# Patient Record
Sex: Female | Born: 1955 | Race: White | Hispanic: No | Marital: Married | Smoking: Never smoker
Health system: Southern US, Community
[De-identification: ages and names within clinical notes are randomized; demographics above are authoritative.]

## PROBLEM LIST (undated history)

## (undated) DIAGNOSIS — K5090 Crohn's disease, unspecified, without complications: Secondary | ICD-10-CM

## (undated) DIAGNOSIS — K7682 Hepatic encephalopathy: Secondary | ICD-10-CM

## (undated) DIAGNOSIS — I10 Essential (primary) hypertension: Secondary | ICD-10-CM

## (undated) DIAGNOSIS — E119 Type 2 diabetes mellitus without complications: Secondary | ICD-10-CM

## (undated) DIAGNOSIS — K259 Gastric ulcer, unspecified as acute or chronic, without hemorrhage or perforation: Secondary | ICD-10-CM

## (undated) DIAGNOSIS — K7290 Hepatic failure, unspecified without coma: Secondary | ICD-10-CM

## (undated) DIAGNOSIS — Z9289 Personal history of other medical treatment: Secondary | ICD-10-CM

## (undated) DIAGNOSIS — D509 Iron deficiency anemia, unspecified: Secondary | ICD-10-CM

## (undated) DIAGNOSIS — K7460 Unspecified cirrhosis of liver: Secondary | ICD-10-CM

## (undated) DIAGNOSIS — F329 Major depressive disorder, single episode, unspecified: Secondary | ICD-10-CM

## (undated) DIAGNOSIS — J90 Pleural effusion, not elsewhere classified: Secondary | ICD-10-CM

## (undated) DIAGNOSIS — F419 Anxiety disorder, unspecified: Secondary | ICD-10-CM

## (undated) DIAGNOSIS — M1990 Unspecified osteoarthritis, unspecified site: Secondary | ICD-10-CM

## (undated) DIAGNOSIS — H3530 Unspecified macular degeneration: Secondary | ICD-10-CM

## (undated) DIAGNOSIS — N3010 Interstitial cystitis (chronic) without hematuria: Secondary | ICD-10-CM

## (undated) DIAGNOSIS — F32A Depression, unspecified: Secondary | ICD-10-CM

## (undated) DIAGNOSIS — F319 Bipolar disorder, unspecified: Secondary | ICD-10-CM

## (undated) DIAGNOSIS — E785 Hyperlipidemia, unspecified: Secondary | ICD-10-CM

## (undated) DIAGNOSIS — G459 Transient cerebral ischemic attack, unspecified: Secondary | ICD-10-CM

## (undated) DIAGNOSIS — R011 Cardiac murmur, unspecified: Secondary | ICD-10-CM

## (undated) DIAGNOSIS — K769 Liver disease, unspecified: Secondary | ICD-10-CM

## (undated) DIAGNOSIS — IMO0002 Reserved for concepts with insufficient information to code with codable children: Secondary | ICD-10-CM

## (undated) DIAGNOSIS — J40 Bronchitis, not specified as acute or chronic: Secondary | ICD-10-CM

## (undated) DIAGNOSIS — I639 Cerebral infarction, unspecified: Secondary | ICD-10-CM

## (undated) DIAGNOSIS — K529 Noninfective gastroenteritis and colitis, unspecified: Secondary | ICD-10-CM

## (undated) DIAGNOSIS — K900 Celiac disease: Secondary | ICD-10-CM

## (undated) HISTORY — DX: Hepatic failure, unspecified without coma: K72.90

## (undated) HISTORY — DX: Unspecified cirrhosis of liver: K74.60

## (undated) HISTORY — PX: APPENDECTOMY: SHX54

## (undated) HISTORY — DX: Hyperlipidemia, unspecified: E78.5

## (undated) HISTORY — DX: Unspecified macular degeneration: H35.30

## (undated) HISTORY — DX: Cerebral infarction, unspecified: I63.9

## (undated) HISTORY — DX: Essential (primary) hypertension: I10

## (undated) HISTORY — DX: Reserved for concepts with insufficient information to code with codable children: IMO0002

## (undated) HISTORY — DX: Bipolar disorder, unspecified: F31.9

## (undated) HISTORY — DX: Crohn's disease, unspecified, without complications: K50.90

## (undated) HISTORY — DX: Liver disease, unspecified: K76.9

## (undated) HISTORY — PX: LUMBAR LAMINECTOMY: SHX95

## (undated) HISTORY — DX: Hepatic encephalopathy: K76.82

## (undated) HISTORY — PX: OTHER SURGICAL HISTORY: SHX169

## (undated) HISTORY — DX: Unspecified osteoarthritis, unspecified site: M19.90

## (undated) HISTORY — PX: BACK SURGERY: SHX140

## (undated) HISTORY — DX: Transient cerebral ischemic attack, unspecified: G45.9

## (undated) HISTORY — DX: Gastric ulcer, unspecified as acute or chronic, without hemorrhage or perforation: K25.9

## (undated) HISTORY — DX: Noninfective gastroenteritis and colitis, unspecified: K52.9

## (undated) HISTORY — DX: Celiac disease: K90.0

## (undated) HISTORY — PX: TONSILLECTOMY AND ADENOIDECTOMY: SUR1326

## (undated) HISTORY — PX: CARPAL TUNNEL RELEASE: SHX101

---

## 1981-06-30 HISTORY — PX: TUBAL LIGATION: SHX77

## 1997-10-16 ENCOUNTER — Encounter: Admission: RE | Admit: 1997-10-16 | Discharge: 1998-01-14 | Payer: Self-pay | Admitting: Family Medicine

## 1997-11-14 ENCOUNTER — Inpatient Hospital Stay (HOSPITAL_COMMUNITY): Admission: AD | Admit: 1997-11-14 | Discharge: 1997-11-16 | Payer: Self-pay | Admitting: Psychiatry

## 1997-11-17 ENCOUNTER — Encounter (HOSPITAL_COMMUNITY): Admission: RE | Admit: 1997-11-17 | Discharge: 1997-11-20 | Payer: Self-pay | Admitting: Psychiatry

## 1998-11-05 ENCOUNTER — Ambulatory Visit (HOSPITAL_COMMUNITY): Admission: RE | Admit: 1998-11-05 | Discharge: 1998-11-05 | Payer: Self-pay | Admitting: *Deleted

## 1999-04-03 ENCOUNTER — Encounter: Payer: Self-pay | Admitting: Emergency Medicine

## 1999-04-03 ENCOUNTER — Emergency Department (HOSPITAL_COMMUNITY): Admission: EM | Admit: 1999-04-03 | Discharge: 1999-04-03 | Payer: Self-pay | Admitting: Emergency Medicine

## 2000-02-11 ENCOUNTER — Other Ambulatory Visit: Admission: RE | Admit: 2000-02-11 | Discharge: 2000-02-11 | Payer: Self-pay | Admitting: Emergency Medicine

## 2001-02-17 ENCOUNTER — Encounter: Admission: RE | Admit: 2001-02-17 | Discharge: 2001-05-18 | Payer: Self-pay | Admitting: Family Medicine

## 2001-03-16 ENCOUNTER — Encounter: Payer: Self-pay | Admitting: Family Medicine

## 2001-03-16 ENCOUNTER — Ambulatory Visit (HOSPITAL_COMMUNITY): Admission: RE | Admit: 2001-03-16 | Discharge: 2001-03-16 | Payer: Self-pay | Admitting: Family Medicine

## 2001-06-09 ENCOUNTER — Ambulatory Visit (HOSPITAL_BASED_OUTPATIENT_CLINIC_OR_DEPARTMENT_OTHER): Admission: RE | Admit: 2001-06-09 | Discharge: 2001-06-09 | Payer: Self-pay | Admitting: Urology

## 2001-07-30 ENCOUNTER — Encounter: Admission: RE | Admit: 2001-07-30 | Discharge: 2001-07-30 | Payer: Self-pay | Admitting: *Deleted

## 2001-07-30 ENCOUNTER — Encounter: Payer: Self-pay | Admitting: *Deleted

## 2001-08-02 ENCOUNTER — Encounter: Payer: Self-pay | Admitting: Internal Medicine

## 2001-08-02 ENCOUNTER — Ambulatory Visit (HOSPITAL_COMMUNITY): Admission: RE | Admit: 2001-08-02 | Discharge: 2001-08-02 | Payer: Self-pay | Admitting: *Deleted

## 2002-02-10 ENCOUNTER — Emergency Department (HOSPITAL_COMMUNITY): Admission: EM | Admit: 2002-02-10 | Discharge: 2002-02-10 | Payer: Self-pay | Admitting: Emergency Medicine

## 2002-10-05 ENCOUNTER — Ambulatory Visit (HOSPITAL_COMMUNITY): Admission: RE | Admit: 2002-10-05 | Discharge: 2002-10-05 | Payer: Self-pay | Admitting: *Deleted

## 2002-10-05 ENCOUNTER — Encounter: Payer: Self-pay | Admitting: Internal Medicine

## 2002-10-05 ENCOUNTER — Encounter (INDEPENDENT_AMBULATORY_CARE_PROVIDER_SITE_OTHER): Payer: Self-pay | Admitting: *Deleted

## 2002-10-05 ENCOUNTER — Encounter (INDEPENDENT_AMBULATORY_CARE_PROVIDER_SITE_OTHER): Payer: Self-pay

## 2002-11-07 ENCOUNTER — Encounter: Admission: RE | Admit: 2002-11-07 | Discharge: 2003-02-05 | Payer: Self-pay | Admitting: *Deleted

## 2003-08-30 HISTORY — PX: OTHER SURGICAL HISTORY: SHX169

## 2003-10-13 ENCOUNTER — Encounter: Admission: RE | Admit: 2003-10-13 | Discharge: 2003-10-13 | Payer: Self-pay | Admitting: *Deleted

## 2003-11-07 ENCOUNTER — Other Ambulatory Visit: Admission: RE | Admit: 2003-11-07 | Discharge: 2003-11-07 | Payer: Self-pay | Admitting: Family Medicine

## 2004-04-19 ENCOUNTER — Encounter: Admission: RE | Admit: 2004-04-19 | Discharge: 2004-04-19 | Payer: Self-pay | Admitting: Family Medicine

## 2004-07-30 ENCOUNTER — Ambulatory Visit (HOSPITAL_COMMUNITY): Admission: RE | Admit: 2004-07-30 | Discharge: 2004-07-30 | Payer: Self-pay | Admitting: Orthopedic Surgery

## 2004-07-30 ENCOUNTER — Ambulatory Visit (HOSPITAL_BASED_OUTPATIENT_CLINIC_OR_DEPARTMENT_OTHER): Admission: RE | Admit: 2004-07-30 | Discharge: 2004-07-30 | Payer: Self-pay | Admitting: Orthopedic Surgery

## 2004-08-01 ENCOUNTER — Encounter (HOSPITAL_COMMUNITY): Admission: RE | Admit: 2004-08-01 | Discharge: 2004-10-30 | Payer: Self-pay | Admitting: *Deleted

## 2004-08-26 ENCOUNTER — Encounter (HOSPITAL_COMMUNITY): Admission: RE | Admit: 2004-08-26 | Discharge: 2004-08-30 | Payer: Self-pay | Admitting: *Deleted

## 2004-08-29 ENCOUNTER — Ambulatory Visit (HOSPITAL_BASED_OUTPATIENT_CLINIC_OR_DEPARTMENT_OTHER): Admission: RE | Admit: 2004-08-29 | Discharge: 2004-08-29 | Payer: Self-pay | Admitting: Orthopedic Surgery

## 2004-08-29 ENCOUNTER — Ambulatory Visit (HOSPITAL_COMMUNITY): Admission: RE | Admit: 2004-08-29 | Discharge: 2004-08-29 | Payer: Self-pay | Admitting: Orthopedic Surgery

## 2004-11-26 ENCOUNTER — Ambulatory Visit: Payer: Self-pay | Admitting: Oncology

## 2004-12-13 ENCOUNTER — Encounter: Admission: RE | Admit: 2004-12-13 | Discharge: 2004-12-13 | Payer: Self-pay | Admitting: *Deleted

## 2004-12-13 ENCOUNTER — Encounter (INDEPENDENT_AMBULATORY_CARE_PROVIDER_SITE_OTHER): Payer: Self-pay | Admitting: *Deleted

## 2005-01-29 ENCOUNTER — Ambulatory Visit: Payer: Self-pay | Admitting: Internal Medicine

## 2005-02-03 ENCOUNTER — Encounter (INDEPENDENT_AMBULATORY_CARE_PROVIDER_SITE_OTHER): Payer: Self-pay | Admitting: *Deleted

## 2005-02-03 ENCOUNTER — Ambulatory Visit (HOSPITAL_COMMUNITY): Admission: RE | Admit: 2005-02-03 | Discharge: 2005-02-03 | Payer: Self-pay | Admitting: Internal Medicine

## 2005-02-12 ENCOUNTER — Ambulatory Visit: Payer: Self-pay | Admitting: Internal Medicine

## 2005-02-18 ENCOUNTER — Ambulatory Visit: Payer: Self-pay | Admitting: Internal Medicine

## 2005-02-18 ENCOUNTER — Ambulatory Visit (HOSPITAL_COMMUNITY): Admission: RE | Admit: 2005-02-18 | Discharge: 2005-02-18 | Payer: Self-pay | Admitting: Internal Medicine

## 2005-02-27 ENCOUNTER — Ambulatory Visit: Payer: Self-pay | Admitting: Oncology

## 2005-04-25 ENCOUNTER — Ambulatory Visit: Payer: Self-pay | Admitting: Oncology

## 2006-04-22 ENCOUNTER — Encounter: Admission: RE | Admit: 2006-04-22 | Discharge: 2006-04-22 | Payer: Self-pay | Admitting: Internal Medicine

## 2007-03-07 ENCOUNTER — Emergency Department (HOSPITAL_COMMUNITY): Admission: EM | Admit: 2007-03-07 | Discharge: 2007-03-07 | Payer: Self-pay | Admitting: Physician Assistant

## 2007-04-14 ENCOUNTER — Ambulatory Visit: Payer: Self-pay | Admitting: Internal Medicine

## 2007-04-17 ENCOUNTER — Ambulatory Visit (HOSPITAL_COMMUNITY): Admission: RE | Admit: 2007-04-17 | Discharge: 2007-04-17 | Payer: Self-pay | Admitting: Internal Medicine

## 2007-04-21 ENCOUNTER — Encounter: Payer: Self-pay | Admitting: Internal Medicine

## 2007-04-21 ENCOUNTER — Encounter (INDEPENDENT_AMBULATORY_CARE_PROVIDER_SITE_OTHER): Payer: Self-pay | Admitting: *Deleted

## 2007-04-21 ENCOUNTER — Ambulatory Visit: Payer: Self-pay | Admitting: Internal Medicine

## 2007-04-30 ENCOUNTER — Ambulatory Visit: Payer: Self-pay | Admitting: Oncology

## 2007-04-30 LAB — CBC WITH DIFFERENTIAL/PLATELET
BASO%: 0.8 % (ref 0.0–2.0)
EOS%: 3.6 % (ref 0.0–7.0)
HCT: 29.8 % — ABNORMAL LOW (ref 34.8–46.6)
MCH: 25.8 pg — ABNORMAL LOW (ref 26.0–34.0)
MCHC: 33.6 g/dL (ref 32.0–36.0)
NEUT%: 42.2 % (ref 39.6–76.8)
RBC: 3.88 10*6/uL (ref 3.70–5.32)
lymph#: 1.1 10*3/uL (ref 0.9–3.3)

## 2007-04-30 LAB — PROTHROMBIN TIME: INR: 1.1 (ref 0.0–1.5)

## 2007-05-05 LAB — COMPREHENSIVE METABOLIC PANEL
CO2: 27 mEq/L (ref 19–32)
Creatinine, Ser: 0.65 mg/dL (ref 0.40–1.20)
Glucose, Bld: 119 mg/dL — ABNORMAL HIGH (ref 70–99)
Total Bilirubin: 0.8 mg/dL (ref 0.3–1.2)

## 2007-05-05 LAB — VON WILLEBRAND FACTOR MULTIMER
Factor-VIII Activity: 117 % (ref 75–150)
Ristocetin-Cofactor: 146 % (ref 50–150)
Von Willebrand Multimers: NORMAL

## 2007-05-05 LAB — TRANSFERRIN RECEPTOR, SOLUABLE: Transferrin Receptor, Soluble: 46.6 nmol/L

## 2007-05-05 LAB — LACTATE DEHYDROGENASE: LDH: 114 U/L (ref 94–250)

## 2007-05-17 ENCOUNTER — Ambulatory Visit: Payer: Self-pay | Admitting: Internal Medicine

## 2007-05-18 ENCOUNTER — Encounter: Admission: RE | Admit: 2007-05-18 | Discharge: 2007-05-18 | Payer: Self-pay | Admitting: Internal Medicine

## 2007-06-16 ENCOUNTER — Ambulatory Visit: Payer: Self-pay | Admitting: Oncology

## 2007-06-18 LAB — COMPREHENSIVE METABOLIC PANEL
ALT: 25 U/L (ref 0–35)
Albumin: 4.1 g/dL (ref 3.5–5.2)
BUN: 13 mg/dL (ref 6–23)
CO2: 26 mEq/L (ref 19–32)
Calcium: 9.2 mg/dL (ref 8.4–10.5)
Chloride: 106 mEq/L (ref 96–112)
Creatinine, Ser: 0.62 mg/dL (ref 0.40–1.20)

## 2007-06-18 LAB — CBC WITH DIFFERENTIAL/PLATELET
BASO%: 0.8 % (ref 0.0–2.0)
Basophils Absolute: 0 10*3/uL (ref 0.0–0.1)
HCT: 37.2 % (ref 34.8–46.6)
HGB: 12.5 g/dL (ref 11.6–15.9)
MONO#: 0.4 10*3/uL (ref 0.1–0.9)
NEUT%: 55 % (ref 39.6–76.8)
WBC: 3.7 10*3/uL — ABNORMAL LOW (ref 3.9–10.0)
lymph#: 1.1 10*3/uL (ref 0.9–3.3)

## 2007-06-18 LAB — FERRITIN: Ferritin: 299 ng/mL — ABNORMAL HIGH (ref 10–291)

## 2007-06-18 LAB — IRON AND TIBC
%SAT: 20 % (ref 20–55)
Iron: 64 ug/dL (ref 42–145)
TIBC: 321 ug/dL (ref 250–470)
UIBC: 257 ug/dL

## 2007-06-18 LAB — LACTATE DEHYDROGENASE: LDH: 122 U/L (ref 94–250)

## 2007-08-03 ENCOUNTER — Encounter: Payer: Self-pay | Admitting: Internal Medicine

## 2007-08-04 DIAGNOSIS — M129 Arthropathy, unspecified: Secondary | ICD-10-CM | POA: Insufficient documentation

## 2007-08-04 DIAGNOSIS — IMO0002 Reserved for concepts with insufficient information to code with codable children: Secondary | ICD-10-CM

## 2007-08-04 DIAGNOSIS — E785 Hyperlipidemia, unspecified: Secondary | ICD-10-CM

## 2007-08-04 DIAGNOSIS — K900 Celiac disease: Secondary | ICD-10-CM

## 2007-08-04 DIAGNOSIS — F341 Dysthymic disorder: Secondary | ICD-10-CM

## 2007-08-04 DIAGNOSIS — I10 Essential (primary) hypertension: Secondary | ICD-10-CM

## 2007-08-04 DIAGNOSIS — E119 Type 2 diabetes mellitus without complications: Secondary | ICD-10-CM

## 2007-08-04 DIAGNOSIS — J309 Allergic rhinitis, unspecified: Secondary | ICD-10-CM | POA: Insufficient documentation

## 2007-08-04 DIAGNOSIS — R197 Diarrhea, unspecified: Secondary | ICD-10-CM | POA: Insufficient documentation

## 2007-08-04 DIAGNOSIS — K5289 Other specified noninfective gastroenteritis and colitis: Secondary | ICD-10-CM | POA: Insufficient documentation

## 2007-10-14 ENCOUNTER — Other Ambulatory Visit: Admission: RE | Admit: 2007-10-14 | Discharge: 2007-10-14 | Payer: Self-pay | Admitting: Obstetrics and Gynecology

## 2007-10-25 ENCOUNTER — Ambulatory Visit: Payer: Self-pay | Admitting: Internal Medicine

## 2008-01-25 ENCOUNTER — Ambulatory Visit (HOSPITAL_BASED_OUTPATIENT_CLINIC_OR_DEPARTMENT_OTHER): Admission: RE | Admit: 2008-01-25 | Discharge: 2008-01-26 | Payer: Self-pay | Admitting: Orthopedic Surgery

## 2008-01-25 HISTORY — PX: OTHER SURGICAL HISTORY: SHX169

## 2008-07-03 ENCOUNTER — Ambulatory Visit (HOSPITAL_BASED_OUTPATIENT_CLINIC_OR_DEPARTMENT_OTHER): Admission: RE | Admit: 2008-07-03 | Discharge: 2008-07-03 | Payer: Self-pay | Admitting: Urology

## 2009-01-10 ENCOUNTER — Ambulatory Visit: Payer: Self-pay | Admitting: Internal Medicine

## 2009-01-10 DIAGNOSIS — K5010 Crohn's disease of large intestine without complications: Secondary | ICD-10-CM | POA: Insufficient documentation

## 2009-02-02 ENCOUNTER — Encounter: Payer: Self-pay | Admitting: Internal Medicine

## 2010-02-15 ENCOUNTER — Telehealth: Payer: Self-pay | Admitting: Internal Medicine

## 2010-03-21 ENCOUNTER — Ambulatory Visit: Payer: Self-pay | Admitting: Internal Medicine

## 2010-03-22 LAB — CONVERTED CEMR LAB
ALT: 57 units/L — ABNORMAL HIGH (ref 0–35)
Basophils Absolute: 0 10*3/uL (ref 0.0–0.1)
Basophils Relative: 0.3 % (ref 0.0–3.0)
Calcium: 9.2 mg/dL (ref 8.4–10.5)
Chloride: 103 meq/L (ref 96–112)
GFR calc non Af Amer: 116.73 mL/min (ref >60)
Glucose, Bld: 146 mg/dL — ABNORMAL HIGH (ref 70–99)
Hemoglobin: 13.3 g/dL (ref 12.0–15.0)
Lymphs Abs: 1.3 10*3/uL (ref 0.7–4.0)
MCHC: 34.5 g/dL (ref 30.0–36.0)
Monocytes Relative: 7.5 % (ref 3.0–12.0)
Neutro Abs: 1.5 10*3/uL (ref 1.4–7.7)
Platelets: 128 10*3/uL — ABNORMAL LOW (ref 150.0–400.0)
Potassium: 3.5 meq/L (ref 3.5–5.1)
RDW: 14 % (ref 11.5–14.6)
TSH: 3.5 microintl units/mL (ref 0.35–5.50)
Total Bilirubin: 1.1 mg/dL (ref 0.3–1.2)

## 2010-07-09 ENCOUNTER — Ambulatory Visit
Admission: RE | Admit: 2010-07-09 | Discharge: 2010-07-09 | Payer: Self-pay | Source: Home / Self Care | Attending: Family Medicine | Admitting: Family Medicine

## 2010-07-09 DIAGNOSIS — N3010 Interstitial cystitis (chronic) without hematuria: Secondary | ICD-10-CM | POA: Insufficient documentation

## 2010-07-09 DIAGNOSIS — H3530 Unspecified macular degeneration: Secondary | ICD-10-CM | POA: Insufficient documentation

## 2010-07-09 DIAGNOSIS — F319 Bipolar disorder, unspecified: Secondary | ICD-10-CM | POA: Insufficient documentation

## 2010-08-01 NOTE — Progress Notes (Signed)
Summary: Medication refill   Phone Note Call from Patient Call back at Home Phone [phone]   Caller: Patient Call For: Dr. Marina Goodell Reason for Call: Refill Medication Summary of Call: Need refill on her Lialda....mail order  appt. sch'd for 03-21-10 Initial call taken by: Karna Christmas,  February 15, 2010 10:01 AM  Follow-up for Phone Call        Rx. printed and faxed to mail order.   Follow-up by: Milford Cage NCMA,  February 15, 2010 10:16 AM    Prescriptions: LIALDA 1.2 GM TBEC (MESALAMINE) two times a day  #180 x 4   Entered by:   Milford Cage NCMA   Authorized by:   Hilarie Fredrickson MD   Signed by:   Milford Cage NCMA on 02/15/2010   Method used:   Printed then faxed to ...       [facility] (mail-order)             , [place]         Ph:        Fax: [phone]   RxID:   [id number]

## 2010-08-01 NOTE — Assessment & Plan Note (Signed)
Summary: new to estab///sph   Vital Signs:  Patient profile:   55 year old female Height:      64 inches Weight:      206 pounds BMI:     35.49 Pulse rate:   80 / minute BP sitting:   130 / 76  (left arm)  Vitals Entered By: Doristine Devoid CMA (July 09, 2010 10:21 AM) CC: NEW EST- problems w/ sinus congestion    History of Present Illness: 55 yo woman here today to establish care.  Previous MD- Dr Adonis Housekeeper.  GI- Marina Goodell.  overdue on mammogram, UTD on pap.  Crohn's Dz- follows w/ Dr Marina Goodell.  on lialda.  Celiac Dz- admits to poor dietary compliance.  pt feels the Lialda also helps this.  following w/ Dr Marina Goodell.  Hyperlipidemia- not currently on meds.  has never been on meds.  labs last checked 'a couple of years' ago.  HTN- on HCTZ.  no CP, SOB, HAs, visual changes, edema.  Macular Degeneration- following w/ Dr Mitzi Davenport.  Bipolar Dz- Dr Ihor Austin manages.  on abilify, prozac, lamictal, ambien, lorazepam, trazodone.  Interstitial cystitis- following w/ Dr Isabel Caprice, on Elmiron.  Allergies- on Allegra and Flonase.  has been off allegra for awhile and has been trying other things w/out success.  will be going back to allegra.    Preventive Screening-Counseling & Management  Alcohol-Tobacco     Alcohol drinks/day: 0     Smoking Status: never  Caffeine-Diet-Exercise     Does Patient Exercise: no      Sexual History:  currently monogamous.        Drug Use:  never.    Current Medications (verified): 1)  Lialda 1.2 Gm Tbec (Mesalamine) .... Two Times A Day 2)  Poly-Iron 150 150 Mg Caps (Polysaccharide Iron Complex) .... Two Times A Day 3)  Lamictal 200 Mg Tabs (Lamotrigine) .... One Tablet By Mouth At Bedtime 4)  Ambien 10 Mg Tabs (Zolpidem Tartrate) .... One Tablet By Mouth At Bedtime 5)  Trazodone Hcl 50 Mg Tabs (Trazodone Hcl) .... 1/2 Tablet By Mouth At Bedtime 6)  Lorazepam 0.5 Mg Tabs (Lorazepam) .... One Tablet By Mouth Two Times A Day 7)  Prozac 40 Mg Caps (Fluoxetine  Hcl) .... One Capsule By Mouth Once Daily 8)  Flonase 50 Mcg/act Susp (Fluticasone Propionate) .... As Directed Once Daily 9)  Fexofenadine Hcl 180 Mg Tabs (Fexofenadine Hcl) .... One Tablet By Mouth Once Daily 10)  Hydrochlorothiazide 25 Mg Tabs (Hydrochlorothiazide) .... One Tablet By Mouth Once Daily 11)  Womens Multivitamin Plus  Tabs (Multiple Vitamins-Minerals) .... Take One Tablet Daily 12)  Osteo Bi-Flex Adv Double St  Tabs (Misc Natural Products) .... Take One Tablet Daily 13)  Abilify 10 Mg Tabs (Aripiprazole) .... Take One Tablet Daily 14)  Elmiron 100 Mg Caps (Pentosan Polysulfate Sodium) .... Take One Tablet Two Times A Day  Allergies (verified): 1)  ! Sulfa  Past History:  Past medical, surgical, family and social histories (including risk factors) reviewed, and no changes noted (except as noted below).  Past Medical History: Diarrhea (ICD-787.91) Arthritis (ICD-716.90) Allergic Rhinitis (ICD-477.9) Colitis (ICD-558.9) Degenerative Disc Disease (ICD-722.6) Hyperlipidemia (ICD-272.4) Hypertension (ICD-401.9) Celiac Disease (ICD-579.0) Bipolar Disease Macular degeneration  Past Surgical History: Tubal ligation Appendectomy Lumbar Laminectomy Carpal Tunnel release bilateral Rotator cuff tear repair bilateral Caesarean section Tonsillectomy  Family History: Reviewed history from 01/10/2009 and no changes required. No FH of Colon Cancer: Family History of Diabetes: Mother and brother  Family History Hypertension  no family hx of breast cancer  Social History: Reviewed history from 01/10/2009 and no changes required. 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  Alcohol Use - no Illicit Drug Use - no Daily Caffeine Use: 5 cans of coke daily  Patient does not get regular exercise.  Drug Use:  never Sexual History:  currently monogamous  Review of Systems      See  HPI  Physical Exam  General:  Well developed, well nourished, no acute distress. Head:  Normocephalic and atraumatic. Neck:  Supple; no masses or thyromegaly. Lungs:  Normal respiratory effort, chest expands symmetrically. Lungs are clear to auscultation, no crackles or wheezes. Heart:  reg S1/S2, I/VI SEM Abdomen:  soft, NT/ND, +BS Pulses:  +2 carotid, radial, DP Extremities:  no C/C/E Neurologic:  alert & oriented X3, cranial nerves II-XII intact, gait normal, and DTRs symmetrical and normal.   Skin:  turgor normal and color normal.   Cervical Nodes:  No lymphadenopathy noted Psych:  Oriented X3, memory intact for recent and remote, normally interactive, good eye contact, not anxious appearing, and not depressed appearing.     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  follows w/ Dr Marina Goodell.  Problem # 3:  HYPERLIPIDEMIA (ICD-272.4) last had labs checked 'years ago'.  not on meds.  not fasting today.  will have pt return for fasting labs.  Problem # 4:  HYPERTENSION (ICD-401.9) adequate control today.  asymptomatic.  no changes at this time. Her updated medication list for this problem includes:    Hydrochlorothiazide 25 Mg Tabs (Hydrochlorothiazide) ..... One tablet by mouth once daily  Problem # 5:  MACULAR DEGENERATION (ICD-362.50) Assessment: New following w/ Dr Mitzi Davenport  Problem # 6:  BIPOLAR AFFECTIVE DISORDER (ICD-296.80) Assessment: New follows w/ Dr Ihor Austin.  sxs well controlled.  Problem # 7:  INTERSTITIAL CYSTITIS (ICD-595.1) Assessment: New follows w/ Dr Isabel Caprice.  on Elmiron.  Problem # 8:  ALLERGIC RHINITIS (ICD-477.9) Assessment: Comment Only refill provided on Flonase.  pt will return to Allegra as other OTC meds have not provided same degree of sxs relief. Her updated medication list for this problem  includes:    Flonase 50 Mcg/act Susp (Fluticasone propionate) .Marland Kitchen... As directed once daily    Fexofenadine Hcl 180 Mg Tabs (Fexofenadine hcl) ..... One tablet by mouth once daily  Complete Medication List: 1)  Lialda 1.2 Gm Tbec (Mesalamine) .... Two times a day 2)  Poly-iron 150 150 Mg Caps (Polysaccharide iron complex) .... Two times a day 3)  Lamictal 200 Mg Tabs (Lamotrigine) .... One tablet by mouth at bedtime 4)  Ambien 10 Mg Tabs (Zolpidem tartrate) .... One tablet by mouth at bedtime 5)  Trazodone Hcl 50 Mg Tabs (Trazodone hcl) .... 1/2 tablet by mouth at bedtime 6)  Lorazepam 0.5 Mg Tabs (Lorazepam) .... One tablet by mouth two times a day 7)  Prozac 40 Mg Caps (Fluoxetine hcl) .... One capsule by mouth once daily 8)  Flonase 50 Mcg/act Susp (Fluticasone propionate) .... As directed once daily 9)  Fexofenadine Hcl 180 Mg Tabs (Fexofenadine hcl) .... One tablet by mouth once daily 10)  Hydrochlorothiazide 25 Mg Tabs (Hydrochlorothiazide) .... One tablet by mouth once daily 11)  Womens Multivitamin Plus Tabs (Multiple vitamins-minerals) .... Take one tablet daily 12)  Osteo Bi-flex  Adv Double St Tabs (Misc natural products) .... Take one tablet daily 13)  Abilify 10 Mg Tabs (Aripiprazole) .... Take one tablet daily 14)  Elmiron 100 Mg Caps (Pentosan polysulfate sodium) .... Take one tablet two times a day  Patient Instructions: 1)  Please schedule your complete physical at your convenience- do not eat before this appt 2)  Continue your current meds 3)  Please call with any questions or concerns 4)  Welcome!  We're glad to have you!!! Prescriptions: HYDROCHLOROTHIAZIDE 25 MG TABS (HYDROCHLOROTHIAZIDE) one tablet by mouth once daily  #90 x 3   Entered and Authorized by:   Neena Rhymes MD   Signed by:   Neena Rhymes MD on 07/09/2010   Method used:   Electronically to        [facility] (retail)       [address]       [place], [place]  [address]       Ph: [phone]       Fax: [phone]   RxID:   [id number] FLONASE 50 MCG/ACT SUSP (FLUTICASONE PROPIONATE) as directed once daily  #3 months x 3   Entered and Authorized by:   Neena Rhymes MD   Signed by:   Neena Rhymes MD on 07/09/2010   Method used:   Electronically to        [facility] (retail)       [address]       [place], [place]  [address]       Ph: [phone]       Fax: [phone]   RxID:   [id number] POLY-IRON 150 150 MG CAPS (POLYSACCHARIDE IRON COMPLEX) two times a day  #180 x 3   Entered and Authorized by:   Neena Rhymes MD   Signed by:   Neena Rhymes MD on 07/09/2010   Method used:   Electronically to        [facility] (retail)       [address]       [place], [place]  [address]       Ph: [phone]       Fax: [phone]   RxID:   [id number] POLY-IRON 150 150 MG CAPS (POLYSACCHARIDE IRON COMPLEX) two times a day  #60 x 6   Entered and Authorized by:   Neena Rhymes MD   Signed by:   Neena Rhymes MD on 07/09/2010   Method used:   Electronically to        [facility] (retail)       [address]       [place], [place]  [address]       Ph: [phone]       Fax: [phone]   RxID:   [id number] FLONASE 50 MCG/ACT SUSP (FLUTICASONE PROPIONATE) as directed once daily  #1 x 3   Entered and Authorized by:   Neena Rhymes MD   Signed by:   Neena Rhymes MD on 07/09/2010   Method used:   Electronically to        [facility] (retail)       [address]       [place], [place]  [address]  Ph: [phone]       Fax: [phone]   RxID:   [id number] HYDROCHLOROTHIAZIDE 25 MG TABS (HYDROCHLOROTHIAZIDE) one tablet by mouth once daily  #30 x 6   Entered and Authorized by:   Neena Rhymes MD   Signed by:   Neena Rhymes MD on 07/09/2010   Method used:   Electronically to        [facility] (retail)       [address]       [place], [place]  [address]       Ph: [phone]       Fax: [phone]   RxID:   [id number]    Orders Added: 1)  New Patient Level III [35573]

## 2010-08-01 NOTE — Assessment & Plan Note (Signed)
Summary: Annual followup (celiac disease, Crohn's colitis)    History of Present Illness Visit Type: Follow-up Visit Primary GI MD: [name] MD Primary Provider: Doreatha Lew, MD Requesting Provider: n/a Chief Complaint: Yearly check-up, no problems History of Present Illness:   55 year old female for history of mild Crohn's colitis, hyperlipidemia, diabetes mellitus, anxiety/depression, hypertension, and celiac sprue. She presents today for routine followup. She was last evaluated July 2010. For her colitis she is maintained on Lialda 1.2 g b.i.d. She states that she has been compliant with her gluten-free diet about 75% of the time. She denies any GI symptoms. She has not had routine blood work in over one year. Review of outside blood work from August 2010 reveals normal liver function tests. Normal hemoglobin of 14.1. Abnormal Shaunette Gassner blood cell count and platelets of 2.8 and 112 respectively. She has gained significant weight since her last visit. Over 30 pounds.   GI Review of Systems      Denies abdominal pain, acid reflux, belching, bloating, chest pain, dysphagia with liquids, dysphagia with solids, heartburn, loss of appetite, nausea, vomiting, vomiting blood, weight loss, and  weight gain.        Denies anal fissure, black tarry stools, change in bowel habit, constipation, diarrhea, diverticulosis, fecal incontinence, heme positive stool, hemorrhoids, irritable bowel syndrome, jaundice, light color stool, liver problems, rectal bleeding, and  rectal pain.    Current Medications (verified): 1)  Lialda 1.2 Gm Tbec (Mesalamine) .... Two Times A Day 2)  Poly-Iron 150 150 Mg Caps (Polysaccharide Iron Complex) .... Two Times A Day 3)  Lamictal 200 Mg Tabs (Lamotrigine) .... One Tablet By Mouth At Bedtime 4)  Ambien 10 Mg Tabs (Zolpidem Tartrate) .... One Tablet By Mouth At Bedtime 5)  Trazodone Hcl 50 Mg Tabs (Trazodone Hcl) .... 1/2 Tablet By Mouth At Bedtime 6)  Lorazepam 0.5  Mg Tabs (Lorazepam) .... One Tablet By Mouth Two Times A Day 7)  Prozac 40 Mg Caps (Fluoxetine Hcl) .... One Capsule By Mouth Once Daily 8)  Flonase 50 Mcg/act Susp (Fluticasone Propionate) .... As Directed Once Daily 9)  Fexofenadine Hcl 180 Mg Tabs (Fexofenadine Hcl) .... One Tablet By Mouth Once Daily 10)  Hydrochlorothiazide 25 Mg Tabs (Hydrochlorothiazide) .... One Tablet By Mouth Once Daily  Allergies (verified): 1)  ! Sulfa  Past History:  Past Medical History: Reviewed history from 08/04/2007 and no changes required. Current Problems:  DIARRHEA (ICD-787.91) ARTHRITIS (ICD-716.90) ALLERGIC RHINITIS (ICD-477.9) COLITIS (ICD-558.9) DEGENERATIVE DISC DISEASE (ICD-722.6) ANXIETY DEPRESSION (ICD-300.4) HYPERLIPIDEMIA (ICD-272.4) DIABETES MELLITUS (ICD-250.00) HYPERTENSION (ICD-401.9) CELIAC DISEASE (ICD-579.0)  Past Surgical History: Reviewed history from 08/04/2007 and no changes required. Tubal ligation Appendectomy  Family History: Reviewed history from 01/10/2009 and no changes required. No FH of Colon Cancer: Family History of Diabetes: Mother and brother   Social History: Reviewed history from 01/10/2009 and no changes required. Occupation: [employer]  Patient has never smoked.  Alcohol Use - no Illicit Drug Use - no Daily Caffeine Use: 5 cans of coke daily  Patient does not get regular exercise.   Review of Systems  The patient denies allergy/sinus, anemia, anxiety-new, arthritis/joint pain, back pain, blood in urine, breast changes/lumps, change in vision, confusion, cough, coughing up blood, depression-new, fainting, fatigue, fever, headaches-new, hearing problems, heart murmur, heart rhythm changes, itching, menstrual pain, muscle pains/cramps, night sweats, nosebleeds, pregnancy symptoms, shortness of breath, skin rash, sleeping problems, sore throat, swelling of feet/legs, swollen lymph glands, thirst - excessive , urination - excessive ,  urination  changes/pain, urine leakage, vision changes, and voice change.    Vital Signs:  Patient profile:   55 year old female Height:      64 inches Weight:      203 pounds BMI:     34.97 Pulse rate:   64 / minute Pulse rhythm:   regular BP sitting:   106 / 70  (left arm) Cuff size:   regular  Vitals Entered By: June McMurray CMA Duncan Dull) (March 21, 2010 9:29 AM)  Physical Exam  General:  Well developed, well nourished, no acute distress. Head:  Normocephalic and atraumatic. Eyes:  PERRLA, no icterus. Mouth:  No deformity or lesions, dentition normal. Neck:  Supple; no masses or thyromegaly. Lungs:  Clear throughout to auscultation. Heart:  Regular rate and rhythm; no murmurs, rubs,  or bruits. Abdomen:  Soft,obese, nontender and nondistended. No masses, hepatosplenomegaly or hernias noted. Normal bowel sounds. Msk:  Symmetrical with no gross deformities. Normal posture. Pulses:  Normal pulses noted. Extremities:  No clubbing, cyanosis, edema or deformities noted. Neurologic:  Alert and  oriented x4;  grossly normal neurologically. Skin:  Intact without significant lesions or rashes. Psych:  Alert and cooperative. Normal mood and affect.   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 Currently asymptomatic on mesalamine.  Plan: #1. Continue mesalamine #2. Routine followup one year #3. CBC and comprehensive metabolic panel  Problem # 2:  CELIAC DISEASE (ICD-579.0) celiac disease. Currently asymptomatic. Incomplete gluten avoidance.  Plan: #1. Tissue transglutaminase antibody  #2. Routine followup one year.  Other Orders: T-Tissue Transglutamase Ab IgA [phone]) TLB-CBC Platelet - w/Differential (85025-CBCD) TLB-CMP (Comprehensive Metabolic Pnl) (80053-COMP) TLB-TSH (Thyroid Stimulating Hormone) (30865-HQI)  Patient Instructions: 1)  Please schedule a follow-up appointment in 1 year. 2)   Please go to the basement floor of our office before leaving today to have your tissue transglutamase, CBC, CMET and TSH drawn. 3)  Copy sent to : Dr Doreatha Lew 4)  The medication list was reviewed and reconciled.  All changed / newly prescribed medications were explained.  A complete medication list was provided to the patient / caregiver.

## 2010-10-14 LAB — POCT I-STAT 4, (NA,K, GLUC, HGB,HCT)
HCT: 46 % (ref 36.0–46.0)
Potassium: 3.8 mEq/L (ref 3.5–5.1)
Sodium: 141 mEq/L (ref 135–145)

## 2010-11-05 ENCOUNTER — Other Ambulatory Visit: Payer: Self-pay | Admitting: Family Medicine

## 2010-11-05 ENCOUNTER — Encounter: Payer: Self-pay | Admitting: Family Medicine

## 2010-11-12 NOTE — Op Note (Signed)
NAME:  Sabrina [name], Sabrina [name]            ACCOUNT NO.:  [id number]   MEDICAL RECORD NO.:  [id number]          PATIENT TYPE:  AMB   LOCATION:  [facility]                          FACILITY:  [facility]   PHYSICIAN:  Katy Fitch. Sypher, M.D. DATE OF BIRTH:  May 03, 1956   DATE OF PROCEDURE:  01/25/2008  DATE OF DISCHARGE:  01/26/2008                               OPERATIVE REPORT   PREOPERATIVE DIAGNOSIS:  Chronic pain left shoulder with MRI evidence of  a full-thickness rotator cuff tear involving the anterior supraspinatus  with cystic degenerative bone loss at greater tuberosity deep to rotator  cuff pathology and mildly unfavorable acromioclavicular and acromial  anatomy.   POSTOPERATIVE DIAGNOSIS:  Significant adhesive capsulitis left shoulder  with considerable intraarticular granulation tissue and capsular  adhesions, limiting range of motion, as well as full-thickness rotator  cuff tear and moderate hypertrophy of acromioclavicular joint capsule.   OPERATION:  1. Examination of left shoulder under anesthesia followed by      arthroscopic capsulectomy with debridement of granulation tissue      and adhesions of anterior glenohumeral ligaments with restoration      of rotator interval.  2. Arthroscopic subacromial decompression with resection of inferior      acromioclavicular capsule and limited debridement of acromion and      inferior clavicle.  3. Arthroscopic repair of supraspinatus rotator cuff tear utilizing a      speed bridge technique.   OPERATING SURGEON:  Katy Fitch. Sypher, MD.   ASSISTANT:  Dasnoit Marveen Reeks Dasnoit, PA.   ANESTHESIA:  General by endotracheal technique supplemented by a left  interscalene block.   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  She was  referred through the courtesy of Dr. Doreatha Lew for evaluation  and  management of a painful left shoulder.  Clinical examination  revealed signs of impingement, probable rotator cuff tear, and a degree  of adhesive capsulitis.   An MRI was obtained by Dr. Adonis Housekeeper prior to referral, which  demonstrated mild AC degenerative change, some signs of capsulitis, and  a probable full-thickness rotator cuff tear involving the supraspinatus  peripherally.  Ms. Melick was noted to have a significant degenerative  cyst at the greater tuberosity.   Detailed examination in the office confirmed a degree of adhesive  capsulitis as well as signs of a probable rotator cuff tear.  Arrangements were made for examination of her shoulder at this time  under anesthesia anticipating possible manipulation and capsulectomy  followed by subacromial decompression, possible distal clavicle  resection, and either open or arthroscopic repair of the rotator cuff.   Preoperatively, Ms. Bergevin was interviewed by Dr. Sampson Goon and had  an interscalene block placed in holding area.  She was subsequently  transferred to room 6 of the [facility], placed in supine  position on the operating table, and under Dr. Jarrett Ables direct  supervision, general endotracheal anesthesia induced.   She was carefully positioned in the beach chair position with the aid of  a  torso and head holder designed for arthroscopy.  The entire left  shoulder and forequarter prepped with DuraPrep and draped with  impervious arthroscopy drapes.   The procedure commenced with examination of the left shoulder under  anesthesia.  Her combined elevation was 140 degrees, external rotation  50 degrees at 90 degrees abduction, and internal rotation 20 degrees.  With gentle manipulation, capsular adhesions were released and her  flexion contracture was completely resolved with combined elevation to  175 degrees, external rotation at 90 degrees, abduction at 90 degrees,  and internal rotation at 90  degrees, adduction to 70 degrees.   The scope was introduced through a standard posterior portal followed by  debridement of adhesive capsulitis granulations and an anterior  capsulectomy with removal of adhesions from the anterior glenohumeral  ligaments as well as restoration of the rotator interval.  The long head  of the biceps had stable origin at the superior labrum.  There were  minor degenerative labral changes that were debrided with a suction  shaver brought in through an anterior portal.  The deep surface of the  rotator cuff was inspected.  There was a full-thickness tear of the  supraspinatus with extensive hemorrhagic tendinopathy.  The  infraspinatus and teres minor had normal-appearing tendon.  The hyaline  articular cartilage surfaces of the glenoid and humeral head were  intact.   A lateral portal was created and the tear was debrided with a suction  shaver followed by decortication of the greater tuberosity through the  lateral portal.   With the scope in both the subacromial space and within the joint, two  swivel lock anchors were placed with fiber tape at the anterior and  posterior limits of the tear at the articular margin.  With aid of a  scorpion suture passer, a speed bridge suture technique was used to  inset the supraspinatus into an anatomic footprint.  Swivel locks were  used laterally to inset the sutures.  A very satisfactory repair was  achieved.  A dog ear was attempted to be repaired posteriorly with a #2  FiberWire, however, due to the presence of some calcific tendinitis and  the challenges of getting precise suture placement, I ultimately elected  to abort a mattress suture at this area.   The arthroscopic equipment was removed from the glenohumeral joint and  the subacromial space was inspected for bleeding points, which were  cauterized with bipolar current followed by further debridement of the  [facility] joint capsule, partial release of  coracoacromial ligament and limited  debridement of the deep surface of the distal clavicle.  After  hemostasis was achieved, the arthroscopic equipment was removed and the  portal was repaired with intradermal 3-0 Prolene.   Dressings were applied with sterile gauze over the portal sites followed  by paper tape.  Ms. Domingos will be admitted to the recovery care  center for observation of vital signs as well as continuation of her  routine medications.  We anticipate use of p.o. and IV Dilaudid as well  as possible use of a PCA morphine protocol.   We anticipate discharge in 24 hours.     Katy Fitch Sypher, M.D.  Electronically Signed    RVS/MEDQ  D:  01/25/2008  T:  01/26/2008  Job:  [id number]   cc:   Duwayne Heck L. Mahaffey, M.D.

## 2010-11-12 NOTE — Assessment & Plan Note (Signed)
 HEALTHCARE                         GASTROENTEROLOGY OFFICE NOTE   NAME:[name], [name]                   MRN:          [id number]  DATE:05/17/2007                            DOB:          10/10/1955    HISTORY:  This is a 55 year old female with a history of celiac disease,  hypertension, diabetes, hyperlipidemia, and anxiety with depression, and  degenerative spinal disease who presents today for followup.  She is  evaluated in the office April 14, 2007 for lower abdominal and low  back pain for 2 weeks.  See that dictation.  Recent CT scan suggested  thickening of the left colon.  Her pain was actually felt to be related  to her degenerative spine disease and an MRI was obtained, prior post-op  changes in her lumbar spine, as well as degeneration and disk protrusion  noted.  The patient was given a copy of her MRI results and told to  follow up with her surgeon if she had further problems. Regarding the  colon, she underwent colonoscopy April 21, 2007.  She was found to  have a diffuse left-sided segmental colitis endoscopically consistent  with Crohn's disease.  Biopsies were obtained and revealed active  colitis with focal erosion and mild chronic changes, most consistent  with inflammatory bowel disease.  Similar endoscopic findings were noted  some years ago by another gastroenterologist, though the biopsies at  that time were reported as negative.  She presents now for a routine  followup as requested.  Currently, she is without complaints and feeling  well.  No change in bowel habits, bleeding, mucus, or abdominal pain.   CURRENT MEDICATIONS:  Hydrochlorothiazide.  Fexofenadine.  Flonase.  Prozac.  Lorazepam.  Trazodone.  Ambien.  Imuran.  Glucosamine.  Calcium.  Folic acid.  Poly-Iron.  Vitamin B12.  Lamictal.   ALLERGIES:  SULFA.   PHYSICAL EXAM:  Well-appearing female in no acute distress.  Blood pressure 98/58, heart rate 72,  weight 155.6 pounds.  HEENT:  Sclerae anicteric.  ABDOMEN:  Soft without tenderness, mass, or hernia.  Good bowel sounds  heard.   IMPRESSION:  1. Celiac disease. Symptoms controlled on gluten-free diet.  2. Mild left-sided colitis, most likely mild Crohn's disease.   RECOMMENDATIONS:  1. Continue gluten-free diet.  2. Initiate Lialda (mesalamine) 2.4 g daily for colitis.  3. Routine gastrointestinal followup in 3 months .  4. Ongoing general medical care with Dr. Clelia Croft.     Wilhemina Bonito. Marina Goodell, MD  Electronically Signed    JNP/MedQ  DD: 05/17/2007  DT: 05/17/2007  Job #: [id number]   cc:   Kari Baars, M.D.  Daniel L. Eda Paschal, M.D.

## 2010-11-12 NOTE — Op Note (Signed)
NAME:  Sabrina [name], Sabrina [name]            ACCOUNT NO.:  [id number]   MEDICAL RECORD NO.:  [id number]          PATIENT TYPE:  AMB   LOCATION:  [facility]                         FACILITY:  [facility]   PHYSICIAN:  Valetta Fuller, M.D.  DATE OF BIRTH:  Nov 27, 1955   DATE OF PROCEDURE:  07/03/2008  DATE OF DISCHARGE:                               OPERATIVE REPORT   PREOPERATIVE DIAGNOSIS:  Interstitial cystitis.   POSTOPERATIVE DIAGNOSIS:  Interstitial cystitis.   PROCEDURE PERFORMED:  Cystoscopy with hydraulic overdistention of  bladder and instillation of Clorpactin and Marcaine.   SURGEON:  Valetta Fuller, M.D.   ANESTHESIA:  General.   INDICATIONS:  [name] is a 55 year old female.  She has had  longstanding history of painful bladder syndrome.  Six years ago she  underwent hydraulic overdistention of the bladder with good relief of  her symptoms.  She has been managed on continuous Elmiron but has had a  flare-up of her interstitial cystitis.  She requested repeat hydraulic  overdistention of her bladder.   OPERATIVE TECHNIQUE AND FINDINGS:  The patient was brought to the  operating room where she had successful induction of endotracheal  anesthesia.  She was placed in lithotomy position and prepped and draped  in the usual manner.  Initial cystoscopy revealed unremarkable urethra  and bladder.  The patient underwent hydraulic overdistention of the  bladder to 100 cm water pressure for 5 minutes.  Capacity was 1500 mL  but she did have 3-4+ diffuse glomerular hemorrhaging with bloody  effluent from her bladder at the completion of the hydraulic  overdistention.  She then underwent instillation of Clorpactin at  standard dosing, a few hundred mL at a time under low pressure gravity  drainage.  The bladder was then drained and Marcaine was instilled.  She  appeared to tolerate the procedure well.  There were no obvious  complications or difficulties and was brought to recovery room in  stable  condition.      Valetta Fuller, M.D.  Electronically Signed     DSG/MEDQ  D:  07/04/2008  T:  07/04/2008  Job:  [id number]

## 2010-11-12 NOTE — Assessment & Plan Note (Signed)
[facility]                         GASTROENTEROLOGY OFFICE NOTE   NAME:Sabrina [name], KEVEN SOUCY                   MRN:          [id number]  DATE:04/14/2007                            DOB:          1955-10-21    REFERRING PHYSICIAN:  Dr. Clelia Croft   ADD-ON VISIT NOTE:   PRIMARY CARE PHYSICIAN:  Tera Mater. Evlyn Kanner, M.D.   PROBLEM:  Severe lower abdominal and lower back pain x2 weeks.   HISTORY:  Sabrina [name] is a pleasant 55 year old white female known to Dr.  [name] who has not been seen in our office since August 2006.  She  has a history of celiac disease which had been previously biopsy-proven  and has been maintained on a gluten-free diet.  She also has a history  of anxiety, depression, status post appendectomy, and bilateral tubal  ligation.  She says that she has been doing very well over the past  couple of years and really has not had any GI problems.  Her current  symptoms started about 2.5 weeks ago when she developed pain in her very  low back on the left which radiated around into her left lower quadrant.  Since onset, the pain has been constant and is aggravated by any type of  movement.  Described as a pressure or aching type of pain.  She says the  pain at this point is also radiating across to the right lower quadrant.  She says she is not really comfortable in any position but feels best  when she is lying flat on her back with her legs bent at the knee.  She  has not had any associated fever or chills, no change in her bowel  habits, no melena or hematochezia.  She has not had any injury that she  has been aware of but did have a remote laminectomy approximately 25  years ago.  She has no dysuria, or, frequency, etcetera.  She does  describe some slight nausea without vomiting.  Interestingly, over the  past 3 months she had lost about 20 pounds which was unintentional but  had been undergoing a lot of oral surgery, etc., and says that she  had  been on liquids for a prolonged period of time.   The patient was evaluated by Dr. Clelia Croft and underwent CT scan of the  abdomen and pelvis, which was done at [facility], with oral but no IV  contrast on April 06, 2007.  This reads circumferential wall-thickening  of the distal left colon, consider inflammatory process, infectious  colitis, neoplasia/lymphoma, right adnexal cystic mass, followup  ultrasound, and cholelithiasis.   CURRENT MEDICATIONS:  1. HCTZ 25 daily.  2. Fexofenadine HCL 180 mg daily.  3. Flonase 0.05% daily.  4. Prozac 40 daily.  5. Lorazepam 0.5 q.i.d.  6. Lamictal 200 q.h.s.  7. Trazodone 25 q.h.s.  8. Ambien 10 q.h.s.  9. Elmiron 100 b.i.d.  10.Glucosamine 1,000 b.i.d.  11.Calcium plus D b.i.d.  12.Folic acid b.i.d.  13.Poly-Iron daily.  14.B12 supplement daily.   ALLERGIES:  SULFA which causes throat swelling and respiratory distress.   FAMILY HISTORY:  Negative  for GI disease.  Positive for diabetes in  multiple family members.   PAST HISTORY:  As outlined above.   SOCIAL HISTORY:  The patient is married, lives with her husband.  She is  employed in medical transcription.  Nonsmoker, nondrinker.   REVIEW OF SYSTEMS:  Positive for chronic allergy sinus symptoms,  arthritic pain, occasional night sweats, dysmenorrhea, and severe  fatigue in addition to GI as outlined above.   PHYSICAL EXAMINATION:  GENERAL:  A well developed white female in no  acute distress, somewhat pale.  VITAL SIGNS:  Height is 5 feet 4 inches, weight is 156.2, blood pressure  112/60, pulse is 62.  HEENT:  Nontraumatic, normocephalic.  EOMI.  PERRLA.  Sclerae are  anicteric.  CARDIOVASCULAR:  Regular rate and rhythm with S1 and S2.  No murmur,  rub, or gallop.  PULMONARY:  Clear to A&P.  ABDOMEN:  Soft.  She is tender in the left lower quadrant, mildly in the  right lower quadrant.  There is no guarding or rebound.  No palpable  mass.  Stool is hemoccult negative.   BACK:  She is tender in the lumbar paraspinous muscles.   IMPRESSION:  A 55 year old white female with known celiac disease on a  gluten-free diet, now with 2.5-week history of low back pain radiating  into the left lower quadrant and across the lower abdomen and abnormal  CT scan suggesting distal colonic wall thickening of unclear etiology.  Suspect that at least a component of her pain is more musculoskeletal  perhaps with a radiculopathy.  However, could not rule out a colonic  lesion or left colon inflammatory process given the CT findings.   PLAN:  1. Schedule colonoscopy with Dr. Marina Goodell.  2. Schedule MRI of the lumbosacral spine.  3. Continue hydrocodone which she had been given per Dr. Clelia Croft, q.4-6      h. p.r.n. for pain.      Mike Gip, PA-C  Electronically Signed      Wilhemina Bonito. Marina Goodell, MD  Electronically Signed   AE/MedQ  DD: 04/14/2007  DT: 04/15/2007  Job #: [id number]

## 2010-11-12 NOTE — Assessment & Plan Note (Signed)
[facility]                         GASTROENTEROLOGY OFFICE NOTE   NAME:Wilson, GINNIE MARICH                   MRN:          [id number]  DATE:10/25/2007                            DOB:          [birth year]    REASON FOR EVALUATION:  Ongoing management of Crohn colitis and celiac  disease.   HISTORY:  This is a 55 year old female with hypertension, diabetes,  hyperlipidemia, anxiety/depression, degenerative spinal disease, celiac  disease, and mild left-sided colitis, most likely Crohn disease.  She  was last evaluated in the office May 17, 2007.  See that dictation  for details.  For her celiac disease she was continued on a gluten-free  diet.  With regard to her left-sided colitis, she was placed on Lialda  2.4 g daily.  She was asked to have routine follow-up in 3 months.  She  follows up at this time.  The patient reports to me that her abdomen  feels the best that it has felt in years.  No abdominal pain, loose  stools, mucus or bleeding.  Her appetite and weight are good.  She does  admit that she is cheating a bit on her gluten-free diet.  Strict  compliance reinforced.  She is tolerating Lialda without apparent side  effects or issues.   ALLERGIES:  SULFA.   CURRENT MEDICATIONS:  1. Hydrochlorothiazide 25 mg daily.  2. Fexofenadine 180 mg daily.  3. Flonase.  4. Prozac 40 mg daily.  5. Lorazepam 0.5 mg q.i.d.  6. Trazodone 25 mg at night.  7. Ambien 10 mg at night.  8. Elmiron 100 mg b.i.d.  9. Lamictal 200 mg at night.  10.Iron supplement.  11.Lialda 1.2 g b.i.d.  12.Fem hormone replacement therapy 0.5 mg daily.   PHYSICAL EXAM:  A well-appearing female in no acute distress.  Blood pressure is 102/70, heart rate is 80, respirations 16, weight is  154 pounds.  She is 5 feet 4 inches in height.  HEENT:  Sclerae are anicteric.  Conjunctivae are pink.  Oral mucosa is  intact.  LUNGS:  Clear.  HEART:  Regular.  ABDOMEN:  Soft  without tenderness, mass or hernia.  Good bowel sounds  heard.  EXTREMITIES:  Without edema.   IMPRESSION:  1. Celiac disease.  Currently asymptomatic.  For the most part      compliant with gluten-free diet.  2. Mild left-sided colitis endoscopically and histologically,      consistent with Crohn's.  Asymptomatic on Lialda.  3. Multiple general medical problems.   RECOMMENDATIONS:  1. Strict adherence to gluten-free diet.  2. Continue Lialda 2.4 g daily.  A prescription with multiple refills      has been provided.  3. Ongoing general medical care with Dr. Clelia Croft.  4. GI follow-up in one year.  Sooner if clinically indicated.     Wilhemina Bonito. Marina Goodell, MD  Electronically Signed    JNP/MedQ  DD: 10/25/2007  DT: 10/25/2007  Job #: [id number]   cc:   Kari Baars, M.D.  Daniel L. Eda Paschal, M.D.

## 2010-11-13 ENCOUNTER — Encounter: Payer: Self-pay | Admitting: Family Medicine

## 2010-11-13 ENCOUNTER — Ambulatory Visit (INDEPENDENT_AMBULATORY_CARE_PROVIDER_SITE_OTHER): Payer: BC Managed Care – PPO | Admitting: Family Medicine

## 2010-11-13 ENCOUNTER — Other Ambulatory Visit (HOSPITAL_COMMUNITY)
Admission: RE | Admit: 2010-11-13 | Discharge: 2010-11-13 | Disposition: A | Discharge status: Home / Self Care | Payer: BC Managed Care – PPO | Source: Ambulatory Visit | Attending: Family Medicine | Admitting: Family Medicine

## 2010-11-13 DIAGNOSIS — E785 Hyperlipidemia, unspecified: Secondary | ICD-10-CM

## 2010-11-13 DIAGNOSIS — Z1239 Encounter for other screening for malignant neoplasm of breast: Secondary | ICD-10-CM | POA: Insufficient documentation

## 2010-11-13 DIAGNOSIS — M129 Arthropathy, unspecified: Secondary | ICD-10-CM

## 2010-11-13 DIAGNOSIS — E119 Type 2 diabetes mellitus without complications: Secondary | ICD-10-CM

## 2010-11-13 DIAGNOSIS — Z124 Encounter for screening for malignant neoplasm of cervix: Secondary | ICD-10-CM

## 2010-11-13 DIAGNOSIS — I10 Essential (primary) hypertension: Secondary | ICD-10-CM

## 2010-11-13 DIAGNOSIS — Z01419 Encounter for gynecological examination (general) (routine) without abnormal findings: Secondary | ICD-10-CM | POA: Insufficient documentation

## 2010-11-13 NOTE — Progress Notes (Signed)
  Subjective:    Patient ID: Sabrina [name], female    DOB: 07-28-1955, 55 y.o.   MRN: [id number]  HPI CPE- overdue on mammo (typically goes to [facility]).  UTD on colonoscopy.  Joint pains- bilateral knees and hips.  Pain has been present for a while but recently worsening.  Lives on 2nd floor- having difficulty w/ stairs.  Taking Osteobiflex.     Review of Systems Patient reports no vision/ hearing changes, adenopathy,fever, weight change,  persistant/recurrent hoarseness , swallowing issues, chest pain, palpitations, edema, persistant/recurrent cough, hemoptysis, dyspnea (rest/exertional/paroxysmal nocturnal), gastrointestinal bleeding (melena, rectal bleeding), abdominal pain, significant heartburn, bowel changes, GU symptoms (dysuria, hematuria, incontinence), Gyn symptoms (abnormal  bleeding, pain),  syncope, focal weakness, memory loss, numbness & tingling, skin/hair/nail changes, abnormal bruising or bleeding, anxiety, or depression.     Objective:   Physical Exam  General Appearance:    Alert, cooperative, no distress, appears stated age  Head:    Normocephalic, without obvious abnormality, atraumatic  Eyes:    PERRL, conjunctiva/corneas clear, EOM's intact, fundi    benign, both eyes  Ears:    Normal TM's and external ear canals, both ears  Nose:   Nares normal, septum midline, mucosa normal, no drainage    or sinus tenderness  Throat:   Lips, mucosa, and tongue normal; teeth and gums normal  Neck:   Supple, symmetrical, trachea midline, no adenopathy;    Thyroid: no enlargement/tenderness/nodules  Back:     Symmetric, no curvature, ROM normal, no CVA tenderness  Lungs:     Clear to auscultation bilaterally, respirations unlabored  Chest Wall:    No tenderness or deformity   Heart:    Regular rate and rhythm, S1 and S2 normal, no murmur, rub   or gallop  Breast Exam:    No tenderness, masses, or nipple abnormality  Abdomen:     Soft, non-tender, bowel sounds active all  four quadrants,    no masses, no organomegaly  Genitalia:    External genitalia normal, cervix normal in appearance, no CMT, uterus in normal size and position, adnexa w/out mass or tenderness, mucosa pink and moist, no lesions or discharge present  Rectal:    Normal external appearance  Extremities:   Extremities normal, atraumatic, no cyanosis or edema.  + crepitus of L knee  Pulses:   2+ and symmetric all extremities  Skin:   Skin color, texture, turgor normal, no rashes or lesions  Lymph nodes:   Cervical, supraclavicular, and axillary nodes normal  Neurologic:   CNII-XII intact, normal strength, sensation and reflexes    throughout          Assessment & Plan:

## 2010-11-13 NOTE — Patient Instructions (Signed)
Follow up in 4 months to recheck diabetes We'll notify you of your lab results Try and remain active and make healthy food choices We'll call you with your physical therapy referral and mammogram Call with any questions or concerns Happy [employer] Day!

## 2010-11-15 NOTE — Op Note (Signed)
NAME:  GARRIE, ELENES            ACCOUNT NO.:  [id number]   MEDICAL RECORD NO.:  [id number]          PATIENT TYPE:  AMB   LOCATION:  [facility]                          FACILITY:  [facility]   PHYSICIAN:  Katy Fitch. Sypher Montez Hageman., M.D.DATE OF BIRTH:  [birth year]   DATE OF PROCEDURE:  08/29/2004  DATE OF DISCHARGE:                                 OPERATIVE REPORT   PREOPERATIVE DIAGNOSIS:  Chronic sub-AC joint impingement right shoulder  with subacromial bursitis, evidence of adhesive capsulitis and MRI evidence  of a significant deep surface degenerative rotator cuff tear.   POSTOPERATIVE DIAGNOSIS:  Severe adhesive capsulitis with degeneration of  supraspinatus tendon on deep surface with approximately 80-90% near full-  thickness tear of the anterior leading edge of the supraspinatus and chronic  sub-AC joint impingement.   OPERATION:  1.  Examination of right shoulder under anesthesia with manipulation      releasing adhesive capsulitis granulation tissue.  2.  Open resection of distal clavicle, i.e., Mumford procedure.  3.  Arthroscopic debridement of adhesive capsulitis, followed by the      debridement of deep surface supraspinatus rotator cuff tear and      extensive photographic documentation of articular pathology.  4.  Subacromial decompression with bursectomy, coracoacromial ligament      release and acromioplasty.   OPERATION SURGEON:  Katy Fitch. Sypher, M.D.   ASSISTANT:  Marveen Reeks. Dasnoit, P.A.C.   ANESTHESIA:  General by endotracheal technique supplemented by interscalene  block   SUPERVISING ANESTHESIOLOGIST:  Dr. Diamantina Monks.   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   She was referred by Dr. Barnett Abu, neurosurgeon, for evaluation and  management of a painful left shoulder. She was an established patient with  our practice who had undergone prior carpal tunnel release of the  1990s.   Dr. Danielle Dess had evaluated Ms.  Ordaz's shoulder after providing care for  her cervical spine and obtained an MRI the right shoulder in October 2005,  that was interpreted by Dr. Purcell Mouton to reveal signs of supraspinatus  tendinosis with a partial articular surface distally and chronic subdeltoid,  subcoracoid bursitis. Dr. Purcell Mouton did not note evidence of full-thickness  rotator cuff tear.   Mr. Mccready was seen in consultation in November 2006,  noted have signs of  significant sub AC joint impingement, chronic bursitis and on review the MRI  in my judgment, I noted least in 80% deep surface tear of the supraspinatus.   We recommended that she schedule arthroscopic debridement and appropriate  management of her adhesive capsulitis and/or rotator cuff pathology.   She is initially scheduled for July 30, 2004,  however, her preop lab  revealed that she had a profound iron deficiency anemia.   She does have a background history of coeliac sprue with chronic  malabsorption.   We referred her to see her attending gastroenterologist, Dr. Luther Parody, who  subsequently placed her on iron supplements and did extensive lab work to  determine the exact source of her anemia.   Chantavia has subsequently undergone nutritional management, further  management of her coeliac sprue and transfusion of 4 units of packed red  cells over the past two weeks in an effort to raise her hemoglobin above  12.5 g percent.   On the morning of surgery, she is noted to have a hemoglobin in excess of 13  g and after clearance by Dr. Luther Parody and after anesthesia consultation by  Dr. Katrinka Blazing, she is brought to the operating room at this time for right  shoulder intervention.   Preoperatively questions were invited and answered.   She was informed of the potential risks, benefits of surgery and  specifically advised to be immediately mobile due to the risk of pulmonary  embolus, deep vein thrombosis  and pulmonary complications of inactivity.   After informed consent, she is brought to the operating room at this time.   PROCEDURE:  Malaak Stach is brought to operating room and placed in  supine position on the operating table.   Following interview by Dr. Katrinka Blazing, an interscalene block was placed holding  area the complication.   She was then transported to room #8, placed in supine position on the  operating table and general orotracheal anesthesia induced. She was  carefully positioned in the beach-chair position with aid of a torso and  head holder designed for shoulder arthroscopy.   The entire extremity and forequarter were prepped with DuraPrep and draped  with impervious arthroscopy drapes.   The shoulder was examined under anesthesia. Combined elevation was noted to  be limited at 110 degrees of elevation an external rotation limited at 60  degrees due to adhesive capsulitis.   With gentle manipulation, elevation combined was increased to 180 degrees,  external rotation at 90 degrees,  abduction increased to 95 degrees and  internal rotation to 85 degrees.   The landmarks were then noted with a skin marking pencil followed by  proceeding with resection of the distal 15 mm clavicle through a 2 cm  incision centered over the [facility] joint.   Subcutaneous tissues were carefully divided, taking care to identify the  capsule of AC joint. The distal 15 mm clavicle was exposed subperiosteally  with an osteotome used as a periosteal elevator.   Baby Bennett retractors were placed followed by resection of the distal 15  mm clavicle.   The meniscus of the [facility] joint was partly degenerative and there was  chondromalacia at the distal clavicle. The prominent inferior distal  clavicle was resected.   The dead space created by resection distal clavicle was then closed by repair of the anterior deltoid to the trapezius muscle with mattress suture  of a #2 FiberWire. The wound was  repaired with subcutaneous suture of 3-0  Vicryl and intradermal 3-0 Prolene.   The shoulder joint was then distended with 20 mL of sterile saline placed  with a spinal needle anteriorly.   The shoulder was entered with a blunt trocar through a standard posterior  portal to facilitate glenohumeral arthroscopy.   Extensive adhesive capsulitis granulations were noted surrounding the biceps  origin, the superior recess and inferior recess. Photographic documentation  of the adhesive capsulitis predicament was completed. The anterior capsule  ligaments were obscured partially by the granulation tissues.   An anterior portal was created under direct vision followed by use of a 4.5-  mm suction shaver to debride the granulations and a radiofrequency probe to  obtain hemostasis.   The deep surface of the rotator interval anterior the biceps was quite  degenerative and the anterior  2 cm of the supraspinatus tendon was extremely  ragged with fibrillated degenerative tendon.   The suction shaver was used to debride all the fibrillated tendon down to a  smooth margin.   Just posterior to the biceps tendon there was a 90% plus near full-thickness  tear of the rotator cuff covered only by bursa. The remainder of the  supraspinatus posteriorly, infraspinatus and teres minor were intact.   Given this predicament and the predicament of adhesive capsulitis, in my  judgment, repair is probably warranted with proper decompression.   After completion of the intra-articular debridement and hemostasis, the  scope was removed and placed in subacromial space. A rather prominent  bursitis was noted.   The anatomy the coracoacromial arch was studied. Hemostasis was achieved and  granulations removed with a radiofrequency probe followed by release of  coracoacromial ligament and hemostasis of the branch of the thyroacromial  arch.   The acromion was leveled to a type I morphology. The bursa was cleaned  off  of the rotator cuff and photographic documentation of the cuff revealed  intense inflammation of the bursal surface of the cuff but no sign of a full-  thickness tear.   My final diagnosis after thorough evaluation of Ms. Crombie's shoulder is  that she has had chronic adhesive capsulitis, chronic sub AC joint and  subacromial impingement. She has a small degenerative full-thickness rotator  cuff tear that should improve or heal after proper decompression.   This has been aggressively debrided, documented with digital photography and  our plan will be to begin immediate range of motion exercises.   It is possible and a later date, she got require former rotator cuff repair  if her degenerative tendinopathy progresses, however, at the present time,  all extrinsic sources of tendon trauma have been remedied.     RVS/MEDQ  D:  08/29/2004  T:  08/29/2004  Job:  [id number]   cc:   Stefani Dama, M.D.  [address]  [place]  [place] [address]  Fax: [phone]

## 2010-11-15 NOTE — Op Note (Signed)
[place]  Patient:    Sabrina [name], Sabrina [name] Visit Number: [id number] MRN: [id number]          Service Type: NES Location: [facility] Attending Physician:  Thermon Leyland Dictated by:   Barron Alvine, M.D. Proc. Date: 06/09/01 Admit Date:  06/09/2001 Discharge Date: 06/09/2001                             Operative Report  PREOPERATIVE DIAGNOSIS:  1. Pelvic pain. 2. Frequency and urgency.  3. Possible interstitial cystitis.  POSTOPERATIVE DIAGNOSIS:  1. Pelvic pain. 2. Frequency and urgency.  3. Possible interstitial cystitis.  OPERATION/PROCEDURE:  Urethral dilatation, cystoscopy, hydraulic overdistention of the bladder with Clorpactin installation.  SURGEON:  Barron Alvine, M.D.  ANESTHESIA:  General.  INDICATIONS:  This patient has had some long-standing complaints of pelvic pain along with frequency and urgency.  Other abnormalities have been eliminated.  We have felt that the potentially she has a component of irritable bladder syndrome or possible interstitial cystitis.  We talked at length with her about the possible diagnostic as well as therapeutic advantage of doing a hydraulic overdistention of the bladder and she presents now for that procedure.  TECHNIQUE AND FINDINGS:  The patient was brought to the operating room where she had successful induction of general anesthesia.  She was placed in the moderate lithotomy position and prepped and draped in the usual manner.  Her urethra was gently dilated to 32 [place].  Cystoscopy was then performed and her bladder endoscopically was unremarkable.  She then underwent hydraulic overdistention to 87 ______ of water pressure for 5 minutes.  Capacity was quite normal at 1200 cc but she did have some 2-3+ diffuse glomerulations consistent with at least mild interstitial cystitis.  A repeat hydraulic overdistention was performed.  Her bladder was then drained and a red Robinson catheter was  inserted.  Clorpactin at a standard concentration was instilled by a gravity installation.  This was then drained and some Artane and Pyridium were placed in her bladder.  The patient appeared to tolerate the procedure well.  There was no obvious complications. Dictated by:   Barron Alvine, M.D. Attending Physician:  Thermon Leyland DD:  07/05/01 TD:  07/05/01 Job: [id number]

## 2010-11-18 ENCOUNTER — Telehealth: Payer: Self-pay | Admitting: *Deleted

## 2010-11-18 ENCOUNTER — Encounter: Payer: Self-pay | Admitting: *Deleted

## 2010-11-18 DIAGNOSIS — M129 Arthropathy, unspecified: Secondary | ICD-10-CM

## 2010-11-18 NOTE — Telephone Encounter (Signed)
Pt states that she was referred for physical therapy at [facility]. Pt notes that she has to meet her deductible before she can be seen there so she would like to be referred to [name] instead. Pt indicate that she can make her own appt.  Please advise ok to refer

## 2010-11-19 ENCOUNTER — Ambulatory Visit: Payer: BC Managed Care – PPO | Admitting: Physical Therapy

## 2010-11-19 NOTE — Assessment & Plan Note (Signed)
Well controlled. Continue current meds

## 2010-11-19 NOTE — Assessment & Plan Note (Signed)
Refer for mammo.

## 2010-11-19 NOTE — Telephone Encounter (Signed)
Switching to [facility] would be fine.

## 2010-11-19 NOTE — Assessment & Plan Note (Signed)
Pt's PE WNL.  Check labs.  EKG as baseline.  Refer for mammo.  Anticipatory guidance provided.

## 2010-11-19 NOTE — Assessment & Plan Note (Signed)
Check labs.  Start meds prn. 

## 2010-11-19 NOTE — Assessment & Plan Note (Signed)
Previously well controlled w/out meds.  Check labs and determine if meds needed.

## 2010-11-19 NOTE — Assessment & Plan Note (Signed)
Refer to PT for home exercise program.  Reviewed supportive care and red flags that should prompt return.  Pt expressed understanding and is in agreement w/ plan.

## 2010-11-19 NOTE — Assessment & Plan Note (Signed)
Pap collected. 

## 2010-11-20 NOTE — Telephone Encounter (Signed)
PATIENT'S REFERRAL SENT TO [facility] [place] [facility], PER HER REQUEST.  DANA OF [facility] WILL CALL PT TO SCH'D THE INITIAL APPT/RH 11-20-10

## 2010-11-21 NOTE — Telephone Encounter (Signed)
Left Pt detail message referral has been changed, awaiting appt info.

## 2010-11-27 ENCOUNTER — Telehealth: Payer: Self-pay | Admitting: *Deleted

## 2010-11-27 MED ORDER — ERGOCALCIFEROL 1.25 MG (50000 UT) PO CAPS: 50000.0000 [IU] | ORAL_CAPSULE | ORAL | Status: DC

## 2010-11-27 NOTE — Telephone Encounter (Signed)
Per MD pt was notified that pap is normal. As for labs, LFTs are increased, hold tylenol and alcohol and re-check in 4-6 weeks. Also, Vitamin D is low, start 50,000 units weekly for 12 weeks and remainder of labs look good.  **Pt notes that she uses [facility] on [address] and her lft's have been elevated for 10 years.

## 2010-11-28 ENCOUNTER — Encounter: Payer: Self-pay | Admitting: Family Medicine

## 2010-12-23 ENCOUNTER — Other Ambulatory Visit: Payer: Self-pay

## 2010-12-23 MED ORDER — HYDROCHLOROTHIAZIDE 25 MG PO TABS: 25.0000 mg | ORAL_TABLET | Freq: Every day | ORAL | Status: DC

## 2010-12-26 ENCOUNTER — Ambulatory Visit (INDEPENDENT_AMBULATORY_CARE_PROVIDER_SITE_OTHER): Payer: BC Managed Care – PPO | Admitting: Family Medicine

## 2010-12-26 VITALS — BP 120/80 | Temp 98.3°F | Wt 208.4 lb

## 2010-12-26 DIAGNOSIS — M79609 Pain in unspecified limb: Secondary | ICD-10-CM

## 2010-12-26 DIAGNOSIS — M79673 Pain in unspecified foot: Secondary | ICD-10-CM | POA: Insufficient documentation

## 2010-12-26 MED ORDER — NAPROXEN 500 MG PO TABS: 500.0000 mg | ORAL_TABLET | Freq: Two times a day (BID) | ORAL | Status: DC

## 2010-12-26 NOTE — Assessment & Plan Note (Signed)
Pt's pain seems multifactorial- plantar fasciitis coupled w/ possible ganglion or other painful soft tissue mass.  Start scheduled NSAIDs, ice, refer to sports med for evaluation (possible [place]) and tx.  Pt expressed understanding and is in agreement w/ plan.

## 2010-12-26 NOTE — Patient Instructions (Signed)
Start the Naprosyn as directed- take w/ food to avoid upset stomach.  Take regularly for the next 7-10 days Ice your foot 2-3x/day over a water bottle Continue to wear cushioned, supportive shoes We'll call you with your sports medicine appt Hang in there!

## 2010-12-26 NOTE — Progress Notes (Signed)
  Subjective:    Patient ID: Sabrina [name], female    DOB: 20-Dec-1955, 55 y.o.   MRN: [id number]  HPI Foot pain- L foot.  Reports there was a 'lump the size of a popcorn kernel'.  It's now growing and painful.  No change in pain throughout the day.  Nothing improves pain- tylenol, ibuprofen, etc.  Went to PT for hips and was recommended to get new shoes.  Had similar sxs 3 months ago that resolved spontaneously.  sxs returned 1 week ago.   Review of Systems For ROS see HPI     Objective:   Physical Exam  Constitutional: She appears well-developed and well-nourished. No distress.  Musculoskeletal: Normal range of motion. She exhibits tenderness (over insertion of L plantar fascia and 2cm firm but mobile mass along center of fascia). She exhibits no edema.          Assessment & Plan:

## 2010-12-31 ENCOUNTER — Ambulatory Visit (INDEPENDENT_AMBULATORY_CARE_PROVIDER_SITE_OTHER): Payer: BC Managed Care – PPO | Admitting: Family Medicine

## 2010-12-31 ENCOUNTER — Encounter: Payer: Self-pay | Admitting: Family Medicine

## 2010-12-31 VITALS — BP 120/77 | HR 66 | Temp 97.6°F | Ht 64.0 in | Wt 210.6 lb

## 2010-12-31 DIAGNOSIS — M79609 Pain in unspecified limb: Secondary | ICD-10-CM

## 2010-12-31 DIAGNOSIS — M79673 Pain in unspecified foot: Secondary | ICD-10-CM

## 2010-12-31 NOTE — Patient Instructions (Signed)
You have plantar fasciitis with a plantar fibroma. Take tylenol or aleve as needed for pain  Plantar fascia stretch for 20-30 seconds (do 3 of these) in morning Lowering/raise on a step exercises 3 x 15 once or twice a day - this is very important for long term recovery. Can add heel walks, toe walks forward and backward as well Ice bucket 10-15 minutes at end of day Avoid flat shoes/barefoot walking as much as possible. Steroid injection is a consideration if you are struggling over the next 4-6 weeks. Physical therapy is also an option.

## 2010-12-31 NOTE — Progress Notes (Signed)
Subjective:    Patient ID: Sabrina [name], female    DOB: 1956/06/20, 55 y.o.   MRN: [id number]  PCP: Dr. Beverely Low  HPI 55 yo F here with plantar L foot pain.  Patient reports several month history of intermittent plantar left foot pain and a 'kernel' she noticed on bottom of foot in middle of arch. States past 10 days, however, the knot has grown in size and is more painful consistently. Limping and unable to get comfortable. Worse to press on area. Has tried naproxen and icing. Some pain throughout arch but centered on the knot. Has not tried any stretches or exercises for this. No PT.  Past Medical History  Diagnosis Date  . Arthritis   . Allergic rhinitis   . Colitis   . Degenerative disc disease   . Hyperlipidemia   . Hypertension   . Celiac disease   . Bipolar disorder   . Macular degeneration     Current Outpatient Prescriptions on File Prior to Visit  Medication Sig Dispense Refill  . ARIPiprazole (ABILIFY) 10 MG tablet Take 10 mg by mouth daily.        . ergocalciferol (VITAMIN D2) 50000 UNITS capsule Take 1 capsule (50,000 Units total) by mouth once a week.  4 capsule  3  . fexofenadine (ALLEGRA) 180 MG tablet Take 180 mg by mouth daily.        Marland Kitchen FLUoxetine (PROZAC) 40 MG capsule Take 40 mg by mouth daily.        . fluticasone (FLONASE) 50 MCG/ACT nasal spray USE AS DIRECTED  16 g  3  . hydrochlorothiazide 25 MG tablet Take 1 tablet (25 mg total) by mouth daily.  30 tablet  5  . iron polysaccharides (POLY-IRON 150) 150 MG capsule Take 150 mg by mouth 2 (two) times daily.        Marland Kitchen lamoTRIgine (LAMICTAL) 200 MG tablet Take 200 mg by mouth at bedtime.        Marland Kitchen LORazepam (ATIVAN) 0.5 MG tablet Take 0.5 mg by mouth 2 (two) times daily.        . mesalamine (LIALDA) 1.2 G EC tablet Take 1,200 mg by mouth 2 (two) times daily.        . Misc Natural Products (OSTEO BI-FLEX ADV DOUBLE ST PO) Take by mouth daily.        . Multiple Vitamin (MULTIVITAMIN) tablet Take 1  tablet by mouth daily.        . naproxen (NAPROSYN) 500 MG tablet Take 1 tablet (500 mg total) by mouth 2 (two) times daily with a meal.  60 tablet  2  . pentosan polysulfate (ELMIRON) 100 MG capsule Take 100 mg by mouth 2 (two) times daily.        . traZODone (DESYREL) 50 MG tablet Take 25 mg by mouth at bedtime.        Marland Kitchen zolpidem (AMBIEN) 10 MG tablet Take 10 mg by mouth at bedtime as needed.          Past Surgical History  Procedure Date  . Tubal ligation   . Appendectomy   . Lumbar laminectomy   . Carpal tunnel release     bilateral  . Rotator cuff repair     bilateral  . Cesarean section   . Tonsillectomy     Allergies  Allergen Reactions  . Sulfonamide Derivatives     History   Social History  . Marital Status: Married    Spouse Name: N/A  Number of Children: N/A  . Years of Education: N/A   Occupational History  . Not on file.   Social History Main Topics  . Smoking status: Never Smoker   . Smokeless tobacco: Not on file  . Alcohol Use: No  . Drug Use: No  . Sexually Active: Not on file   Other Topics Concern  . Not on file   Social History Narrative   Daughter lives in [place], [place]    Family History  Problem Relation Age of Onset  . Diabetes Mother   . Hypertension Mother   . Diabetes Brother   . Hypertension Brother   . Hyperlipidemia Brother   . Hypertension      BP 120/77  Pulse 66  Temp(Src) 97.6 F (36.4 C) (Oral)  Ht 5\' 4"  (1.626 m)  Wt 210 lb 9.6 oz (95.528 kg)  BMI 36.15 kg/m2  Review of Systems See HPI above.    Objective:   Physical Exam Gen: NAD L foot: Approximately 1cm circumferential mass plantar midfoot palpated within plantar fascia. Mild pain on plantar fascia stretch. Severe TTP at mass, mild TTP throughout rest of plantar fascia and anterior plantar calcaneus. FROM ankle with 5/5 strength all directions. Negative ant drawer and talar tilt. Negative thompsons. NVI distally.     MSK u/s L foot:  1.5cm  lengthx 0.6cm deep fibrous mass within plantar fascia - not cystic, consistent with plantar fibroma.  Assessment & Plan:  1. L plantar fasciitis with plantar fibroma - Recommended we start with home exercises and stretches for plantar fasciitis (step exercises, morning stretch - handout provided) as well as comforthotics with cutout for her fibroma - fitted this to her feet today and felt sore but appropriately dropped into hold cut out for fibroma.  If not improving with this modification and exercises over 4-6 weeks, recommended she consider cortisone injection with tenotomy to fibroma.  Will f/u with me in 4-6 weeks for recheck.  If doing well, can make additional sports insoles for her and/or show her how to order these from [employer].

## 2010-12-31 NOTE — Assessment & Plan Note (Signed)
L plantar fasciitis with plantar fibroma - Recommended we start with home exercises and stretches for plantar fasciitis (step exercises, morning stretch - handout provided) as well as comforthotics with cutout for her fibroma - fitted this to her feet today and felt sore but appropriately dropped into hold cut out for fibroma.  If not improving with this modification and exercises over 4-6 weeks, recommended she consider cortisone injection with tenotomy to fibroma.  Will f/u with me in 4-6 weeks for recheck.  If doing well, can make additional sports insoles for her and/or show her how to order these from [employer].

## 2011-01-02 ENCOUNTER — Ambulatory Visit: Payer: BC Managed Care – PPO | Admitting: Family Medicine

## 2011-02-03 ENCOUNTER — Other Ambulatory Visit: Payer: Self-pay | Admitting: Family Medicine

## 2011-02-03 ENCOUNTER — Encounter: Payer: Self-pay | Admitting: *Deleted

## 2011-02-06 ENCOUNTER — Ambulatory Visit (INDEPENDENT_AMBULATORY_CARE_PROVIDER_SITE_OTHER): Payer: BC Managed Care – PPO | Admitting: Family Medicine

## 2011-02-06 ENCOUNTER — Encounter: Payer: Self-pay | Admitting: Family Medicine

## 2011-02-06 VITALS — BP 106/74 | HR 71 | Temp 97.7°F | Ht 64.0 in | Wt 200.0 lb

## 2011-02-06 DIAGNOSIS — M79673 Pain in unspecified foot: Secondary | ICD-10-CM

## 2011-02-06 DIAGNOSIS — M79609 Pain in unspecified limb: Secondary | ICD-10-CM

## 2011-02-07 ENCOUNTER — Encounter: Payer: Self-pay | Admitting: Family Medicine

## 2011-02-07 NOTE — Assessment & Plan Note (Signed)
L plantar fasciitis with plantar fibroma - significant improvement with stretches/exercises and comforthotics with cutout.  Made an additional pair of comforthotics for her today and showed her how to order these from [employer] and cut out the hole for fibroma to drop through.  F/u prn.

## 2011-02-07 NOTE — Progress Notes (Signed)
Subjective:    Patient ID: Sabrina [name], female    DOB: 07/29/1955, 55 y.o.   MRN: [id number]  PCP: Dr. Beverely Low  HPI  55 yo F here for 6 week f/u L plantar fasciitis and fibroma  7/3: Patient reports several month history of intermittent plantar left foot pain and a 'kernel' she noticed on bottom of foot in middle of arch. States past 10 days, however, the knot has grown in size and is more painful consistently. Limping and unable to get comfortable. Worse to press on area. Has tried naproxen and icing. Some pain throughout arch but centered on the knot. Has not tried any stretches or exercises for this. No PT.  8/9: Patient reports she is significantly better with exercises and comforthotics with cutout for fibroma. Pain currently 0/10 and she can hardly feel the know. Doing the stretches and exercises every day. No other complaints. Not taking any pain medicines.  Past Medical History  Diagnosis Date  . Arthritis   . Allergic rhinitis   . Colitis   . Degenerative disc disease   . Hyperlipidemia   . Hypertension   . Celiac disease   . Bipolar disorder   . Macular degeneration     Current Outpatient Prescriptions on File Prior to Visit  Medication Sig Dispense Refill  . ARIPiprazole (ABILIFY) 10 MG tablet Take 10 mg by mouth daily.        . ergocalciferol (VITAMIN D2) 50000 UNITS capsule Take 1 capsule (50,000 Units total) by mouth once a week.  4 capsule  3  . fexofenadine (ALLEGRA) 180 MG tablet Take 180 mg by mouth daily.        Marland Kitchen FLUoxetine (PROZAC) 40 MG capsule Take 40 mg by mouth daily.        . fluticasone (FLONASE) 50 MCG/ACT nasal spray USE AS DIRECTED  16 g  3  . hydrochlorothiazide 25 MG tablet Take 1 tablet (25 mg total) by mouth daily.  30 tablet  5  . lamoTRIgine (LAMICTAL) 200 MG tablet Take 200 mg by mouth at bedtime.        Marland Kitchen LORazepam (ATIVAN) 0.5 MG tablet Take 0.5 mg by mouth 2 (two) times daily.        . mesalamine (LIALDA) 1.2 G EC tablet  Take 1,200 mg by mouth 2 (two) times daily.        . Misc Natural Products (OSTEO BI-FLEX ADV DOUBLE ST PO) Take by mouth daily.        . Multiple Vitamin (MULTIVITAMIN) tablet Take 1 tablet by mouth daily.        . naproxen (NAPROSYN) 500 MG tablet Take 1 tablet (500 mg total) by mouth 2 (two) times daily with a meal.  60 tablet  2  . pentosan polysulfate (ELMIRON) 100 MG capsule Take 100 mg by mouth 2 (two) times daily.        Marland Kitchen POLY-IRON 150 150 MG capsule TAKE ONE CAPSULE BY MOUTH TWICE DAILY  60 each  1  . traZODone (DESYREL) 50 MG tablet Take 25 mg by mouth at bedtime.        Marland Kitchen zolpidem (AMBIEN) 10 MG tablet Take 10 mg by mouth at bedtime as needed.          Past Surgical History  Procedure Date  . Tubal ligation   . Appendectomy   . Lumbar laminectomy   . Carpal tunnel release     bilateral  . Rotator cuff repair  bilateral  . Cesarean section   . Tonsillectomy     Allergies  Allergen Reactions  . Sulfonamide Derivatives     History   Social History  . Marital Status: Married    Spouse Name: N/A    Number of Children: N/A  . Years of Education: N/A   Occupational History  . Not on file.   Social History Main Topics  . Smoking status: Never Smoker   . Smokeless tobacco: Not on file  . Alcohol Use: No  . Drug Use: No  . Sexually Active: Not on file   Other Topics Concern  . Not on file   Social History Narrative   Daughter lives in [place], [place]    Family History  Problem Relation Age of Onset  . Diabetes Mother   . Hypertension Mother   . Diabetes Brother   . Hypertension Brother   . Hyperlipidemia Brother   . Hypertension      BP 106/74  Pulse 71  Temp(Src) 97.7 F (36.5 C) (Oral)  Ht 5\' 4"  (1.626 m)  Wt 200 lb (90.719 kg)  BMI 34.33 kg/m2  Review of Systems  See HPI above.    Objective:   Physical Exam  Gen: NAD L foot: Approximately 1cm circumferential mass plantar midfoot palpated within plantar fascia. No pain on plantar  fascia stretch. No TTP at mass, no TTP throughout rest of plantar fascia and anterior plantar calcaneus - significantly improved. FROM ankle with 5/5 strength all directions. Negative ant drawer and talar tilt. Negative thompsons. NVI distally.     Assessment & Plan:  1. L plantar fasciitis with plantar fibroma - significant improvement with stretches/exercises and comforthotics with cutout.  Made an additional pair of comforthotics for her today and showed her how to order these from [employer] and cut out the hole for fibroma to drop through.  F/u prn.

## 2011-02-18 ENCOUNTER — Encounter: Payer: Self-pay | Admitting: Internal Medicine

## 2011-02-25 ENCOUNTER — Ambulatory Visit: Payer: BC Managed Care – PPO | Admitting: Family Medicine

## 2011-02-28 ENCOUNTER — Other Ambulatory Visit: Payer: Self-pay | Admitting: Family Medicine

## 2011-03-06 ENCOUNTER — Telehealth: Payer: Self-pay | Admitting: Family Medicine

## 2011-03-06 NOTE — Telephone Encounter (Signed)
A1C- dx DM BMP- dx DM LFTs- dx abnormal LFTs Vit D- dx Vit D deficiency

## 2011-03-06 NOTE — Telephone Encounter (Signed)
Patient has a follow up appt [id number] - she wants to have lab prior to appt at a lab at work she will pick up  lab order  Friday [phone]

## 2011-03-06 NOTE — Telephone Encounter (Signed)
Order ready for pick up

## 2011-03-08 ENCOUNTER — Other Ambulatory Visit: Payer: Self-pay | Admitting: Family Medicine

## 2011-03-10 ENCOUNTER — Ambulatory Visit: Payer: BC Managed Care – PPO | Admitting: Family Medicine

## 2011-03-20 ENCOUNTER — Encounter: Payer: Self-pay | Admitting: Family Medicine

## 2011-03-20 ENCOUNTER — Ambulatory Visit (INDEPENDENT_AMBULATORY_CARE_PROVIDER_SITE_OTHER): Payer: BC Managed Care – PPO | Admitting: Family Medicine

## 2011-03-20 DIAGNOSIS — E119 Type 2 diabetes mellitus without complications: Secondary | ICD-10-CM

## 2011-03-20 DIAGNOSIS — R011 Cardiac murmur, unspecified: Secondary | ICD-10-CM

## 2011-03-20 DIAGNOSIS — I350 Nonrheumatic aortic (valve) stenosis: Secondary | ICD-10-CM | POA: Insufficient documentation

## 2011-03-20 DIAGNOSIS — Z23 Encounter for immunization: Secondary | ICD-10-CM

## 2011-03-20 DIAGNOSIS — I10 Essential (primary) hypertension: Secondary | ICD-10-CM

## 2011-03-20 NOTE — Patient Instructions (Signed)
Schedule a follow up appt in January You look great!  Keep up the good work! We'll call you with your ECHO appt Call with any questions or concerns Happy Fall!!!

## 2011-03-20 NOTE — Assessment & Plan Note (Signed)
Now heard on 2 separate occasions.  Will get ECHO to assess.  Pt in agreement.

## 2011-03-20 NOTE — Progress Notes (Signed)
  Subjective:    Patient ID: Sabrina [name], female    DOB: 04/19/1956, 55 y.o.   MRN: [id number]  HPI HTN- chronic problem, excellent control.  On HCTZ.  Working on diet and exercise.  No CP, SOB, HAs, visual changes, edema.  DM- chronic problem, not checking sugars, not on meds.  Latest labs show excellent control w/ A1C of 6.2%.  UTD on eye exam.  Asymptomatic.  SEM- has never had ECHO, heard at last visit in May.  Reports no one prior to me had every mentioned this.   Review of Systems For ROS see HPI     Objective:   Physical Exam  Vitals reviewed. Constitutional: She is oriented to person, place, and time. She appears well-developed and well-nourished. No distress.  HENT:  Head: Normocephalic and atraumatic.  Eyes: Conjunctivae and EOM are normal. Pupils are equal, round, and reactive to light.  Neck: Normal range of motion. Neck supple. No thyromegaly present.  Cardiovascular: Normal rate, regular rhythm and intact distal pulses.   Murmur (II/VI SEM) heard. Pulmonary/Chest: Effort normal and breath sounds normal. No respiratory distress.  Abdominal: Soft. She exhibits no distension. There is no tenderness.  Musculoskeletal: She exhibits no edema.  Lymphadenopathy:    She has no cervical adenopathy.  Neurological: She is alert and oriented to person, place, and time.  Skin: Skin is warm and dry.  Psychiatric: She has a normal mood and affect. Her behavior is normal.          Assessment & Plan:

## 2011-03-20 NOTE — Assessment & Plan Note (Signed)
Pt's recent labs show excellent control.  No meds needed.  Continue to focus on healthy diet and exercise.  UTD on eye exam.

## 2011-03-20 NOTE — Assessment & Plan Note (Signed)
Excellent control.  Asymptomatic.  No changes. 

## 2011-03-21 ENCOUNTER — Ambulatory Visit (INDEPENDENT_AMBULATORY_CARE_PROVIDER_SITE_OTHER): Payer: BC Managed Care – PPO | Admitting: Internal Medicine

## 2011-03-21 ENCOUNTER — Encounter: Payer: Self-pay | Admitting: Internal Medicine

## 2011-03-21 VITALS — BP 108/70 | HR 68 | Ht 64.0 in | Wt 203.6 lb

## 2011-03-21 DIAGNOSIS — K900 Celiac disease: Secondary | ICD-10-CM

## 2011-03-21 DIAGNOSIS — K5010 Crohn's disease of large intestine without complications: Secondary | ICD-10-CM

## 2011-03-21 NOTE — Progress Notes (Signed)
HISTORY OF PRESENT ILLNESS:  Sabrina [name] is a 55 y.o. female with mild Crohn's colitis, hyperlipidemia, diabetes mellitus, anxiety/depression, hypertension, and celiac sprue. She presents today for routine followup. She was last seen September 2011. At that time she was asymptomatic on mesalamine. She reported incomplete gluten avoidance. Tissue transglutaminase antibody, IgA was elevated. She reports that she has been feeling well. She continues on Lialda. She reports complete noncompliance with a gluten-free diet. She is vitamin D deficient. She tells me that other blood work, obtained yesterday, including CBC and comprehensive metabolic panel were unremarkable. Her last colonoscopy was performed in October 2008.  REVIEW OF SYSTEMS:  All non-GI ROS negative.  Past Medical History  Diagnosis Date  . Arthritis   . Allergic rhinitis   . Colitis   . Degenerative disc disease   . Hyperlipidemia   . Hypertension   . Celiac disease   . Bipolar disorder   . Macular degeneration     Past Surgical History  Procedure Date  . Tubal ligation   . Appendectomy   . Lumbar laminectomy   . Carpal tunnel release     bilateral  . Rotator cuff repair     bilateral  . Cesarean section   . Tonsillectomy     Social History Sabrina [name]  reports that she has never smoked. She does not have any smokeless tobacco history on file. She reports that she does not drink alcohol or use illicit drugs.  family history includes Diabetes in her brother and mother; Hyperlipidemia in her brother; and Hypertension in her brother, mother, and unspecified family member.  There is no history of Colon cancer.  Allergies  Allergen Reactions  . Sulfonamide Derivatives        PHYSICAL EXAMINATION:  Vital signs: BP 108/70  Pulse 68  Ht 5\' 4"  (1.626 m)  Wt 203 lb 9.6 oz (92.352 kg)  BMI 34.95 kg/m2 General: Well-developed, well-nourished, no acute distress HEENT: Sclerae are anicteric,  conjunctiva pink. Oral mucosa intact Lungs: Clear Heart: Regular Abdomen: soft, nontender, nondistended, no obvious ascites, no peritoneal signs, normal bowel sounds. No organomegaly. Extremities: No edema Psychiatric: alert and oriented x3. Cooperative     ASSESSMENT:  #1. Mild Crohn's colitis. Asymptomatic on mesalamine #2. Celiac sprue. Asymptomatic despite dietary noncompliance #3. Last colonoscopy 2008   PLAN:  #1. Importance of gluten avoidance rest. Possible problems with ongoing gluten exposure include vitamin and mineral deficiency is due to malabsorption, and deficiency anemia, GI symptoms, and rarely intestinal lymphoma. She understands but does not appear willing to proceed with a gluten-free life style #2. Continue mesalamine #3. GI follow up in one year. Sooner if needed.

## 2011-03-21 NOTE — Patient Instructions (Signed)
Follow up  In 1 year

## 2011-03-26 ENCOUNTER — Encounter: Payer: Self-pay | Admitting: Family Medicine

## 2011-03-28 ENCOUNTER — Ambulatory Visit (HOSPITAL_COMMUNITY): Payer: BC Managed Care – PPO | Attending: Cardiology | Admitting: Radiology

## 2011-03-28 DIAGNOSIS — I379 Nonrheumatic pulmonary valve disorder, unspecified: Secondary | ICD-10-CM | POA: Insufficient documentation

## 2011-03-28 DIAGNOSIS — I080 Rheumatic disorders of both mitral and aortic valves: Secondary | ICD-10-CM | POA: Insufficient documentation

## 2011-03-28 DIAGNOSIS — I079 Rheumatic tricuspid valve disease, unspecified: Secondary | ICD-10-CM | POA: Insufficient documentation

## 2011-03-28 DIAGNOSIS — R011 Cardiac murmur, unspecified: Secondary | ICD-10-CM | POA: Insufficient documentation

## 2011-03-28 DIAGNOSIS — I10 Essential (primary) hypertension: Secondary | ICD-10-CM | POA: Insufficient documentation

## 2011-03-28 DIAGNOSIS — E119 Type 2 diabetes mellitus without complications: Secondary | ICD-10-CM | POA: Insufficient documentation

## 2011-03-28 DIAGNOSIS — E785 Hyperlipidemia, unspecified: Secondary | ICD-10-CM | POA: Insufficient documentation

## 2011-03-28 DIAGNOSIS — E669 Obesity, unspecified: Secondary | ICD-10-CM | POA: Insufficient documentation

## 2011-03-28 HISTORY — PX: TRANSTHORACIC ECHOCARDIOGRAM: SHX275

## 2011-03-28 LAB — BASIC METABOLIC PANEL
CO2: 25
Chloride: 105
Creatinine, Ser: 0.76
GFR calc Af Amer: >60

## 2011-03-31 ENCOUNTER — Other Ambulatory Visit: Payer: Self-pay | Admitting: Family Medicine

## 2011-04-01 ENCOUNTER — Telehealth: Payer: Self-pay

## 2011-04-01 NOTE — Telephone Encounter (Signed)
Pt aware.

## 2011-04-01 NOTE — Telephone Encounter (Signed)
Message copied by Beverely Low on Tue Apr 01, 2011 12:00 PM ------      Message from: Sheliah Hatch      Created: Fri Mar 28, 2011  2:56 PM       Murmur is innocent- small amount of backflow throw aortic valve.  This is nothing to worry about.

## 2011-04-05 ENCOUNTER — Other Ambulatory Visit: Payer: Self-pay | Admitting: Family Medicine

## 2011-04-06 ENCOUNTER — Other Ambulatory Visit: Payer: Self-pay | Admitting: Family Medicine

## 2011-04-07 ENCOUNTER — Other Ambulatory Visit: Payer: Self-pay | Admitting: Family Medicine

## 2011-04-07 NOTE — Telephone Encounter (Signed)
Rx done on 04/05/11

## 2011-04-24 ENCOUNTER — Other Ambulatory Visit: Payer: Self-pay | Admitting: Family Medicine

## 2011-04-30 ENCOUNTER — Other Ambulatory Visit: Payer: Self-pay | Admitting: Family Medicine

## 2011-04-30 NOTE — Telephone Encounter (Signed)
rx sent to pharmacy by e-script for Vit D

## 2011-05-24 ENCOUNTER — Other Ambulatory Visit: Payer: Self-pay | Admitting: Family Medicine

## 2011-05-27 NOTE — Telephone Encounter (Signed)
rx sent to pharmacy by e-script  

## 2011-06-17 ENCOUNTER — Other Ambulatory Visit: Payer: Self-pay | Admitting: Family Medicine

## 2011-06-17 NOTE — Telephone Encounter (Signed)
rx sent to pharmacy by e-script  

## 2011-06-20 ENCOUNTER — Other Ambulatory Visit: Payer: Self-pay | Admitting: Family Medicine

## 2011-06-20 ENCOUNTER — Other Ambulatory Visit: Payer: Self-pay | Admitting: *Deleted

## 2011-06-20 MED ORDER — NAPROXEN 500 MG PO TABS: 500.0000 mg | ORAL_TABLET | Freq: Two times a day (BID) | ORAL | Status: DC

## 2011-06-20 MED ORDER — NAPROXEN 500 MG PO TABS: 500.0000 mg | ORAL_TABLET | Freq: Every day | ORAL | Status: DC

## 2011-06-20 NOTE — Telephone Encounter (Signed)
Noted rx sent for naproxan should be for 2 times daily however sent for one time daily, corrected to pharmacist and noted in chart as 2 times daily

## 2011-06-20 NOTE — Telephone Encounter (Signed)
rx sent to pharmacy by e-script  

## 2011-06-22 ENCOUNTER — Other Ambulatory Visit: Payer: Self-pay | Admitting: Family Medicine

## 2011-07-14 ENCOUNTER — Encounter: Payer: Self-pay | Admitting: Family Medicine

## 2011-07-14 ENCOUNTER — Telehealth: Payer: Self-pay | Admitting: *Deleted

## 2011-07-14 ENCOUNTER — Ambulatory Visit (INDEPENDENT_AMBULATORY_CARE_PROVIDER_SITE_OTHER): Payer: BC Managed Care – PPO | Admitting: Family Medicine

## 2011-07-14 DIAGNOSIS — H6980 Other specified disorders of Eustachian tube, unspecified ear: Secondary | ICD-10-CM | POA: Insufficient documentation

## 2011-07-14 DIAGNOSIS — H6990 Unspecified Eustachian tube disorder, unspecified ear: Secondary | ICD-10-CM | POA: Insufficient documentation

## 2011-07-14 NOTE — Progress Notes (Signed)
  Subjective:    Patient ID: Sabrina [name], female    DOB: 1956/03/24, 56 y.o.   MRN: [id number]  HPI Ear pain- sxs started this weekend.  Initially on R but now bilateral.  No ear drainage, no fever.  + nasal congestion but no more than usual.  + sick contacts.  No recent air travel.   Review of Systems For ROS see HPI     Objective:   Physical Exam  Vitals reviewed. Constitutional: She appears well-developed and well-nourished. No distress.  HENT:  Head: Normocephalic and atraumatic.  Nose: Nose normal.  Mouth/Throat: Oropharynx is clear and moist. No oropharyngeal exudate.       TMs retracted bilaterally, R>L  Neck: Normal range of motion. Neck supple.  Cardiovascular: Normal rate and regular rhythm.   Pulmonary/Chest: Effort normal and breath sounds normal. No respiratory distress. She has no wheezes. She has no rales.  Lymphadenopathy:    She has no cervical adenopathy.          Assessment & Plan:

## 2011-07-14 NOTE — Patient Instructions (Signed)
This is called eustachian tube dysfunction Typically caused by nasal congestion Continue the nasal spray and claritin Add Sudafed for ~3 days- if you find this makes your heart race or you have other problems, stop and call me Drink plenty of fluids REST Hang in there!!!

## 2011-07-14 NOTE — Assessment & Plan Note (Signed)
No evidence of infxn causing pt's ear pain.  Reviewed dx and supportive care.  Pt expressed understanding and is in agreement w/ plan.

## 2011-07-14 NOTE — Telephone Encounter (Signed)
Called pt to advise that she has not had a routine OV since may to check her cholesterol, sugar and etc, set pt up for an appt on 08-18-11 at 10am, pt advised that she has her work do the blood work instead of here in our office and wanted to request that we send an order to her work [employer] to know what they need to test, pt did not have fax number for work and stated we had done this for her before and contact information should be in chart. Pt accepted appt

## 2011-07-21 ENCOUNTER — Other Ambulatory Visit: Payer: Self-pay | Admitting: Family Medicine

## 2011-07-21 MED ORDER — NAPROXEN 500 MG PO TABS: 500.0000 mg | ORAL_TABLET | Freq: Two times a day (BID) | ORAL | Status: DC

## 2011-07-21 NOTE — Telephone Encounter (Signed)
rx sent to pharmacy by e-script  

## 2011-08-06 ENCOUNTER — Other Ambulatory Visit: Payer: Self-pay | Admitting: Family Medicine

## 2011-08-06 MED ORDER — FLUTICASONE PROPIONATE 50 MCG/ACT NA SUSP: 2.0000 | Freq: Every day | NASAL | Status: DC

## 2011-08-06 NOTE — Telephone Encounter (Signed)
rx sent to pharmacy by e-script  

## 2011-08-11 ENCOUNTER — Telehealth: Payer: Self-pay | Admitting: *Deleted

## 2011-08-11 NOTE — Telephone Encounter (Signed)
For a physical she would need CBC, BMP, LFTs, TSH, fasting lipid panel, Vit D.

## 2011-08-11 NOTE — Telephone Encounter (Signed)
Pt called wanting to know if we can still send her lab orders to be drawn at her work and pt now has her work fax number to have the labs orders sent to as [phone], pt has upcoming apt on 08-18-11, please advise what labs to have drawn for pt

## 2011-08-11 NOTE — Telephone Encounter (Signed)
Faxed form stating the following tests to be drawn by pt employer:  CBC, BMP, LFTs, TSH, fasting lipid panel, Vit D. Per pt has CPE scheduled for 08-18-11 at 10am, pt aware the orders have been faxed

## 2011-08-18 ENCOUNTER — Encounter: Payer: Self-pay | Admitting: Family Medicine

## 2011-08-18 ENCOUNTER — Ambulatory Visit (INDEPENDENT_AMBULATORY_CARE_PROVIDER_SITE_OTHER): Payer: BC Managed Care – PPO | Admitting: Family Medicine

## 2011-08-18 DIAGNOSIS — Z0000 Encounter for general adult medical examination without abnormal findings: Secondary | ICD-10-CM

## 2011-08-18 MED ORDER — NAPROXEN 500 MG PO TABS: 500.0000 mg | ORAL_TABLET | Freq: Two times a day (BID) | ORAL | Status: DC

## 2011-08-18 NOTE — Patient Instructions (Signed)
Follow up in 6 months to recheck cholesterol, anemia You look great!  Keep up the good work! Please get your A1C and fax me the results Call with any questions or concerns [place]!!

## 2011-08-18 NOTE — Assessment & Plan Note (Signed)
Pt's PE WNL.  UTD on GYN, colonoscopy.  Reviewed pt's recent labs w/ her.  No changes at this time.  Anticipatory guidance provided.

## 2011-08-18 NOTE — Progress Notes (Signed)
  Subjective:    Patient ID: Sabrina [name], female    DOB: 1956/01/06, 56 y.o.   MRN: [id number]  HPI CPE- UTD on GYN w/ Dr Carrolyn Leigh.  UTD on colonoscopy w/ Dr Marina Goodell.  No concerns today.  Exercising regularly.  Watching diet closely.   Review of Systems Patient reports no vision/ hearing changes, adenopathy,fever, weight change,  persistant/recurrent hoarseness , swallowing issues, chest pain, palpitations, edema, persistant/recurrent cough, hemoptysis, dyspnea (rest/exertional/paroxysmal nocturnal), gastrointestinal bleeding (melena, rectal bleeding), abdominal pain, significant heartburn, bowel changes, GU symptoms (dysuria, hematuria, incontinence), Gyn symptoms (abnormal  bleeding, pain),  syncope, focal weakness, memory loss, numbness & tingling, skin/hair/nail changes, abnormal bruising or bleeding, anxiety, or depression.     Objective:   Physical Exam General Appearance:    Alert, cooperative, no distress, appears stated age  Head:    Normocephalic, without obvious abnormality, atraumatic  Eyes:    PERRL, conjunctiva/corneas clear, EOM's intact, fundi    benign, both eyes  Ears:    Normal TM's and external ear canals, both ears  Nose:   Nares normal, septum midline, mucosa normal, no drainage    or sinus tenderness  Throat:   Lips, mucosa, and tongue normal; teeth and gums normal  Neck:   Supple, symmetrical, trachea midline, no adenopathy;    Thyroid: no enlargement/tenderness/nodules  Back:     Symmetric, no curvature, ROM normal, no CVA tenderness  Lungs:     Clear to auscultation bilaterally, respirations unlabored  Chest Wall:    No tenderness or deformity   Heart:    Regular rate and rhythm, S1 and S2 normal, I-II/VI SEM  Breast Exam:    Deferred to GYN  Abdomen:     Soft, non-tender, bowel sounds active all four quadrants,    no masses, no organomegaly  Genitalia:    Deferred to GYN  Rectal:    Extremities:   Extremities normal, atraumatic, no cyanosis or edema    Pulses:   2+ and symmetric all extremities  Skin:   Skin color, texture, turgor normal, no rashes or lesions  Lymph nodes:   Cervical, supraclavicular, and axillary nodes normal  Neurologic:   CNII-XII intact, normal strength, sensation and reflexes    throughout          Assessment & Plan:

## 2011-08-19 MED ORDER — NAPROXEN 500 MG PO TABS: 500.0000 mg | ORAL_TABLET | Freq: Two times a day (BID) | ORAL | Status: DC

## 2011-08-19 NOTE — Progress Notes (Signed)
Addended by: Candie Echevaria L on: 08/19/2011 08:27 AM   Modules accepted: Orders

## 2011-09-17 ENCOUNTER — Ambulatory Visit (INDEPENDENT_AMBULATORY_CARE_PROVIDER_SITE_OTHER): Payer: BC Managed Care – PPO | Admitting: Internal Medicine

## 2011-09-17 ENCOUNTER — Encounter: Payer: Self-pay | Admitting: Internal Medicine

## 2011-09-17 VITALS — BP 114/76 | HR 91 | Temp 98.0°F | Wt 186.2 lb

## 2011-09-17 DIAGNOSIS — N390 Urinary tract infection, site not specified: Secondary | ICD-10-CM

## 2011-09-17 LAB — POCT URINALYSIS DIPSTICK
Glucose, UA: NEGATIVE
pH, UA: 6

## 2011-09-17 MED ORDER — NITROFURANTOIN MONOHYD MACRO 100 MG PO CAPS: 100.0000 mg | ORAL_CAPSULE | Freq: Two times a day (BID) | ORAL | Status: AC

## 2011-09-17 NOTE — Patient Instructions (Signed)
Drink as much nondairy fluids as possible. Avoid spicy foods or alcohol as  these may aggravate the prostatsymptoms. Do not take decongestants. Avoid narcotics if possible.

## 2011-09-17 NOTE — Progress Notes (Signed)
  Subjective:    Patient ID: Sabrina [name], female    DOB: April 03, 1956, 56 y.o.   MRN: [id number]  HPI Symptoms began 09/14/11 as dysuria with termination of urination. She denies hematuria or pyuria. She's also had no fever, chills, sweats, or flank pain.  She has not had frequent urinary tract infections but is followed by a urologist for interstitial cystitis.  She has had cystoscopy on 2 occasions and bladder biopsy.  She has a history of classic angioedema with sulfa    Review of Systems  Her fasting blood sugars have been averaging approximately 99     Objective:   Physical Exam   She appears healthy and well-nourished and in no distress  Sclera are  clear without conjunctivitis or icterus  She has no lymphadenopathy about the neck or axilla.  Lung bases are clear with no increased work of breathing  There is a grade 1 systolic murmur; rhythm and rate are normal  Abdomen reveals no organomegaly or masses; slight suprapubic tenderness  There is no pain to percussion of the flanks  Straight leg raising is negative        Assessment & Plan:  #1 dysuria, probable urinary tract infection  Plan: As per standard of care she'll be placed on nitrofurantoin pending results of the culture and sensitivity.

## 2011-09-20 LAB — URINE CULTURE: Colony Count: >=100000

## 2011-09-25 ENCOUNTER — Encounter: Payer: Self-pay | Admitting: Family Medicine

## 2011-09-25 ENCOUNTER — Ambulatory Visit (INDEPENDENT_AMBULATORY_CARE_PROVIDER_SITE_OTHER): Payer: BC Managed Care – PPO | Admitting: Family Medicine

## 2011-09-25 VITALS — BP 122/79 | HR 99 | Temp 98.0°F | Ht 64.75 in | Wt 185.4 lb

## 2011-09-25 DIAGNOSIS — N3941 Urge incontinence: Secondary | ICD-10-CM

## 2011-09-25 DIAGNOSIS — N390 Urinary tract infection, site not specified: Secondary | ICD-10-CM | POA: Insufficient documentation

## 2011-09-25 LAB — POCT URINALYSIS DIPSTICK
Nitrite, UA: NEGATIVE
Urobilinogen, UA: 0.2
pH, UA: 6.5

## 2011-09-25 MED ORDER — CIPROFLOXACIN HCL 500 MG PO TABS: 500.0000 mg | ORAL_TABLET | Freq: Two times a day (BID) | ORAL | Status: AC

## 2011-09-25 NOTE — Progress Notes (Signed)
  Subjective:    Patient ID: Sabrina [name], female    DOB: 01/02/1956, 55 y.o.   MRN: [id number]  HPI ? UTI- recently treated for UTI w/ macrobid.  Took last pill this AM but sxs are not better, they are worsening.  Having dysuria, frequency, feeling of incomplete emptying.  Has hx of IC but feels this is not similar to previous flares.  No fevers.  No back pain or CVA tenderness but + suprapubic pain.   Review of Systems For ROS see HPI     Objective:   Physical Exam  Vitals reviewed. Constitutional: She appears well-developed and well-nourished. No distress.  Abdominal: Soft. She exhibits no distension. There is tenderness (+ suprapubic tenderness, no CVA tenderness).          Assessment & Plan:

## 2011-09-25 NOTE — Patient Instructions (Signed)
This appears to be another infection Start the cipro twice daily Drink plenty of fluids We'll call [facility] to try and locate your lab Happy [place]!!!

## 2011-09-28 NOTE — Assessment & Plan Note (Signed)
Pt's sxs and PE consistent w/ infxn.  Start abx.  If cx results negative must consider IC flare and pt will need uro f/u.  Reviewed supportive care and red flags that should prompt return.  Pt expressed understanding and is in agreement w/ plan.

## 2011-10-24 ENCOUNTER — Other Ambulatory Visit: Payer: Self-pay | Admitting: Family Medicine

## 2011-10-24 MED ORDER — HYDROCHLOROTHIAZIDE 25 MG PO TABS: 25.0000 mg | ORAL_TABLET | Freq: Every day | ORAL | Status: DC

## 2011-10-24 NOTE — Telephone Encounter (Signed)
[facility] recv[phone] for Refill for HCTZ 25  Based on chart looks like Hydrochlorothiazide (Tab) HYDRODIURIL 25 MG  TAKE ONE TABLET BY MOUTH ONE TIME DAILY Last written 12.23.12, qty 30 Last OV 3.28.2013

## 2011-11-03 ENCOUNTER — Other Ambulatory Visit: Payer: Self-pay | Admitting: Family Medicine

## 2011-11-03 MED ORDER — NAPROXEN 500 MG PO TABS: 500.0000 mg | ORAL_TABLET | Freq: Two times a day (BID) | ORAL | Status: DC

## 2011-11-03 MED ORDER — ONDANSETRON HCL 4 MG PO TABS: 4.0000 mg | ORAL_TABLET | Freq: Three times a day (TID) | ORAL | Status: AC | PRN

## 2011-11-03 NOTE — Telephone Encounter (Signed)
refill naproxen 500mg  tablet Qty 60 Take one tablet by mouth twice a day with a meal  Last filled 3.25.13 Last ov 3.28.13

## 2011-11-03 NOTE — Telephone Encounter (Signed)
Ok for Zofran 4mg  Q8 PRN, #20, no refills

## 2011-11-03 NOTE — Telephone Encounter (Signed)
Pt left vm stating that she will be traveling tomorrow and wants a RX for Zofran per noted that Phenergan does help her, please advise

## 2011-11-03 NOTE — Telephone Encounter (Signed)
rx sent to pharmacy by e-script Pt aware 

## 2011-11-14 ENCOUNTER — Other Ambulatory Visit: Payer: Self-pay | Admitting: Urology

## 2011-11-18 ENCOUNTER — Encounter (HOSPITAL_BASED_OUTPATIENT_CLINIC_OR_DEPARTMENT_OTHER): Payer: Self-pay | Admitting: *Deleted

## 2011-11-19 ENCOUNTER — Encounter (HOSPITAL_BASED_OUTPATIENT_CLINIC_OR_DEPARTMENT_OTHER): Payer: Self-pay | Admitting: *Deleted

## 2011-11-19 NOTE — Progress Notes (Signed)
To [facility] at 1045-Istat on arrival,Ekg with chart-dated, but check with anesthesia if repeat needed.Npo after mn-may  take ativan,lialda with sip water that am.

## 2011-11-21 NOTE — H&P (Signed)
Sabrina [name] returns for a follow-up. Some of her history is summarized below. She tells me she was documented to have a urinary tract infection about 2 months ago. Since that time she has had her urine reassessed and it has been clear including today's specimen. She has, however, had lingering symptoms of increased frequency, pressure, some discomfort after voiding and a sensation that she is not emptying completely. She is interested in consideration for a repeat hydraulic overdistention of the bladder which she has had on two occasions which have helped quite a bit. The last was done a little over 3 years ago.    History of Present Illness     Sabrina [name] returns for routine follow-up.  She is felt to have interstitial cystitis as well as recurrent bacterial cystitis.  She underwent a hydraulic overdistention of her bladder approximately 8-10 years ago, and then this was repeated back in January of 2010.  Excellent bladder capacity at 1500 cc, but diffuse glomerular hemorrhaging consistent with interstitial cystitis.  She has had excellent improvement in her symptoms with that procedure.  She feels like urination is back to baseline and she is pleased with the outcome of that procedure.  She remains on Elmiron 1 tablet twice a day.  No new complaints or problems today.  Doing well.     Sabrina [name] returns for routine follow-up.  She is felt to have interstitial cystitis as well as recurrent bacterial cystitis.  She underwent a hydraulic overdistention of her bladder approximately 8-10 years ago, and then this was repeated back in January of 2010.  Excellent bladder capacity at 1500 cc, but diffuse glomerular hemorrhaging consistent with interstitial cystitis.  She has had excellent improvement in her symptoms with that procedure.  She feels like urination is back to baseline and she is pleased with the outcome of that procedure.  She remains on Elmiron 1 tablet twice a day.  No new complaints or problems today.   Doing well.   Past Medical History Problems  1. History of  Anxiety (Symptom) 300.00 2. History of  Arthritis V13.4 3. History of  Bladder Irrigation 4. History of  Depression 311 5. History of  Diabetes Mellitus 250.00 6. History of  Hypertension 401.9 7. History of  Murmurs 785.2 8. 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 History of  Adenoidectomy 2. History of  Appendectomy 3. History of  Back Surgery 4. History of  Cesarean Section 5. History of  Cystoscopy With Dilation Of Bladder 6. History of  Hand Surgery Bilateral 7. History of  Shoulder Surgery Right 8. History of  Tonsillectomy 9. History of  Tubal Ligation V25.2  Current Meds 1. Abilify 10 MG Oral Tablet; Therapy: (Recorded:14Nov2008) to 2. Allegra TABS; Therapy: (Recorded:14Nov2008) to 3. Calcium + D TABS; Therapy: (Recorded:14Nov2008) to 4. Flonase SUSP; Therapy: (Recorded:14Nov2008) to 5. FLUoxetine HCl 40 MG Oral Capsule; Therapy: 03May2012 to 6. Folic Acid CAPS; Therapy: (Recorded:14Nov2008) to 7. Glucosamine CAPS; Therapy: (Recorded:14Nov2008) to 8. Hydrochlorothiazide TABS; 25mg  QD; Therapy: (Recorded:14Nov2008) to 9. LaMICtal 200 MG Oral Tablet; Therapy: (Recorded:14Nov2008) to 10. Lialda 1.2 GM Oral Tablet Delayed Release; Therapy: 19Aug2011 to 11. LORazepam TABS; 0.5mg ; Therapy: (Recorded:14Nov2008) to 12. Naproxen 500 MG Oral Tablet; Therapy: 28Jun2012 to 13. Poly-Iron 150 150 MG Oral Capsule; Therapy: (Recorded:14Nov2008) to 14. TraZODone HCl TABS; 25mg ; Therapy: (Recorded:14Nov2008) to 15. Vitamin B12 TABS; Therapy: (Recorded:14Nov2008) to 16. Vitamin D (Ergocalciferol) 50000 UNIT Oral Capsule; Therapy: 30May2012 to  Allergies Medication  1. Sulfa Drugs  Family History Problems  1. Maternal history of  Blood Disorders V18.3 2. Family history of  Family Health Status Number Of Children 1 daughter 3. Maternal history of  Renal Failure  Social  History Problems  1. Alcohol Use 2. Caffeine Use 5 QD 3. Death In The Family Father 87yrs, pneumonia 4. Death In The Family Mother 37yrs, renal faliure 5. Never A Smoker 6. Occupation: medical transcription  Review of Systems Genitourinary, constitutional, skin, eye, otolaryngeal, hematologic/lymphatic, cardiovascular, pulmonary, endocrine, musculoskeletal, gastrointestinal, neurological and psychiatric system(s) were reviewed and pertinent findings if present are noted.  Genitourinary: urinary frequency, incomplete emptying of bladder and suprapubic pain, but no urinary urgency, no dysuria and no pelvic pain.  Gastrointestinal: no melena.  Cardiovascular: no chest pain.  Respiratory: no shortness of breath and no cough.    Vitals Vital Signs [Data Includes: Last 1 Day]  16May2013 04:06PM  Blood Pressure: 104 / 70 Temperature: 98 F  Results/Data Urine [Data Includes: Last 1 Day]   16May2013  COLOR YELLOW   APPEARANCE CLEAR   SPECIFIC GRAVITY 1.010   pH 5.5   GLUCOSE NEG mg/dL  BILIRUBIN NEG   KETONE NEG mg/dL  BLOOD NEG   PROTEIN NEG mg/dL  UROBILINOGEN 0.2 mg/dL  NITRITE NEG   LEUKOCYTE ESTERASE NEG    Assessment Assessed  1. Chronic Interstitial Cystitis 595.1 2. Chronic Cystitis 595.2  Plan Chronic Interstitial Cystitis (595.1)  1. Follow-up Schedule Surgery Office  Follow-up  Done: 16May2013 Health Maintenance (V70.0)  2. UA With REFLEX  Done: 16May2013 03:37PM  Discussion/Summary Sabrina [name] has had a recent flare of her interstitial cystitis now ongoing for 6-8 weeks.  She is interested in a repeat hydraulic overdistension of the bladder, which I think is reasonable.  We will try to get this on schedule for her in the next several weeks if possible.  She is interested in having something done later this month.   Additional examination: Gen. Well-developed well-nourished female in no acute distress Pulmonary normal respiratory effort Cardiac: Rate and  rhythm nl Abdomen: Soft nontender no palpable masses. Extremities: Nontender no edema.

## 2011-11-26 ENCOUNTER — Encounter (HOSPITAL_BASED_OUTPATIENT_CLINIC_OR_DEPARTMENT_OTHER)
Admission: RE | Disposition: A | Discharge status: Home / Self Care | Payer: Self-pay | Source: Ambulatory Visit | Attending: Urology

## 2011-11-26 ENCOUNTER — Encounter (HOSPITAL_BASED_OUTPATIENT_CLINIC_OR_DEPARTMENT_OTHER): Payer: Self-pay | Admitting: Certified Registered"

## 2011-11-26 ENCOUNTER — Encounter (HOSPITAL_BASED_OUTPATIENT_CLINIC_OR_DEPARTMENT_OTHER): Payer: Self-pay | Admitting: *Deleted

## 2011-11-26 ENCOUNTER — Ambulatory Visit (HOSPITAL_BASED_OUTPATIENT_CLINIC_OR_DEPARTMENT_OTHER)
Admission: RE | Admit: 2011-11-26 | Discharge: 2011-11-26 | Disposition: A | Discharge status: Home / Self Care | Payer: BC Managed Care – PPO | Source: Ambulatory Visit | Attending: Urology | Admitting: Urology

## 2011-11-26 ENCOUNTER — Ambulatory Visit (HOSPITAL_BASED_OUTPATIENT_CLINIC_OR_DEPARTMENT_OTHER): Payer: BC Managed Care – PPO | Admitting: Certified Registered"

## 2011-11-26 DIAGNOSIS — N3010 Interstitial cystitis (chronic) without hematuria: Secondary | ICD-10-CM | POA: Insufficient documentation

## 2011-11-26 DIAGNOSIS — I10 Essential (primary) hypertension: Secondary | ICD-10-CM | POA: Insufficient documentation

## 2011-11-26 DIAGNOSIS — Z79899 Other long term (current) drug therapy: Secondary | ICD-10-CM | POA: Insufficient documentation

## 2011-11-26 DIAGNOSIS — E119 Type 2 diabetes mellitus without complications: Secondary | ICD-10-CM | POA: Insufficient documentation

## 2011-11-26 HISTORY — PX: CYSTOSCOPY: SHX5120

## 2011-11-26 HISTORY — DX: Interstitial cystitis (chronic) without hematuria: N30.10

## 2011-11-26 LAB — POCT I-STAT 4, (NA,K, GLUC, HGB,HCT)
Glucose, Bld: 127 mg/dL — ABNORMAL HIGH (ref 70–99)
HCT: 36 % (ref 36.0–46.0)
Hemoglobin: 12.2 g/dL (ref 12.0–15.0)
Sodium: 141 mEq/L (ref 135–145)

## 2011-11-26 SURGERY — CYSTOSCOPY
Anesthesia: General | Site: Bladder | Wound class: Clean Contaminated

## 2011-11-26 MED ORDER — BUPIVACAINE HCL (PF) 0.25 % IJ SOLN
INTRAMUSCULAR | Status: DC | PRN
  Administered 2011-11-26: 30 mL

## 2011-11-26 MED ORDER — MIDAZOLAM HCL 5 MG/5ML IJ SOLN
INTRAMUSCULAR | Status: DC | PRN
  Administered 2011-11-26: 2 mg via INTRAVENOUS

## 2011-11-26 MED ORDER — HYDROCODONE-ACETAMINOPHEN 5-325 MG PO TABS: 1.0000 | ORAL_TABLET | Freq: Four times a day (QID) | ORAL | Status: AC | PRN

## 2011-11-26 MED ORDER — ONDANSETRON HCL 4 MG/2ML IJ SOLN
INTRAMUSCULAR | Status: DC | PRN
  Administered 2011-11-26: 4 mg via INTRAVENOUS

## 2011-11-26 MED ORDER — PROPOFOL 10 MG/ML IV EMUL
INTRAVENOUS | Status: DC | PRN
  Administered 2011-11-26: 130 mg via INTRAVENOUS

## 2011-11-26 MED ORDER — STERILE WATER FOR IRRIGATION IR SOLN
Status: DC | PRN
  Administered 2011-11-26: 3000 mL

## 2011-11-26 MED ORDER — LACTATED RINGERS IV SOLN
INTRAVENOUS | Status: DC
  Administered 2011-11-26: 11:00:00 via INTRAVENOUS

## 2011-11-26 MED ORDER — BELLADONNA ALKALOIDS-OPIUM 16.2-60 MG RE SUPP
RECTAL | Status: DC | PRN
  Administered 2011-11-26: 1 via RECTAL

## 2011-11-26 MED ORDER — PROMETHAZINE HCL 25 MG/ML IJ SOLN: 6.2500 mg | INTRAMUSCULAR | Status: DC | PRN

## 2011-11-26 MED ORDER — LIDOCAINE HCL 2 % EX GEL
CUTANEOUS | Status: DC | PRN
  Administered 2011-11-26: 1 via URETHRAL

## 2011-11-26 MED ORDER — FENTANYL CITRATE 0.05 MG/ML IJ SOLN
INTRAMUSCULAR | Status: DC | PRN
  Administered 2011-11-26 (×2): 50 ug via INTRAVENOUS

## 2011-11-26 MED ORDER — CIPROFLOXACIN IN D5W 400 MG/200ML IV SOLN
400.0000 mg | INTRAVENOUS | Status: AC
  Administered 2011-11-26: 400 mg via INTRAVENOUS

## 2011-11-26 MED ORDER — LACTATED RINGERS IV SOLN: INTRAVENOUS | Status: DC

## 2011-11-26 MED ORDER — LIDOCAINE HCL (CARDIAC) 20 MG/ML IV SOLN
INTRAVENOUS | Status: DC | PRN
  Administered 2011-11-26: 50 mg via INTRAVENOUS

## 2011-11-26 MED ORDER — OXYCHLOROSENE SODIUM POWD
Status: DC | PRN
  Administered 2011-11-26: 1

## 2011-11-26 MED ORDER — FENTANYL CITRATE 0.05 MG/ML IJ SOLN: 25.0000 ug | INTRAMUSCULAR | Status: DC | PRN

## 2011-11-26 MED ORDER — DEXAMETHASONE SODIUM PHOSPHATE 4 MG/ML IJ SOLN
INTRAMUSCULAR | Status: DC | PRN
  Administered 2011-11-26: 4 mg via INTRAVENOUS

## 2011-11-26 MED ORDER — URIBEL 118 MG PO CAPS: 1.0000 | ORAL_CAPSULE | Freq: Three times a day (TID) | ORAL | Status: DC | PRN

## 2011-11-26 MED ORDER — HYDROCODONE-ACETAMINOPHEN 5-325 MG PO TABS
1.0000 | ORAL_TABLET | Freq: Four times a day (QID) | ORAL | Status: AC | PRN
  Administered 2011-11-26: 1 via ORAL

## 2011-11-26 SURGICAL SUPPLY — 20 items
BAG DRAIN URO-CYSTO SKYTR STRL (DRAIN) ×2 IMPLANT
BAG DRN UROCATH (DRAIN) ×1
CANISTER SUCT LVC 12 LTR MEDI- (MISCELLANEOUS) ×2 IMPLANT
CATH ROBINSON RED A/P 14FR (CATHETERS) ×2 IMPLANT
CATH ROBINSON RED A/P 16FR (CATHETERS) IMPLANT
CLOTH BEACON ORANGE TIMEOUT ST (SAFETY) ×2 IMPLANT
DRAPE CAMERA CLOSED 9X96 (DRAPES) ×2 IMPLANT
ELECT REM PT RETURN 9FT ADLT (ELECTROSURGICAL) ×2
ELECTRODE REM PT RTRN 9FT ADLT (ELECTROSURGICAL) ×1 IMPLANT
GLOVE BIO SURGEON STRL SZ7.5 (GLOVE) ×2 IMPLANT
GLOVE ECLIPSE 6.0 STRL STRAW (GLOVE) ×2 IMPLANT
GOWN STRL REIN XL XLG (GOWN DISPOSABLE) ×2 IMPLANT
NDL SAFETY ECLIPSE 18X1.5 (NEEDLE) IMPLANT
NEEDLE HYPO 18GX1.5 SHARP (NEEDLE)
NEEDLE HYPO 22GX1.5 SAFETY (NEEDLE) IMPLANT
NS IRRIG 500ML POUR BTL (IV SOLUTION) ×2 IMPLANT
PACK CYSTOSCOPY (CUSTOM PROCEDURE TRAY) ×2 IMPLANT
SYR 20CC LL (SYRINGE) ×4 IMPLANT
SYR BULB IRRIGATION 50ML (SYRINGE) ×1 IMPLANT
WATER STERILE IRR 3000ML UROMA (IV SOLUTION) ×2 IMPLANT

## 2011-11-26 NOTE — Interval H&P Note (Signed)
History and Physical Interval Note:  11/26/2011 12:05 PM  Sabrina [name]  has presented today for surgery, with the diagnosis of INTERSTITIAL CYSTITIS  The various methods of treatment have been discussed with the patient and family. After consideration of risks, benefits and other options for treatment, the patient has consented to  Procedure(s) (LRB): CYSTOSCOPY (N/A) as a surgical intervention .  The patients' history has been reviewed, patient examined, no change in status, stable for surgery.  I have reviewed the patients' chart and labs.  Questions were answered to the patient's satisfaction.     Juwann Sherk S

## 2011-11-26 NOTE — Discharge Instructions (Signed)

## 2011-11-26 NOTE — Transfer of Care (Signed)
Immediate Anesthesia Transfer of Care Note  [name]  Procedure(s) Performed: Procedure(s) (LRB): CYSTOSCOPY (N/A)  Patient Location: PACU  Anesthesia Type: General  Level of Consciousness: awake, oriented, sedated and patient cooperative  Airway & Oxygen Therapy: Patient Spontanous Breathing and Patient connected to face mask oxygen  Post-op Assessment: Report given to PACU RN and Post -op Vital signs reviewed and stable  Post vital signs: Reviewed and stable  Complications: No apparent anesthesia complications

## 2011-11-26 NOTE — Op Note (Signed)
Preoperative diagnosis:  Interstitial Cystitis Postoperative diagnosis: Same Procedure:  Cystoscopy and hydraulic overdistention of the bladder with instillation of Clorpactin and Marcaine Surgeon Valetta Fuller, MD Anesthesia: General  Indication: Sabrina [name] has a long-standing history of interstitial cystitis.She  has had a recent flare and requested repeat hydraulic overdistention of the bladder.  Technique and findings:   The patient was brought to the operating room where [facility] successful induction of general anesthesia.The patient was placed in lithotomy position and prepped and draped in usual manner. Appropriate surgical timeout was performed.The patient received perioperative antibiotics. Initial cystoscopy revealed no abnormalities of the bladder. The patient underwent hydraulic overdistention of the bladder to 100 cm of water pressure for 5 minutes. Capacity was felt to be 1100 cc. The patient had diffuse 4+ glomerular bleeding noted consistent with the diagnosis of interstitial cystitis. The patient had instillation of Clorpactin in a standard manner with gravity administration. Marcaine was then instilled in the bladder.The patient was brought to recovery room in stable condition having no obvious complications or problems.   Valetta Fuller, MD 11/26/2011, 12:43 PM

## 2011-11-26 NOTE — Anesthesia Preprocedure Evaluation (Signed)
Anesthesia Evaluation  Patient identified by MRN, date of birth, ID band Patient awake    Reviewed: Allergy & Precautions, H&P , NPO status , Patient's Chart, lab work & pertinent test results  History of Anesthesia Complications Negative for: history of anesthetic complications  Airway Mallampati: II TM Distance: >3 FB Neck ROM: Full    Dental  (+) Teeth Intact and Dental Advisory Given   Pulmonary neg pulmonary ROS,    Pulmonary exam normal       Cardiovascular hypertension, Pt. on medications + Cardiac Defibrillator + Valvular Problems/Murmurs AI Rhythm:Regular Rate:Normal     Neuro/Psych PSYCHIATRIC DISORDERS Bipolar Disorder negative neurological ROS     GI/Hepatic negative GI ROS, Neg liver ROS, Celiac disease   Endo/Other  Diabetes mellitus- (States DM has resolved with weight loss)  Renal/GU negative Renal ROS  negative genitourinary   Musculoskeletal negative musculoskeletal ROS (+)   Abdominal   Peds  Hematology negative hematology ROS (+)   Anesthesia Other Findings   Reproductive/Obstetrics negative OB ROS                           Anesthesia Physical Anesthesia Plan  ASA: II  Anesthesia Plan: General   Post-op Pain Management:    Induction: Intravenous  Airway Management Planned: LMA  Additional Equipment:   Intra-op Plan:   Post-operative Plan: Extubation in OR  Informed Consent: I have reviewed the patients History and Physical, chart, labs and discussed the procedure including the risks, benefits and alternatives for the proposed anesthesia with the patient or authorized representative who has indicated his/her understanding and acceptance.   Dental advisory given  Plan Discussed with: CRNA  Anesthesia Plan Comments:         Anesthesia Quick Evaluation

## 2011-11-27 ENCOUNTER — Encounter (HOSPITAL_BASED_OUTPATIENT_CLINIC_OR_DEPARTMENT_OTHER): Payer: Self-pay | Admitting: Urology

## 2011-11-27 NOTE — Anesthesia Postprocedure Evaluation (Signed)
Anesthesia Post Note  [name]  Procedure(s) Performed: Procedure(s) (LRB): CYSTOSCOPY (N/A)  Anesthesia type: General  Patient location: PACU  Post pain: Pain level controlled  Post assessment: Post-op Vital signs reviewed  Last Vitals:  Filed Vitals:   11/26/11 1450  BP: 104/65  Pulse: 70  Temp: 36.3 C  Resp: 20    Post vital signs: Reviewed  Level of consciousness: sedated  Complications: No apparent anesthesia complications

## 2011-12-09 ENCOUNTER — Other Ambulatory Visit: Payer: Self-pay | Admitting: Family Medicine

## 2011-12-09 MED ORDER — VITAMIN D (ERGOCALCIFEROL) 1.25 MG (50000 UNIT) PO CAPS: 50000.0000 [IU] | ORAL_CAPSULE | ORAL | Status: DC

## 2011-12-09 NOTE — Telephone Encounter (Signed)
refill vit d2 1.25mg  (50,000unit) Qty 4 Take one capsule by mouth weekly  Last fill 5.5.13 Last ov 3.28.13

## 2011-12-09 NOTE — Telephone Encounter (Signed)
rx sent to pharmacy by e-script  

## 2011-12-22 ENCOUNTER — Encounter: Payer: Self-pay | Admitting: Family Medicine

## 2011-12-22 ENCOUNTER — Telehealth: Payer: Self-pay | Admitting: Family Medicine

## 2011-12-22 ENCOUNTER — Ambulatory Visit (INDEPENDENT_AMBULATORY_CARE_PROVIDER_SITE_OTHER): Payer: BC Managed Care – PPO | Admitting: Family Medicine

## 2011-12-22 VITALS — BP 115/78 | HR 78 | Temp 98.2°F | Ht 64.25 in | Wt 168.2 lb

## 2011-12-22 DIAGNOSIS — K120 Recurrent oral aphthae: Secondary | ICD-10-CM | POA: Insufficient documentation

## 2011-12-22 MED ORDER — POLYSACCHARIDE IRON COMPLEX 150 MG PO CAPS: 150.0000 mg | ORAL_CAPSULE | Freq: Two times a day (BID) | ORAL | Status: DC

## 2011-12-22 MED ORDER — TRIAMCINOLONE ACETONIDE 0.1 % MT PSTE: PASTE | OROMUCOSAL | Status: DC

## 2011-12-22 NOTE — Telephone Encounter (Signed)
Called pt to clarify the correct pharmacy to send the [facility], pt stated she still wants [facility], pt understood and medication sent via escribe

## 2011-12-22 NOTE — Patient Instructions (Addendum)
This is recurrent aphthous ulcer Use the Triamcinolone as directed- it will form grey layer over mouth sores Call with any questions or concerns Hang in there!!!

## 2011-12-22 NOTE — Progress Notes (Signed)
  Subjective:    Patient ID: Sabrina [name], female    DOB: 07/09/1955, 56 y.o.   MRN: [id number]  HPI Mouth sores- sxs started Saturday.  Hx of similar but 'never this bad'.  + burning, tingling.  Pain is constant.  'hard to see unless you know what you're looking at'.  Lesions are on the tongue and inner cheeks bilaterally.  + recent abx use (Cipro for UTI- has taken previously w/out sore)  Pt describes areas as 'blisters'.  No similar areas on hands or feet.  Otherwise feeling well.   Review of Systems For ROS see HPI     Objective:   Physical Exam  Vitals reviewed. Constitutional: She appears well-developed and well-nourished. No distress.  HENT:       Multiple aphthous ulcers scattered on cheeks, tongue, floor of mouth          Assessment & Plan:

## 2011-12-22 NOTE — Telephone Encounter (Signed)
Refill: Poly-Iron 150mg . Take one capsule by mouth twice daily. Qty 60. Last fill 11-03-11

## 2011-12-23 ENCOUNTER — Encounter: Payer: Self-pay | Admitting: Family Medicine

## 2011-12-23 NOTE — Assessment & Plan Note (Signed)
New.  Pt's PE consistent w/ ulcers.  After reviewing UpToDate, there is an association between Celiac disease and recurrent ulcers.  Start Triamcinolone in [place].  Reviewed supportive care and red flags that should prompt return.  Pt expressed understanding and is in agreement w/ plan.

## 2011-12-29 ENCOUNTER — Telehealth: Payer: Self-pay | Admitting: Family Medicine

## 2011-12-29 MED ORDER — HYDROCHLOROTHIAZIDE 25 MG PO TABS: 25.0000 mg | ORAL_TABLET | Freq: Every day | ORAL | Status: DC

## 2011-12-29 NOTE — Telephone Encounter (Signed)
Refill: Hydrochlorothiazide 25mg  tab. Take 1 tablet by mouth daily. Qty 30. Last fill 11-30-11

## 2011-12-29 NOTE — Telephone Encounter (Signed)
rx sent to pharmacy by e-script  

## 2012-01-19 ENCOUNTER — Other Ambulatory Visit: Payer: Self-pay | Admitting: Family Medicine

## 2012-01-19 NOTE — Telephone Encounter (Signed)
Refill Naproxen (Tab) NAPROSYN 500 MG Take 1 tablet (500 mg total) by mouth 2 (two) times daily with a meal. #60, last fill 6.17.13 Last ov 6.24.13 (acute)

## 2012-01-20 NOTE — Telephone Encounter (Signed)
Last OV 12-22-11, last refill 11-23-11 #60 with 1 refill, noted given during CPE noted on 08-18-11 unable to determine reason for medication

## 2012-01-21 MED ORDER — NAPROXEN 500 MG PO TABS: 500.0000 mg | ORAL_TABLET | Freq: Two times a day (BID) | ORAL | Status: DC

## 2012-01-21 NOTE — Telephone Encounter (Signed)
Ok for #60, 1 refill 

## 2012-01-21 NOTE — Telephone Encounter (Signed)
rx sent to pharmacy by e-script  

## 2012-02-13 ENCOUNTER — Other Ambulatory Visit: Payer: Self-pay | Admitting: Internal Medicine

## 2012-02-16 MED ORDER — MESALAMINE 1.2 G PO TBEC: 1200.0000 mg | DELAYED_RELEASE_TABLET | Freq: Two times a day (BID) | ORAL | Status: DC

## 2012-02-16 NOTE — Telephone Encounter (Signed)
Refilled Lialda and reminded patient she is due for an office visit

## 2012-02-25 ENCOUNTER — Other Ambulatory Visit: Payer: Self-pay | Admitting: Family Medicine

## 2012-02-25 NOTE — Telephone Encounter (Signed)
rx sent to pharmacy by e-script  

## 2012-02-27 ENCOUNTER — Other Ambulatory Visit: Payer: Self-pay | Admitting: Family Medicine

## 2012-03-02 NOTE — Telephone Encounter (Signed)
rx sent to pharmacy by e-script  

## 2012-03-26 ENCOUNTER — Telehealth: Payer: Self-pay | Admitting: Family Medicine

## 2012-03-26 MED ORDER — NAPROXEN 500 MG PO TABS: 500.0000 mg | ORAL_TABLET | Freq: Two times a day (BID) | ORAL | Status: DC

## 2012-03-26 NOTE — Telephone Encounter (Signed)
Last OV 12-22-11 and last refill 01-21-12 #60 with 1 refill

## 2012-03-26 NOTE — Telephone Encounter (Signed)
rx sent to pharmacy by e-script  

## 2012-03-26 NOTE — Telephone Encounter (Signed)
Ok for #60, 1 refill 

## 2012-03-26 NOTE — Telephone Encounter (Signed)
Refill: Naproxen 500mg  tablet. Take 1 tablet by mouth 2 times daily with a meal. Qty 60. Last fill 02-22-12

## 2012-04-09 ENCOUNTER — Other Ambulatory Visit: Payer: Self-pay | Admitting: Family Medicine

## 2012-04-09 ENCOUNTER — Encounter: Payer: Self-pay | Admitting: Internal Medicine

## 2012-04-09 MED ORDER — POLYSACCHARIDE IRON COMPLEX 150 MG PO CAPS: 150.0000 mg | ORAL_CAPSULE | Freq: Two times a day (BID) | ORAL | Status: DC

## 2012-04-09 NOTE — Telephone Encounter (Signed)
rx sent to pharmacy by e-script  

## 2012-04-09 NOTE — Telephone Encounter (Signed)
refill Poly Iron (Cap) 150 MG Take 1 capsule (150 mg total) by mouth 2 (two) times daily. #60 last fill 9.1413, last ov 6.24.13 acute

## 2012-05-24 ENCOUNTER — Other Ambulatory Visit: Payer: Self-pay | Admitting: Family Medicine

## 2012-05-24 NOTE — Telephone Encounter (Signed)
refill VIT D2 1.25 MG (50,000 UNIT) #40 wt/5-refills Take one capsule by mouth every 7-days--last fill 10.31.13 last ov 6.24.13

## 2012-05-24 NOTE — Telephone Encounter (Signed)
LMOVM on pt cell to verify pharmacy showing last time we filled Rx [facility] [address] and showing [facility] for recent meds filled. Awaiting pt call.  MW

## 2012-05-25 NOTE — Telephone Encounter (Signed)
Pt returned your call. Call back at [phone] ext [phone]

## 2012-05-25 NOTE — Telephone Encounter (Signed)
Pt returned your call. She would like this rx called into [facility]

## 2012-05-25 NOTE — Telephone Encounter (Signed)
Last OV 12-22-11, last filled 12-09-11 #4 5

## 2012-05-25 NOTE — Telephone Encounter (Signed)
Left message to call office

## 2012-05-25 NOTE — Telephone Encounter (Signed)
No refill on this med- this is not a maintenance med.  This is used to boost Vit D levels and we will need to recheck at upcoming CPE

## 2012-05-26 NOTE — Telephone Encounter (Signed)
Pt scheduled 08/18/2012

## 2012-05-26 NOTE — Telephone Encounter (Signed)
Pt has scheduled cpe 08/18/2012

## 2012-05-26 NOTE — Telephone Encounter (Signed)
Spoke to pt to advise per [place] denied vit d med ( SEE PREVIOUS) Pt stated understanding. Refer pt to [facility] to schedule CPE and Vit d check.    MW

## 2012-05-31 ENCOUNTER — Encounter: Payer: Self-pay | Admitting: Internal Medicine

## 2012-06-24 ENCOUNTER — Telehealth: Payer: Self-pay | Admitting: Family Medicine

## 2012-06-24 MED ORDER — HYDROCHLOROTHIAZIDE 25 MG PO TABS: 25.0000 mg | ORAL_TABLET | Freq: Every day | ORAL | Status: DC

## 2012-06-24 NOTE — Telephone Encounter (Signed)
Refill: Hydrochlorothiazide 25 mg tab. Take 1 tablet by mouth daily. Qty 30. Last fill 05-28-12

## 2012-06-24 NOTE — Telephone Encounter (Signed)
Rx sent to the pharmacy by e-script.  Pt is due for an OV.//AB/CMA

## 2012-07-07 ENCOUNTER — Ambulatory Visit (AMBULATORY_SURGERY_CENTER): Payer: Self-pay

## 2012-07-07 VITALS — Ht 64.0 in | Wt 161.6 lb

## 2012-07-07 DIAGNOSIS — Z1211 Encounter for screening for malignant neoplasm of colon: Secondary | ICD-10-CM

## 2012-07-07 MED ORDER — MOVIPREP 100 G PO SOLR: 1.0000 | Freq: Once | ORAL | Status: DC

## 2012-07-20 ENCOUNTER — Telehealth: Payer: Self-pay | Admitting: Family Medicine

## 2012-07-20 NOTE — Telephone Encounter (Signed)
refill Naproxen (Tab) 500 MG Take 1 tablet (500 mg total) by mouth 2 (two) times daily with a meal. #60 wt/1-refill last fill 11.23.13

## 2012-07-20 NOTE — Telephone Encounter (Signed)
Last seen 12/22/11 and filled 03/26/12 #60 with 1 refill.                 KP

## 2012-07-20 NOTE — Telephone Encounter (Signed)
Ok for #60, 1 refill 

## 2012-07-21 ENCOUNTER — Encounter: Payer: Self-pay | Admitting: Internal Medicine

## 2012-07-21 ENCOUNTER — Other Ambulatory Visit: Payer: Self-pay | Admitting: *Deleted

## 2012-07-21 ENCOUNTER — Ambulatory Visit (AMBULATORY_SURGERY_CENTER): Payer: 59 | Admitting: Internal Medicine

## 2012-07-21 VITALS — BP 115/72 | HR 67 | Temp 97.5°F | Resp 21 | Ht 64.0 in | Wt 161.0 lb

## 2012-07-21 DIAGNOSIS — Z1211 Encounter for screening for malignant neoplasm of colon: Secondary | ICD-10-CM

## 2012-07-21 DIAGNOSIS — R52 Pain, unspecified: Secondary | ICD-10-CM

## 2012-07-21 DIAGNOSIS — K5010 Crohn's disease of large intestine without complications: Secondary | ICD-10-CM

## 2012-07-21 MED ORDER — SODIUM CHLORIDE 0.9 % IV SOLN: 500.0000 mL | INTRAVENOUS | Status: DC

## 2012-07-21 MED ORDER — NAPROXEN 500 MG PO TABS: 500.0000 mg | ORAL_TABLET | Freq: Two times a day (BID) | ORAL | Status: DC

## 2012-07-21 NOTE — Telephone Encounter (Signed)
Refill for [facility]yn [facility]

## 2012-07-21 NOTE — Op Note (Signed)
[facility] [address] [place], [address]   COLONOSCOPY PROCEDURE REPORT  PATIENT: Sabrina, [name]  MR#: [id number] BIRTHDATE: March 14, 1956 , 56  yrs. old GENDER: Female ENDOSCOPIST: Roxy Cedar, MD REFERRED XB:[id number] Program Recall PROCEDURE DATE:  07/21/2012 PROCEDURE:   Colonoscopy, surveillance ASA CLASS:   Class II INDICATIONS:High risk patient with previously diagnosed Crohn's disease: large intestine. Mild left sided 03-2007 MEDICATIONS: MAC sedation, administered by CRNA and propofol (Diprivan) 400mg  IV  DESCRIPTION OF PROCEDURE:   After the risks benefits and alternatives of the procedure were thoroughly explained, informed consent was obtained.  A digital rectal exam revealed no abnormalities of the rectum.   The LB CF-[id number]  endoscope was introduced through the anus and advanced to the cecum, which was identified by both the appendix and ileocecal valve. No adverse events experienced.   The quality of the prep was adequate, using MoviPrep  The instrument was then slowly withdrawn as the colon was fully examined.      COLON FINDINGS: The mucosa appeared normal in the terminal ileum. A normal appearing cecum, ileocecal valve, and appendiceal orifice were identified.  The ascending, hepatic flexure, transverse, splenic flexure, descending, sigmoid colon and rectum appeared unremarkable.  No polyps or cancers were seen.  Retroflexed views revealed no abnormalities. The time to cecum=4 minutes 52 seconds. Withdrawal time=10 minutes 37 seconds.  The scope was withdrawn and the procedure completed. COMPLICATIONS: There were no complications.  ENDOSCOPIC IMPRESSION: 1.   Normal mucosa in the terminal ileum 2.   Normal colonic mucosa. No active Crohn's  RECOMMENDATIONS: 1.  Follow up colonoscopy in 5 years   eSigned:  Roxy Cedar, MD 07/21/2012 1:06 PM   cc: Sheliah Hatch, MD and The Patient

## 2012-07-21 NOTE — Progress Notes (Signed)
Patient did not experience any of the following events: a burn prior to discharge; a fall within the facility; wrong site/side/patient/procedure/implant event; or a hospital transfer or hospital admission upon discharge from the facility. (G8907) Patient did not have preoperative order for IV antibiotic SSI prophylaxis. (G8918)  

## 2012-07-21 NOTE — Patient Instructions (Addendum)

## 2012-07-21 NOTE — Progress Notes (Signed)
VSS, A&O x3, Pleased with MAC, Report to April RN. DRM

## 2012-07-22 ENCOUNTER — Telehealth: Payer: Self-pay | Admitting: *Deleted

## 2012-07-22 NOTE — Telephone Encounter (Signed)
  Follow up Call-  Call back number 07/21/2012  Post procedure Call Back phone  # [phone] ext [phone]  Permission to leave phone message Yes     Patient questions:  Receptionist stated that there was no ext [phone], and that Sabrina [name] did not work there.

## 2012-08-04 ENCOUNTER — Telehealth: Payer: Self-pay | Admitting: *Deleted

## 2012-08-04 NOTE — Telephone Encounter (Signed)
Ok for CBC, BMP, LFTs, TSH, lipids, Vit D, A1C- enter as future orders and pt can schedule a lab visit

## 2012-08-04 NOTE — Telephone Encounter (Signed)
Pt requested lab order to be faxed to her at her job.Faxed lab orders to pt.//AB/CMA

## 2012-08-04 NOTE — Telephone Encounter (Signed)
Pt called requesting order for bloodwork for CPE (which is on 08-18-12),pt stated she has her blood drawn at her job.  She would also like her HgbA1C checked b/c she's borderline diabetic.  She would like the order faxed to her job(fax#[phone]).//AB/CMA

## 2012-08-08 ENCOUNTER — Other Ambulatory Visit: Payer: Self-pay | Admitting: Family Medicine

## 2012-08-09 ENCOUNTER — Telehealth: Payer: Self-pay | Admitting: *Deleted

## 2012-08-09 NOTE — Telephone Encounter (Signed)
Patient called office reqeusting lab orders be faxed to her work again. Re-faxed orders. SGJ, RN

## 2012-08-09 NOTE — Telephone Encounter (Signed)
Rx sent to the pharmacy by e-script.//AB/CMA 

## 2012-08-17 ENCOUNTER — Encounter: Payer: Self-pay | Admitting: Lab

## 2012-08-17 ENCOUNTER — Telehealth: Payer: Self-pay | Admitting: Internal Medicine

## 2012-08-18 ENCOUNTER — Encounter: Payer: Self-pay | Admitting: Family Medicine

## 2012-08-18 ENCOUNTER — Ambulatory Visit (INDEPENDENT_AMBULATORY_CARE_PROVIDER_SITE_OTHER): Payer: 59 | Admitting: Family Medicine

## 2012-08-18 VITALS — BP 100/70 | HR 80 | Temp 97.8°F | Ht 63.75 in | Wt 158.0 lb

## 2012-08-18 DIAGNOSIS — N390 Urinary tract infection, site not specified: Secondary | ICD-10-CM

## 2012-08-18 DIAGNOSIS — Z0000 Encounter for general adult medical examination without abnormal findings: Secondary | ICD-10-CM

## 2012-08-18 DIAGNOSIS — E559 Vitamin D deficiency, unspecified: Secondary | ICD-10-CM | POA: Insufficient documentation

## 2012-08-18 LAB — POCT URINALYSIS DIPSTICK
Glucose, UA: NEGATIVE
Spec Grav, UA: 1.01
Urobilinogen, UA: 0.2

## 2012-08-18 MED ORDER — MESALAMINE 1.2 G PO TBEC: 1200.0000 mg | DELAYED_RELEASE_TABLET | Freq: Two times a day (BID) | ORAL | Status: DC

## 2012-08-18 NOTE — Telephone Encounter (Signed)
Refilled Lialda 

## 2012-08-18 NOTE — Patient Instructions (Addendum)
Follow up in 4 months to recheck diabetes Start the Metformin twice daily- take w/ food Start the Vit D 50,000 units weekly x12 weeks Continue to make healthy food choices and get regular exercise Start the Keflex twice daily x7 days Drink plenty of fluids Call with any questions or concerns Happy New Year!!!

## 2012-08-18 NOTE — Progress Notes (Signed)
  Subjective:    Patient ID: Sabrina [name], female    DOB: Nov 26, 1955, 57 y.o.   MRN: [id number]  HPI CPE- UTD on colonoscopy, overdue on mammo.  Ordered last year but not done, last done 8 yrs ago.  UTI- pt reports foul smelling urine, cloudy appearance, increased urgency and incomplete bladder emptying.  sxs started 1 month ago.  Went to [employer]) and urine was fine.  DM- UTD on eye exam (Dec 2013)  A1C has increased according to recent labs.  Has not been on meds previously.  Denies symptomatic lows  Vit D deficiency- labs show pt's Vit D is low, needs to start replacement tx.   Review of Systems Patient reports no vision/ hearing changes, adenopathy,fever, weight change,  persistant/recurrent hoarseness , swallowing issues, chest pain, palpitations, edema, persistant/recurrent cough, hemoptysis, dyspnea (rest/exertional/paroxysmal nocturnal), gastrointestinal bleeding (melena, rectal bleeding), abdominal pain, significant heartburn, bowel changes, GU symptoms (dysuria, hematuria, incontinence), Gyn symptoms (abnormal  bleeding, pain),  syncope, focal weakness, memory loss, numbness & tingling, skin/hair/nail changes, abnormal bruising or bleeding, anxiety, or depression.     Objective:   Physical Exam General Appearance:    Alert, cooperative, no distress, appears stated age  Head:    Normocephalic, without obvious abnormality, atraumatic  Eyes:    PERRL, conjunctiva/corneas clear, EOM's intact, fundi    benign, both eyes  Ears:    Normal TM's and external ear canals, both ears  Nose:   Nares normal, septum midline, mucosa normal, no drainage    or sinus tenderness  Throat:   Lips, mucosa, and tongue normal; teeth and gums normal  Neck:   Supple, symmetrical, trachea midline, no adenopathy;    Thyroid: no enlargement/tenderness/nodules  Back:     Symmetric, no curvature, ROM normal, no CVA tenderness  Lungs:     Clear to auscultation bilaterally, respirations unlabored   Chest Wall:    No tenderness or deformity   Heart:    Regular rate and rhythm, S1 and S2 normal, no murmur, rub   or gallop  Breast Exam:    Deferred to mammo  Abdomen:     Soft, non-tender, bowel sounds active all four quadrants,    no masses, no organomegaly  Genitalia:    Deferred  Rectal:    Extremities:   Extremities normal, atraumatic, no cyanosis or edema  Pulses:   2+ and symmetric all extremities  Skin:   Skin color, texture, turgor normal, no rashes or lesions  Lymph nodes:   Cervical, supraclavicular, and axillary nodes normal  Neurologic:   CNII-XII intact, normal strength, sensation and reflexes    throughout          Assessment & Plan:

## 2012-08-19 MED ORDER — CEPHALEXIN 500 MG PO CAPS: 500.0000 mg | ORAL_CAPSULE | Freq: Two times a day (BID) | ORAL | Status: AC

## 2012-08-19 MED ORDER — VITAMIN D (ERGOCALCIFEROL) 1.25 MG (50000 UNIT) PO CAPS: 50000.0000 [IU] | ORAL_CAPSULE | ORAL | Status: DC

## 2012-08-19 MED ORDER — METFORMIN HCL 500 MG PO TABS: 500.0000 mg | ORAL_TABLET | Freq: Two times a day (BID) | ORAL | Status: DC

## 2012-08-22 LAB — URINE CULTURE: Colony Count: >=100000

## 2012-08-28 ENCOUNTER — Other Ambulatory Visit: Payer: Self-pay | Admitting: Family Medicine

## 2012-08-29 NOTE — Assessment & Plan Note (Signed)
Pt's PE WNL.  Pt UTD on colonoscopy, overdue on mammo.  Referral made.  Reviewed recent labs.  Anticipatory guidance provided.

## 2012-08-29 NOTE — Assessment & Plan Note (Signed)
Chronic problem, has not required meds but A1C has increased to >7.  Start Metformin.  Reviewed importance of low carb diet and regular exercise.  Pt expressed understanding and is in agreement w/ plan.

## 2012-08-29 NOTE — Assessment & Plan Note (Signed)
New.  Pt's sxs and UA consistent w/ infxn.  Start abx.

## 2012-09-07 ENCOUNTER — Ambulatory Visit (INDEPENDENT_AMBULATORY_CARE_PROVIDER_SITE_OTHER): Payer: 59 | Admitting: Family Medicine

## 2012-09-07 ENCOUNTER — Encounter: Payer: Self-pay | Admitting: Family Medicine

## 2012-09-07 VITALS — BP 106/70 | HR 86 | Temp 98.1°F | Ht 63.75 in | Wt 162.2 lb

## 2012-09-07 DIAGNOSIS — N390 Urinary tract infection, site not specified: Secondary | ICD-10-CM

## 2012-09-07 LAB — POCT URINALYSIS DIPSTICK
Glucose, UA: NEGATIVE
Nitrite, UA: NEGATIVE
Spec Grav, UA: 1.02
Urobilinogen, UA: 0.2

## 2012-09-07 MED ORDER — AMOXICILLIN 500 MG PO CAPS: 500.0000 mg | ORAL_CAPSULE | Freq: Two times a day (BID) | ORAL | Status: DC

## 2012-09-07 NOTE — Progress Notes (Signed)
  Subjective:    Patient ID: Sabrina [name], female    DOB: 06-09-1956, 57 y.o.   MRN: [id number]  HPI UTI- pt had strep bovis UTI in Feb.  sxs improved on Keflex but have returned.  Friday developed frequency, urgency, burning w/ urination.  No fevers.  No CVA tenderness.   Review of Systems For ROS see HPI     Objective:   Physical Exam  Vitals reviewed. Constitutional: She appears well-developed and well-nourished. No distress.  Abdominal: Soft. She exhibits no distension. There is tenderness (+ suprapubic but no CVA tenderness ).          Assessment & Plan:

## 2012-09-07 NOTE — Assessment & Plan Note (Signed)
Pt again w/ UTI sxs and abnormal UA.  Start Amox based on recent strep bovis UTI.  Await urine cx and sensitivities.  Will follow closely.

## 2012-09-07 NOTE — Patient Instructions (Addendum)
Start the Amoxicillin twice daily We'll notify you of your culture results Drink plenty of fluids Call with any questions or concerns Hang in there!!!

## 2012-09-09 ENCOUNTER — Encounter: Payer: Self-pay | Admitting: Family Medicine

## 2012-09-09 LAB — URINE CULTURE: Colony Count: >=100000

## 2012-09-20 ENCOUNTER — Emergency Department (HOSPITAL_COMMUNITY): Payer: 59

## 2012-09-20 ENCOUNTER — Emergency Department (HOSPITAL_COMMUNITY)
Admission: EM | Admit: 2012-09-20 | Discharge: 2012-09-20 | Disposition: A | Discharge status: Home / Self Care | Payer: 59 | Attending: Emergency Medicine | Admitting: Emergency Medicine

## 2012-09-20 ENCOUNTER — Encounter (HOSPITAL_COMMUNITY): Payer: Self-pay

## 2012-09-20 ENCOUNTER — Telehealth: Payer: Self-pay | Admitting: Family Medicine

## 2012-09-20 DIAGNOSIS — Z79899 Other long term (current) drug therapy: Secondary | ICD-10-CM | POA: Insufficient documentation

## 2012-09-20 DIAGNOSIS — Z8739 Personal history of other diseases of the musculoskeletal system and connective tissue: Secondary | ICD-10-CM | POA: Insufficient documentation

## 2012-09-20 DIAGNOSIS — R079 Chest pain, unspecified: Secondary | ICD-10-CM

## 2012-09-20 DIAGNOSIS — IMO0002 Reserved for concepts with insufficient information to code with codable children: Secondary | ICD-10-CM | POA: Insufficient documentation

## 2012-09-20 DIAGNOSIS — R9389 Abnormal findings on diagnostic imaging of other specified body structures: Secondary | ICD-10-CM | POA: Insufficient documentation

## 2012-09-20 DIAGNOSIS — R0789 Other chest pain: Secondary | ICD-10-CM | POA: Insufficient documentation

## 2012-09-20 DIAGNOSIS — F319 Bipolar disorder, unspecified: Secondary | ICD-10-CM | POA: Insufficient documentation

## 2012-09-20 DIAGNOSIS — E785 Hyperlipidemia, unspecified: Secondary | ICD-10-CM | POA: Insufficient documentation

## 2012-09-20 DIAGNOSIS — E119 Type 2 diabetes mellitus without complications: Secondary | ICD-10-CM | POA: Insufficient documentation

## 2012-09-20 DIAGNOSIS — Z8719 Personal history of other diseases of the digestive system: Secondary | ICD-10-CM | POA: Insufficient documentation

## 2012-09-20 DIAGNOSIS — Z8709 Personal history of other diseases of the respiratory system: Secondary | ICD-10-CM | POA: Insufficient documentation

## 2012-09-20 DIAGNOSIS — I10 Essential (primary) hypertension: Secondary | ICD-10-CM | POA: Insufficient documentation

## 2012-09-20 DIAGNOSIS — Z8742 Personal history of other diseases of the female genital tract: Secondary | ICD-10-CM | POA: Insufficient documentation

## 2012-09-20 LAB — CBC WITH DIFFERENTIAL/PLATELET
Basophils Relative: 1 % (ref 0–1)
Eosinophils Relative: 5 % (ref 0–5)
HCT: 26 % — ABNORMAL LOW (ref 36.0–46.0)
Hemoglobin: 8 g/dL — ABNORMAL LOW (ref 12.0–15.0)
Lymphocytes Relative: 35 % (ref 12–46)
Lymphs Abs: 0.8 10*3/uL (ref 0.7–4.0)
MCV: 74.1 fL — ABNORMAL LOW (ref 78.0–100.0)
Monocytes Relative: 5 % (ref 3–12)
Neutro Abs: 1.3 10*3/uL — ABNORMAL LOW (ref 1.7–7.7)
RBC: 3.51 MIL/uL — ABNORMAL LOW (ref 3.87–5.11)
WBC: 2.3 10*3/uL — ABNORMAL LOW (ref 4.0–10.5)

## 2012-09-20 LAB — D-DIMER, QUANTITATIVE: D-Dimer, Quant: 0.99 ug/mL-FEU — ABNORMAL HIGH (ref 0.00–0.48)

## 2012-09-20 LAB — COMPREHENSIVE METABOLIC PANEL
ALT: 30 U/L (ref 0–35)
AST: 52 U/L — ABNORMAL HIGH (ref 0–37)
Albumin: 2.8 g/dL — ABNORMAL LOW (ref 3.5–5.2)
CO2: 20 mEq/L (ref 19–32)
Calcium: 9 mg/dL (ref 8.4–10.5)
GFR calc non Af Amer: >90 mL/min (ref >90)
Sodium: 133 mEq/L — ABNORMAL LOW (ref 135–145)

## 2012-09-20 LAB — POCT I-STAT TROPONIN I

## 2012-09-20 MED ORDER — METHOCARBAMOL 500 MG PO TABS: 500.0000 mg | ORAL_TABLET | Freq: Two times a day (BID) | ORAL | Status: DC

## 2012-09-20 MED ORDER — POTASSIUM CHLORIDE CRYS ER 20 MEQ PO TBCR
40.0000 meq | EXTENDED_RELEASE_TABLET | Freq: Once | ORAL | Status: AC
  Administered 2012-09-20: 40 meq via ORAL
  Filled 2012-09-20: qty 2

## 2012-09-20 MED ORDER — OXYCODONE-ACETAMINOPHEN 5-325 MG PO TABS: 2.0000 | ORAL_TABLET | ORAL | Status: DC | PRN

## 2012-09-20 MED ORDER — SODIUM CHLORIDE 0.9 % IV SOLN
INTRAVENOUS | Status: DC
  Administered 2012-09-20: 20 mL/h via INTRAVENOUS

## 2012-09-20 MED ORDER — IOHEXOL 350 MG/ML SOLN
100.0000 mL | Freq: Once | INTRAVENOUS | Status: AC | PRN
  Administered 2012-09-20: 71 mL via INTRAVENOUS

## 2012-09-20 NOTE — Telephone Encounter (Signed)
Patient Information:  Caller Name: Danika  Phone: [phone]  [name], Sabrina [name]  Gender: Female  DOB: 1956/04/20  Age: 57 Years  PCP: Sheliah Hatch.  Office Follow Up:  Does the office need to follow up with this patient?: No  Instructions For The Office: N/A  RN Note:  Pain is worse when taking a deep breath. Pain is stabbing. Caller states she does not think it is her heart. Denies numbness. Caller will hang up and call 911.  Symptoms  Reason For Call & Symptoms: diarrhea x1 week, pain on right side of chest and radiates to upper abdomen. Pain is constant and then sharp and takes breath away. Onset 1030 AM  Reviewed Health History In EMR: Yes  Reviewed Medications In EMR: Yes  Reviewed Allergies In EMR: N/A  Reviewed Surgeries / Procedures: N/A  Date of Onset of Symptoms: 09/20/2012  Guideline(s) Used:  Chest Pain  Disposition Per Guideline:   Call EMS 911 Now  Reason For Disposition Reached:   Chest pain lasting longer than 5 minutes and ANY of the following:  Over 45 years old Over 28 years old and at least one cardiac risk factor (i.e., high blood pressure, diabetes, high cholesterol, obesity, smoker or strong family history of heart disease) Pain is crushing, pressure-like, or heavy  Took nitroglycerin and chest pain was not relieved History of heart disease (i.e., angina, heart attack, bypass surgery, angioplasty, CHF)  Advice Given:  Call Back If:  You become worse.  Patient Will Follow Care Advice:  YES

## 2012-09-20 NOTE — ED Notes (Signed)
Patient reports that she began having right chest pain at 1100 today. Patient denies having diaphoresis, SOB, or radiating of pain. Patient describes the pain as stabbing and constant.

## 2012-09-20 NOTE — Telephone Encounter (Signed)
Pain is more consistent w/ gallbladder pain- agree w/ ER evaluation due to severe pain but doesn't need to call 911 (if she hasn't already done so)

## 2012-09-20 NOTE — Telephone Encounter (Signed)
Patient called office and advised that her husband would not allow her to go to the ER, stating they could not afford it. Patient states pain is constant and worse with deep breaths. I asked if they could afford UC eval..she stated they could do that. Patient stated that she would go to the UC for attention.

## 2012-09-20 NOTE — ED Provider Notes (Addendum)
History     CSN: [id number]  Arrival date & time 09/20/12  1544   First MD Initiated Contact with Patient 09/20/12 1619      Chief Complaint  Patient presents with  . Chest Pain    (Consider location/radiation/quality/duration/timing/severity/associated sxs/prior treatment) Patient is a 57 y.o. female presenting with chest pain. The history is provided by the patient.  Chest Pain  patient here complaining of right upper quadrant sharp pain that radiates to her chest since 11:00 today. Denies any associated diaphoresis or dyspnea. No recent cough or congestion. No association with food. She's concerned this might be her gallbladder. Denies any black or bloody stools. She's had nausea no vomiting. No prior history of same. Denies any anginal type chest pain. No treatment used prior to arrival  Past Medical History  Diagnosis Date  . Arthritis   . Allergic rhinitis   . Colitis   . Degenerative disc disease   . Hyperlipidemia   . Hypertension   . Celiac disease   . Bipolar disorder   . Macular degeneration   . Interstitial cystitis   . Diabetes mellitus without complication     Past Surgical History  Procedure Laterality Date  . Tubal ligation    . Appendectomy    . Lumbar laminectomy    . Carpal tunnel release      bilateral  . Cesarean section    . Cysto/ hod/ instillatio clorpactin  07-03-2008  &  06-09-2001    INTERSTITIAL CYSTITIS  . Shoulder arthroscopy w/ rotator cuff repair  01-25-2008    LEFT SHOULDER /   INCLUDING CAPSULECTOMY WITH DEBRIDEMENT/  SAD WITH RESECTION OF AC  . Right shoulder arthroscopy/ open resection distal clavicle/ debridement adhesive capsulitis/ rotator cuff repair  08-30-2003  . Tonsillectomy and adenoidectomy    . Transthoracic echocardiogram  03-28-2011    NORMAL LVSF/ EF 60-65%/ VERY MILD AORTIC STENOSIS, TRIVIAL REGURG./ MILDLY DILATED LEFT ATRIUM  . Cystoscopy  11/26/2011    Procedure: CYSTOSCOPY;  Surgeon: Valetta Fuller, MD;   Location: [facility];  Service: Urology;  Laterality: N/A;  clorpactin  in bladder    Family History  Problem Relation Age of Onset  . Diabetes Mother   . Hypertension Mother   . Diabetes Brother   . Hypertension Brother   . Hyperlipidemia Brother   . Hypertension    . Colon cancer Neg Hx     History  Substance Use Topics  . Smoking status: Never Smoker   . Smokeless tobacco: Never Used  . Alcohol Use: No    OB History   Grav Para Term Preterm Abortions TAB SAB Ect Mult Living                  Review of Systems  Cardiovascular: Positive for chest pain.  All other systems reviewed and are negative.    Allergies  Sulfonamide derivatives and Wheat bran  Home Medications   Current Outpatient Rx  Name  Route  Sig  Dispense  Refill  . ARIPiprazole (ABILIFY) 10 MG tablet   Oral   Take 10 mg by mouth daily.          . fexofenadine (ALLEGRA) 180 MG tablet   Oral   Take 180 mg by mouth daily.           Marland Kitchen FLUoxetine (PROZAC) 40 MG capsule   Oral   Take 40 mg by mouth daily.           Marland Kitchen  fluticasone (FLONASE) 50 MCG/ACT nasal spray   Nasal   Place 2 sprays into the nose daily.   16 g   2   . hydrochlorothiazide (HYDRODIURIL) 25 MG tablet   Oral   Take 25 mg by mouth daily.         Marland Kitchen lamoTRIgine (LAMICTAL) 200 MG tablet   Oral   Take 200 mg by mouth at bedtime.           Marland Kitchen LORazepam (ATIVAN) 0.5 MG tablet   Oral   Take 0.5 mg by mouth 2 (two) times daily.          . mesalamine (LIALDA) 1.2 G EC tablet   Oral   Take 1 tablet (1.2 g total) by mouth 2 (two) times daily.   180 tablet   1   . metFORMIN (GLUCOPHAGE) 500 MG tablet   Oral   Take 1 tablet (500 mg total) by mouth 2 (two) times daily with a meal.   180 tablet   3   . Meth-Hyo-M Bl-Na Phos-Ph Sal (URIBEL) 118 MG CAPS   Oral   Take 1 capsule (118 mg total) by mouth 3 (three) times daily as needed.   15 capsule   1   . Misc Natural Products (OSTEO BI-FLEX ADV  DOUBLE ST PO)   Oral   Take by mouth daily.           . Multiple Vitamin (MULTIVITAMIN) tablet   Oral   Take 1 tablet by mouth daily.           . naproxen (NAPROSYN) 500 MG tablet   Oral   Take 1 tablet (500 mg total) by mouth 2 (two) times daily with a meal.   180 tablet   1   . pentosan polysulfate (ELMIRON) 100 MG capsule   Oral   Take 100 mg by mouth 2 (two) times daily. Take 2 tablets in the morning and 2 tablets in the evening.         . traZODone (DESYREL) 50 MG tablet   Oral   Take 25 mg by mouth at bedtime.           . triamcinolone (KENALOG) 0.1 % paste      Apply to mouths 3x/day after meals and prior to bed   5 g   6   . Vitamin D, Ergocalciferol, (DRISDOL) 50000 UNITS CAPS   Oral   Take 1 capsule (50,000 Units total) by mouth every 7 (seven) days.   12 capsule   0     BP 126/62  Pulse 77  Temp(Src) 97.8 F (36.6 C) (Oral)  Resp 16  SpO2 100%  Physical Exam  Nursing note and vitals reviewed. Constitutional: She is oriented to person, place, and time. She appears well-developed and well-nourished.  Non-toxic appearance. No distress.  HENT:  Head: Normocephalic and atraumatic.  Eyes: Conjunctivae, EOM and lids are normal. Pupils are equal, round, and reactive to light.  Neck: Normal range of motion. Neck supple. No tracheal deviation present. No mass present.  Cardiovascular: Normal rate, regular rhythm and normal heart sounds.  Exam reveals no gallop.   No murmur heard. Pulmonary/Chest: Effort normal and breath sounds normal. No stridor. No respiratory distress. She has no decreased breath sounds. She has no wheezes. She has no rhonchi. She has no rales.  Abdominal: Soft. Normal appearance and bowel sounds are normal. She exhibits no distension. There is tenderness in the right upper quadrant. There is no rigidity,  no rebound, no guarding and no CVA tenderness.  Musculoskeletal: Normal range of motion. She exhibits no edema and no tenderness.   Neurological: She is alert and oriented to person, place, and time. She has normal strength. No cranial nerve deficit or sensory deficit. GCS eye subscore is 4. GCS verbal subscore is 5. GCS motor subscore is 6.  Skin: Skin is warm and dry. No abrasion and no rash noted.  Psychiatric: She has a normal mood and affect. Her speech is normal and behavior is normal.    ED Course  Procedures (including critical care time)  Labs Reviewed  CBC WITH DIFFERENTIAL  COMPREHENSIVE METABOLIC PANEL  LIPASE, BLOOD  D-DIMER, QUANTITATIVE   No results found.   No diagnosis found.    MDM   Date: 09/20/2012  Rate: 76  Rhythm: normal sinus rhythm  QRS Axis: normal  Intervals: normal  ST/T Wave abnormalities: nonspecific T wave changes  Conduction Disutrbances:none  Narrative Interpretation:   Old EKG Reviewed: none available    9:13 PM Patient's labs and x-rays were reviewed.  Potassium was low at 3.3 and she was given potassium Is for this. Patient's hemoglobin was noted at 8 and this is decreased from her baseline. Patient states that she has a long history of celiac disease and that she normally will get blood transfusion when her hemoglobin goes down to 7. Denies any weakness or any other symptoms. States that she is on diet therapy for this and that her doctor is following this closely. I had a long discussion with patient about her sclerotic lesions found in her thoracic spine and about the possibility that this could be cancer and that this will require close followup with her physician. She understands this. No concern for ACS. Pulmonary he also has a ruled out. As well as aortic dissection. Patient to be placed on medications and discharged home      Toy Baker, MD 09/20/12 2114  Toy Baker, MD 09/20/12 2120

## 2012-09-22 ENCOUNTER — Ambulatory Visit (INDEPENDENT_AMBULATORY_CARE_PROVIDER_SITE_OTHER): Payer: 59 | Admitting: Family Medicine

## 2012-09-22 ENCOUNTER — Encounter: Payer: Self-pay | Admitting: Family Medicine

## 2012-09-22 VITALS — BP 108/78 | HR 86 | Temp 97.9°F | Ht 63.75 in | Wt 155.8 lb

## 2012-09-22 DIAGNOSIS — R937 Abnormal findings on diagnostic imaging of other parts of musculoskeletal system: Secondary | ICD-10-CM

## 2012-09-22 NOTE — Progress Notes (Signed)
  Subjective:    Patient ID: Sabrina [name], female    DOB: 1955/10/05, 57 y.o.   MRN: [id number]  HPI ER f/u- pt went to ER for CP on 3/24.  Had CT scan that showed possible sclerotic lesions on thoracic vertebrae (T1, 2, 3).  Recommended PCP f/u.  UTD on colonoscopy, overdue on mammo.  Pt has hx of low WBC- has seen heme previously.  Never had bone marrow bx.  Had normal CXR.  Pt denies weight loss, night sweats, fevers.  Naturally very anxious and upset about report- 'they said i have cancer'.   Review of Systems For ROS see HPI     Objective:   Physical Exam  Vitals reviewed. Constitutional: She is oriented to person, place, and time. She appears well-developed and well-nourished. She appears distressed (obviously anxious, shaking).  Neurological: She is alert and oriented to person, place, and time.  Skin: Skin is warm and dry.  Psychiatric:  Anxious, fighting tears, shaking          Assessment & Plan:

## 2012-09-22 NOTE — Assessment & Plan Note (Addendum)
New.  Reviewed pt's recent CT report and spoke to radiologist about next steps in workup.  Radiology reviewed scan and found that T1 and T3 lesions were present on Cspine films from 2005 and are unchanged.  Radiologist states 'these are likely benign and incidental findings'.  Pt greatly relieved.  Strongly encouraged Mammo.  Will get MRI to assess thoracic spine and better clarify dx.  Reviewed supportive care and red flags that should prompt return.  Pt expressed understanding and is in agreement w/ plan.  Total time spent w/ pt- 28 minutes, >50% spent counseling

## 2012-09-22 NOTE — Patient Instructions (Addendum)
We'll call you with your MRI appt and notify you of your lab results So far this is good news!! Hang in there!!

## 2012-09-23 ENCOUNTER — Ambulatory Visit
Admission: RE | Admit: 2012-09-23 | Discharge: 2012-09-23 | Disposition: A | Discharge status: Home / Self Care | Payer: 59 | Source: Ambulatory Visit | Attending: Family Medicine | Admitting: Family Medicine

## 2012-09-23 DIAGNOSIS — R937 Abnormal findings on diagnostic imaging of other parts of musculoskeletal system: Secondary | ICD-10-CM

## 2012-09-23 MED ORDER — GADOBENATE DIMEGLUMINE 529 MG/ML IV SOLN
14.0000 mL | Freq: Once | INTRAVENOUS | Status: AC | PRN
  Administered 2012-09-23: 14 mL via INTRAVENOUS

## 2012-10-27 ENCOUNTER — Telehealth: Payer: Self-pay | Admitting: General Practice

## 2012-10-27 ENCOUNTER — Other Ambulatory Visit: Payer: Self-pay | Admitting: Family Medicine

## 2012-10-27 MED ORDER — HYDROCHLOROTHIAZIDE 25 MG PO TABS: 25.0000 mg | ORAL_TABLET | Freq: Every day | ORAL | Status: DC

## 2012-10-27 NOTE — Telephone Encounter (Signed)
Pt called requesting her HCTZ to be filled at [facility] on [address]. Med filled.

## 2012-10-29 ENCOUNTER — Inpatient Hospital Stay (HOSPITAL_COMMUNITY)
Admission: EM | Admit: 2012-10-29 | Discharge: 2012-10-30 | DRG: 812 | Disposition: A | Discharge status: Home / Self Care | Payer: 59 | Attending: Internal Medicine | Admitting: Internal Medicine

## 2012-10-29 ENCOUNTER — Encounter (HOSPITAL_COMMUNITY): Payer: Self-pay | Admitting: *Deleted

## 2012-10-29 ENCOUNTER — Telehealth: Payer: Self-pay | Admitting: General Practice

## 2012-10-29 ENCOUNTER — Ambulatory Visit (INDEPENDENT_AMBULATORY_CARE_PROVIDER_SITE_OTHER): Payer: 59 | Admitting: Family Medicine

## 2012-10-29 ENCOUNTER — Encounter: Payer: Self-pay | Admitting: Family Medicine

## 2012-10-29 VITALS — BP 102/62 | HR 78 | Temp 98.3°F | Ht 63.75 in | Wt 156.0 lb

## 2012-10-29 DIAGNOSIS — D509 Iron deficiency anemia, unspecified: Principal | ICD-10-CM | POA: Diagnosis present

## 2012-10-29 DIAGNOSIS — K5010 Crohn's disease of large intestine without complications: Secondary | ICD-10-CM | POA: Diagnosis present

## 2012-10-29 DIAGNOSIS — E876 Hypokalemia: Secondary | ICD-10-CM | POA: Diagnosis present

## 2012-10-29 DIAGNOSIS — H3530 Unspecified macular degeneration: Secondary | ICD-10-CM | POA: Diagnosis present

## 2012-10-29 DIAGNOSIS — R609 Edema, unspecified: Secondary | ICD-10-CM | POA: Diagnosis present

## 2012-10-29 DIAGNOSIS — Z79899 Other long term (current) drug therapy: Secondary | ICD-10-CM

## 2012-10-29 DIAGNOSIS — F411 Generalized anxiety disorder: Secondary | ICD-10-CM | POA: Diagnosis present

## 2012-10-29 DIAGNOSIS — F319 Bipolar disorder, unspecified: Secondary | ICD-10-CM

## 2012-10-29 DIAGNOSIS — R5383 Other fatigue: Secondary | ICD-10-CM

## 2012-10-29 DIAGNOSIS — D696 Thrombocytopenia, unspecified: Secondary | ICD-10-CM | POA: Diagnosis present

## 2012-10-29 DIAGNOSIS — I10 Essential (primary) hypertension: Secondary | ICD-10-CM | POA: Diagnosis present

## 2012-10-29 DIAGNOSIS — R9431 Abnormal electrocardiogram [ECG] [EKG]: Secondary | ICD-10-CM | POA: Diagnosis present

## 2012-10-29 DIAGNOSIS — K900 Celiac disease: Secondary | ICD-10-CM | POA: Diagnosis present

## 2012-10-29 DIAGNOSIS — D61818 Other pancytopenia: Secondary | ICD-10-CM | POA: Diagnosis present

## 2012-10-29 DIAGNOSIS — K254 Chronic or unspecified gastric ulcer with hemorrhage: Secondary | ICD-10-CM | POA: Diagnosis present

## 2012-10-29 DIAGNOSIS — R195 Other fecal abnormalities: Secondary | ICD-10-CM | POA: Diagnosis present

## 2012-10-29 DIAGNOSIS — N3010 Interstitial cystitis (chronic) without hematuria: Secondary | ICD-10-CM | POA: Diagnosis present

## 2012-10-29 DIAGNOSIS — K5090 Crohn's disease, unspecified, without complications: Secondary | ICD-10-CM | POA: Diagnosis present

## 2012-10-29 DIAGNOSIS — I359 Nonrheumatic aortic valve disorder, unspecified: Secondary | ICD-10-CM | POA: Diagnosis present

## 2012-10-29 DIAGNOSIS — D649 Anemia, unspecified: Secondary | ICD-10-CM | POA: Diagnosis present

## 2012-10-29 DIAGNOSIS — E119 Type 2 diabetes mellitus without complications: Secondary | ICD-10-CM | POA: Diagnosis present

## 2012-10-29 DIAGNOSIS — D72819 Decreased white blood cell count, unspecified: Secondary | ICD-10-CM | POA: Diagnosis present

## 2012-10-29 DIAGNOSIS — R079 Chest pain, unspecified: Secondary | ICD-10-CM | POA: Diagnosis present

## 2012-10-29 DIAGNOSIS — E785 Hyperlipidemia, unspecified: Secondary | ICD-10-CM | POA: Diagnosis present

## 2012-10-29 HISTORY — DX: Iron deficiency anemia, unspecified: D50.9

## 2012-10-29 LAB — CBC WITH DIFFERENTIAL/PLATELET
Basophils Relative: 0.1 % (ref 0.0–3.0)
Eosinophils Relative: 2.6 % (ref 0.0–5.0)
Eosinophils Relative: 3 % (ref 0–5)
HCT: 19.4 % — CL (ref 36.0–46.0)
HCT: 20.3 % — ABNORMAL LOW (ref 36.0–46.0)
Hemoglobin: 5.9 g/dL — CL (ref 12.0–15.0)
Lymphocytes Relative: 33 % (ref 12–46)
Lymphs Abs: 0.9 10*3/uL (ref 0.7–4.0)
Lymphs Abs: 0.7 10*3/uL (ref 0.7–4.0)
MCV: 66.1 fl — ABNORMAL LOW (ref 78.0–100.0)
MCV: 68.8 fL — ABNORMAL LOW (ref 78.0–100.0)
Monocytes Absolute: 0.2 10*3/uL (ref 0.1–1.0)
Monocytes Absolute: 0.2 10*3/uL (ref 0.1–1.0)
Monocytes Relative: 9.2 % (ref 3.0–12.0)
Monocytes Relative: 9 % (ref 3–12)
Platelets: 133 10*3/uL — ABNORMAL LOW (ref 150.0–400.0)
RBC: 2.94 Mil/uL — ABNORMAL LOW (ref 3.87–5.11)
RBC: 2.95 MIL/uL — ABNORMAL LOW (ref 3.87–5.11)
RDW: 16.9 % — ABNORMAL HIGH (ref 11.5–15.5)
WBC: 2.5 10*3/uL — ABNORMAL LOW (ref 4.5–10.5)
WBC: 2 10*3/uL — ABNORMAL LOW (ref 4.0–10.5)

## 2012-10-29 LAB — BASIC METABOLIC PANEL
BUN: 7 mg/dL (ref 6–23)
CO2: 26 mEq/L (ref 19–32)
Chloride: 103 mEq/L (ref 96–112)
Creatinine, Ser: 0.46 mg/dL — ABNORMAL LOW (ref 0.50–1.10)

## 2012-10-29 LAB — RETICULOCYTES
RBC.: 2.95 MIL/uL — ABNORMAL LOW (ref 3.87–5.11)
Retic Count, Absolute: 56.1 10*3/uL (ref 19.0–186.0)

## 2012-10-29 LAB — ABO/RH: ABO/RH(D): A POS

## 2012-10-29 MED ORDER — SODIUM CHLORIDE 0.9 % IV SOLN
500.0000 mL | Freq: Once | INTRAVENOUS | Status: AC
  Administered 2012-10-30: 500 mL via INTRAVENOUS

## 2012-10-29 MED ORDER — ARIPIPRAZOLE 10 MG PO TABS
10.0000 mg | ORAL_TABLET | Freq: Every day | ORAL | Status: DC
  Administered 2012-10-30: 10 mg via ORAL
  Filled 2012-10-29: qty 1

## 2012-10-29 MED ORDER — PENTOSAN POLYSULFATE SODIUM 100 MG PO CAPS
200.0000 mg | ORAL_CAPSULE | Freq: Two times a day (BID) | ORAL | Status: DC
  Administered 2012-10-30 (×2): 200 mg via ORAL
  Filled 2012-10-29 (×3): qty 2

## 2012-10-29 MED ORDER — ONDANSETRON HCL 4 MG/2ML IJ SOLN: 4.0000 mg | Freq: Four times a day (QID) | INTRAMUSCULAR | Status: DC | PRN

## 2012-10-29 MED ORDER — ACETAMINOPHEN 650 MG RE SUPP: 650.0000 mg | Freq: Four times a day (QID) | RECTAL | Status: DC | PRN

## 2012-10-29 MED ORDER — POTASSIUM CHLORIDE CRYS ER 20 MEQ PO TBCR
40.0000 meq | EXTENDED_RELEASE_TABLET | Freq: Once | ORAL | Status: AC
  Administered 2012-10-30: 40 meq via ORAL
  Filled 2012-10-29 (×2): qty 1

## 2012-10-29 MED ORDER — LORAZEPAM 0.5 MG PO TABS
0.5000 mg | ORAL_TABLET | Freq: Two times a day (BID) | ORAL | Status: DC
  Administered 2012-10-30 (×2): 0.5 mg via ORAL
  Filled 2012-10-29 (×2): qty 1

## 2012-10-29 MED ORDER — TRAZODONE 25 MG HALF TABLET
25.0000 mg | ORAL_TABLET | Freq: Every day | ORAL | Status: DC
  Administered 2012-10-30: 25 mg via ORAL
  Filled 2012-10-29 (×2): qty 1

## 2012-10-29 MED ORDER — MESALAMINE 1.2 G PO TBEC
1200.0000 mg | DELAYED_RELEASE_TABLET | Freq: Two times a day (BID) | ORAL | Status: DC
  Administered 2012-10-30 (×2): 1.2 g via ORAL
  Filled 2012-10-29 (×3): qty 1

## 2012-10-29 MED ORDER — ACETAMINOPHEN 325 MG PO TABS
650.0000 mg | ORAL_TABLET | Freq: Once | ORAL | Status: AC
  Administered 2012-10-30: 650 mg via ORAL
  Filled 2012-10-29: qty 2

## 2012-10-29 MED ORDER — FLUOXETINE HCL 20 MG PO CAPS
40.0000 mg | ORAL_CAPSULE | Freq: Every day | ORAL | Status: DC
  Administered 2012-10-30: 40 mg via ORAL
  Filled 2012-10-29: qty 2

## 2012-10-29 MED ORDER — PANTOPRAZOLE SODIUM 40 MG IV SOLR
40.0000 mg | Freq: Every day | INTRAVENOUS | Status: DC
  Administered 2012-10-30: 40 mg via INTRAVENOUS
  Filled 2012-10-29 (×2): qty 40

## 2012-10-29 MED ORDER — SODIUM CHLORIDE 0.9 % IJ SOLN
3.0000 mL | Freq: Two times a day (BID) | INTRAMUSCULAR | Status: DC
  Administered 2012-10-30 (×2): 3 mL via INTRAVENOUS

## 2012-10-29 MED ORDER — SODIUM CHLORIDE 0.9 % IV SOLN
INTRAVENOUS | Status: AC
  Administered 2012-10-30: 75 mL/h via INTRAVENOUS

## 2012-10-29 MED ORDER — ONDANSETRON HCL 4 MG PO TABS: 4.0000 mg | ORAL_TABLET | Freq: Four times a day (QID) | ORAL | Status: DC | PRN

## 2012-10-29 MED ORDER — LAMOTRIGINE 200 MG PO TABS
200.0000 mg | ORAL_TABLET | Freq: Every day | ORAL | Status: DC
  Administered 2012-10-30: 200 mg via ORAL
  Filled 2012-10-29 (×2): qty 1

## 2012-10-29 MED ORDER — INSULIN ASPART 100 UNIT/ML ~~LOC~~ SOLN
0.0000 [IU] | SUBCUTANEOUS | Status: DC
  Administered 2012-10-30: 1 [IU] via SUBCUTANEOUS

## 2012-10-29 MED ORDER — ACETAMINOPHEN 325 MG PO TABS: 650.0000 mg | ORAL_TABLET | Freq: Four times a day (QID) | ORAL | Status: DC | PRN

## 2012-10-29 MED ORDER — HYDROCODONE-ACETAMINOPHEN 5-325 MG PO TABS: 1.0000 | ORAL_TABLET | ORAL | Status: DC | PRN

## 2012-10-29 MED ORDER — DIPHENHYDRAMINE HCL 25 MG PO CAPS
25.0000 mg | ORAL_CAPSULE | Freq: Once | ORAL | Status: AC
  Administered 2012-10-30: 25 mg via ORAL
  Filled 2012-10-29: qty 1

## 2012-10-29 NOTE — Telephone Encounter (Signed)
[facility] called in a critical Hgb-6.1 and hematocrit 19.4 on pt.

## 2012-10-29 NOTE — Telephone Encounter (Signed)
Please call pt and inform her that she is VERY anemic- most likely due to her Crohn's disease.  Based on how she's feeling (fatigue, swelling) she should go to ER for possible transfusion.  Please call [facility] and see if they do transfusions in the ER there

## 2012-10-29 NOTE — H&P (Signed)
PCP:  Neena Rhymes, MD  Dr. [facility]  Chief Complaint:  Low Hb found on routine labs HPI: Sabrina [name] is a 57 y.o. female   has a past medical history of Arthritis; Allergic rhinitis; Colitis; Degenerative disc disease; Hyperlipidemia; Hypertension; Celiac disease; Bipolar disorder; Macular degeneration; Interstitial cystitis; and Diabetes mellitus without complication.   Presented with  Patient states she have had bilateral ankle swelling for the past 2 weeks. And presented to her PC  Who ordered blood work and called patient with Hg of 6.1. Patient presented to ER. She also endorses  feeling lightheaded, fatigued for months, some shortness of breath. Endorses having dark stools but not black for about 1 month. She has hx of anemia in the past and had to have a transfusion 8 years ago prior to having surgery. She have had screening colonoscopy in January and it was normal. Reports having endoscopy 4 years ago that was unremarkable.  At one point she had a colonoscopy done in 2008 that showed questionable crohn's disease. She is still taking Mesalamine. She denies any epigastric pain, no hematemesis. She  Does take daily naproxen for chronic arthritis.  She has hx of thrombocytopenia and leukopenia that is chronic. She states that was thought to be due to her antidepressive medications.  In Er patient started to have chest pain that lasted for about 30 min but now resolved. ECG was done that showed isolated ST depression in V5. Now she is chest pain free. Hg in ER was repeated and was found to be 5.9, she was hemoccult positive. Hospitalist was called for an admission .  Review of Systems:     Pertinent positives include: fatigue,  chest pain, shortness of breath at rest.  dyspnea on exertion,  Melena? Constitutional:  No weight loss, night sweats, Fevers, chills,  , weight loss  HEENT:  No headaches, Difficulty swallowing,Tooth/dental problems,Sore throat,  No  sneezing, itching, ear ache, nasal congestion, post nasal drip,  Cardio-vascular:  No  Orthopnea, PND, anasarca, dizziness, palpitations.no Bilateral lower extremity swelling  GI:  No heartburn, indigestion, abdominal pain, nausea, vomiting, diarrhea, change in bowel habits, loss of appetite, hematemesis Resp:  noNo excess mucus, no productive cough, No non-productive cough, No coughing up of blood.No change in color of mucus.No wheezing. Skin:  no rash or lesions. No jaundice GU:  no dysuria, change in color of urine, no urgency or frequency. No straining to urinate.  No flank pain.  Musculoskeletal:  No joint pain or no joint swelling. No decreased range of motion. No back pain.  Psych:  No change in mood or affect. No depression or anxiety. No memory loss.  Neuro: no localizing neurological complaints, no tingling, no weakness, no double vision, no gait abnormality, no slurred speech, no confusion  Otherwise ROS are negative except for above, 10 systems were reviewed  Past Medical History: Past Medical History  Diagnosis Date  . Arthritis   . Allergic rhinitis   . Colitis   . Degenerative disc disease   . Hyperlipidemia   . Hypertension   . Celiac disease   . Bipolar disorder   . Macular degeneration   . Interstitial cystitis   . Diabetes mellitus without complication    Past Surgical History  Procedure Laterality Date  . Tubal ligation    . Appendectomy    . Lumbar laminectomy    . Carpal tunnel release      bilateral  . Cesarean section    .  Cysto/ hod/ instillatio clorpactin  07-03-2008  &  06-09-2001    INTERSTITIAL CYSTITIS  . Shoulder arthroscopy w/ rotator cuff repair  01-25-2008    LEFT SHOULDER /   INCLUDING CAPSULECTOMY WITH DEBRIDEMENT/  SAD WITH RESECTION OF AC  . Right shoulder arthroscopy/ open resection distal clavicle/ debridement adhesive capsulitis/ rotator cuff repair  08-30-2003  . Tonsillectomy and adenoidectomy    . Transthoracic  echocardiogram  03-28-2011    NORMAL LVSF/ EF 60-65%/ VERY MILD AORTIC STENOSIS, TRIVIAL REGURG./ MILDLY DILATED LEFT ATRIUM  . Cystoscopy  11/26/2011    Procedure: CYSTOSCOPY;  Surgeon: Valetta Fuller, MD;  Location: [facility];  Service: Urology;  Laterality: N/A;  clorpactin  in bladder     Medications: Prior to Admission medications   Medication Sig Start Date End Date Taking? Authorizing Provider  ARIPiprazole (ABILIFY) 10 MG tablet Take 10 mg by mouth daily.    Yes Historical Provider, MD  fexofenadine (ALLEGRA) 180 MG tablet Take 180 mg by mouth daily.     Yes Historical Provider, MD  FLUoxetine (PROZAC) 40 MG capsule Take 40 mg by mouth daily.     Yes Historical Provider, MD  fluticasone (FLONASE) 50 MCG/ACT nasal spray Place 2 sprays into the nose daily.   Yes Historical Provider, MD  hydrochlorothiazide (HYDRODIURIL) 25 MG tablet Take 25 mg by mouth daily.   Yes Historical Provider, MD  lamoTRIgine (LAMICTAL) 200 MG tablet Take 200 mg by mouth at bedtime.     Yes Historical Provider, MD  LORazepam (ATIVAN) 0.5 MG tablet Take 0.5 mg by mouth 2 (two) times daily.    Yes Historical Provider, MD  mesalamine (LIALDA) 1.2 G EC tablet Take 1,200 mg by mouth 2 (two) times daily.   Yes Historical Provider, MD  metFORMIN (GLUCOPHAGE) 500 MG tablet Take 1 tablet (500 mg total) by mouth 2 (two) times daily with a meal. 08/19/12  Yes Sheliah Hatch, MD  Multiple Vitamin (MULTIVITAMIN) tablet Take 1 tablet by mouth daily.     Yes Historical Provider, MD  naproxen (NAPROSYN) 500 MG tablet Take 500 mg by mouth daily.   Yes Historical Provider, MD  pentosan polysulfate (ELMIRON) 100 MG capsule Take 200 mg by mouth 2 (two) times daily.    Yes Historical Provider, MD  traZODone (DESYREL) 50 MG tablet Take 25 mg by mouth at bedtime.     Yes Historical Provider, MD  Vitamin D, Ergocalciferol, (DRISDOL) 50000 UNITS CAPS Take 50,000 Units by mouth every 7 (seven) days. Friday   Yes  Historical Provider, MD    Allergies:   Allergies  Allergen Reactions  . Sulfonamide Derivatives     Classic angioedema reaction  . Wheat Bran     Celiac's disease    Social History:  Ambulatory  independently   Lives at  Home with family   reports that she has never smoked. She has never used smokeless tobacco. She reports that she does not drink alcohol or use illicit drugs.   Family History: family history includes Diabetes in her brother and mother; Hyperlipidemia in her brother; and Hypertension in her brother, mother, and unspecified family member.  There is no history of Colon cancer.    Physical Exam: Patient Vitals for the past 24 hrs:  BP Temp Temp src Pulse Resp SpO2  10/29/12 1948 107/53 mmHg 98 F (36.7 C) Oral 80 15 -  10/29/12 1631 118/60 mmHg 98.3 F (36.8 C) Oral 80 18 100 %    1.  General:  in No Acute distress 2. Psychological: Alert and  Oriented 3. Head/ENT:   Moist  Mucous Membranes                          Head Non traumatic, neck supple                          Normal   Dentition                          Pale conjuctiva 4. SKIN: normal  Skin turgor,  Skin clean Dry and intact no rash, pale 5. Heart: Regular rate and rhythm systolic Murmur noted, no Rub or gallop 6. Lungs: Clear to auscultation bilaterally, no wheezes or crackles   7. Abdomen: Soft, non-tender, Non distended 8. Lower extremities: no clubbing, cyanosis, or edema 9. Neurologically Grossly intact, moving all 4 extremities equally 10. MSK: Normal range of motion  body mass index is unknown because there is no weight on file.   Labs on Admission:   Recent Labs  10/29/12 1730  NA 136  K 3.4*  CL 103  CO2 26  GLUCOSE 99  BUN 7  CREATININE 0.46*  CALCIUM 8.9   No results found for this basename: AST, ALT, ALKPHOS, BILITOT, PROT, ALBUMIN,  in the last 72 hours No results found for this basename: LIPASE, AMYLASE,  in the last 72 hours  Recent Labs  10/29/12 1730  WBC  2.0*  NEUTROABS 1.1*  HGB 5.9*  HCT 20.3*  MCV 68.8*  PLT 114*   No results found for this basename: CKTOTAL, CKMB, CKMBINDEX, TROPONINI,  in the last 72 hours No results found for this basename: TSH, T4TOTAL, FREET3, T3FREE, THYROIDAB,  in the last 72 hours  Recent Labs  10/29/12 1730  RETICCTPCT 1.9   No results found for this basename: HGBA1C    The CrCl is unknown because both a height and weight (above a minimum accepted value) are required for this calculation. ABG No results found for this basename: phart, pco2, po2, hco3, tco2, acidbasedef, o2sat     Lab Results  Component Value Date   DDIMER 0.99* 09/20/2012     Other results:  I have pearsonaly reviewed this: ECG REPORT  Rate: 83  Rhythm: NSR ST&T Change: <25mm ST depression in V5    Cultures: No results found for this basename: sdes, specrequest, cult, reptstatus       Radiological Exams on Admission: No results found.  Chart has been reviewed  Assessment/Plan  57 year old female with history of Crohn's disease here with symptomatic anemia likely due  tocombination of slow upper GI bleed and diminished bone marrow function due to long-standing psychiatric medications.  Present on Admission:  . Anemia - of mucus and step down as patient's symptomatic with soft blood pressures we'll transfuse 2 units of red blood cells, give IV Protonix. GI consult in a.m. spoke to Dr. Leone Payor who is aware. N.p.o. post midnight for possible endoscopy . Will not discontinue psychiatric medications acutely this is something that should be done under the care of her psychiatrist. As an outpatient she probably needs to have this readdressed. According to the chart she had had trouble with thrombocytopenia and leukopenia for many years  . HYPERTENSION - hold home medications due to soft blood pressures  . DIABETES MELLITUS - sensitive sliding scale  . CROHN'S DISEASE-LARGE INTESTINE - last  colonoscopy was unremarkable continue  mesalamine  . 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 Monitor and step down. Given history of leg swelling obtain echo gram 2 x-rays no evidence of heart failure.  . Occult blood in stools - GI consult of where will need either inpatient or outpatient endoscopy have had recent colonoscopy. Likely upper GI source. Protonix IV.  Marland Kitchen Hypokalemia - will replace   Prophylaxis: SCD Protonix  CODE STATUS:FULL CODE per patient's wishes  Other plan as per orders.  I have spent a total of 65 min on this admission.  Time taken to discuss care of of GI .   Claudell Wohler 10/29/2012, 8:11 PM

## 2012-10-29 NOTE — ED Notes (Signed)
Pt was sent here from PCP due to low hemoglobin 6.1. Pt reports feeling fatigued and tired all the time as well as having SOB when walking up the stairs.

## 2012-10-29 NOTE — Progress Notes (Signed)
  Subjective:    Patient ID: Sabrina [name], female    DOB: [birth year], 57 y.o.   MRN: [id number]  HPI Edema- pt reports bilateral foot and ankle swelling x10 days.  Pt reports this is painful and 'tight feeling'.  Swelling improves w/ elevation.  No changes in diet, activity level, meds.  Elmiron recently increased.  Denies increased salt in diet.  No swelling of hands.  Known SEM, ECHO in 2012 showed normal EF.  No CP, SOB, HAs, visual changes.   Review of Systems For ROS see HPI     Objective:   Physical Exam  Vitals reviewed. Constitutional: She is oriented to person, place, and time. She appears well-developed and well-nourished. No distress.  HENT:  Head: Normocephalic and atraumatic.  Eyes: Conjunctivae and EOM are normal. Pupils are equal, round, and reactive to light.  Neck: Normal range of motion. Neck supple. No thyromegaly present.  Cardiovascular: Normal rate, regular rhythm and intact distal pulses.   Murmur (II-III/VI) heard. Pulmonary/Chest: Effort normal and breath sounds normal. No respiratory distress.  Abdominal: Soft. She exhibits no distension. There is no tenderness.  Musculoskeletal: She exhibits edema (trace pitting edema of ankles bilaterally).  Lymphadenopathy:    She has no cervical adenopathy.  Neurological: She is alert and oriented to person, place, and time.  Skin: Skin is warm and dry.  Psychiatric: She has a normal mood and affect. Her behavior is normal.          Assessment & Plan:

## 2012-10-29 NOTE — Patient Instructions (Addendum)
This is most likely a combo of factors- salt, heat, sitting Try and elevate as much as possible Be mindful of the salt in your diet Drink plenty of fluids- if you're dehydrated you will retain fluid Call with any questions or concerns Hang in there!

## 2012-10-29 NOTE — Assessment & Plan Note (Signed)
New.  Pt's sxs are not impressive on PE today but pt reports they worsen w/ prolonged sitting.  No fluid retention in hands, no SOB.  Suspect this is due to salt intake, inactivity, and recent heat.  Check labs to r/o kidney or liver failure, thyroid abnormality, anemia.  Reviewed supportive care and red flags that should prompt return.  Pt expressed understanding and is in agreement w/ plan.

## 2012-10-29 NOTE — Telephone Encounter (Signed)
Pt contaced and told to seek treatment with ER. Pt stated she was leaving work and going right there.

## 2012-10-29 NOTE — ED Provider Notes (Signed)
History     CSN: [id number]  Arrival date & time 10/29/12  1623   First MD Initiated Contact with Patient 10/29/12 1634       chief complaint: Anemia   The history is provided by the patient and medical records.   The patient was sent to the emergency department today by her primary care physician's office after her hemoglobin was noted to be 6.1.  The patient presented because of increased fatigue and weakness over the past several months with worsening dyspnea with exertion.  She has a history of Crohn's disease.  She denies abdominal pain.  No nausea vomiting or diarrhea.  She does feel like her stools been darker over the past several days.  She has not use anticoagulants.  She does use incensed.  No upper abdominal pain.  No fevers or chills.  She's required one blood transfusion before in the past.  She is oriented iron supplementation.  Her last blood transfusion was several years ago.  Her symptoms are mild in severity   Past Medical History  Diagnosis Date  . Arthritis   . Allergic rhinitis   . Colitis   . Degenerative disc disease   . Hyperlipidemia   . Hypertension   . Celiac disease   . Bipolar disorder   . Macular degeneration   . Interstitial cystitis   . Diabetes mellitus without complication     Past Surgical History  Procedure Laterality Date  . Tubal ligation    . Appendectomy    . Lumbar laminectomy    . Carpal tunnel release      bilateral  . Cesarean section    . Cysto/ hod/ instillatio clorpactin  07-03-2008  &  06-09-2001    INTERSTITIAL CYSTITIS  . Shoulder arthroscopy w/ rotator cuff repair  01-25-2008    LEFT SHOULDER /   INCLUDING CAPSULECTOMY WITH DEBRIDEMENT/  SAD WITH RESECTION OF AC  . Right shoulder arthroscopy/ open resection distal clavicle/ debridement adhesive capsulitis/ rotator cuff repair  08-30-2003  . Tonsillectomy and adenoidectomy    . Transthoracic echocardiogram  03-28-2011    NORMAL LVSF/ EF 60-65%/ VERY MILD AORTIC STENOSIS,  TRIVIAL REGURG./ MILDLY DILATED LEFT ATRIUM  . Cystoscopy  11/26/2011    Procedure: CYSTOSCOPY;  Surgeon: Valetta Fuller, MD;  Location: [facility];  Service: Urology;  Laterality: N/A;  clorpactin  in bladder    Family History  Problem Relation Age of Onset  . Diabetes Mother   . Hypertension Mother   . Diabetes Brother   . Hypertension Brother   . Hyperlipidemia Brother   . Hypertension    . Colon cancer Neg Hx     History  Substance Use Topics  . Smoking status: Never Smoker   . Smokeless tobacco: Never Used  . Alcohol Use: No    OB History   Grav Para Term Preterm Abortions TAB SAB Ect Mult Living                  Review of Systems  All other systems reviewed and are negative.    Allergies  Sulfonamide derivatives and Wheat bran  Home Medications   Current Outpatient Rx  Name  Route  Sig  Dispense  Refill  . ARIPiprazole (ABILIFY) 10 MG tablet   Oral   Take 10 mg by mouth daily.          . fexofenadine (ALLEGRA) 180 MG tablet   Oral   Take 180 mg by mouth daily.           Marland Kitchen  FLUoxetine (PROZAC) 40 MG capsule   Oral   Take 40 mg by mouth daily.           . fluticasone (FLONASE) 50 MCG/ACT nasal spray   Nasal   Place 2 sprays into the nose daily.         . hydrochlorothiazide (HYDRODIURIL) 25 MG tablet   Oral   Take 25 mg by mouth daily.         Marland Kitchen lamoTRIgine (LAMICTAL) 200 MG tablet   Oral   Take 200 mg by mouth at bedtime.           Marland Kitchen LORazepam (ATIVAN) 0.5 MG tablet   Oral   Take 0.5 mg by mouth 2 (two) times daily.          . mesalamine (LIALDA) 1.2 G EC tablet   Oral   Take 1,200 mg by mouth 2 (two) times daily.         . metFORMIN (GLUCOPHAGE) 500 MG tablet   Oral   Take 1 tablet (500 mg total) by mouth 2 (two) times daily with a meal.   180 tablet   3   . Multiple Vitamin (MULTIVITAMIN) tablet   Oral   Take 1 tablet by mouth daily.           . pentosan polysulfate (ELMIRON) 100 MG capsule    Oral   Take 100 mg by mouth 2 (two) times daily. Take 2 tablets in the morning and 2 tablets in the evening.         . traZODone (DESYREL) 50 MG tablet   Oral   Take 25 mg by mouth at bedtime.           . Vitamin D, Ergocalciferol, (DRISDOL) 50000 UNITS CAPS   Oral   Take 50,000 Units by mouth every 7 (seven) days. Friday           BP 118/60  Pulse 80  Temp(Src) 98.3 F (36.8 C) (Oral)  Resp 18  SpO2 100%  Physical Exam  Nursing note and vitals reviewed. Constitutional: She is oriented to person, place, and time. She appears well-developed and well-nourished. No distress.  HENT:  Head: Normocephalic and atraumatic.  Eyes: EOM are normal.  Neck: Normal range of motion.  Cardiovascular: Normal rate, regular rhythm and normal heart sounds.   Pulmonary/Chest: Effort normal and breath sounds normal.  Abdominal: Soft. She exhibits no distension. There is no tenderness.  Genitourinary:  Brown stool.  Hemoccult positive  Musculoskeletal: Normal range of motion.  Neurological: She is alert and oriented to person, place, and time.  Skin: Skin is warm and dry.  Psychiatric: She has a normal mood and affect. Judgment normal.    ED Course  Procedures (including critical care time)  Labs Reviewed  CBC WITH DIFFERENTIAL - Abnormal; Notable for the following:    WBC 2.0 (*)    RBC 2.95 (*)    Hemoglobin 5.9 (*)    HCT 20.3 (*)    MCV 68.8 (*)    MCH 20.0 (*)    MCHC 29.1 (*)    RDW 16.9 (*)    Platelets 114 (*)    Neutro Abs 1.1 (*)    All other components within normal limits  BASIC METABOLIC PANEL - Abnormal; Notable for the following:    Potassium 3.4 (*)    Creatinine, Ser 0.46 (*)    All other components within normal limits  RETICULOCYTES - Abnormal; Notable for the following:  RBC. 2.95 (*)    All other components within normal limits  OCCULT BLOOD, POC DEVICE - Abnormal; Notable for the following:    Fecal Occult Bld POSITIVE (*)    All other components  within normal limits  VITAMIN B12  FOLATE  IRON AND TIBC  FERRITIN  TYPE AND SCREEN  PREPARE RBC (CROSSMATCH)  ABO/RH   No results found.   1. Anemia   2. Fatigue   3. Exertional dyspnea       MDM  Symptomatic anemia with hemoglobin of 5.9.  This is likely secondary to slow GI loss.  Blood transfusion.  Patient we admitted the hospital        Lyanne Co, MD 10/29/12 1910

## 2012-10-30 ENCOUNTER — Encounter (HOSPITAL_COMMUNITY): Payer: Self-pay | Admitting: Internal Medicine

## 2012-10-30 DIAGNOSIS — K254 Chronic or unspecified gastric ulcer with hemorrhage: Secondary | ICD-10-CM | POA: Diagnosis present

## 2012-10-30 DIAGNOSIS — I10 Essential (primary) hypertension: Secondary | ICD-10-CM

## 2012-10-30 DIAGNOSIS — F319 Bipolar disorder, unspecified: Secondary | ICD-10-CM

## 2012-10-30 DIAGNOSIS — K5010 Crohn's disease of large intestine without complications: Secondary | ICD-10-CM

## 2012-10-30 DIAGNOSIS — R195 Other fecal abnormalities: Secondary | ICD-10-CM

## 2012-10-30 DIAGNOSIS — K900 Celiac disease: Secondary | ICD-10-CM

## 2012-10-30 DIAGNOSIS — D509 Iron deficiency anemia, unspecified: Secondary | ICD-10-CM

## 2012-10-30 DIAGNOSIS — E876 Hypokalemia: Secondary | ICD-10-CM

## 2012-10-30 HISTORY — DX: Iron deficiency anemia, unspecified: D50.9

## 2012-10-30 LAB — CBC
Hemoglobin: 7.3 g/dL — ABNORMAL LOW (ref 12.0–15.0)
Hemoglobin: 8.9 g/dL — ABNORMAL LOW (ref 12.0–15.0)
MCHC: 30 g/dL (ref 30.0–36.0)
Platelets: 117 10*3/uL — ABNORMAL LOW (ref 150–400)
RBC: 4.01 MIL/uL (ref 3.87–5.11)
RDW: 17.9 % — ABNORMAL HIGH (ref 11.5–15.5)
WBC: 2.1 10*3/uL — ABNORMAL LOW (ref 4.0–10.5)
WBC: 2.8 10*3/uL — ABNORMAL LOW (ref 4.0–10.5)

## 2012-10-30 LAB — COMPREHENSIVE METABOLIC PANEL
Albumin: 2.2 g/dL — ABNORMAL LOW (ref 3.5–5.2)
BUN: 5 mg/dL — ABNORMAL LOW (ref 6–23)
Calcium: 8.1 mg/dL — ABNORMAL LOW (ref 8.4–10.5)
Chloride: 109 mEq/L (ref 96–112)
Creatinine, Ser: 0.49 mg/dL — ABNORMAL LOW (ref 0.50–1.10)
Total Bilirubin: 1.6 mg/dL — ABNORMAL HIGH (ref 0.3–1.2)

## 2012-10-30 LAB — IRON AND TIBC: UIBC: 504 ug/dL — ABNORMAL HIGH (ref 125–400)

## 2012-10-30 LAB — MRSA PCR SCREENING: MRSA by PCR: NEGATIVE

## 2012-10-30 LAB — GLUCOSE, CAPILLARY
Glucose-Capillary: 103 mg/dL — ABNORMAL HIGH (ref 70–99)
Glucose-Capillary: 106 mg/dL — ABNORMAL HIGH (ref 70–99)
Glucose-Capillary: 114 mg/dL — ABNORMAL HIGH (ref 70–99)
Glucose-Capillary: 122 mg/dL — ABNORMAL HIGH (ref 70–99)

## 2012-10-30 LAB — MAGNESIUM: Magnesium: 1.8 mg/dL (ref 1.5–2.5)

## 2012-10-30 LAB — TSH: TSH: 2.104 u[IU]/mL (ref 0.350–4.500)

## 2012-10-30 LAB — PHOSPHORUS: Phosphorus: 3.3 mg/dL (ref 2.3–4.6)

## 2012-10-30 LAB — FOLATE: Folate: >20 ng/mL

## 2012-10-30 MED ORDER — SODIUM CHLORIDE 0.9 % IV SOLN: INTRAVENOUS | Status: DC

## 2012-10-30 MED ORDER — SODIUM CHLORIDE 0.9 % IV BOLUS (SEPSIS)
500.0000 mL | Freq: Once | INTRAVENOUS | Status: AC
  Administered 2012-10-30: 500 mL via INTRAVENOUS

## 2012-10-30 MED ORDER — PANTOPRAZOLE SODIUM 40 MG PO TBEC: 40.0000 mg | DELAYED_RELEASE_TABLET | Freq: Every day | ORAL | Status: DC

## 2012-10-30 NOTE — Discharge Summary (Signed)
Physician Discharge Summary  Sabrina [name] JWJ:[id number] DOB: 1955-09-19 DOA: 10/29/2012  PCP: Neena Rhymes, MD  Admit date: 10/29/2012 Discharge date: 10/30/2012    Recommendations for Outpatient Follow-up:  1. Follow up with GI as recommended.   Discharge Diagnoses:  Active Problems:   DIABETES MELLITUS   HYPERTENSION   CROHN'S DISEASE-LARGE INTESTINE   Chest pain   Anemia   Occult blood in stools   Hypokalemia   Heme + stool   Anemia, iron deficiency   Discharge Condition: fair  Diet recommendation: REGULAR   Filed Weights   10/29/12 2325 10/30/12 0400  Weight: 69.3 kg (152 lb 12.5 oz) 71.8 kg (158 lb 4.6 oz)    History of present illness:  Sabrina [name] is a 57 y.o. female  has a past medical history of Arthritis; Allergic rhinitis; Colitis; Degenerative disc disease; Hyperlipidemia; Hypertension; Celiac disease; Bipolar disorder; Macular degeneration; Interstitial cystitis; and Diabetes mellitus without complication.  Presented with  Patient states she have had bilateral ankle swelling for the past 2 weeks. And presented to her PC Who ordered blood work and called patient with Hg of 6.1. Patient presented to ER. She also endorses feeling lightheaded, fatigued for months, some shortness of breath. Endorses having dark stools but not black for about 1 month. She has hx of anemia in the past and had to have a transfusion 8 years ago prior to having surgery. She have had screening colonoscopy in January and it was normal. Reports having endoscopy 4 years ago that was unremarkable.  At one point she had a colonoscopy done in 2008 that showed questionable crohn's disease. She is still taking Mesalamine.  She denies any epigastric pain, no hematemesis. She Does take daily naproxen for chronic arthritis.  She has hx of thrombocytopenia and leukopenia that is chronic. She states that was thought to be due to her antidepressive medications.  In Er patient started to have  chest pain that lasted for about 30 min but now resolved. ECG was done that showed isolated ST depression in V5. Now she is chest pain free.  Hg in ER was repeated and was found to be 5.9, she was hemoccult positive. Hospitalist was called for an admission  .    Hospital Course:  1. Anemia: microcytic, with low ferritin levels. possibly from use of naproxen . Recent colonoscopy is normal. GI consult called recommended outpatient work up. She received 3 units of PRBC transfusion. And her rpeat H&H stable. She is being discharged on oral protonix.  1. Crohns diease: stable on mesalamine.  2. Hypertension: hold hydrochlorthiazide for soft BP.   3. Diabetes Mellitus;  Metformin resumed on discharge.  CBG (last 3)   Recent Labs   10/30/12 0007  10/30/12 0407   GLUCAP  103*  106*    4. Depression and Anxiety: resume home medications.  5. Pancytopenia: unsure if this is secondary to medications vs BM suppression. Will need outpatient BM biopsy.  6. Chest pain: resolved. Possibly GERD. Cardiac enzyme is negative. EKG shows sinus rhythm, and T wave abnormalities in the lateral leads. Echocardiogram ordered , showed good LVEF, but aortic stenosis.  7. Lower extremity edema; resolved.  8. Hypokalemia: repleted.   Consultations:  gastroenterology  Discharge Exam: Filed Vitals:   10/30/12 1600 10/30/12 1652 10/30/12 1654 10/30/12 1656  BP:  122/61 122/53 119/56  Pulse:   74 76  Temp: 98.9 F (37.2 C)     TempSrc: Oral     Resp:   17  14  Height:      Weight:      SpO2:   99% 100%    5. Heart: Regular rate and rhythm systolic Murmur noted, no Rub or gallop  6. Lungs: Clear to auscultation bilaterally, no wheezes or crackles  7. Abdomen: Soft, non-tender, Non distended  8. Lower extremities: no clubbing, cyanosis, or edema  9. Neurologically Grossly intact, moving all 4 extremities equally   Discharge Instructions  Discharge Orders   Future Orders Complete By Expires     Diet -  low sodium heart healthy  As directed     Discharge instructions  As directed     Comments:      Follow up with GI as recommended.        Medication List    STOP taking these medications       hydrochlorothiazide 25 MG tablet  Commonly known as:  HYDRODIURIL     naproxen 500 MG tablet  Commonly known as:  NAPROSYN      TAKE these medications       ARIPiprazole 10 MG tablet  Commonly known as:  ABILIFY  Take 10 mg by mouth daily.     fexofenadine 180 MG tablet  Commonly known as:  ALLEGRA  Take 180 mg by mouth daily.     FLUoxetine 40 MG capsule  Commonly known as:  PROZAC  Take 40 mg by mouth daily.     fluticasone 50 MCG/ACT nasal spray  Commonly known as:  FLONASE  Place 2 sprays into the nose daily.     lamoTRIgine 200 MG tablet  Commonly known as:  LAMICTAL  Take 200 mg by mouth at bedtime.     LORazepam 0.5 MG tablet  Commonly known as:  ATIVAN  Take 0.5 mg by mouth 2 (two) times daily.     mesalamine 1.2 G EC tablet  Commonly known as:  LIALDA  Take 1,200 mg by mouth 2 (two) times daily.     metFORMIN 500 MG tablet  Commonly known as:  GLUCOPHAGE  Take 1 tablet (500 mg total) by mouth 2 (two) times daily with a meal.     multivitamin tablet  Take 1 tablet by mouth daily.     pantoprazole 40 MG tablet  Commonly known as:  PROTONIX  Take 1 tablet (40 mg total) by mouth daily.     pentosan polysulfate 100 MG capsule  Commonly known as:  ELMIRON  Take 200 mg by mouth 2 (two) times daily.     traZODone 50 MG tablet  Commonly known as:  DESYREL  Take 25 mg by mouth at bedtime.     Vitamin D (Ergocalciferol) 50000 UNITS Caps  Commonly known as:  DRISDOL  Take 50,000 Units by mouth every 7 (seven) days. Friday       Allergies  Allergen Reactions  . Sulfonamide Derivatives     Classic angioedema reaction  . Wheat Bran     Celiac's disease      The results of significant diagnostics from this hospitalization (including imaging,  microbiology, ancillary and laboratory) are listed below for reference.    Significant Diagnostic Studies: No results found.  Microbiology: Recent Results (from the past 240 hour(s))  MRSA PCR SCREENING     Status: None   Collection Time    10/29/12 11:27 PM      Result Value Range Status   MRSA by PCR NEGATIVE  NEGATIVE Final   Comment:  The GeneXpert MRSA Assay (FDA     approved for NASAL specimens     only), is one component of a     comprehensive MRSA colonization     surveillance program. It is not     intended to diagnose MRSA     infection nor to guide or     monitor treatment for     MRSA infections.     Labs: Basic Metabolic Panel:  Recent Labs Lab 10/29/12 1730 10/30/12 0544  NA 136 139  K 3.4* 3.8  CL 103 109  CO2 26 25  GLUCOSE 99 106*  BUN 7 5*  CREATININE 0.46* 0.49*  CALCIUM 8.9 8.1*  MG  --  1.8  PHOS  --  3.3   Liver Function Tests:  Recent Labs Lab 10/30/12 0544  AST 46*  ALT 28  ALKPHOS 82  BILITOT 1.6*  PROT 5.9*  ALBUMIN 2.2*   No results found for this basename: LIPASE, AMYLASE,  in the last 168 hours No results found for this basename: AMMONIA,  in the last 168 hours CBC:  Recent Labs Lab 10/29/12 1730 10/30/12 0544 10/30/12 1755  WBC 2.0* 2.1* 2.8*  NEUTROABS 1.1*  --   --   HGB 5.9* 7.3* 8.9*  HCT 20.3* 24.3* 28.9*  MCV 68.8* 71.9* 72.1*  PLT 114* 106* 117*   Cardiac Enzymes:  Recent Labs Lab 10/29/12 1955  TROPONINI <0.30   BNP: BNP (last 3 results) No results found for this basename: PROBNP,  in the last 8760 hours CBG:  Recent Labs Lab 10/30/12 0007 10/30/12 0407 10/30/12 0752 10/30/12 1232 10/30/12 1554  GLUCAP 103* 106* 122* 107* 114*       Signed:  Rola Lennon  [facility] 10/30/2012, 10:55 PM

## 2012-10-30 NOTE — Progress Notes (Signed)
*  PRELIMINARY RESULTS* Echocardiogram 2D Echocardiogram has been performed.  Sabrina [name] 10/30/2012, 9:53 AM

## 2012-10-30 NOTE — Progress Notes (Signed)
[facility] PROGRESS NOTE  Sabrina [name] ZOX:[id number] DOB: 1956-01-18 DOA: 10/29/2012 PCP: Sabrina Rhymes, MD  Assessment/Plan: 1. Anemia: microcytic, with low ferritin levels. possibly from GI bleed from recent use of naproxen. Her stool for occult blood positive. She received 2 unitso f PRBC transfusion and her H&h improved to 7.3 , will order 1 more unit today. On IV protonix, and IV fluids. She underwent  A colonoscopy in January. GI consult from Dr Sabrina [name] requested last night. She is currently NPO in prep for upper endoscopy.  1. Crohns diease: stable on mesalamine.  2. Hypertension: blood pressure are soft, on IV fluids, holding all blood pressure medications.  3. Diabetes Mellitus; hgba1c will be ordered and SSI. Metformin held.   CBG (last 3)   Recent Labs  10/30/12 0007 10/30/12 0407  GLUCAP 103* 106*    4. Depression and Anxiety: resume home medications.   5. Pancytopenia: unsure if this is secondary to medications vs BM suppression. Will need outpatient BM biopsy.   6. Chest pain: resolved. Cardiac enzyme is negative. EKG shows sinus rhythm, and T wave abnormalities in the lateral leads. Echocardiogram ordered to evaluate LVEF and wall abnormalities.   7. Lower extremity edema; echo ordered to evaluate for heart failure .   8. Hypokalemia: repleted.   9. DVT prophylaxis: SCD'S/  Code Status: full code Family Communication: none at bedside Disposition Plan: pending.    Consultants:  GI consult called  HPI/Subjective: Comfortable,   Objective: Filed Vitals:   10/30/12 0230 10/30/12 0310 10/30/12 0400 10/30/12 0630  BP: 98/55 88/45 85/38  97/39  Pulse: 76 71 72 72  Temp:   98.4 F (36.9 C)   TempSrc:   Oral   Resp: 14 12 14 11   Height:      Weight:   71.8 kg (158 lb 4.6 oz)   SpO2: 96% 96% 95% 99%    Intake/Output Summary (Last 24 hours) at 10/30/12 0707 Last data filed at 10/30/12 0600  Gross per 24 hour  Intake 1312.5 ml  Output       0 ml  Net 1312.5 ml   Filed Weights   10/29/12 2325 10/30/12 0400  Weight: 69.3 kg (152 lb 12.5 oz) 71.8 kg (158 lb 4.6 oz)    Exam:  5. Heart: Regular rate and rhythm systolic Murmur noted, no Rub or gallop  6. Lungs: Clear to auscultation bilaterally, no wheezes or crackles  7. Abdomen: Soft, non-tender, Non distended  8. Lower extremities: no clubbing, cyanosis, or edema  9. Neurologically Grossly intact, moving all 4 extremities equally   Data Reviewed: Basic Metabolic Panel:  Recent Labs Lab 10/29/12 1730 10/30/12 0544  NA 136 139  K 3.4* 3.8  CL 103 109  CO2 26 25  GLUCOSE 99 106*  BUN 7 5*  CREATININE 0.46* 0.49*  CALCIUM 8.9 8.1*  MG  --  1.8  PHOS  --  3.3   Liver Function Tests:  Recent Labs Lab 10/30/12 0544  AST 46*  ALT 28  ALKPHOS 82  BILITOT 1.6*  PROT 5.9*  ALBUMIN 2.2*   No results found for this basename: LIPASE, AMYLASE,  in the last 168 hours No results found for this basename: AMMONIA,  in the last 168 hours CBC:  Recent Labs Lab 10/29/12 1730 10/30/12 0544  WBC 2.0* 2.1*  NEUTROABS 1.1*  --   HGB 5.9* 7.3*  HCT 20.3* 24.3*  MCV 68.8* 71.9*  PLT 114* 106*   Cardiac Enzymes:  Recent Labs  Lab 10/29/12 1955  TROPONINI <0.30   BNP (last 3 results) No results found for this basename: PROBNP,  in the last 8760 hours CBG:  Recent Labs Lab 10/30/12 0007 10/30/12 0407  GLUCAP 103* 106*    Recent Results (from the past 240 hour(s))  MRSA PCR SCREENING     Status: None   Collection Time    10/29/12 11:27 PM      Result Value Range Status   MRSA by PCR NEGATIVE  NEGATIVE Final   Comment:            The GeneXpert MRSA Assay (FDA     approved for NASAL specimens     only), is one component of a     comprehensive MRSA colonization     surveillance program. It is not     intended to diagnose MRSA     infection nor to guide or     monitor treatment for     MRSA infections.     Studies: No results  found.  Scheduled Meds: . ARIPiprazole  10 mg Oral Daily  . FLUoxetine  40 mg Oral Daily  . insulin aspart  0-9 Units Subcutaneous Q4H  . lamoTRIgine  200 mg Oral QHS  . LORazepam  0.5 mg Oral BID  . mesalamine  1,200 mg Oral BID  . pantoprazole (PROTONIX) IV  40 mg Intravenous QHS  . pentosan polysulfate  200 mg Oral BID  . sodium chloride  3 mL Intravenous Q12H  . traZODone  25 mg Oral QHS   Continuous Infusions: . sodium chloride 75 mL/hr (10/30/12 0300)    Active Problems:   DIABETES MELLITUS   HYPERTENSION   CROHN'S DISEASE-LARGE INTESTINE   Chest pain   Anemia   Occult blood in stools   Hypokalemia    Time spent: 55 minutes     Sabrina [name]  [facility] Pager [phone]. If 7PM-7AM, please contact night-coverage at [email or web address], password [facility] 10/30/2012, 7:07 AM  LOS: 1 day

## 2012-10-30 NOTE — Consult Note (Addendum)
[facility] Consultation  Requesting Provider: Hospitalist Primary Care Physician:  Neena Rhymes, MD Primary Gastroenterologist:  Dr.Perry  Reason for Consultation:  Heme + anemia - declining Hgb  Assessment:  1) Chronic anemia w/ Hgb 8 to 6.0 in 6-7 weeks, ferritin 3 + iron-deficient    2) heme + no melena, hematochezia     3) celiac disease, not on gluten-free diet    4) Naprosyn use daily PTA- stopped      5) Crohn's colitis, left-sided, ok on recent colonoscopy    6) Long-standing pancytopenia - cause not clear    Recommendations: 1) DC home after transfusion complete    2) Will arrange outpatient EGD    3) No NSAID    4) daily PPI    5) gluten-free - she is more motivated to do so now    6) I have checked TTG Abs for celiac dz         HPI: Sabrina [name] is a 57 y.o. female with c/o ankle edema at PCP yesterday. Hgb was found to be 6.9. Was 8 in late March. Had been on daily Naprosyn 500 mg for arthragia/arthritis. No melena or hematochezia. Colonoscopy early 2014 was w/o active Crohn's. Told to come to ED and was admitted. Feeling better w/ less dyspnea and weakness after transfusions (3 U's). Hungry. She has always thought she could not maintain a gluten-free diet and has not followed one. GI ROS o/w negative.   Past Medical History  Diagnosis Date  . Arthritis   . Allergic rhinitis   . Colitis-Crohn's   . Degenerative disc disease   . Hyperlipidemia   . Hypertension   . Celiac disease   . Bipolar disorder   . Macular degeneration   . Interstitial cystitis   . Diabetes mellitus without complication     Past Surgical History  Procedure Laterality Date  . Tubal ligation    . Appendectomy    . Lumbar laminectomy    . Carpal tunnel release      bilateral  . Cesarean section    . Cysto/ hod/ instillatio clorpactin  07-03-2008  &  06-09-2001    INTERSTITIAL CYSTITIS  . Shoulder arthroscopy w/ rotator cuff repair  01-25-2008    LEFT  SHOULDER /   INCLUDING CAPSULECTOMY WITH DEBRIDEMENT/  SAD WITH RESECTION OF AC  . Right shoulder arthroscopy/ open resection distal clavicle/ debridement adhesive capsulitis/ rotator cuff repair  08-30-2003  . Tonsillectomy and adenoidectomy    . Transthoracic echocardiogram  03-28-2011    NORMAL LVSF/ EF 60-65%/ VERY MILD AORTIC STENOSIS, TRIVIAL REGURG./ MILDLY DILATED LEFT ATRIUM  . Cystoscopy  11/26/2011    Procedure: CYSTOSCOPY;  Surgeon: Valetta Fuller, MD;  Location: [facility];  Service: Urology;  Laterality: N/A;  clorpactin  in bladder  Colonoscopy - multiple  Prior to Admission medications   Medication Sig Start Date End Date Taking? Authorizing Provider  ARIPiprazole (ABILIFY) 10 MG tablet Take 10 mg by mouth daily.    Yes Historical Provider, MD  fexofenadine (ALLEGRA) 180 MG tablet Take 180 mg by mouth daily.     Yes Historical Provider, MD  FLUoxetine (PROZAC) 40 MG capsule Take 40 mg by mouth daily.     Yes Historical Provider, MD  fluticasone (FLONASE) 50 MCG/ACT nasal spray Place 2 sprays into the nose daily.   Yes Historical Provider, MD  hydrochlorothiazide (HYDRODIURIL) 25 MG tablet Take 25 mg by mouth daily.   Yes Historical Provider, MD  lamoTRIgine (LAMICTAL) 200 MG tablet Take 200 mg by mouth at bedtime.     Yes Historical Provider, MD  LORazepam (ATIVAN) 0.5 MG tablet Take 0.5 mg by mouth 2 (two) times daily.    Yes Historical Provider, MD  mesalamine (LIALDA) 1.2 G EC tablet Take 1,200 mg by mouth 2 (two) times daily.   Yes Historical Provider, MD  metFORMIN (GLUCOPHAGE) 500 MG tablet Take 1 tablet (500 mg total) by mouth 2 (two) times daily with a meal. 08/19/12  Yes Sheliah Hatch, MD  Multiple Vitamin (MULTIVITAMIN) tablet Take 1 tablet by mouth daily.     Yes Historical Provider, MD  naproxen (NAPROSYN) 500 MG tablet Take 500 mg by mouth daily.   Yes Historical Provider, MD  pentosan polysulfate (ELMIRON) 100 MG capsule Take 200 mg by mouth 2  (two) times daily.    Yes Historical Provider, MD  traZODone (DESYREL) 50 MG tablet Take 25 mg by mouth at bedtime.     Yes Historical Provider, MD  Vitamin D, Ergocalciferol, (DRISDOL) 50000 UNITS CAPS Take 50,000 Units by mouth every 7 (seven) days. Friday   Yes Historical Provider, MD    Current Facility-Administered Medications  Medication Dose Route Frequency Provider Last Rate Last Dose  . 0.9 %  sodium chloride infusion   Intravenous Continuous Kathlen Mody, MD      . acetaminophen (TYLENOL) tablet 650 mg  650 mg Oral Q6H PRN Therisa Doyne, MD       Or  . acetaminophen (TYLENOL) suppository 650 mg  650 mg Rectal Q6H PRN Therisa Doyne, MD      . ARIPiprazole (ABILIFY) tablet 10 mg  10 mg Oral Daily Therisa Doyne, MD   10 mg at 10/30/12 1004  . FLUoxetine (PROZAC) capsule 40 mg  40 mg Oral Daily Therisa Doyne, MD   40 mg at 10/30/12 1004  . HYDROcodone-acetaminophen (NORCO/VICODIN) 5-325 MG per tablet 1-2 tablet  1-2 tablet Oral Q4H PRN Therisa Doyne, MD      . insulin aspart (novoLOG) injection 0-9 Units  0-9 Units Subcutaneous Q4H Therisa Doyne, MD   1 Units at 10/30/12 0818  . lamoTRIgine (LAMICTAL) tablet 200 mg  200 mg Oral QHS Therisa Doyne, MD   200 mg at 10/30/12 0036  . LORazepam (ATIVAN) tablet 0.5 mg  0.5 mg Oral BID Therisa Doyne, MD   0.5 mg at 10/30/12 1004  . mesalamine (LIALDA) EC tablet 1.2 g  1,200 mg Oral BID Therisa Doyne, MD   1.2 g at 10/30/12 1004  . ondansetron (ZOFRAN) tablet 4 mg  4 mg Oral Q6H PRN Therisa Doyne, MD       Or  . ondansetron (ZOFRAN) injection 4 mg  4 mg Intravenous Q6H PRN Therisa Doyne, MD      . pantoprazole (PROTONIX) injection 40 mg  40 mg Intravenous QHS Therisa Doyne, MD   40 mg at 10/30/12 0037  . pentosan polysulfate (ELMIRON) capsule 200 mg  200 mg Oral BID Therisa Doyne, MD   200 mg at 10/30/12 1003  . sodium chloride 0.9 % injection 3 mL  3 mL Intravenous Q12H Therisa Doyne, MD   3 mL at 10/30/12 1005  . traZODone (DESYREL) tablet 25 mg  25 mg Oral QHS Therisa Doyne, MD   25 mg at 10/30/12 0036    Allergies as of 10/29/2012 - Review Complete 10/29/2012  Allergen Reaction Noted  . Sulfonamide derivatives    . Wheat bran  07/07/2012    Family History  Problem Relation Age of Onset  . Diabetes Mother   . Hypertension Mother   . Diabetes Brother   . Hypertension Brother   . Hyperlipidemia Brother   . Hypertension    . Colon cancer Neg Hx     History   Social History  . Marital Status: Married    Spouse Name: N/A    Number of Children: N/A  . Years of Education: N/A   Occupational History  . Not on file.   Social History Main Topics  . Smoking status: Never Smoker   . Smokeless tobacco: Never Used  . Alcohol Use: No  . Drug Use: No  . Sexually Active: Not on file   Other Topics Concern  . Not on file   Social History Narrative   Daughter lives in [place], [place]    Review of Systems: All other review of systems negative except as mentioned in the HPI.  Physical Exam: Vital signs in last 24 hours: Temp:  [97.4 F (36.3 C)-98.5 F (36.9 C)] 98.1 F (36.7 C) (05/03 1530) Pulse Rate:  [70-80] 73 (05/03 1530) Resp:  [11-18] 13 (05/03 1530) BP: (85-118)/(27-83) 110/43 mmHg (05/03 1530) SpO2:  [95 %-100 %] 100 % (05/03 1530) Weight:  [152 lb 12.5 oz (69.3 kg)-158 lb 4.6 oz (71.8 kg)] 158 lb 4.6 oz (71.8 kg) (05/03 0400)   General:   Alert,  Well-developed, well-nourished, pleasant and cooperative in NAD Eyes:  Sclera clear, no icterus.   Conjunctiva pale Lungs:  Clear throughout to auscultation.   Heart:  Regular rate and rhythm; no murmurs, clicks, rubs,  or gallops. Abdomen:  Soft, nontender and nondistended. No masses, hepatosplenomegaly or hernias noted. Normal bowel sounds, without guarding, and without rebound.   Extremities:  Trace LE edema. Neurologic:  Alert and  oriented x 3 Psych:  Alert and cooperative.  Normal mood and affect.   Lab Results:  Recent Labs  10/29/12 1730 10/30/12 0544  WBC 2.0* 2.1*  HGB 5.9* 7.3*  HCT 20.3* 24.3*  PLT 114* 106*   BMET  Recent Labs  10/29/12 1730 10/30/12 0544  NA 136 139  K 3.4* 3.8  CL 103 109  CO2 26 25  GLUCOSE 99 106*  BUN 7 5*  CREATININE 0.46* 0.49*  CALCIUM 8.9 8.1*   LFT  Recent Labs  10/30/12 0544  PROT 5.9*  ALBUMIN 2.2*  AST 46*  ALT 28  ALKPHOS 82  BILITOT 1.6*   Lab Results  Component Value Date   VITAMINB12 1111* 10/29/2012   Lab Results  Component Value Date   FERRITIN 3* 10/29/2012    LOS: 1 day      @Kyle Stansell  Sena Slate, MD, [facility] [phone] (pager) 10/30/2012 3:45 PM@

## 2012-10-31 LAB — HEMOGLOBIN A1C
Hgb A1c MFr Bld: 5.8 % — ABNORMAL HIGH (ref <5.7)
Mean Plasma Glucose: 120 mg/dL — ABNORMAL HIGH (ref <117)

## 2012-11-01 ENCOUNTER — Telehealth: Payer: Self-pay | Admitting: Internal Medicine

## 2012-11-01 LAB — BASIC METABOLIC PANEL
BUN: 9 mg/dL (ref 6–23)
CO2: 27 mEq/L (ref 19–32)
Calcium: 8.4 mg/dL (ref 8.4–10.5)
GFR: 106.94 mL/min (ref >60.00)
Glucose, Bld: 136 mg/dL — ABNORMAL HIGH (ref 70–99)
Potassium: 3.3 mEq/L — ABNORMAL LOW (ref 3.5–5.1)
Sodium: 133 mEq/L — ABNORMAL LOW (ref 135–145)

## 2012-11-01 LAB — HEPATIC FUNCTION PANEL: ALT: 34 U/L (ref 0–35)

## 2012-11-01 NOTE — Telephone Encounter (Signed)
Spoke with patient and told her I will check with Dr. Marina Goodell and see if she needs an OV prior to EGD or if he would like her to be a direct EGD. She understands he is not in the office until Wednesday. Please, advise.

## 2012-11-01 NOTE — Progress Notes (Signed)
Quick Note:  Celiac Ab's + Was admitted over weekend with profound anemia and hx of prior melena - none at hospital Plan for outpatient f/u w/ EGD per Dr. Marina Goodell - not dx celiac but to look for other causes of melena She indicated willingness to try gluten-free diet when at hospital, something she had not done in past  ______

## 2012-11-02 LAB — TYPE AND SCREEN
Unit division: 0
Unit division: 0

## 2012-11-02 NOTE — Telephone Encounter (Signed)
Add her on to my [facility] schedule this Thursday morning at 10:30 am. Thanks

## 2012-11-03 ENCOUNTER — Telehealth: Payer: Self-pay

## 2012-11-03 ENCOUNTER — Encounter: Payer: Self-pay | Admitting: Internal Medicine

## 2012-11-03 NOTE — Telephone Encounter (Signed)
Spoke with pt and reviewed with her that she needs to hold her diabetic med tomorrow. Pt also aware that she needs a driver for the procedure tomorrow.

## 2012-11-03 NOTE — Telephone Encounter (Signed)
See previous phone message. 

## 2012-11-03 NOTE — Telephone Encounter (Signed)
Pt scheduled for EGD in the [facility] 11/04/12@10 :30am. Pt aware of appt date and time. Pt also aware of prep. Paperwork faxed to pt to sign and fax back.

## 2012-11-04 ENCOUNTER — Other Ambulatory Visit: Payer: Self-pay | Admitting: Internal Medicine

## 2012-11-04 ENCOUNTER — Ambulatory Visit (AMBULATORY_SURGERY_CENTER): Payer: 59 | Admitting: Internal Medicine

## 2012-11-04 ENCOUNTER — Encounter: Payer: Self-pay | Admitting: Internal Medicine

## 2012-11-04 VITALS — BP 126/73 | HR 67 | Temp 97.3°F | Resp 28 | Ht 64.0 in | Wt 150.0 lb

## 2012-11-04 DIAGNOSIS — D649 Anemia, unspecified: Secondary | ICD-10-CM

## 2012-11-04 DIAGNOSIS — R195 Other fecal abnormalities: Secondary | ICD-10-CM

## 2012-11-04 DIAGNOSIS — K254 Chronic or unspecified gastric ulcer with hemorrhage: Secondary | ICD-10-CM

## 2012-11-04 DIAGNOSIS — K900 Celiac disease: Secondary | ICD-10-CM

## 2012-11-04 DIAGNOSIS — K5090 Crohn's disease, unspecified, without complications: Secondary | ICD-10-CM

## 2012-11-04 LAB — GLUCOSE, CAPILLARY

## 2012-11-04 MED ORDER — SODIUM CHLORIDE 0.9 % IV SOLN: 500.0000 mL | INTRAVENOUS | Status: DC

## 2012-11-04 NOTE — Op Note (Signed)
[facility] [address] [place], [address]   ENDOSCOPY PROCEDURE REPORT  PATIENT: Sabrina, [name]  MR#: [id number] BIRTHDATE: 03-17-1956 , 56  yrs. old GENDER: Female ENDOSCOPIST: Roxy Cedar, MD REFERRED BY:  .  [employer] PROCEDURE DATE:  11/04/2012 PROCEDURE:  EGD w/ biopsy ASA CLASS:     Class II INDICATIONS:  Melena. MEDICATIONS: MAC sedation, administered by CRNA and propofol (Diprivan) 200mg  IV TOPICAL ANESTHETIC: Cetacaine Spray  DESCRIPTION OF PROCEDURE: After the risks benefits and alternatives of the procedure were thoroughly explained, informed consent was obtained.  The LB [id number]-H180 [id number] endoscope was introduced through the mouth and advanced to the second portion of the duodenum. Without limitations.  The instrument was slowly withdrawn as the mucosa was fully examined.      The esophagus was normal.  The stomach revealed a 1.5 cm clean-based antral ulcer with friability but no stigmata.  The remainder of the stomach was normal.  CLO biopsy taken.  The duodenal bulb was normal.  Post bulbar duodenum revealed scalloping consistent with known celiac disease.  Retroflexed views revealed no abnormalities. The scope was then withdrawn from the patient and the procedure completed.  COMPLICATIONS: There were no complications. ENDOSCOPIC IMPRESSION: 1. Gastric ulcer. Source for recent GI bleed. Status post CLO biopsy 2. Celiac disease  RECOMMENDATIONS: 1.  Avoid NSAIDS (medicines like Advil, ibuprofen, Aleve, aspirin, etc.) 2.  Continue pantoprazole 40 mg twice daily until further notice 3.  Continue oral iron therapy daily 4. Strict gluten-free diet 3.  OP follow-up with Dr. Marina Goodell in 4 weeks.  REPEAT EXAM:  eSigned:  Roxy Cedar, MD 11/04/2012 11:25 AM [id number] Assunta Found, MD and The Patient

## 2012-11-04 NOTE — Progress Notes (Signed)
Patient did not have preoperative order for IV antibiotic SSI prophylaxis. (G8918)  Patient did not experience any of the following events: a burn prior to discharge; a fall within the facility; wrong site/side/patient/procedure/implant event; or a hospital transfer or hospital admission upon discharge from the facility. (G8907)  

## 2012-11-04 NOTE — Patient Instructions (Addendum)

## 2012-11-05 ENCOUNTER — Telehealth: Payer: Self-pay | Admitting: *Deleted

## 2012-11-05 NOTE — Telephone Encounter (Signed)
Name identifier, left message, follow-up 

## 2012-11-08 ENCOUNTER — Telehealth: Payer: Self-pay | Admitting: *Deleted

## 2012-11-08 ENCOUNTER — Encounter: Payer: Self-pay | Admitting: Family Medicine

## 2012-11-08 ENCOUNTER — Other Ambulatory Visit: Payer: Self-pay

## 2012-11-08 ENCOUNTER — Ambulatory Visit (INDEPENDENT_AMBULATORY_CARE_PROVIDER_SITE_OTHER): Payer: 59 | Admitting: Family Medicine

## 2012-11-08 VITALS — BP 100/60 | HR 70 | Temp 98.3°F | Ht 63.75 in | Wt 145.6 lb

## 2012-11-08 DIAGNOSIS — R011 Cardiac murmur, unspecified: Secondary | ICD-10-CM

## 2012-11-08 DIAGNOSIS — D709 Neutropenia, unspecified: Secondary | ICD-10-CM

## 2012-11-08 DIAGNOSIS — D649 Anemia, unspecified: Secondary | ICD-10-CM

## 2012-11-08 DIAGNOSIS — I10 Essential (primary) hypertension: Secondary | ICD-10-CM

## 2012-11-08 DIAGNOSIS — R195 Other fecal abnormalities: Secondary | ICD-10-CM

## 2012-11-08 DIAGNOSIS — Z1231 Encounter for screening mammogram for malignant neoplasm of breast: Secondary | ICD-10-CM

## 2012-11-08 LAB — CBC WITH DIFFERENTIAL/PLATELET
Basophils Absolute: 0 10*3/uL (ref 0.0–0.1)
Basophils Relative: 0.5 % (ref 0.0–3.0)
Lymphs Abs: 0.8 10*3/uL (ref 0.7–4.0)
MCHC: 32.2 g/dL (ref 30.0–36.0)
MCV: 71.6 fl — ABNORMAL LOW (ref 78.0–100.0)
Monocytes Relative: 5.7 % (ref 3.0–12.0)
Neutro Abs: 1.1 10*3/uL — ABNORMAL LOW (ref 1.4–7.7)
RDW: 23.6 % — ABNORMAL HIGH (ref 11.5–14.6)
WBC: 2.1 10*3/uL — ABNORMAL LOW (ref 4.5–10.5)

## 2012-11-08 MED ORDER — ONDANSETRON HCL 4 MG PO TABS: 4.0000 mg | ORAL_TABLET | Freq: Three times a day (TID) | ORAL | Status: DC | PRN

## 2012-11-08 MED ORDER — SUCRALFATE 1 G PO TABS: 1.0000 g | ORAL_TABLET | Freq: Four times a day (QID) | ORAL | Status: DC

## 2012-11-08 NOTE — Telephone Encounter (Signed)
Message copied by Verdie Shire on Mon Nov 08, 2012  2:16 PM ------      Message from: Sheliah Hatch      Created: Mon Nov 08, 2012  1:48 PM       Pt needs to schedule lab visit for CBC in 2 weeks (dx neutropenia)- pt aware via MyChart ------

## 2012-11-08 NOTE — Progress Notes (Signed)
  Subjective:    Patient ID: Sabrina [name], female    DOB: 07-Apr-1956, 57 y.o.   MRN: [id number]  HPI Hospital f/u- pt was extremely anemic at her visit at 5/2 w/ Hgb of 6.1.  Was transfused 3 units of blood while hospitalized.  Stopped Naproxen and HCTZ due to low BP.  Pt is no longer having ankle edema, reports feeling better.  Increased energy.  SOB has improved.  Had inpt endoscopy w/ biopsy- large gastric antral ulcer w/ bleeding.  Now on Protonix.  Has GI f/u on 6/5.  Pt's aortic stenosis is now moderate on ECHO compared to mild 2 yrs ago.  Hospitalist was recommending cards f/u.  Is having nausea w/ eating and diarrhea.  Taking 2 Immodium daily w/ some improvement.   Review of Systems For ROS see HPI     Objective:   Physical Exam  Vitals reviewed. Constitutional: She is oriented to person, place, and time. She appears well-developed and well-nourished. No distress.  HENT:  Head: Normocephalic and atraumatic.  Eyes: Conjunctivae and EOM are normal. Pupils are equal, round, and reactive to light.  Neck: Normal range of motion. Neck supple. No thyromegaly present.  Cardiovascular: Normal rate, regular rhythm and intact distal pulses.   Murmur (II-III/VI SEM) heard. Pulmonary/Chest: Effort normal and breath sounds normal. No respiratory distress.  Abdominal: Soft. She exhibits no distension. There is no tenderness.  Musculoskeletal: She exhibits no edema.  Lymphadenopathy:    She has no cervical adenopathy.  Neurological: She is alert and oriented to person, place, and time.  Skin: Skin is warm and dry.  Psychiatric: She has a normal mood and affect. Her behavior is normal.          Assessment & Plan:

## 2012-11-08 NOTE — Patient Instructions (Addendum)
We'll notify of your lab results and make any changes if needed We'll call you with your cardiology appt Start the Carafate 3x/day prior to eating.  Do this for 1-2 weeks and then see if you can eat w/out it. Use the Zofran as needed for the nausea Continue the immodium as needed for diarrhea Call with any questions or concerns Hang in there!

## 2012-11-08 NOTE — Assessment & Plan Note (Signed)
New to provider.  Pt w/ hx of similar.  sxs are greatly improved since receiving 3 units PRBCs.  Check f/u CBC.  Encouraged pt to start iron supplementation.  Will follow.

## 2012-11-08 NOTE — Assessment & Plan Note (Signed)
Pt had similar noted on ECHO in 2012 but based on most recent ECHO this has progressed.  Will refer to cardiology for complete evaluation and tx prn.  Pt expressed understanding and is in agreement w/ plan.

## 2012-11-08 NOTE — Assessment & Plan Note (Signed)
New.  Noted on EGD done during hospitalization.  Likely source of anemia.  Already on PPI.  Start sucralfate to help w/ pain and nausea and promote healing.  Will follow.

## 2012-11-08 NOTE — Assessment & Plan Note (Signed)
D/C'd HCTZ while in the hospital due to hypotension.  BP remains well controlled today.  No need to restart med.  Will continue to monitor.

## 2012-11-08 NOTE — Telephone Encounter (Signed)
Spoke with the pt and scheduled her an lab appt to have repeat CBC drawn in 2 weeks.  Lab entered and sent.//AB/CMA

## 2012-11-23 ENCOUNTER — Telehealth: Payer: Self-pay | Admitting: General Practice

## 2012-11-23 ENCOUNTER — Ambulatory Visit
Admission: RE | Admit: 2012-11-23 | Discharge: 2012-11-23 | Disposition: A | Discharge status: Home / Self Care | Payer: 59 | Source: Ambulatory Visit

## 2012-11-23 ENCOUNTER — Other Ambulatory Visit (INDEPENDENT_AMBULATORY_CARE_PROVIDER_SITE_OTHER): Payer: 59

## 2012-11-23 DIAGNOSIS — D709 Neutropenia, unspecified: Secondary | ICD-10-CM

## 2012-11-23 DIAGNOSIS — D61818 Other pancytopenia: Secondary | ICD-10-CM

## 2012-11-23 DIAGNOSIS — Z1231 Encounter for screening mammogram for malignant neoplasm of breast: Secondary | ICD-10-CM

## 2012-11-23 LAB — CBC WITH DIFFERENTIAL/PLATELET
Basophils Absolute: 0 10*3/uL (ref 0.0–0.1)
Eosinophils Absolute: 0.1 10*3/uL (ref 0.0–0.7)
HCT: 29.3 % — ABNORMAL LOW (ref 36.0–46.0)
Lymphs Abs: 0.6 10*3/uL — ABNORMAL LOW (ref 0.7–4.0)
MCHC: 32.2 g/dL (ref 30.0–36.0)
Monocytes Absolute: 0.1 10*3/uL (ref 0.1–1.0)
Monocytes Relative: 7.2 % (ref 3.0–12.0)
Neutro Abs: 1 10*3/uL — ABNORMAL LOW (ref 1.4–7.7)
Platelets: 132 10*3/uL — ABNORMAL LOW (ref 150.0–400.0)
RDW: 26.4 % — ABNORMAL HIGH (ref 11.5–14.6)

## 2012-11-23 NOTE — Telephone Encounter (Signed)
Called both of Pt's numbers listed cannot contact pt. Orders placed. Will keep trying to call pt.

## 2012-11-23 NOTE — Telephone Encounter (Signed)
Made in error

## 2012-11-23 NOTE — Telephone Encounter (Signed)
Pt scheduled for tomorrow for lab. States has not seen hematologist. Please advise if referral needs to be made.

## 2012-11-23 NOTE — Telephone Encounter (Signed)
Hope from [facility] lab called in a critical White Count of 1.9. Please advise.

## 2012-11-23 NOTE — Telephone Encounter (Signed)
This patient has pancytopenia in the setting of polypharmacy. All nonessential medications  should be discontinued. Copies of these labs should be sent to her psychiatrist as well as all other subspecialists who are seeing this patient. If she has not seen a hematologist; this should be scheduled.  The CBC and differential should be repeated 11/24/12.  She should go to the  [facility] urgent care if she is having any fever or signs of significant infection.

## 2012-11-24 ENCOUNTER — Other Ambulatory Visit (INDEPENDENT_AMBULATORY_CARE_PROVIDER_SITE_OTHER): Payer: 59

## 2012-11-24 DIAGNOSIS — D61818 Other pancytopenia: Secondary | ICD-10-CM

## 2012-11-24 LAB — CBC WITH DIFFERENTIAL/PLATELET
Basophils Absolute: 0 10*3/uL (ref 0.0–0.1)
Eosinophils Relative: 4.6 % (ref 0.0–5.0)
HCT: 29.9 % — ABNORMAL LOW (ref 36.0–46.0)
Lymphocytes Relative: 31.6 % (ref 12.0–46.0)
Monocytes Relative: 7.3 % (ref 3.0–12.0)
Neutrophils Relative %: 56 % (ref 43.0–77.0)
Platelets: 135 10*3/uL — ABNORMAL LOW (ref 150.0–400.0)
RDW: 26.1 % — ABNORMAL HIGH (ref 11.5–14.6)
WBC: 2 10*3/uL — ABNORMAL LOW (ref 4.5–10.5)

## 2012-11-24 NOTE — Telephone Encounter (Signed)
Referral ordered and sent.//AB/CMA 

## 2012-11-24 NOTE — Telephone Encounter (Signed)
Pt needs hematology referral (dx neutropenia)

## 2012-11-25 ENCOUNTER — Telehealth: Payer: Self-pay | Admitting: Internal Medicine

## 2012-11-25 MED ORDER — ONDANSETRON HCL 4 MG PO TABS: 4.0000 mg | ORAL_TABLET | Freq: Three times a day (TID) | ORAL | Status: DC | PRN

## 2012-11-25 NOTE — Telephone Encounter (Signed)
Refilled Protonix.  Patient has appointment in a week

## 2012-11-26 ENCOUNTER — Telehealth: Payer: Self-pay | Admitting: Hematology & Oncology

## 2012-11-26 NOTE — Telephone Encounter (Signed)
Pt aware of 6-30 appointment °

## 2012-11-29 ENCOUNTER — Encounter: Payer: Self-pay | Admitting: Family Medicine

## 2012-11-30 NOTE — Telephone Encounter (Signed)
Please  Advise.//AB/CMA

## 2012-12-02 ENCOUNTER — Ambulatory Visit (INDEPENDENT_AMBULATORY_CARE_PROVIDER_SITE_OTHER): Payer: 59 | Admitting: Internal Medicine

## 2012-12-02 ENCOUNTER — Encounter: Payer: Self-pay | Admitting: Internal Medicine

## 2012-12-02 VITALS — BP 100/70 | HR 72 | Ht 64.0 in | Wt 147.1 lb

## 2012-12-02 DIAGNOSIS — K253 Acute gastric ulcer without hemorrhage or perforation: Secondary | ICD-10-CM

## 2012-12-02 DIAGNOSIS — K900 Celiac disease: Secondary | ICD-10-CM

## 2012-12-02 DIAGNOSIS — K5010 Crohn's disease of large intestine without complications: Secondary | ICD-10-CM

## 2012-12-02 DIAGNOSIS — D509 Iron deficiency anemia, unspecified: Secondary | ICD-10-CM

## 2012-12-02 DIAGNOSIS — K50119 Crohn's disease of large intestine with unspecified complications: Secondary | ICD-10-CM

## 2012-12-02 MED ORDER — ESOMEPRAZOLE MAGNESIUM 40 MG PO CPDR: 40.0000 mg | DELAYED_RELEASE_CAPSULE | Freq: Every day | ORAL | Status: DC

## 2012-12-02 MED ORDER — OMEPRAZOLE 40 MG PO CPDR: 40.0000 mg | DELAYED_RELEASE_CAPSULE | Freq: Two times a day (BID) | ORAL | Status: DC

## 2012-12-02 NOTE — Progress Notes (Signed)
HISTORY OF PRESENT ILLNESS:  Sabrina [name] is a 57 y.o. female mild Crohn's colitis, hyperlipidemia, diabetes mellitus, anxiety/depression, hypertension, osteoarthritis, and celiac sprue. For her mild Crohn's colitis she has been maintained on Lialda. Her recent history is that of profound and symptomatic iron deficiency anemia as well as melena. Her hemoglobin was as low as 6.1. Other complaints have included diarrhea of 3 months duration. Patient has not been compliant with her gluten-free diet for some time. On 11/04/2012 she underwent upper endoscopy. She was found to have an antral ulcer. This was secondary to NSAIDs. Negative CLO testing. She was instructed to avoid NSAIDs and continue on pantoprazole 40 mg 3 times a day. She has. She has also been compliant with a gluten-free diet over the past month. She presents today for followup. Complaints include diarrhea, minor rectal bleeding and mid abdominal discomfort. No other GI symptoms. She did undergo surveillance colonoscopy January 2014. The colon and ileum were normal. Followup in 5 years recommended. She is on iron replacement. Her last hemoglobin on May 27 was 9.4.  REVIEW OF SYSTEMS:  All non-GI ROS negative except for sinus and allergy trouble, anxiety, depression, arthritis, cough, heart murmur  Past Medical History  Diagnosis Date  . Arthritis   . Allergic rhinitis   . Colitis   . Degenerative disc disease   . Hyperlipidemia   . Hypertension   . Celiac disease   . Bipolar disorder   . Macular degeneration   . Interstitial cystitis   . Diabetes mellitus without complication   . Anemia, iron deficiency 10/30/2012    Past Surgical History  Procedure Laterality Date  . Tubal ligation    . Appendectomy    . Lumbar laminectomy    . Carpal tunnel release      bilateral  . Cesarean section    . Cysto/ hod/ instillatio clorpactin  07-03-2008  &  06-09-2001    INTERSTITIAL CYSTITIS  . Shoulder arthroscopy w/ rotator cuff  repair  01-25-2008    LEFT SHOULDER /   INCLUDING CAPSULECTOMY WITH DEBRIDEMENT/  SAD WITH RESECTION OF AC  . Right shoulder arthroscopy/ open resection distal clavicle/ debridement adhesive capsulitis/ rotator cuff repair  08-30-2003  . Tonsillectomy and adenoidectomy    . Transthoracic echocardiogram  03-28-2011    NORMAL LVSF/ EF 60-65%/ VERY MILD AORTIC STENOSIS, TRIVIAL REGURG./ MILDLY DILATED LEFT ATRIUM  . Cystoscopy  11/26/2011    Procedure: CYSTOSCOPY;  Surgeon: Valetta Fuller, MD;  Location: [facility];  Service: Urology;  Laterality: N/A;  clorpactin  in bladder    Social History Sabrina [name]  reports that she has never smoked. She has never used smokeless tobacco. She reports that she does not drink alcohol or use illicit drugs.  family history includes Diabetes in her brother and mother; Hyperlipidemia in her brother; and Hypertension in her brother, mother, and unspecified family member.  There is no history of Colon cancer.  Allergies  Allergen Reactions  . Sulfonamide Derivatives     Classic angioedema reaction  . Wheat Bran     Celiac's disease       PHYSICAL EXAMINATION: Vital signs: BP 100/70  Pulse 72  Ht 5\' 4"  (1.626 m)  Wt 147 lb 2 oz (66.735 kg)  BMI 25.24 kg/m2 General: Well-developed, well-nourished, no acute distress HEENT: Sclerae are anicteric, conjunctiva pink. Oral mucosa intact Lungs: Clear Heart: Regular Abdomen: soft, nontender, nondistended, no obvious ascites, no peritoneal signs, normal bowel sounds. No organomegaly. Extremities:  No edema Psychiatric: alert and oriented x3. Cooperative   ASSESSMENT:  #1. Iron deficiency anemia. Likely due to a combination of iron malabsorption from celiac disease and GI bleeding from NSAID-induced ulcer disease. #2. Celiac disease. Noncompliance with gluten free diet. Now gluten-free for one month #3. Gastric ulcer #4. Probable mild Crohn's colitis. Last colonoscopy January 2014  normal.  PLAN:  #1. Continue twice a day PPI. Nexium samples provided and omeprazole 40 mg twice a day prescription submitted #2. Continue to avoid NSAIDs. #3. Tylenol as needed for arthritic pain #4. Continue iron supplement #5. Strict compliance with gluten-free diet #6. Relook endoscopy in about 6 weeks to ensure ulcer healing #7. Routine followup colonoscopy around 2019

## 2012-12-02 NOTE — Patient Instructions (Addendum)
You have been scheduled for an endoscopy with propofol. Please follow written instructions given to you at your visit today.  If you use inhalers (even only as needed), please bring them with you on the day of your procedure.  Your physician has requested that you go to www.startemmi.com and enter the access code given to you at your visit today. This web site gives a general overview about your procedure. However, you should still follow specific instructions given to you by our office regarding your preparation for the procedure.  We have sent the following medications to your pharmacy for you to pick up at your convenience:  Omeprazole  You have been given some samples of Nexium - take it twice a day until its gone  Continue your gluten free diet  Avoid anti-inflammatories.  Tylenol is ok to take for arthritis pain

## 2012-12-08 ENCOUNTER — Telehealth: Payer: Self-pay

## 2012-12-08 NOTE — Telephone Encounter (Signed)
Called to initiate prior authorization.  Representative is faxing form to be filled out

## 2012-12-09 ENCOUNTER — Telehealth: Payer: Self-pay

## 2012-12-09 ENCOUNTER — Encounter: Payer: Self-pay | Admitting: Family Medicine

## 2012-12-09 ENCOUNTER — Encounter: Payer: Self-pay | Admitting: Internal Medicine

## 2012-12-09 DIAGNOSIS — R2689 Other abnormalities of gait and mobility: Secondary | ICD-10-CM

## 2012-12-09 NOTE — Telephone Encounter (Signed)
After speaking with prior authorization representative, received P.A. fax to fill out.  Completed form and faxed back to [employer] [facility].  Awaiting reply.

## 2012-12-10 NOTE — Telephone Encounter (Signed)
Please advise.//AB/CMA 

## 2012-12-16 ENCOUNTER — Encounter: Payer: Self-pay | Admitting: Family Medicine

## 2012-12-23 ENCOUNTER — Ambulatory Visit (INDEPENDENT_AMBULATORY_CARE_PROVIDER_SITE_OTHER): Payer: 59 | Admitting: Cardiovascular Disease

## 2012-12-23 ENCOUNTER — Encounter: Payer: Self-pay | Admitting: Cardiovascular Disease

## 2012-12-23 VITALS — BP 124/70 | HR 72 | Wt 145.0 lb

## 2012-12-23 DIAGNOSIS — I10 Essential (primary) hypertension: Secondary | ICD-10-CM

## 2012-12-23 DIAGNOSIS — D649 Anemia, unspecified: Secondary | ICD-10-CM

## 2012-12-23 DIAGNOSIS — I359 Nonrheumatic aortic valve disorder, unspecified: Secondary | ICD-10-CM

## 2012-12-23 DIAGNOSIS — E785 Hyperlipidemia, unspecified: Secondary | ICD-10-CM

## 2012-12-23 DIAGNOSIS — I350 Nonrheumatic aortic (valve) stenosis: Secondary | ICD-10-CM

## 2012-12-23 NOTE — Assessment & Plan Note (Signed)
Cholesterol is at goal.  Continue current dose of statin and diet Rx.  No myalgias or side effects.  F/U  LFT's in 6 months. No results found for this basename: LDLCALC             

## 2012-12-23 NOTE — Patient Instructions (Signed)
Your physician wants you to follow-up in: 6 MONTHS WITH DR Eden Emms WITH ECHO You will receive a reminder letter in the mail two months in advance. If you don't receive a letter, please call our office to schedule the follow-up appointment. Your physician recommends that you continue on your current medications as directed. Please refer to the Current Medication list given to you today.  Your physician has requested that you have an echocardiogram. Echocardiography is a painless test that uses sound waves to create images of your heart. It provides your doctor with information about the size and shape of your heart and how well your heart's chambers and valves are working. This procedure takes approximately one hour. There are no restrictions for this procedure. IN 6 MONTHS

## 2012-12-23 NOTE — Assessment & Plan Note (Signed)
Well controlled.  Continue current medications and low sodium Dash type diet.    

## 2012-12-23 NOTE — Assessment & Plan Note (Signed)
F/U GI Murmur will be louder when Hb drops  ? R/o AVM;s with anemia in setting of AS

## 2012-12-23 NOTE — Progress Notes (Signed)
Patient ID: Sabrina [name], female   DOB: 26-Feb-1956, 57 y.o.   MRN: [id number] 57 yo with celiac disesse, Chrones and anemia Referred for murmur Echo done 5/14 reviewed  Study Conclusions  - Left ventricle: The cavity size was normal. Wall thickness was normal. Systolic function was normal. The estimated ejection fraction was in the range of 55% to 60%. Wall motion was normal; there were no regional wall motion abnormalities. - Aortic valve: Moderate thickening and calcification, consistent with sclerosis. Valve area: 1.88cm^2(VTI). Valve area: 1.96cm^2 (Vmax). - Left atrium: The atrium was mildly dilated. - Right atrium: The atrium was mildly dilated. - Atrial septum: No defect or patent foramen ovale was identified. - Pulmonary arteries: PA peak pressure: 33mm Hg (S).  Gradients 2012  5/11 mean and max and nor 11/19 mmHg  Compared to 9/12 valve more calcified but no more stenosis or gardient She has had a murmur for a long time No cardiac symptoms Can be dyspnic When anemic and was hospitalized in may with Hb 6 with transfusion  ROS: Denies fever, malais, weight loss, blurry vision, decreased visual acuity, cough, sputum, SOB, hemoptysis, pleuritic pain, palpitaitons, heartburn, abdominal pain, melena, lower extremity edema, claudication, or rash.  All other systems reviewed and negative   General: Affect appropriate Healthy:  appears stated age HEENT: normal Neck supple with no adenopathy JVP normal no bruits no thyromegaly Lungs clear with no wheezing and good diaphragmatic motion Heart:  S1/S2 preseerved mild AS  murmur,rub, gallop or click PMI normal Abdomen: benighn, BS positve, no tenderness, no AAA no bruit.  No HSM or HJR Distal pulses intact with no bruits No edema Neuro non-focal Skin warm and dry No muscular weakness  Medications Current Outpatient Prescriptions  Medication Sig Dispense Refill  . ARIPiprazole (ABILIFY) 10 MG tablet Take 10 mg by mouth  daily.       . fexofenadine (ALLEGRA) 180 MG tablet Take 180 mg by mouth daily.        Marland Kitchen FLUoxetine (PROZAC) 40 MG capsule Take 40 mg by mouth daily.        Marland Kitchen LORazepam (ATIVAN) 0.5 MG tablet Take 0.5 mg by mouth 2 (two) times daily.       . mesalamine (LIALDA) 1.2 G EC tablet Take 1,200 mg by mouth 2 (two) times daily.      . metFORMIN (GLUCOPHAGE) 500 MG tablet Take 1 tablet (500 mg total) by mouth 2 (two) times daily with a meal.  180 tablet  3  . Multiple Vitamin (MULTIVITAMIN) tablet Take 1 tablet by mouth daily.        Marland Kitchen omeprazole (PRILOSEC) 40 MG capsule Take 1 capsule (40 mg total) by mouth 2 (two) times daily.  60 capsule  6  . pentosan polysulfate (ELMIRON) 100 MG capsule Take 200 mg by mouth 2 (two) times daily.       . traZODone (DESYREL) 50 MG tablet Take 25 mg by mouth at bedtime.        . Vitamin D, Ergocalciferol, (DRISDOL) 50000 UNITS CAPS Take 50,000 Units by mouth every 7 (seven) days. Friday       No current facility-administered medications for this visit.    Allergies Sulfonamide derivatives and Wheat bran  Family History: Family History  Problem Relation Age of Onset  . Diabetes Mother   . Hypertension Mother   . Diabetes Brother   . Hypertension Brother   . Hyperlipidemia Brother   . Hypertension    . Colon cancer Neg Hx  Social History: History   Social History  . Marital Status: Married    Spouse Name: N/A    Number of Children: N/A  . Years of Education: N/A   Occupational History  . Not on file.   Social History Main Topics  . Smoking status: Never Smoker   . Smokeless tobacco: Never Used  . Alcohol Use: No  . Drug Use: No  . Sexually Active: Not on file   Other Topics Concern  . Not on file   Social History Narrative   Daughter lives in [place], [place]    Electrocardiogram:  11/01/12  SR rate 82 nonspecific ST/T wave changes   Assessment and Plan

## 2012-12-23 NOTE — Assessment & Plan Note (Signed)
Asymptomatic and mild  Little change over the last 2 years.  F/U echo 6 months No need for SBE prophylaxis

## 2012-12-27 ENCOUNTER — Telehealth: Payer: Self-pay | Admitting: Hematology & Oncology

## 2012-12-27 ENCOUNTER — Ambulatory Visit: Payer: 59

## 2012-12-27 ENCOUNTER — Ambulatory Visit (HOSPITAL_BASED_OUTPATIENT_CLINIC_OR_DEPARTMENT_OTHER): Payer: 59 | Admitting: Hematology & Oncology

## 2012-12-27 ENCOUNTER — Other Ambulatory Visit (HOSPITAL_BASED_OUTPATIENT_CLINIC_OR_DEPARTMENT_OTHER): Payer: 59 | Admitting: Lab

## 2012-12-27 VITALS — BP 97/52 | HR 72 | Temp 97.8°F | Resp 16 | Ht 64.0 in | Wt 142.0 lb

## 2012-12-27 DIAGNOSIS — D61818 Other pancytopenia: Secondary | ICD-10-CM

## 2012-12-27 DIAGNOSIS — D509 Iron deficiency anemia, unspecified: Secondary | ICD-10-CM

## 2012-12-27 DIAGNOSIS — K900 Celiac disease: Secondary | ICD-10-CM

## 2012-12-27 DIAGNOSIS — K5010 Crohn's disease of large intestine without complications: Secondary | ICD-10-CM

## 2012-12-27 LAB — CBC WITH DIFFERENTIAL (CANCER CENTER ONLY)
Eosinophils Absolute: 0.1 10*3/uL (ref 0.0–0.5)
HCT: 32 % — ABNORMAL LOW (ref 34.8–46.6)
LYMPH%: 40 % (ref 14.0–48.0)
MCH: 24.9 pg — ABNORMAL LOW (ref 26.0–34.0)
MCV: 81 fL (ref 81–101)
MONO#: 0.1 10*3/uL (ref 0.1–0.9)
MONO%: 5.9 % (ref 0.0–13.0)
NEUT%: 48.7 % (ref 39.6–80.0)
RBC: 3.94 10*6/uL (ref 3.70–5.32)
WBC: 1.9 10*3/uL — ABNORMAL LOW (ref 3.9–10.0)

## 2012-12-27 LAB — CHCC SATELLITE - SMEAR

## 2012-12-27 NOTE — Progress Notes (Signed)
This office note has been dictated. Was

## 2012-12-27 NOTE — Telephone Encounter (Signed)
Called and spoke with Rinaldo Cloud at [facility] and sch Ct apt for patient on 12/30/12.  Patient is aware of apt date/time, location, and how to drink contrast.

## 2012-12-28 ENCOUNTER — Telehealth: Payer: Self-pay | Admitting: Hematology & Oncology

## 2012-12-28 HISTORY — PX: CHOLECYSTECTOMY: SHX55

## 2012-12-28 NOTE — Addendum Note (Signed)
Addended by: Arlan Organ R on: 12/28/2012 02:08 PM   Modules accepted: Orders

## 2012-12-28 NOTE — Telephone Encounter (Signed)
Per Amy to sch iron apt for patient.  Patient sch apt for 01/12/13

## 2012-12-29 LAB — COMPREHENSIVE METABOLIC PANEL
AST: 60 U/L — ABNORMAL HIGH (ref 0–37)
BUN: 7 mg/dL (ref 6–23)
Calcium: 8.8 mg/dL (ref 8.4–10.5)
Chloride: 104 mEq/L (ref 96–112)
Creatinine, Ser: 0.54 mg/dL (ref 0.50–1.10)
Glucose, Bld: 131 mg/dL — ABNORMAL HIGH (ref 70–99)

## 2012-12-29 LAB — RETICULOCYTES (CHCC)
ABS Retic: 36.3 10*3/uL (ref 19.0–186.0)
RBC.: 4.03 MIL/uL (ref 3.87–5.11)
Retic Ct Pct: 0.9 % (ref 0.4–2.3)

## 2012-12-29 LAB — PROTEIN ELECTROPHORESIS, SERUM, WITH REFLEX
Beta Globulin: 7.1 % (ref 4.7–7.2)
Gamma Globulin: 36.7 % — ABNORMAL HIGH (ref 11.1–18.8)
Total Protein, Serum Electrophoresis: 7.8 g/dL (ref 6.0–8.3)

## 2012-12-29 LAB — VITAMIN B12: Vitamin B-12: 1151 pg/mL — ABNORMAL HIGH (ref 211–911)

## 2012-12-29 LAB — IRON AND TIBC: %SAT: 14 % — ABNORMAL LOW (ref 20–55)

## 2012-12-29 NOTE — Progress Notes (Signed)
CC:   Sabrina [name]. Sabrina Goodell, MD  DIAGNOSES: 1. Pancytopenia. 2. Crohn disease. 3. Celiac disease.  HISTORY OF PRESENT ILLNESS:  Sabrina [name] is a very charming 57 year old white female.  She has a history of Crohn disease and celiac disease. She has never had surgery for the Crohn disease.  She is, I think, followed by Sabrina [name].  She recently was found to have I think a bleeding ulcer.  This was I think back in April.  She, I think, was transfused.  She did not get any IV iron while in the hospital.  She did get 2 units of blood.  She was scoped.  Again, she had the bleeding ulcer.  Again, she also has a diagnosis of celiac disease.  She takes a gluten free diet for this.  She has been noted to have problems with her blood counts.  She is followed by Sabrina [name].  Sabrina [name] has noticed that her blood counts have been on the [name] side.  Back on May 12th, her white cell count was 3.1, hemoglobin 9.4, hematocrit 29.3, platelet count 130.  MCV was 72.  On May 28th, her white cell count was 2, hemoglobin 9.4, hematocrit 29.9, platelet count was 135.  She has had no bleeding outside of that associated with this gastric ulcer.  She has lost weight.  I think she has probably lost about 15 or 20 pounds.  Her appetite is down.  The patient has occasional abdominal pain.  There have been no rashes.  There has been no leg swelling.  She has had an occasional [name]-grade fever.  There is no double vision or blurred vision.  She has had no dysphagia or odynophagia.  Again, she is kindly referred over to the [facility] for an evaluation.  Her current performance status is ECOG 1.  PAST MEDICAL HISTORY: 1. Celiac disease. 2. Crohn disease. 3. Aortic stenosis.  Echocardiogram done in May showed a good ejection     fraction of 55% to 60%.  She had moderate aortic stenosis.  She had     a valve area of 1.9 cm2. 4. Hypertension. 5. Diabetes. 6. Situational  depression.  ALLERGIES: 1. Sulfa. 2. Wheat-based products.  MEDICATIONS: 1. Abilify 10 mg p.o. daily. 2. Allegra 180 mg p.o. daily. 3. Prozac 40 mg p.o. daily. 4. Ativan 0.5 mg p.o. b.i.d. 5. Glucophage 1000 mg p.o. daily and 500 mg with meals. 6. Prilosec 40 mg p.o. b.i.d. 7. Elmiron 200 mg p.o. b.i.d. 8. Desyrel 25 mg p.o. q.h.s. 9. Mesalamine 1.2 g p.o. b.i.d.  SOCIAL HISTORY:  Remarkable for remote tobacco use.  There is really no alcohol use.  She has no obvious occupational exposures.  FAMILY HISTORY:  Noncontributory.  PHYSICAL EXAMINATION:  General:  This is a fairly well-developed, well- nourished white female in no obvious distress.  Vital signs: Temperature 97.8, pulse 72, respiratory rate 16, blood pressure 97/52. Weight is 142.  Head and neck:  Normocephalic, atraumatic skull.  There are no ocular or oral lesions.  There are no palpable cervical or supraclavicular lymph nodes.  She has no scleral icterus.  There is no palpable thyroid.  Lungs:  Clear bilaterally.  There are no rales, wheezes, or rhonchi.  Cardiac:  Regular rate and rhythm with a normal S1, S2.  There are no murmurs, rubs or bruits.  Abdomen:  Soft with good bowel sounds.  There is no palpable abdominal mass.  There is no fluid wave.  There is no guarding or rebound tenderness.  There is no palpable hepatosplenomegaly.  Back:  No kyphosis.  There is no tenderness over the spine, ribs, or hips.  Extremities:  No clubbing, cyanosis or edema. She has good range motion of her joints.  No joint swelling is noted. Skin:  No rashes, ecchymosis, or petechia.  Neurologic:  No focal neurological deficits.  LABORATORY STUDIES:  White cell count 1.9, hemoglobin 9.8, hematocrit 32, platelet count 96,000.  MCV is 81.  Ferritin is 16 with an iron saturation of 14%.  Vitamin B12 is 1151.  Her TSH is 1.72.  Erythropoietin level is 23.  BUN 7, creatinine 0.54.  LFTs are mildly elevated.  Total protein  7.8 with an albumin of 3.0.  Her IgG level is 2680 mg/dL. IgA 162 mg/dL and IgM 33 mg/dL.  Peripheral smear shows microcytic hyperchromic population of red blood cells.  There is no target cells.  I see no teardrop cells.  There is no nucleated red blood cells.  There is no rouleaux formation.  White cells are decreased in number.  She does have mature appearing lymphocytes. There is no hypersegmented polys.  I do not see any atypical lymphocytes.  There is no blasts.  Platelets are decreased in number. She has some large platelets.  IMPRESSION:  Sabrina [name] is a very charming 57 year old white female with several issues.  She is clearly iron deficient.  She is not absorbing iron, which is reflective of the celiac and the Crohn disease.  I thought she might have vitamin B12 deficiency.  Her B12 levels were okay.  It is hard to explain the [name] white cells and [name] platelets.  I do think that she needs a CT scan of the abdomen and pelvis.  With history of Crohn she is at risk for adenocarcinoma of the GI tract. With the history of celiac disease, she is at risk for gastrointestinal lymphoma.  This would really help [place] out.  I would think that she would be a little young for myelodysplasia. However, this is a possibility that we may need to consider.  I do not think that we are looking at anything like cirrhosis.  I do not believe she has HIV.  I have not tested for these, but we may need to consider this if our other workup studies are normal.  Ultimately, she may need to have a bone marrow biopsy done.  I really would hate to put her through one unless I really feel that the results would  help [place] out.  We will get her in for IV iron.  I do not think she needs vitamin B12 given her current B12 level.  We will see what her CAT scans show.  I would like to get her back in about a month or so that we can follow up with her lab work and see if any changes have been noted with the  IV iron.  Her erythropoietin level is on the [name] side.  I just wonder if she may have some erythropoietin deficiency, although her kidney functions are normal.  I suppose that she may need to have a trial of ESA if we cannot improve her anemia with iron nor find anything on her bone marrow biopsy.  I spent a good hour to hour and a half with Sabrina [name].  She is very very nice.  I did give her a nice prayer blanket which she very much appreciated.    ______________________________ Theron Arista  Tacy Dura, M.D. PRE/MEDQ  D:  12/28/2012  T:  12/29/2012  Job:  [id number]

## 2012-12-30 ENCOUNTER — Other Ambulatory Visit: Payer: Self-pay | Admitting: Hematology & Oncology

## 2012-12-30 ENCOUNTER — Ambulatory Visit
Admission: RE | Admit: 2012-12-30 | Discharge: 2012-12-30 | Disposition: A | Discharge status: Home / Self Care | Payer: 59 | Source: Ambulatory Visit | Attending: Hematology & Oncology | Admitting: Hematology & Oncology

## 2012-12-30 DIAGNOSIS — K5010 Crohn's disease of large intestine without complications: Secondary | ICD-10-CM

## 2012-12-30 DIAGNOSIS — K900 Celiac disease: Secondary | ICD-10-CM

## 2012-12-30 DIAGNOSIS — D61818 Other pancytopenia: Secondary | ICD-10-CM

## 2012-12-30 DIAGNOSIS — D509 Iron deficiency anemia, unspecified: Secondary | ICD-10-CM

## 2012-12-30 MED ORDER — IOHEXOL 300 MG/ML  SOLN
100.0000 mL | Freq: Once | INTRAMUSCULAR | Status: AC | PRN
  Administered 2012-12-30: 100 mL via INTRAVENOUS

## 2012-12-30 NOTE — Progress Notes (Signed)
I spoke with Ms. woolworth today by phone.  Her CT scan showed marked splenomegaly. As such, I think she's going to need a bone marrow biopsy done. I explained to her why.  The rest of her lab work looks okay outside of iron deficiency. There's nothing in the CT scan that looked like there is an obvious colonic or intestinal malignancy. She will be set up for IV iron.  I put the order in for radiology to do a bone marrow biopsy on her next week.  She understands this and agrees have the bone marrow biopsy done.  Cindee Lame

## 2013-01-06 ENCOUNTER — Other Ambulatory Visit: Payer: Self-pay | Admitting: Radiology

## 2013-01-06 ENCOUNTER — Encounter (HOSPITAL_COMMUNITY): Payer: Self-pay | Admitting: Pharmacy Technician

## 2013-01-09 ENCOUNTER — Encounter: Payer: Self-pay | Admitting: Hematology & Oncology

## 2013-01-10 ENCOUNTER — Ambulatory Visit (AMBULATORY_SURGERY_CENTER): Payer: 59 | Admitting: Internal Medicine

## 2013-01-10 ENCOUNTER — Encounter: Payer: Self-pay | Admitting: Internal Medicine

## 2013-01-10 ENCOUNTER — Other Ambulatory Visit: Payer: Self-pay | Admitting: Radiology

## 2013-01-10 VITALS — BP 103/62 | HR 66 | Temp 98.6°F | Resp 18 | Ht 64.0 in | Wt 147.0 lb

## 2013-01-10 DIAGNOSIS — K253 Acute gastric ulcer without hemorrhage or perforation: Secondary | ICD-10-CM

## 2013-01-10 DIAGNOSIS — K2960 Other gastritis without bleeding: Secondary | ICD-10-CM

## 2013-01-10 MED ORDER — SODIUM CHLORIDE 0.9 % IV SOLN: 500.0000 mL | INTRAVENOUS | Status: DC

## 2013-01-10 NOTE — Progress Notes (Signed)
Called to room to assist during endoscopic procedure.  Patient ID and intended procedure confirmed with present staff. Received instructions for my participation in the procedure from the performing physician.  

## 2013-01-10 NOTE — Patient Instructions (Addendum)
YOU HAD AN ENDOSCOPIC PROCEDURE TODAY AT THE [facility]: Refer to the procedure report that was given to you for any specific questions about what was found during the examination.  If the procedure report does not answer your questions, please call your gastroenterologist to clarify.  If you requested that your care partner not be given the details of your procedure findings, then the procedure report has been included in a sealed envelope for you to review at your convenience later.  YOU SHOULD EXPECT: Some feelings of bloating in the abdomen. Passage of more gas than usual.  Walking can help get rid of the air that was put into your GI tract during the procedure and reduce the bloating.  DIET: Your first meal following the procedure should be a light meal and then it is ok to progress to your normal diet.  A half-sandwich or bowl of soup is an example of a good first meal.  Heavy or fried foods are harder to digest and may make you feel nauseous or bloated.  Likewise meals heavy in dairy and vegetables can cause extra gas to form and this can also increase the bloating.  Drink plenty of fluids but you should avoid alcoholic beverages for 24 hours.  ACTIVITY: Your care partner should take you home directly after the procedure.  You should plan to take it easy, moving slowly for the rest of the day.  You can resume normal activity the day after the procedure however you should NOT DRIVE or use heavy machinery for 24 hours (because of the sedation medicines used during the test).    SYMPTOMS TO REPORT IMMEDIATELY: A gastroenterologist can be reached at any hour.  During normal business hours, 8:30 AM to 5:00 PM Monday through Friday, call [phone].  After hours and on weekends, please call the GI answering service at [phone] who will take a message and have the physician on call contact you.   Following upper endoscopy (EGD)  Vomiting of blood or coffee ground material  New  chest pain or pain under the shoulder blades  Painful or persistently difficult swallowing  New shortness of breath  Fever of 100F or higher  Black, tarry-looking stools  FOLLOW UP: If any biopsies were taken you will be contacted by phone or by letter within the next 1-3 weeks.  Call your gastroenterologist if you have not heard about the biopsies in 3 weeks.  Our staff will call the home number listed on your records the next business day following your procedure to check on you and address any questions or concerns that you may have at that time regarding the information given to you following your procedure. This is a courtesy call and so if there is no answer at the home number and we have not heard from you through the emergency physician on call, we will assume that you have returned to your regular daily activities without incident.  SIGNATURES/CONFIDENTIALITY: You and/or your care partner have signed paperwork which will be entered into your electronic medical record.  These signatures attest to the fact that that the information above on your After Visit Summary has been reviewed and is understood.  Full responsibility of the confidentiality of this discharge information lies with you and/or your care-partner.  Await biopsy results  Continue your normal medications  Avoid NSAIDS (anti-inflammatories) as you have been

## 2013-01-10 NOTE — Op Note (Signed)
[facility] [address] [place], [address]   ENDOSCOPY PROCEDURE REPORT  PATIENT: Sabrina, [name]  MR#: [id number] BIRTHDATE: Jul 31, 1955 , 56  yrs. old GENDER: Female ENDOSCOPIST: Roxy Cedar, MD REFERRED BY:  .  Rcall/  Office PROCEDURE DATE:  01/10/2013 PROCEDURE:  EGD w/ biopsy ASA CLASS:     Class II INDICATIONS:  ulcer follow up. MEDICATIONS: MAC sedation, administered by CRNA and propofol (Diprivan) 300mg  IV TOPICAL ANESTHETIC: Cetacaine Spray  DESCRIPTION OF PROCEDURE: After the risks benefits and alternatives of the procedure were thoroughly explained, informed consent was obtained.  The LB [id number] endoscope was introduced through the mouth and advanced to the second portion of the duodenum. Without limitations.  The instrument was slowly withdrawn as the mucosa was fully examined.      EXAM: Normal esophagus.  Antral ulcer with subtantial healing, but still fleshy and friable (SEE PHOTO).  Multiple bx taken. Remainder of stomach normal.  normal duodenum.  Retroflexed views revealed no abnormalities.     The scope was then withdrawn from the patient and the procedure completed.  COMPLICATIONS: There were no complications. ENDOSCOPIC IMPRESSION: 1. Improved appearrance of ulcer, but still with residual ulceration   RECOMMENDATIONS: 1.  Await biopsy results 2.  Continue current medications 3.  Avoid NSAIDS (anti-inflammatory medicines) as you have been 4.  Repeat Upper Endoscopy in 8 weeks to reassess ulcer  REPEAT EXAM:  eSigned:  Roxy Cedar, MD 01/10/2013 1:58 PM   [id number] Assunta Found, MD and The Patient

## 2013-01-10 NOTE — Progress Notes (Signed)
Patient did not experience any of the following events: a burn prior to discharge; a fall within the facility; wrong site/side/patient/procedure/implant event; or a hospital transfer or hospital admission upon discharge from the facility. (G8907) Patient did not have preoperative order for IV antibiotic SSI prophylaxis. (G8918)  

## 2013-01-11 ENCOUNTER — Encounter (HOSPITAL_COMMUNITY): Payer: Self-pay

## 2013-01-11 ENCOUNTER — Telehealth: Payer: Self-pay | Admitting: *Deleted

## 2013-01-11 ENCOUNTER — Ambulatory Visit (HOSPITAL_COMMUNITY)
Admission: RE | Admit: 2013-01-11 | Discharge: 2013-01-11 | Disposition: A | Discharge status: Home / Self Care | Payer: 59 | Source: Ambulatory Visit | Attending: Hematology & Oncology | Admitting: Hematology & Oncology

## 2013-01-11 DIAGNOSIS — K5090 Crohn's disease, unspecified, without complications: Secondary | ICD-10-CM | POA: Insufficient documentation

## 2013-01-11 DIAGNOSIS — K2940 Chronic atrophic gastritis without bleeding: Secondary | ICD-10-CM | POA: Insufficient documentation

## 2013-01-11 DIAGNOSIS — E119 Type 2 diabetes mellitus without complications: Secondary | ICD-10-CM | POA: Insufficient documentation

## 2013-01-11 DIAGNOSIS — R161 Splenomegaly, not elsewhere classified: Secondary | ICD-10-CM | POA: Insufficient documentation

## 2013-01-11 DIAGNOSIS — D61818 Other pancytopenia: Secondary | ICD-10-CM | POA: Insufficient documentation

## 2013-01-11 DIAGNOSIS — Z79899 Other long term (current) drug therapy: Secondary | ICD-10-CM | POA: Insufficient documentation

## 2013-01-11 DIAGNOSIS — I10 Essential (primary) hypertension: Secondary | ICD-10-CM | POA: Insufficient documentation

## 2013-01-11 LAB — CBC
MCHC: 31.3 g/dL (ref 30.0–36.0)
RDW: 23.2 % — ABNORMAL HIGH (ref 11.5–15.5)

## 2013-01-11 LAB — PROTIME-INR
INR: 1.5 — ABNORMAL HIGH (ref 0.00–1.49)
Prothrombin Time: 17.7 seconds — ABNORMAL HIGH (ref 11.6–15.2)

## 2013-01-11 LAB — APTT: aPTT: 37 seconds (ref 24–37)

## 2013-01-11 MED ORDER — MIDAZOLAM HCL 2 MG/2ML IJ SOLN
INTRAMUSCULAR | Status: AC
  Filled 2013-01-11: qty 6

## 2013-01-11 MED ORDER — MIDAZOLAM HCL 2 MG/2ML IJ SOLN
INTRAMUSCULAR | Status: AC | PRN
  Administered 2013-01-11 (×2): 1 mg via INTRAVENOUS

## 2013-01-11 MED ORDER — FENTANYL CITRATE 0.05 MG/ML IJ SOLN
INTRAMUSCULAR | Status: AC | PRN
  Administered 2013-01-11 (×2): 50 ug via INTRAVENOUS

## 2013-01-11 MED ORDER — FENTANYL CITRATE 0.05 MG/ML IJ SOLN
INTRAMUSCULAR | Status: AC
  Filled 2013-01-11: qty 6

## 2013-01-11 MED ORDER — SODIUM CHLORIDE 0.9 % IV SOLN
INTRAVENOUS | Status: DC
  Administered 2013-01-11: 07:00:00 via INTRAVENOUS

## 2013-01-11 NOTE — H&P (Signed)
Chief Complaint: "I am here for a bone marrow biopsy today, I have had low blood counts and enlarged spleen." Referring Physician: Dr. Myna Hidalgo  HPI: Sabrina [name] is an 57 y.o. female with PMHx of pancytopenia. Patient had recent CT 12/30/2012 which demonstrated marked splenomegaly from previous CT. Patient states she has history of low Hgb requiring a blood transfusion. She denies any recent illness, fever or chills. She states she recently fell and has a significant size bruise on her RLE. She denies any other non-traumatic bruising. She denies any chest pain, shortness of breath, blood in stool or urine.   Past Medical History:  Past Medical History  Diagnosis Date  . Arthritis   . Allergic rhinitis   . Colitis   . Degenerative disc disease   . Hyperlipidemia   . Hypertension   . Celiac disease   . Bipolar disorder   . Macular degeneration   . Interstitial cystitis   . Diabetes mellitus without complication   . Anemia, iron deficiency 10/30/2012    Past Surgical History:  Past Surgical History  Procedure Laterality Date  . Tubal ligation    . Appendectomy    . Lumbar laminectomy    . Carpal tunnel release      bilateral  . Cesarean section    . Cysto/ hod/ instillatio clorpactin  07-03-2008  &  06-09-2001    INTERSTITIAL CYSTITIS  . Shoulder arthroscopy w/ rotator cuff repair  01-25-2008    LEFT SHOULDER /   INCLUDING CAPSULECTOMY WITH DEBRIDEMENT/  SAD WITH RESECTION OF AC  . Right shoulder arthroscopy/ open resection distal clavicle/ debridement adhesive capsulitis/ rotator cuff repair  08-30-2003  . Tonsillectomy and adenoidectomy    . Transthoracic echocardiogram  03-28-2011    NORMAL LVSF/ EF 60-65%/ VERY MILD AORTIC STENOSIS, TRIVIAL REGURG./ MILDLY DILATED LEFT ATRIUM  . Cystoscopy  11/26/2011    Procedure: CYSTOSCOPY;  Surgeon: Valetta Fuller, MD;  Location: [facility];  Service: Urology;  Laterality: N/A;  clorpactin  in bladder    Family History:   Family History  Problem Relation Age of Onset  . Diabetes Mother   . Hypertension Mother   . Diabetes Brother   . Hypertension Brother   . Hyperlipidemia Brother   . Hypertension    . Colon cancer Neg Hx     Social History:  reports that she has never smoked. She has never used smokeless tobacco. She reports that she does not drink alcohol or use illicit drugs.  Allergies:  Allergies  Allergen Reactions  . Sulfonamide Derivatives     Classic angioedema reaction  . Wheat Bran     Celiac's disease      Medication List    ASK your doctor about these medications       ARIPiprazole 10 MG tablet  Commonly known as:  ABILIFY  Take 10 mg by mouth at bedtime.     fexofenadine 180 MG tablet  Commonly known as:  ALLEGRA  Take 180 mg by mouth every morning.     FLUoxetine 40 MG capsule  Commonly known as:  PROZAC  Take 40 mg by mouth every morning.     iron polysaccharides 150 MG capsule  Commonly known as:  NIFEREX  Take 150 mg by mouth 2 (two) times daily. Pt states she takes poly-iron.  This was the closest match.  Not entirely sure this is it.  Pt takes 1 tablet BID     LORazepam 0.5 MG tablet  Commonly known as:  ATIVAN  Take 0.5 mg by mouth 2 (two) times daily.     mesalamine 1.2 G EC tablet  Commonly known as:  LIALDA  Take 1,200 mg by mouth 2 (two) times daily.     metFORMIN 500 MG tablet  Commonly known as:  GLUCOPHAGE  Take 1 tablet (500 mg total) by mouth 2 (two) times daily with a meal.     multivitamin with minerals Tabs  Take 1 tablet by mouth every morning.     omeprazole 40 MG capsule  Commonly known as:  PRILOSEC  Take 1 capsule (40 mg total) by mouth 2 (two) times daily.     pentosan polysulfate 100 MG capsule  Commonly known as:  ELMIRON  Take 200 mg by mouth 2 (two) times daily.     traZODone 50 MG tablet  Commonly known as:  DESYREL  Take 25 mg by mouth at bedtime.     Vitamin D (Ergocalciferol) 50000 UNITS Caps  Commonly known as:   DRISDOL  Take 50,000 Units by mouth every 7 (seven) days. Friday        Please HPI for pertinent positives, otherwise complete 10 system ROS negative.  Physical Exam: BP 105/51  Pulse 75  Temp(Src) 98.2 F (36.8 C) (Oral)  Resp 18  Ht 5\' 4"  (1.626 m)  Wt 139 lb (63.05 kg)  BMI 23.85 kg/m2  SpO2 99% Body mass index is 23.85 kg/(m^2).   General Appearance:  Alert, cooperative, no distress, appears stated age  Head:  Normocephalic, without obvious abnormality, atraumatic  ENT: Unremarkable  Neck: Supple, symmetrical, trachea midline  Lungs:   Clear to auscultation bilaterally, no w/r/r, respirations unlabored without use of accessory muscles.  Chest Wall:  No tenderness or deformity  Heart:  Regular rate and rhythm, S1, S2 normal, systolic murmur present over aortic area, no rub or gallop.  Abdomen:   Soft, non-tender, non distended.  Extremities: Extremities normal, ecchymotic region lateral aspect of superior RLE, no cyanosis or edema  Pulses: 2+ and symmetric  Neurologic: Normal affect, no gross deficits.   Results for orders placed during the hospital encounter of 01/11/13 (from the past 48 hour(s))  APTT     Status: None   Collection Time    01/11/13  7:10 AM      Result Value Range   aPTT 37  24 - 37 seconds   Comment:            IF BASELINE aPTT IS ELEVATED,     SUGGEST PATIENT RISK ASSESSMENT     BE USED TO DETERMINE APPROPRIATE     ANTICOAGULANT THERAPY.  CBC     Status: Abnormal   Collection Time    01/11/13  7:10 AM      Result Value Range   WBC 2.0 (*) 4.0 - 10.5 K/uL   RBC 3.95  3.87 - 5.11 MIL/uL   Hemoglobin 9.9 (*) 12.0 - 15.0 g/dL   HCT 24.4 (*) 01.0 - 27.2 %   MCV 80.0  78.0 - 100.0 fL   MCH 25.1 (*) 26.0 - 34.0 pg   MCHC 31.3  30.0 - 36.0 g/dL   RDW 53.6 (*) 64.4 - 03.4 %   Platelets 96 (*) 150 - 400 K/uL   Comment: RESULT REPEATED AND VERIFIED     SPECIMEN CHECKED FOR CLOTS     CONSISTENT WITH PREVIOUS RESULT  PROTIME-INR     Status:  Abnormal   Collection Time  01/11/13  7:10 AM      Result Value Range   Prothrombin Time 17.7 (*) 11.6 - 15.2 seconds   INR 1.50 (*) 0.00 - 1.49   No results found.  Assessment/Plan Splenomegaly. Pancytopenia. Patient scheduled for CT Guided Bone Marrow Biopsy today, accompanied by her friend. Patient states she has been NPO all evening and denies any known allergies to sedation medication, betadine, or lidocaine.  Risks and Benefits discussed with the patient. All of the patient's questions were answered, patient is agreeable to proceed. Consent signed and in chart.   Pattricia Boss D PA-C 01/11/2013, 8:26 AM

## 2013-01-11 NOTE — Telephone Encounter (Signed)
No answer, message left for the patient. 

## 2013-01-11 NOTE — Procedures (Signed)
Procedure:  CT guided bone marrow biopsy Findings:  Aspirate and core biopsy of right iliac bone marrow.

## 2013-01-11 NOTE — H&P (Signed)
Agree 

## 2013-01-12 ENCOUNTER — Ambulatory Visit (HOSPITAL_BASED_OUTPATIENT_CLINIC_OR_DEPARTMENT_OTHER): Payer: 59

## 2013-01-12 ENCOUNTER — Ambulatory Visit: Payer: 59 | Admitting: Neurology

## 2013-01-12 VITALS — BP 101/40 | HR 70 | Temp 98.1°F | Resp 20

## 2013-01-12 DIAGNOSIS — D509 Iron deficiency anemia, unspecified: Secondary | ICD-10-CM

## 2013-01-12 MED ORDER — SODIUM CHLORIDE 0.9 % IV SOLN
Freq: Once | INTRAVENOUS | Status: AC
  Administered 2013-01-12: 10:00:00 via INTRAVENOUS

## 2013-01-12 MED ORDER — SODIUM CHLORIDE 0.9 % IV SOLN
1020.0000 mg | Freq: Once | INTRAVENOUS | Status: AC
  Administered 2013-01-12: 1020 mg via INTRAVENOUS
  Filled 2013-01-12: qty 34

## 2013-01-12 NOTE — Patient Instructions (Signed)
Ferumoxytol injection What is this medicine? FERUMOXYTOL is an iron complex. Iron is used to make healthy red blood cells, which carry oxygen and nutrients throughout the body. This medicine is used to treat iron deficiency anemia in people with chronic kidney disease. This medicine may be used for other purposes; ask your health care provider or pharmacist if you have questions. What should I tell my health care provider before I take this medicine? They need to know if you have any of these conditions: -anemia not caused by low iron levels -high levels of iron in the blood -magnetic resonance imaging (MRI) test scheduled -an unusual or allergic reaction to iron, other medicines, foods, dyes, or preservatives -pregnant or trying to get pregnant -breast-feeding How should I use this medicine? This medicine is for infusion into a vein. It is given by a health care professional in a hospital or clinic setting. Talk to your pediatrician regarding the use of this medicine in children. Special care may be needed. Overdosage: If you think you've taken too much of this medicine contact a poison control center or emergency room at once. Overdosage: If you think you have taken too much of this medicine contact a poison control center or emergency room at once. NOTE: This medicine is only for you. Do not share this medicine with others. What if I miss a dose? It is important not to miss your dose. Call your doctor or health care professional if you are unable to keep an appointment. What may interact with this medicine? This medicine may interact with the following medications: -other iron products This list may not describe all possible interactions. Give your health care provider a list of all the medicines, herbs, non-prescription drugs, or dietary supplements you use. Also tell them if you smoke, drink alcohol, or use illegal drugs. Some items may interact with your medicine. What should I watch  for while using this medicine? Visit your doctor or healthcare professional regularly. Tell your doctor or healthcare professional if your symptoms do not start to get better or if they get worse. You may need blood work done while you are taking this medicine. You may need to follow a special diet. Talk to your doctor. Foods that contain iron include: whole grains/cereals, dried fruits, beans, or peas, leafy green vegetables, and organ meats (liver, kidney). What side effects may I notice from receiving this medicine? Side effects that you should report to your doctor or health care professional as soon as possible: -allergic reactions like skin rash, itching or hives, swelling of the face, lips, or tongue -breathing problems -changes in blood pressure -feeling faint or lightheaded, falls -fever or chills -flushing, sweating, or hot feelings -swelling of the ankles or feet Side effects that usually do not require medical attention (Report these to your doctor or health care professional if they continue or are bothersome.): -diarrhea -headache -nausea, vomiting -stomach pain This list may not describe all possible side effects. Call your doctor for medical advice about side effects. You may report side effects to FDA at 1-800-FDA-1088. Where should I keep my medicine? This drug is given in a hospital or clinic and will not be stored at home. NOTE: This sheet is a summary. It may not cover all possible information. If you have questions about this medicine, talk to your doctor, pharmacist, or health care provider.  2013, [employer]vier/[employer] Standard. (03/08/2008 9:48:25 PM)  

## 2013-01-14 ENCOUNTER — Other Ambulatory Visit: Payer: Self-pay | Admitting: Hematology & Oncology

## 2013-01-14 DIAGNOSIS — D61818 Other pancytopenia: Secondary | ICD-10-CM

## 2013-01-17 ENCOUNTER — Telehealth: Payer: Self-pay | Admitting: Hematology & Oncology

## 2013-01-17 NOTE — Telephone Encounter (Signed)
Pt aware of 7-22

## 2013-01-18 ENCOUNTER — Ambulatory Visit (HOSPITAL_BASED_OUTPATIENT_CLINIC_OR_DEPARTMENT_OTHER): Payer: 59 | Admitting: Hematology & Oncology

## 2013-01-18 ENCOUNTER — Other Ambulatory Visit (HOSPITAL_BASED_OUTPATIENT_CLINIC_OR_DEPARTMENT_OTHER): Payer: 59 | Admitting: Lab

## 2013-01-18 ENCOUNTER — Ambulatory Visit (HOSPITAL_BASED_OUTPATIENT_CLINIC_OR_DEPARTMENT_OTHER): Payer: 59

## 2013-01-18 VITALS — BP 107/52 | HR 71 | Temp 97.8°F | Resp 16 | Ht 64.0 in | Wt 142.0 lb

## 2013-01-18 DIAGNOSIS — D61818 Other pancytopenia: Secondary | ICD-10-CM

## 2013-01-18 DIAGNOSIS — R161 Splenomegaly, not elsewhere classified: Secondary | ICD-10-CM

## 2013-01-18 DIAGNOSIS — Z23 Encounter for immunization: Secondary | ICD-10-CM

## 2013-01-18 DIAGNOSIS — D509 Iron deficiency anemia, unspecified: Secondary | ICD-10-CM

## 2013-01-18 LAB — CBC WITH DIFFERENTIAL (CANCER CENTER ONLY)
BASO#: 0 10*3/uL (ref 0.0–0.2)
EOS%: 6.3 % (ref 0.0–7.0)
Eosinophils Absolute: 0.1 10*3/uL (ref 0.0–0.5)
HCT: 32.2 % — ABNORMAL LOW (ref 34.8–46.6)
HGB: 10.1 g/dL — ABNORMAL LOW (ref 11.6–15.9)
LYMPH#: 0.6 10*3/uL — ABNORMAL LOW (ref 0.9–3.3)
MCH: 26.6 pg (ref 26.0–34.0)
MCHC: 31.4 g/dL — ABNORMAL LOW (ref 32.0–36.0)
NEUT%: 56.5 % (ref 39.6–80.0)
RBC: 3.8 10*6/uL (ref 3.70–5.32)

## 2013-01-18 LAB — RETICULOCYTES (CHCC): RBC.: 3.93 MIL/uL (ref 3.87–5.11)

## 2013-01-18 MED ORDER — SODIUM CHLORIDE 0.9 % IJ SOLN
3.0000 mL | Freq: Once | INTRAMUSCULAR | Status: DC | PRN
  Filled 2013-01-18: qty 10

## 2013-01-18 MED ORDER — HEPARIN SOD (PORK) LOCK FLUSH 100 UNIT/ML IV SOLN
500.0000 [IU] | Freq: Once | INTRAVENOUS | Status: DC | PRN
  Filled 2013-01-18: qty 5

## 2013-01-18 MED ORDER — MENINGOCOCCAL VAC A,C,Y,W-135 ~~LOC~~ INJ
0.5000 mL | INJECTION | Freq: Once | SUBCUTANEOUS | Status: AC
  Administered 2013-01-18: 0.5 mL via SUBCUTANEOUS
  Filled 2013-01-18: qty 0.5

## 2013-01-18 MED ORDER — SODIUM CHLORIDE 0.9 % IJ SOLN
10.0000 mL | INTRAMUSCULAR | Status: DC | PRN
  Filled 2013-01-18: qty 10

## 2013-01-18 MED ORDER — PNEUMOCOCCAL VAC POLYVALENT 25 MCG/0.5ML IJ INJ
0.5000 mL | INJECTION | INTRAMUSCULAR | Status: AC
  Administered 2013-01-18: 0.5 mL via INTRAMUSCULAR
  Filled 2013-01-18: qty 0.5

## 2013-01-18 MED ORDER — ALTEPLASE 2 MG IJ SOLR
2.0000 mg | Freq: Once | INTRAMUSCULAR | Status: DC | PRN
  Filled 2013-01-18: qty 2

## 2013-01-18 MED ORDER — HEPARIN SOD (PORK) LOCK FLUSH 100 UNIT/ML IV SOLN
250.0000 [IU] | Freq: Once | INTRAVENOUS | Status: DC | PRN
  Filled 2013-01-18: qty 5

## 2013-01-18 MED ORDER — HAEMOPHILUS B POLYSAC CONJ VAC IM SOLR
0.5000 mL | Freq: Once | INTRAMUSCULAR | Status: AC
  Administered 2013-01-18: 0.5 mL via INTRAMUSCULAR
  Filled 2013-01-18: qty 0.5

## 2013-01-18 NOTE — Progress Notes (Signed)
CC:   Sabrina [name], M.D. Angelia Mould. Derrell Lolling, M.D.  DIAGNOSIS: 1. Splenomegaly, etiology unknown. 2. Iron-deficiency anemia. 3. Leukopenia/thrombocytopenia.  CURRENT THERAPY:  Patient is status post IV iron.  INTERIM HISTORY:  Sabrina [name] comes in for followup.  We initially saw her back on June 30th.  Since then, we put her through quite a few tests.  We put her through scans.  The CT scan showed a marked splenomegaly.  Her spleen measured 20 cm.  It previously measured 8.2 cm.  No lymphadenopathy was appreciated.  She had moderate cardiac enlargement.  We then put her through a bone marrow biopsy.  This was done on July 15th.  The pathology report ([id number]) showed a hypercellular marrow.  She had several lymphoid aggregates.  There is no monoclonal population of cells to suggest a lymphoproliferative process.  We did go ahead and give her IV iron.  This was done on July 16th.  The iron studies that we did on her when we first saw showed a ferritin of only 16 with an iron saturation of 14%.  The patient had a normal vitamin B12 level.  I think that her spleen is going to have to come out.  So far, we have not yet identified an etiology for the splenomegaly.  I spoke with Dr. Claud Kelp of [facility].  He will see Sabrina [name].  Of note, the other studies that we did on her showed a negative monoclonal spike with her SPEP.  She had a normal renal function. Folate was okay.  She has had no problem with fevers.  She does have some left-sided abdominal pain.  She has had no cough.  She has had no bleeding.  PHYSICAL EXAMINATION:  General:  This is a well-developed, well- nourished white female in no obvious distress.  Vital signs: Temperature of 97.8, pulse 71, respiratory rate 16, blood pressure 107/52.  Weight is 142.  Head and neck exam:  Normocephalic, atraumatic skull.  There are no ocular or oral lesions.  There are no palpable cervical or  supraclavicular lymph nodes.  Lungs:  Clear bilaterally. Cardiac exam:  Regular rate and rhythm with a normal S1, S2.  There are no murmurs, rubs or bruits.  Abdominal exam:  Soft with good bowel sounds.  There is no palpable abdominal mass.  There is no fluid wave. Spleen tip is about 4-5 cm below the left costal margin.  Extremities: Show no clubbing, cyanosis or edema.  IMPRESSION:  Sabrina [name] is a very charming 57 year old white female with splenomegaly.  Again, I think her spleen is going to have to come out.  I do not see any other tests that we can do try to identify what is going on.  It is possible that she just may have some type of congestive splenomegaly.  I suppose that she could still have some splenic lymphoma.  Typically, one sees villous lymphocytes in the bone marrow.  We will go ahead and give her her triple vaccine today.  We will plan on getting her back to see [place] after Labor Day.  By then, she will have had her surgery.  I am sure that Dr. Derrell Lolling will let me know when her surgery will be so that I can see her while she is in the hospital.  I spent a good 45 minutes with Sabrina. Kerper talking to her about the situation.    ______________________________ Josph Macho, M.D. PRE/MEDQ  D:  01/18/2013  T:  01/18/2013  Job:  [id number]

## 2013-01-18 NOTE — Progress Notes (Signed)
This office note has been dictated.

## 2013-01-19 ENCOUNTER — Telehealth (INDEPENDENT_AMBULATORY_CARE_PROVIDER_SITE_OTHER): Payer: Self-pay

## 2013-01-19 LAB — CHROMOSOME ANALYSIS, BONE MARROW

## 2013-01-19 NOTE — Telephone Encounter (Signed)
I called and scheduled the pt to come in and see Dr Derrell Lolling per Dr Gustavo Lah request.  She is coming in to evaluate her spleen.  I scheduled her for 8/11 at 5 pm, arrive at 4:30.  She was concerned about waiting that long since her spleen is enlarged.  I told her I will let Dr Derrell Lolling know and see if it is ok to wait that long or if he has suggestions for a sooner date.

## 2013-01-19 NOTE — Telephone Encounter (Signed)
[place] Dec,  Call her and tell her that I am willing to try to come over at 5:00 pm some day next week while I'm the LDOW. Let her know the circumstances. Tell her that it will probably work out, but it's always possible that I'll be tied up with an emergency.  Let me know.  hmi

## 2013-01-20 NOTE — Telephone Encounter (Signed)
I called the pt and let her know that we will try to work her in next as per Dr Jacinto Halim message below.  I told her the chance of him getting caught in an emergency.  She understands.  She works on [address] and can be reached at [phone] ext [phone].  I will be in touch with her next week.

## 2013-01-24 ENCOUNTER — Encounter: Payer: Self-pay | Admitting: Hematology & Oncology

## 2013-01-26 NOTE — Telephone Encounter (Signed)
I notified the pt we couldn't work her in today

## 2013-01-27 NOTE — Telephone Encounter (Signed)
I called and notified the pt that Dr Derrell Lolling may be able to see Sabrina [name] today.  I will check with him about 3pm and call Sabrina [name] back with a time.

## 2013-01-27 NOTE — Telephone Encounter (Signed)
I called the pt back to let her know that Dr Derrell Lolling can't see her today.  We will try again tomorrow.  She asked me about FMLA protocol and if it's possible to get some papers signed tomorrow or by next Friday.  I told her if we see her tomorrow it is possible but he is out of the office week.  She will try to get an extension.

## 2013-01-28 ENCOUNTER — Ambulatory Visit (INDEPENDENT_AMBULATORY_CARE_PROVIDER_SITE_OTHER): Payer: 59 | Admitting: General Surgery

## 2013-01-28 ENCOUNTER — Encounter (INDEPENDENT_AMBULATORY_CARE_PROVIDER_SITE_OTHER): Payer: Self-pay | Admitting: General Surgery

## 2013-01-28 VITALS — BP 120/70 | HR 66 | Resp 12 | Ht 64.0 in | Wt 139.0 lb

## 2013-01-28 DIAGNOSIS — K8020 Calculus of gallbladder without cholecystitis without obstruction: Secondary | ICD-10-CM

## 2013-01-28 DIAGNOSIS — D7381 Neutropenic splenomegaly: Secondary | ICD-10-CM

## 2013-01-28 NOTE — Patient Instructions (Signed)
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 You also have gallstones, which may or may not be contributing to your bloating and early satiety after meals. You also had a recent esophagogastric ulcer, which fortunately has healed.  You will be scheduled for open splenectomy and possible cholecystectomy in the near future.      Open Splenectomy An open splenectomy is surgery to remove your spleen.The spleen is a small organ. It is located on the left side of your abdomen, just below your lung. The spleen has several jobs. It helps fight infection by acting like a giant lymph node. The spleen mayneed to be taken out if it is damaged in a crash or other trauma.It may also need to be taken out if you have certain diseases. Idiopathic thrombocytopenia purpura (ITP) is one such disease. This disease causes your spleen to destroy too many blood cells that are needed for clotting. The procedure is called "open" because your surgeon will open your abdomen with a cut (incision). LET YOUR CAREGIVER KNOW ABOUT:  Any allergies.  All medicines you are taking, including:  Herbs, eyedrops, over-the-counter medicines, and creams.  Blood thinners (anticoagulants), aspirin, or other drugs that could affect blood clotting.  Use of steroids (by mouth or as creams).  Previous problems with anesthesia.  Possibility of pregnancy, if this applies.  Any history of blood clots.  Any history of bleeding or other blood problems.  Previous surgery.  Other health problems. RISKS AND COMPLICATIONS  Bleeding.  Damage to other organs near the spleen, especially the large intestine, stomach, or pancreas.  Infection. This could occur:  At the incision.  Where the spleen was located, especially if there was damage to the nearby organs.  In the lungs. This is called pneumonia.  In your whole body. This is called overwhelming post-splenectomy infection (OPSI). It occurs only in 1 out  of 10,000 adults who have had their spleen removed.  A blood clot that forms somewhere away from the surgery, such as in the legs, arms, or other big veins. Blood clots may rarely travel to the lung.  A hernia. This occurs when the incision does not heal correctly, causing a bulge near the incision. BEFORE THE PROCEDURE  A medical evaluation will be done. This may include:  A physical exam.  Tests to make sure you are healthy enough for the procedure. These may include blood tests, tests on your heart, and X-rays.  Talking with the person who will be in charge of the anesthesia during the procedure. Ask what you can expect.  If you smoke, quit a few weeks before the procedure.  A week before the procedure, stop taking drugs that can cause bleeding during and after your procedure.  This includes aspirin and pain medicines called NSAIDs (nonsteroidal anti-inflammatory drugs), such as ibuprofen and naproxen. Also stop taking vitamin E.  If you take any other prescription blood thinners, ask your surgeon when you should stop taking them.  You may be given vaccinations to help prevent infections. This may be needed because not having a spleen makes certain infections more dangerous.  Sometimes, blood or platelet transfusions may be needed.  The night before your procedure, do not eat or drink anything after midnight.  Ask your caregiver about changing or stopping your regular medicines.  Ask your caregiver if you need to arrive early before the procedure. You might need another procedure before your splenectomy. This procedure is done to clot off the blood vessels that carry  blood to your spleen. PROCEDURE An open splenectomy can take about 2 hours or more, depending on your body.  You will be given medicine that makes you sleep (general anesthetic).  Once you are asleep, the surgeon will make an incision. This may be done in the middle of your abdomen or under your rib cage.  The  surgeon will find the spleen. The surgeon will also look for extra spleen tissue in the area, which some people have.  The spleen and any extra spleen tissue that may be found will be taken out.  Your surgeon may decide to leave a drain. This is a small tube that comes out of the skin, near the incision.  The surgeon will close the abdomen.  A bandage (dressing) will be put over the incision. AFTER THE PROCEDURE  You will stay in a recovery area until the anesthesia has worn off.Your blood pressure and pulse will be monitored. Then you will be taken to your room.  Pain is normal after a splenectomy. You will be given pain medicine. Be sure to tell you caregiver if the pain gets worse or the pain medicine is not working.  You may be asked to get up and start walking within a day.This helps your intestines start working again. It also helps keep blood clots from forming in your legs.  Most people stay in the hospital for several days after this procedure. Then you can go home to continue getting better. Most people who have an open splenectomy fully recover in 4 to 6 weeks. Document Released: 10/11/2010 Document Revised: 09/08/2011 Document Reviewed: 10/11/2010 [facility] Patient Information 2014 [place], [place].

## 2013-01-28 NOTE — Telephone Encounter (Signed)
Patient was seen today.

## 2013-01-28 NOTE — Progress Notes (Signed)
Patient ID: Sabrina [name], female   DOB: 1956-01-27, 57 y.o.   MRN: [id number]  Chief Complaint  Patient presents with  . New Evaluation    eval spleen    HPI Sabrina [name] is a 57 y.o. female.  She is referred to me by Dr. Arlan Organ for consideration of splenectomy for neutropenic splenomegaly. She also has gallstones. Dr. [name] is her gastroenterologist. Dr. Neena Rhymes he is her primary care physician. Dr. Evelene Croon is her psychiatrist.   She was recently found to have neutropenia and thrombocytopenia. She was referred to Dr. Myna Hidalgo. Blood work showed hemoglobin 9.9, white blood cell count 2,000, platelet count 96,000, prothrombin time 17.7, INR 1.8. A CT scan was performed which shows no abnormalities in the chest except for slight cardiac enlargement. In the abdomen she was found to have significant splenomegaly with progression from 8.2 cm in 2006-20 cm in craniocaudal dimension. The stomach looked normal. There were some upper abdominal varices with recanalization of the umbilical vein. Numerous gallstones were noted. There is no mass or adenopathy noted.  She was also diagnosed with an antral ulcer. That was treated by [name].. Followup endoscopy on July 14 showed substantial healing of the ulcer but still with residual ulceration. Biopsies showed benign changes, and negative for H. Pylori. The GE junction and fundus looked normal. No varices were seen.  Dr. Myna Hidalgo performed a bone marrow which showed hypercellular bone marrow for age. Flow cytometry was negative. Correlation with cytogenetic studies was recommended. At this point Dr. Myna Hidalgo referred her for splenectomy and discussed this with me. She has received all of her vaccines in his office.  Symptomatically she states that she was transfused with 3 units of packed cells in May for a hemoglobin of 4.6. She has some epigastric pain, left upper quadrant and right upper quadrant pain. She has early satiety and post  prandial boating. She says she's had a 70 pound weight loss over the past 4 months.  She denies any history of liver disease. Specifically denies any history of alcohol abuse, hepatitis or cirrhosis.  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 HPI  Past Medical History  Diagnosis Date  . Arthritis   . Allergic rhinitis   . Colitis   . Degenerative disc disease   . Hyperlipidemia   . Hypertension   . Celiac disease   . Bipolar disorder   . Macular degeneration   . Interstitial cystitis   . Diabetes mellitus without complication   . Anemia, iron deficiency 10/30/2012    Past Surgical History  Procedure Laterality Date  . Tubal ligation    . Appendectomy    . Lumbar laminectomy    . Carpal tunnel release      bilateral  . Cesarean section    . Cysto/ hod/ instillatio clorpactin  07-03-2008  &  06-09-2001    INTERSTITIAL CYSTITIS  . Shoulder arthroscopy w/ rotator cuff repair  01-25-2008    LEFT SHOULDER /   INCLUDING CAPSULECTOMY WITH DEBRIDEMENT/  SAD WITH RESECTION OF AC  . Right shoulder arthroscopy/ open resection distal clavicle/ debridement adhesive capsulitis/ rotator cuff repair  08-30-2003  . Tonsillectomy and adenoidectomy    . Transthoracic echocardiogram  03-28-2011    NORMAL LVSF/ EF 60-65%/ VERY MILD AORTIC STENOSIS, TRIVIAL REGURG./ MILDLY DILATED LEFT ATRIUM  . Cystoscopy  11/26/2011    Procedure: CYSTOSCOPY;  Surgeon: Valetta Fuller, MD;  [facility];  Service: Urology;  Laterality:  N/A;  clorpactin  in bladder    Family History  Problem Relation Age of Onset  . Diabetes Mother   . Hypertension Mother   . Diabetes Brother   . Hypertension Brother   . Hyperlipidemia Brother   . Hypertension    . Colon cancer Neg Hx     Social History History  Substance Use Topics  . Smoking status: Never Smoker   . Smokeless tobacco: Never Used  . Alcohol Use: No    Allergies  Allergen Reactions  .  Sulfonamide Derivatives     Classic angioedema reaction  . Wheat Bran     Celiac's disease    Current Outpatient Prescriptions  Medication Sig Dispense Refill  . ARIPiprazole (ABILIFY) 10 MG tablet Take 10 mg by mouth at bedtime.       . fexofenadine (ALLEGRA) 180 MG tablet Take 180 mg by mouth every morning.       Marland Kitchen FLUoxetine (PROZAC) 40 MG capsule Take 40 mg by mouth every morning.       . iron polysaccharides (POLY-IRON 150) 150 MG capsule Take 150 mg by mouth 2 (two) times daily.      Marland Kitchen LORazepam (ATIVAN) 0.5 MG tablet Take 0.5 mg by mouth 2 (two) times daily.       . mesalamine (LIALDA) 1.2 G EC tablet Take 1,200 mg by mouth 2 (two) times daily.      . metFORMIN (GLUCOPHAGE) 500 MG tablet Take 1 tablet (500 mg total) by mouth 2 (two) times daily with a meal.  180 tablet  3  . Multiple Vitamin (MULTIVITAMIN WITH MINERALS) TABS Take 1 tablet by mouth every morning.      Marland Kitchen omeprazole (PRILOSEC) 40 MG capsule Take 1 capsule (40 mg total) by mouth 2 (two) times daily.  60 capsule  6  . pentosan polysulfate (ELMIRON) 100 MG capsule Take 200 mg by mouth 2 (two) times daily.       . traZODone (DESYREL) 50 MG tablet Take 25 mg by mouth at bedtime.        . Vitamin D, Ergocalciferol, (DRISDOL) 50000 UNITS CAPS Take 50,000 Units by mouth every 7 (seven) days. Friday       No current facility-administered medications for this visit.    Review of Systems Review of Systems  Constitutional: Positive for appetite change and unexpected weight change. Negative for fever and chills.  HENT: Negative for hearing loss, congestion, sore throat, trouble swallowing and voice change.   Eyes: Negative for visual disturbance.  Respiratory: Negative for cough and wheezing.   Cardiovascular: Negative for chest pain, palpitations and leg swelling.       [place]  Gastrointestinal: Positive for abdominal pain. Negative for nausea, vomiting, diarrhea, constipation, blood in stool, abdominal distention and anal  bleeding.  Genitourinary: Negative for hematuria, vaginal bleeding and difficulty urinating.  Musculoskeletal: Negative for arthralgias.  Skin: Negative for rash and wound.  Neurological: Negative for seizures, syncope and headaches.  Hematological: Negative for adenopathy. Does not bruise/bleed easily.  Psychiatric/Behavioral: Negative for confusion.    Blood pressure 120/70, pulse 66, resp. rate 12, height 5\' 4"  (1.626 m), weight 139 lb (63.05 kg).  Physical Exam Physical Exam  Constitutional: She is oriented to person, place, and time. She appears well-developed and well-nourished. No distress.  Weight 139. Height 5 feet 4.  HENT:  Head: Normocephalic and atraumatic.  Nose: Nose normal.  Mouth/Throat: No oropharyngeal exudate.  Eyes: Conjunctivae and EOM are normal. Pupils are equal, round, and  reactive to light. Left eye exhibits no discharge. No scleral icterus.  Neck: Neck supple. No JVD present. No tracheal deviation present. No thyromegaly present.  Cardiovascular: Normal rate, regular rhythm and intact distal pulses.   Murmur heard. Soft systolic murmur  Pulmonary/Chest: Effort normal and breath sounds normal. No respiratory distress. She has no wheezes. She has no rales. She exhibits no tenderness.  Abdominal: Soft. Bowel sounds are normal. She exhibits no distension and no mass. There is no tenderness. There is no rebound and no guarding.  Right lower quadrant vertical, paramedian incision from appendectomy. Pfannenstiel incision from C-section. I can palpate the spleen edge in the subcostal area about 2-3 cm below the costal margin. The liver is not palpable. She really is not tender.  Musculoskeletal: She exhibits no edema and no tenderness.  Lymphadenopathy:    She has no cervical adenopathy.  Neurological: She is alert and oriented to person, place, and time. She exhibits normal muscle tone. Coordination normal.  Skin: Skin is warm. No rash noted. She is not  diaphoretic. No erythema. No pallor.  Psychiatric: She has a normal mood and affect. Her behavior is normal. Judgment and thought content normal.    Data Reviewed Lab work, CT scan, endoscopy report, pathology reports, Dr. Gustavo Lah notes.etc.  Assessment    Neutropenic splenomegaly. I agree that it is appropriate to proceed with splenectomy. This will have to be done open because of the significant size of the spleen.  Gallstones. These may or may not be symptomatic. I have offered to remove her gallbladder if a splenectomy goes smoothly. She agrees with that  History of recent  antral ulcer, healing  History of recent blood transfusion  Non-insulin-dependent diabetes mellitus  Hypertension  History Crohn's disease  History celiac disease  History bipolar disorder, controlled  Systolic heart murmur  History of an appendectomy  History cesarean section  Significant weight loss     Plan    Scheduled for open splenectomy, cholecystectomy  Vaccines have been given  We will set up appointments and packed RBCs for use in are necessary  She requests [employer] and so we will try to accommodate her  I discussed the indications, details, techniques, numerous risks of the surgery with her. She is aware of the risk of significant bleeding, infection, injury to the pancreas with pancreatitis or fistula, abscess, pneumonia, wound healing problems such as hernia or infection, Uncertainty of hematologic diagnosis postop,and other unforseen  problems. She understands all these issues and all questions are answered. She agrees with this plan.        Angelia Mould. Derrell Lolling, M.D., [facility] General and Minimally invasive Surgery Breast and Colorectal Surgery Office:   [phone] Pager:   [phone]  01/28/2013, 10:26 AM

## 2013-02-01 ENCOUNTER — Other Ambulatory Visit: Payer: 59 | Admitting: Lab

## 2013-02-01 ENCOUNTER — Ambulatory Visit: Payer: 59 | Admitting: Hematology & Oncology

## 2013-02-07 ENCOUNTER — Ambulatory Visit (INDEPENDENT_AMBULATORY_CARE_PROVIDER_SITE_OTHER): Payer: Self-pay | Admitting: General Surgery

## 2013-02-10 ENCOUNTER — Encounter (HOSPITAL_COMMUNITY): Payer: Self-pay | Admitting: Pharmacy Technician

## 2013-02-15 NOTE — Patient Instructions (Signed)
Sabrina [name]  02/15/2013   Your procedure is scheduled on: 02/23/13               Surgery 0830am-1130am   Report to [facility] at    0600  AM.  Call this number if you have problems the morning of surgery: [phone]   Remember:   Do not eat food or drink liquids after midnight.   Take these medicines the morning of surgery with A SIP OF WATER:    Do not wear jewelry, make-up or nail polish.  Do not wear lotions, powders, or perfumes.   Do not shave 48 hours prior to surgery.  Do not bring valuables to the hospital.  Contacts, dentures or bridgework may not be worn into surgery.  Leave suitcase in the car. After surgery it may be brought to your room.  For patients admitted to the hospital, checkout time is 11:00 AM the day of  discharge.      SEE CHG INSTRUCTION SHEET    Please read over the following fact sheets that you were given:  coughing and deep breathing exercises, leg exercises, Blood Transfusion Fact Sheet                Failure to comply with these instructions may result in cancellation of your surgery.                Patient Signature ____________________________              Nurse Signature _____________________________

## 2013-02-16 ENCOUNTER — Encounter (HOSPITAL_COMMUNITY)
Admission: RE | Admit: 2013-02-16 | Discharge: 2013-02-16 | Disposition: A | Discharge status: Home / Self Care | Payer: 59 | Source: Ambulatory Visit | Attending: General Surgery | Admitting: General Surgery

## 2013-02-16 ENCOUNTER — Encounter (HOSPITAL_COMMUNITY): Payer: Self-pay

## 2013-02-16 DIAGNOSIS — Z01812 Encounter for preprocedural laboratory examination: Secondary | ICD-10-CM | POA: Insufficient documentation

## 2013-02-16 HISTORY — DX: Major depressive disorder, single episode, unspecified: F32.9

## 2013-02-16 HISTORY — DX: Depression, unspecified: F32.A

## 2013-02-16 HISTORY — DX: Cardiac murmur, unspecified: R01.1

## 2013-02-16 HISTORY — DX: Anxiety disorder, unspecified: F41.9

## 2013-02-16 LAB — COMPREHENSIVE METABOLIC PANEL
ALT: 36 U/L — ABNORMAL HIGH (ref 0–35)
AST: 69 U/L — ABNORMAL HIGH (ref 0–37)
Alkaline Phosphatase: 109 U/L (ref 39–117)
CO2: 24 mEq/L (ref 19–32)
GFR calc Af Amer: >90 mL/min (ref >90)
GFR calc non Af Amer: >90 mL/min (ref >90)
Glucose, Bld: 252 mg/dL — ABNORMAL HIGH (ref 70–99)
Potassium: 4.1 mEq/L (ref 3.5–5.1)
Sodium: 135 mEq/L (ref 135–145)

## 2013-02-16 LAB — CBC WITH DIFFERENTIAL/PLATELET
Basophils Relative: 1 % (ref 0–1)
Eosinophils Relative: 10 % — ABNORMAL HIGH (ref 0–5)
Hemoglobin: 11 g/dL — ABNORMAL LOW (ref 12.0–15.0)
Lymphocytes Relative: 32 % (ref 12–46)
Monocytes Relative: 4 % (ref 3–12)
Neutrophils Relative %: 53 % (ref 43–77)
RBC: 3.9 MIL/uL (ref 3.87–5.11)
WBC: 1.6 10*3/uL — ABNORMAL LOW (ref 4.0–10.5)

## 2013-02-16 LAB — URINALYSIS, ROUTINE W REFLEX MICROSCOPIC
Glucose, UA: NEGATIVE mg/dL
Hgb urine dipstick: NEGATIVE
Ketones, ur: NEGATIVE mg/dL
Protein, ur: NEGATIVE mg/dL
pH: 6 (ref 5.0–8.0)

## 2013-02-16 LAB — URINE MICROSCOPIC-ADD ON

## 2013-02-16 LAB — PATHOLOGIST SMEAR REVIEW

## 2013-02-16 LAB — APTT: aPTT: 41 seconds — ABNORMAL HIGH (ref 24–37)

## 2013-02-16 NOTE — Progress Notes (Signed)
Please disregard the CBC sent from 01/18/13.  I routed this to you in error.  I just saw patient on preop appointment and sent teh 01/18/13 lab in error.  I apologize.  Thank You.

## 2013-02-16 NOTE — Progress Notes (Addendum)
ECHO 10/30/12 in [facility]  Last office visit with Dr Eden Emms 12/23/12  EKG 10/29/12 [facility]  XR  09/20/12  CT chest 12/30/12 [facility]

## 2013-02-16 NOTE — Progress Notes (Signed)
Faxed abnormal labs via EPIC to Dr Derrell Lolling.  Also called office and spoke with Belenda Cruise at office regarding abnormal labs.

## 2013-02-16 NOTE — Progress Notes (Signed)
Patient denies hypertension at preop visit of 02/16/13.

## 2013-02-17 ENCOUNTER — Other Ambulatory Visit: Payer: Self-pay | Admitting: Internal Medicine

## 2013-02-17 ENCOUNTER — Other Ambulatory Visit: Payer: Self-pay | Admitting: Family Medicine

## 2013-02-18 ENCOUNTER — Other Ambulatory Visit: Payer: Self-pay | Admitting: *Deleted

## 2013-02-18 MED ORDER — POLYSACCHARIDE IRON COMPLEX 150 MG PO CAPS: 150.0000 mg | ORAL_CAPSULE | Freq: Two times a day (BID) | ORAL | Status: DC

## 2013-02-18 NOTE — Telephone Encounter (Signed)
Rx has been filled for Poly-Iron 150 mg.  Ag cma

## 2013-02-21 ENCOUNTER — Other Ambulatory Visit (INDEPENDENT_AMBULATORY_CARE_PROVIDER_SITE_OTHER): Payer: Self-pay | Admitting: General Surgery

## 2013-02-21 NOTE — H&P (Signed)
Sabrina [name]    MRN:  [id number]   Description: 57 year old female  Provider: [name], MD  Department: [facility]        Diagnoses    Neutropenic splenomegaly    -  Primary    289.53    Gallstones        574.20      Reason for Visit    New Evaluation    eval spleen        Current Vitals - Last Recorded    BP Pulse Resp Ht Wt BMI    120/70 66 12 5\' 4"  (1.626 m) 139 lb (63.05 kg) 23.85 kg/m2       History and Physical    [name], MD    Status: Signed                              HPI Sabrina [name] is a 57 y.o. female.  She is referred to me by Dr. Arlan Organ for consideration of splenectomy for neutropenic splenomegaly. She also has gallstones. Dr. [name] is her gastroenterologist. Dr. Neena Rhymes he is her primary care physician. Dr. Evelene Croon is her psychiatrist.    She was recently found to have neutropenia and thrombocytopenia. She was referred to Dr. Myna Hidalgo. Blood work showed hemoglobin 9.9, white blood cell count 2,000, platelet count 96,000, prothrombin time 17.7, INR 1.8. A CT scan was performed which shows no abnormalities in the chest except for slight cardiac enlargement. In the abdomen she was found to have significant splenomegaly with progression from 8.2 cm in 2006-20 cm in craniocaudal dimension. The stomach looked normal. There were some upper abdominal varices with recanalization of the umbilical vein. Numerous gallstones were noted. There is no mass or adenopathy noted.   She was also diagnosed with an antral ulcer. That was treated by [name].. Followup endoscopy on July 14 showed substantial healing of the ulcer but still with residual ulceration. Biopsies showed benign changes, and negative for H. Pylori. The GE junction and fundus looked normal. No varices were seen.   Dr. Myna Hidalgo performed a bone marrow which showed hypercellular bone marrow for age. Flow cytometry was negative. Correlation with  cytogenetic studies was recommended. At this point Dr. Myna Hidalgo referred her for splenectomy and discussed this with me. She has received all of her vaccines in his office.   Symptomatically she states that she was transfused with 3 units of packed cells in May for a hemoglobin of 4.6. She has some epigastric pain, left upper quadrant and right upper quadrant pain. She has early satiety and post prandial boating. She says she's had a 70 pound weight loss over the past 4 months.   She denies any history of liver disease. Specifically denies any history of alcohol abuse, hepatitis or cirrhosis.   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        Past Medical History   Diagnosis  Date   .  Arthritis     .  Allergic rhinitis     .  Colitis     .  Degenerative disc disease     .  Hyperlipidemia     .  Hypertension     .  Celiac disease     .  Bipolar disorder     .  Macular degeneration     .  Interstitial cystitis     .  Diabetes mellitus without complication     .  Anemia, iron deficiency  10/30/2012         Past Surgical History   Procedure  Laterality  Date   .  Tubal ligation       .  Appendectomy       .  Lumbar laminectomy       .  Carpal tunnel release           bilateral   .  Cesarean section       .  Cysto/ hod/ instillatio clorpactin    07-03-2008 &  06-09-2001       INTERSTITIAL CYSTITIS   .  Shoulder arthroscopy w/ rotator cuff repair    01-25-2008       LEFT SHOULDER /   INCLUDING CAPSULECTOMY WITH DEBRIDEMENT/  SAD WITH RESECTION OF AC   .  Right shoulder arthroscopy/ open resection distal clavicle/ debridement adhesive capsulitis/ rotator cuff repair    08-30-2003   .  Tonsillectomy and adenoidectomy       .  Transthoracic echocardiogram    03-28-2011       NORMAL LVSF/ EF 60-65%/ VERY MILD AORTIC STENOSIS, TRIVIAL REGURG./ MILDLY DILATED LEFT ATRIUM   .  Cystoscopy    11/26/2011       Procedure: CYSTOSCOPY;  Surgeon: Valetta Fuller, MD;  Location: [facility]; Service: Urology;  Laterality: N/A;  clorpactin  in bladder         Family History   Problem  Relation  Age of Onset   .  Diabetes  Mother     .  Hypertension  Mother     .  Diabetes  Brother     .  Hypertension  Brother     .  Hyperlipidemia  Brother     .  Hypertension       .  Colon cancer  Neg Hx          Social History History   Substance Use Topics   .  Smoking status:  Never Smoker    .  Smokeless tobacco:  Never Used   .  Alcohol Use:  No         Allergies   Allergen  Reactions   .  Sulfonamide Derivatives         Classic angioedema reaction   .  Wheat Bran         Celiac's disease         Current Outpatient Prescriptions   Medication  Sig  Dispense  Refill   .  ARIPiprazole (ABILIFY) 10 MG tablet  Take 10 mg by mouth at bedtime.          .  fexofenadine (ALLEGRA) 180 MG tablet  Take 180 mg by mouth every morning.          Marland Kitchen  FLUoxetine (PROZAC) 40 MG capsule  Take 40 mg by mouth every morning.          .  iron polysaccharides (POLY-IRON 150) 150 MG capsule  Take 150 mg by mouth 2 (two) times daily.         Marland Kitchen  LORazepam (ATIVAN) 0.5 MG tablet  Take 0.5 mg by mouth 2 (two) times daily.          .  mesalamine (LIALDA) 1.2 G EC tablet  Take 1,200 mg by mouth 2 (two) times daily.         .  metFORMIN (GLUCOPHAGE)  500 MG tablet  Take 1 tablet (500 mg total) by mouth 2 (two) times daily with a meal.   180 tablet   3   .  Multiple Vitamin (MULTIVITAMIN WITH MINERALS) TABS  Take 1 tablet by mouth every morning.         Marland Kitchen  omeprazole (PRILOSEC) 40 MG capsule  Take 1 capsule (40 mg total) by mouth 2 (two) times daily.   60 capsule   6   .  pentosan polysulfate (ELMIRON) 100 MG capsule  Take 200 mg by mouth 2 (two) times daily.          .  traZODone (DESYREL) 50 MG tablet  Take 25 mg by mouth at bedtime.           .  Vitamin D, Ergocalciferol, (DRISDOL) 50000 UNITS CAPS  Take 50,000 Units by mouth every 7 (seven)  days. Friday             .        Review of Systems   Constitutional: Positive for appetite change and unexpected weight change. Negative for fever and chills.  HENT: Negative for hearing loss, congestion, sore throat, trouble swallowing and voice change.   Eyes: Negative for visual disturbance.  Respiratory: Negative for cough and wheezing.   Cardiovascular: Negative for chest pain, palpitations and leg swelling.        [place]  Gastrointestinal: Positive for abdominal pain. Negative for nausea, vomiting, diarrhea, constipation, blood in stool, abdominal distention and anal bleeding.  Genitourinary: Negative for hematuria, vaginal bleeding and difficulty urinating.  Musculoskeletal: Negative for arthralgias.  Skin: Negative for rash and wound.  Neurological: Negative for seizures, syncope and headaches.  Hematological: Negative for adenopathy. Does not bruise/bleed easily.  Psychiatric/Behavioral: Negative for confusion.      Blood pressure 120/70, pulse 66, resp. rate 12, height 5\' 4"  (1.626 m), weight 139 lb (63.05 kg).   Physical Exam  Constitutional: She is oriented to person, place, and time. She appears well-developed and well-nourished. No distress.  Weight 139. Height 5 feet 4.  HENT:   Head: Normocephalic and atraumatic.   Nose: Nose normal.   Mouth/Throat: No oropharyngeal exudate.  Eyes: Conjunctivae and EOM are normal. Pupils are equal, round, and reactive to light. Left eye exhibits no discharge. No scleral icterus.  Neck: Neck supple. No JVD present. No tracheal deviation present. No thyromegaly present.  Cardiovascular: Normal rate, regular rhythm and intact distal pulses.    Murmur heard. Soft systolic murmur  Pulmonary/Chest: Effort normal and breath sounds normal. No respiratory distress. She has no wheezes. She has no rales. She exhibits no tenderness.  Abdominal: Soft. Bowel sounds are normal. She exhibits no distension and no mass. There is no  tenderness. There is no rebound and no guarding.  Right lower quadrant vertical, paramedian incision from appendectomy. Pfannenstiel incision from C-section. I can palpate the spleen edge in the subcostal area about 2-3 cm below the costal margin. The liver is not palpable. She really is not tender.  Musculoskeletal: She exhibits no edema and no tenderness.  Lymphadenopathy:    She has no cervical adenopathy.  Neurological: She is alert and oriented to person, place, and time. She exhibits normal muscle tone. Coordination normal.  Skin: Skin is warm. No rash noted. She is not diaphoretic. No erythema. No pallor.  Psychiatric: She has a normal mood and affect. Her behavior is normal. Judgment and thought content normal.      Data Reviewed Lab work, CT  scan, endoscopy report, pathology reports, Dr. Gustavo Lah notes.etc.   Assessment    Neutropenic splenomegaly. I agree that it is appropriate to proceed with splenectomy. This will have to be done open because of the significant size of the spleen.   Gallstones. These may or may not be symptomatic. I have offered to remove her gallbladder if a splenectomy goes smoothly. She agrees with that   History of recent  antral ulcer, healing   History of recent blood transfusion   Non-insulin-dependent diabetes mellitus   Hypertension   History Crohn's disease   History celiac disease   History bipolar disorder, controlled   Systolic heart murmur   History of an appendectomy   History cesarean section   Significant weight loss      Plan    Scheduled for open splenectomy, cholecystectomy   Vaccines have been given   We will set up platelets and packed RBCs for use if they are necessary   She requests [employer] and so we will try to accommodate her   I discussed the indications, details, techniques, numerous risks of the surgery with her. She is aware of the risk of significant bleeding, infection, injury to  the pancreas with pancreatitis or fistula, abscess, pneumonia, wound healing problems such as hernia or infection, Uncertainty of hematologic diagnosis postop,and other unforseen  problems. She understands all these issues and all questions are answered. She agrees with this plan.           Angelia Mould. Derrell Lolling, M.D., [facility] General and Minimally invasive Surgery Breast and Colorectal Surgery Office:   [phone] Pager:   [phone]

## 2013-02-22 ENCOUNTER — Other Ambulatory Visit (INDEPENDENT_AMBULATORY_CARE_PROVIDER_SITE_OTHER): Payer: Self-pay | Admitting: General Surgery

## 2013-02-23 ENCOUNTER — Inpatient Hospital Stay (HOSPITAL_COMMUNITY)
Admission: RE | Admit: 2013-02-23 | Discharge: 2013-02-28 | DRG: 799 | Disposition: A | Discharge status: Home / Self Care | Payer: 59 | Source: Ambulatory Visit | Attending: General Surgery | Admitting: General Surgery

## 2013-02-23 ENCOUNTER — Inpatient Hospital Stay (HOSPITAL_COMMUNITY): Payer: 59

## 2013-02-23 ENCOUNTER — Encounter (HOSPITAL_COMMUNITY)
Admission: RE | Disposition: A | Discharge status: Home / Self Care | Payer: Self-pay | Source: Ambulatory Visit | Attending: General Surgery

## 2013-02-23 ENCOUNTER — Inpatient Hospital Stay (HOSPITAL_COMMUNITY): Payer: 59 | Admitting: Anesthesiology

## 2013-02-23 ENCOUNTER — Encounter (HOSPITAL_COMMUNITY): Payer: Self-pay | Admitting: *Deleted

## 2013-02-23 ENCOUNTER — Encounter (HOSPITAL_COMMUNITY): Payer: Self-pay | Admitting: Anesthesiology

## 2013-02-23 DIAGNOSIS — H3530 Unspecified macular degeneration: Secondary | ICD-10-CM | POA: Diagnosis present

## 2013-02-23 DIAGNOSIS — D696 Thrombocytopenia, unspecified: Secondary | ICD-10-CM | POA: Diagnosis present

## 2013-02-23 DIAGNOSIS — Z79899 Other long term (current) drug therapy: Secondary | ICD-10-CM

## 2013-02-23 DIAGNOSIS — K8010 Calculus of gallbladder with chronic cholecystitis without obstruction: Secondary | ICD-10-CM

## 2013-02-23 DIAGNOSIS — D7381 Neutropenic splenomegaly: Principal | ICD-10-CM | POA: Diagnosis present

## 2013-02-23 DIAGNOSIS — I10 Essential (primary) hypertension: Secondary | ICD-10-CM | POA: Diagnosis present

## 2013-02-23 DIAGNOSIS — IMO0002 Reserved for concepts with insufficient information to code with codable children: Secondary | ICD-10-CM

## 2013-02-23 DIAGNOSIS — J309 Allergic rhinitis, unspecified: Secondary | ICD-10-CM | POA: Diagnosis present

## 2013-02-23 DIAGNOSIS — E43 Unspecified severe protein-calorie malnutrition: Secondary | ICD-10-CM | POA: Diagnosis present

## 2013-02-23 DIAGNOSIS — K824 Cholesterolosis of gallbladder: Secondary | ICD-10-CM

## 2013-02-23 DIAGNOSIS — F319 Bipolar disorder, unspecified: Secondary | ICD-10-CM | POA: Diagnosis present

## 2013-02-23 DIAGNOSIS — E119 Type 2 diabetes mellitus without complications: Secondary | ICD-10-CM | POA: Diagnosis present

## 2013-02-23 DIAGNOSIS — K766 Portal hypertension: Secondary | ICD-10-CM | POA: Diagnosis present

## 2013-02-23 DIAGNOSIS — K769 Liver disease, unspecified: Secondary | ICD-10-CM | POA: Diagnosis present

## 2013-02-23 DIAGNOSIS — K900 Celiac disease: Secondary | ICD-10-CM | POA: Diagnosis present

## 2013-02-23 DIAGNOSIS — N3010 Interstitial cystitis (chronic) without hematuria: Secondary | ICD-10-CM | POA: Diagnosis present

## 2013-02-23 DIAGNOSIS — K8020 Calculus of gallbladder without cholecystitis without obstruction: Secondary | ICD-10-CM | POA: Diagnosis present

## 2013-02-23 DIAGNOSIS — K5090 Crohn's disease, unspecified, without complications: Secondary | ICD-10-CM | POA: Diagnosis present

## 2013-02-23 DIAGNOSIS — I868 Varicose veins of other specified sites: Secondary | ICD-10-CM | POA: Diagnosis present

## 2013-02-23 DIAGNOSIS — E785 Hyperlipidemia, unspecified: Secondary | ICD-10-CM | POA: Diagnosis present

## 2013-02-23 DIAGNOSIS — K259 Gastric ulcer, unspecified as acute or chronic, without hemorrhage or perforation: Secondary | ICD-10-CM | POA: Diagnosis present

## 2013-02-23 DIAGNOSIS — Z01812 Encounter for preprocedural laboratory examination: Secondary | ICD-10-CM

## 2013-02-23 DIAGNOSIS — R188 Other ascites: Secondary | ICD-10-CM | POA: Diagnosis present

## 2013-02-23 DIAGNOSIS — D509 Iron deficiency anemia, unspecified: Secondary | ICD-10-CM | POA: Diagnosis present

## 2013-02-23 DIAGNOSIS — M129 Arthropathy, unspecified: Secondary | ICD-10-CM | POA: Diagnosis present

## 2013-02-23 HISTORY — PX: SPLENECTOMY, TOTAL: SHX788

## 2013-02-23 LAB — PREPARE RBC (CROSSMATCH)

## 2013-02-23 LAB — CBC
Hemoglobin: 11 g/dL — ABNORMAL LOW (ref 12.0–15.0)
MCH: 29.1 pg (ref 26.0–34.0)
MCH: 28.5 pg (ref 26.0–34.0)
MCHC: 33.1 g/dL (ref 30.0–36.0)
MCHC: 32.3 g/dL (ref 30.0–36.0)
Platelets: 96 10*3/uL — ABNORMAL LOW (ref 150–400)
RDW: 20.8 % — ABNORMAL HIGH (ref 11.5–15.5)

## 2013-02-23 LAB — GLUCOSE, CAPILLARY: Glucose-Capillary: 98 mg/dL (ref 70–99)

## 2013-02-23 LAB — BASIC METABOLIC PANEL
BUN: 9 mg/dL (ref 6–23)
Chloride: 102 mEq/L (ref 96–112)
Creatinine, Ser: 0.43 mg/dL — ABNORMAL LOW (ref 0.50–1.10)
GFR calc Af Amer: >90 mL/min (ref >90)
GFR calc non Af Amer: >90 mL/min (ref >90)
Glucose, Bld: 147 mg/dL — ABNORMAL HIGH (ref 70–99)
Potassium: 3.9 mEq/L (ref 3.5–5.1)

## 2013-02-23 LAB — PROTIME-INR
INR: 1.5 — ABNORMAL HIGH (ref 0.00–1.49)
Prothrombin Time: 17.7 seconds — ABNORMAL HIGH (ref 11.6–15.2)

## 2013-02-23 SURGERY — SPLENECTOMY
Anesthesia: General | Site: Abdomen | Wound class: Clean Contaminated

## 2013-02-23 MED ORDER — LACTATED RINGERS IV SOLN
INTRAVENOUS | Status: DC
  Administered 2013-02-23: 1000 mL via INTRAVENOUS
  Administered 2013-02-23: 10:00:00 via INTRAVENOUS

## 2013-02-23 MED ORDER — FLUOXETINE HCL 40 MG PO CAPS: 40.0000 mg | ORAL_CAPSULE | Freq: Every day | ORAL | Status: DC

## 2013-02-23 MED ORDER — MEPERIDINE HCL 50 MG/ML IJ SOLN
6.2500 mg | INTRAMUSCULAR | Status: DC | PRN
  Administered 2013-02-23: 6.25 mg via INTRAVENOUS

## 2013-02-23 MED ORDER — CEFAZOLIN SODIUM-DEXTROSE 2-3 GM-% IV SOLR
2.0000 g | INTRAVENOUS | Status: AC
  Administered 2013-02-23: 2 g via INTRAVENOUS

## 2013-02-23 MED ORDER — HYDROMORPHONE HCL PF 1 MG/ML IJ SOLN
INTRAMUSCULAR | Status: DC | PRN
  Administered 2013-02-23: 0.5 mg via INTRAVENOUS

## 2013-02-23 MED ORDER — MIDAZOLAM HCL 5 MG/5ML IJ SOLN
INTRAMUSCULAR | Status: DC | PRN
  Administered 2013-02-23 (×2): 1 mg via INTRAVENOUS

## 2013-02-23 MED ORDER — DIPHENHYDRAMINE HCL 12.5 MG/5ML PO ELIX: 12.5000 mg | ORAL_SOLUTION | Freq: Four times a day (QID) | ORAL | Status: DC | PRN

## 2013-02-23 MED ORDER — PANTOPRAZOLE SODIUM 40 MG IV SOLR
40.0000 mg | Freq: Two times a day (BID) | INTRAVENOUS | Status: DC
  Administered 2013-02-23 – 2013-02-24 (×4): 40 mg via INTRAVENOUS
  Filled 2013-02-23 (×8): qty 40

## 2013-02-23 MED ORDER — PROMETHAZINE HCL 25 MG/ML IJ SOLN: 6.2500 mg | INTRAMUSCULAR | Status: DC | PRN

## 2013-02-23 MED ORDER — HYDROMORPHONE 0.3 MG/ML IV SOLN
INTRAVENOUS | Status: DC
  Administered 2013-02-23: 0.3 mg via INTRAVENOUS
  Administered 2013-02-23: 0.9 mg via INTRAVENOUS
  Administered 2013-02-24 (×2): 0.6 mg via INTRAVENOUS
  Administered 2013-02-24: 0.3 mg via INTRAVENOUS
  Administered 2013-02-24: 1.26 mg via INTRAVENOUS
  Administered 2013-02-24: 0.3 mg via INTRAVENOUS
  Administered 2013-02-24: 20:00:00 via INTRAVENOUS
  Administered 2013-02-24: 0.6 mg via INTRAVENOUS
  Administered 2013-02-25: 2.58 mg via INTRAVENOUS
  Filled 2013-02-23: qty 25

## 2013-02-23 MED ORDER — LIDOCAINE HCL (CARDIAC) 20 MG/ML IV SOLN
INTRAVENOUS | Status: DC | PRN
  Administered 2013-02-23: 50 mg via INTRAVENOUS

## 2013-02-23 MED ORDER — ONDANSETRON HCL 4 MG PO TABS: 4.0000 mg | ORAL_TABLET | Freq: Four times a day (QID) | ORAL | Status: DC | PRN

## 2013-02-23 MED ORDER — LACTATED RINGERS IV SOLN: INTRAVENOUS | Status: DC

## 2013-02-23 MED ORDER — CHLORHEXIDINE GLUCONATE 4 % EX LIQD
1.0000 "application " | Freq: Once | CUTANEOUS | Status: DC
  Filled 2013-02-23: qty 15

## 2013-02-23 MED ORDER — LACTATED RINGERS IV SOLN
INTRAVENOUS | Status: DC | PRN
  Administered 2013-02-23: 08:00:00 via INTRAVENOUS

## 2013-02-23 MED ORDER — NEOSTIGMINE METHYLSULFATE 1 MG/ML IJ SOLN
INTRAMUSCULAR | Status: DC | PRN
  Administered 2013-02-23: 3 mg via INTRAVENOUS

## 2013-02-23 MED ORDER — GLYCOPYRROLATE 0.2 MG/ML IJ SOLN
INTRAMUSCULAR | Status: DC | PRN
  Administered 2013-02-23: 0.4 mg via INTRAVENOUS

## 2013-02-23 MED ORDER — CHLORHEXIDINE GLUCONATE 0.12 % MT SOLN
15.0000 mL | Freq: Two times a day (BID) | OROMUCOSAL | Status: DC
  Administered 2013-02-23 – 2013-02-28 (×9): 15 mL via OROMUCOSAL
  Filled 2013-02-23 (×13): qty 15

## 2013-02-23 MED ORDER — MESALAMINE 1.2 G PO TBEC
1.2000 g | DELAYED_RELEASE_TABLET | Freq: Every day | ORAL | Status: DC
  Administered 2013-02-24 – 2013-02-28 (×5): 1.2 g via ORAL
  Filled 2013-02-23 (×8): qty 1

## 2013-02-23 MED ORDER — ONDANSETRON HCL 4 MG/2ML IJ SOLN
INTRAMUSCULAR | Status: DC | PRN
  Administered 2013-02-23: 4 mg via INTRAVENOUS

## 2013-02-23 MED ORDER — PENTOSAN POLYSULFATE SODIUM 100 MG PO CAPS
200.0000 mg | ORAL_CAPSULE | Freq: Two times a day (BID) | ORAL | Status: DC
  Administered 2013-02-24 – 2013-02-28 (×9): 200 mg via ORAL
  Filled 2013-02-23 (×12): qty 2

## 2013-02-23 MED ORDER — SODIUM CHLORIDE 0.9 % IV SOLN
INTRAVENOUS | Status: DC | PRN
  Administered 2013-02-23: 11:00:00 via INTRAVENOUS

## 2013-02-23 MED ORDER — CEFAZOLIN SODIUM-DEXTROSE 2-3 GM-% IV SOLR
2.0000 g | Freq: Three times a day (TID) | INTRAVENOUS | Status: AC
  Administered 2013-02-23 – 2013-02-24 (×3): 2 g via INTRAVENOUS
  Filled 2013-02-23 (×3): qty 50

## 2013-02-23 MED ORDER — ROCURONIUM BROMIDE 100 MG/10ML IV SOLN
INTRAVENOUS | Status: DC | PRN
  Administered 2013-02-23: 50 mg via INTRAVENOUS
  Administered 2013-02-23 (×3): 20 mg via INTRAVENOUS

## 2013-02-23 MED ORDER — BIOTENE DRY MOUTH MT LIQD
15.0000 mL | Freq: Two times a day (BID) | OROMUCOSAL | Status: DC
  Administered 2013-02-23 – 2013-02-27 (×8): 15 mL via OROMUCOSAL

## 2013-02-23 MED ORDER — FLUOXETINE HCL 20 MG PO CAPS
40.0000 mg | ORAL_CAPSULE | Freq: Every day | ORAL | Status: DC
  Administered 2013-02-24 – 2013-02-28 (×5): 40 mg via ORAL
  Filled 2013-02-23 (×6): qty 2

## 2013-02-23 MED ORDER — PROPOFOL 10 MG/ML IV BOLUS
INTRAVENOUS | Status: DC | PRN
  Administered 2013-02-23: 120 mg via INTRAVENOUS

## 2013-02-23 MED ORDER — ONDANSETRON HCL 4 MG/2ML IJ SOLN: 4.0000 mg | Freq: Four times a day (QID) | INTRAMUSCULAR | Status: DC | PRN

## 2013-02-23 MED ORDER — ONDANSETRON HCL 4 MG/2ML IJ SOLN
4.0000 mg | Freq: Four times a day (QID) | INTRAMUSCULAR | Status: DC | PRN
Start: 2013-02-23 — End: 2013-02-23

## 2013-02-23 MED ORDER — KETAMINE HCL 50 MG/ML IJ SOLN
INTRAMUSCULAR | Status: DC | PRN
  Administered 2013-02-23: 15 mg via INTRAMUSCULAR
  Administered 2013-02-23 (×2): 10 mg via INTRAMUSCULAR
  Administered 2013-02-23: 15 mg via INTRAMUSCULAR

## 2013-02-23 MED ORDER — SODIUM CHLORIDE 0.9 % IJ SOLN: 9.0000 mL | INTRAMUSCULAR | Status: DC | PRN

## 2013-02-23 MED ORDER — POTASSIUM CHLORIDE IN NACL 20-0.9 MEQ/L-% IV SOLN
INTRAVENOUS | Status: DC
  Administered 2013-02-23 – 2013-02-26 (×5): via INTRAVENOUS
  Filled 2013-02-23 (×7): qty 1000

## 2013-02-23 MED ORDER — TRAZODONE 25 MG HALF TABLET
25.0000 mg | ORAL_TABLET | Freq: Every day | ORAL | Status: DC
  Administered 2013-02-24 – 2013-02-27 (×4): 25 mg via ORAL
  Filled 2013-02-23 (×6): qty 1

## 2013-02-23 MED ORDER — DIPHENHYDRAMINE HCL 50 MG/ML IJ SOLN: 12.5000 mg | Freq: Four times a day (QID) | INTRAMUSCULAR | Status: DC | PRN

## 2013-02-23 MED ORDER — FENTANYL CITRATE 0.05 MG/ML IJ SOLN
INTRAMUSCULAR | Status: DC | PRN
Start: 2013-02-23 — End: 2013-02-23
  Administered 2013-02-23: 100 ug via INTRAVENOUS
  Administered 2013-02-23 (×3): 50 ug via INTRAVENOUS

## 2013-02-23 MED ORDER — NALOXONE HCL 0.4 MG/ML IJ SOLN: 0.4000 mg | INTRAMUSCULAR | Status: DC | PRN

## 2013-02-23 MED ORDER — FENTANYL CITRATE 0.05 MG/ML IJ SOLN: 25.0000 ug | INTRAMUSCULAR | Status: DC | PRN

## 2013-02-23 MED ORDER — ARIPIPRAZOLE 10 MG PO TABS
10.0000 mg | ORAL_TABLET | Freq: Every day | ORAL | Status: DC
  Administered 2013-02-24 – 2013-02-27 (×4): 10 mg via ORAL
  Filled 2013-02-23 (×6): qty 1

## 2013-02-23 SURGICAL SUPPLY — 52 items
APPLICATOR COTTON TIP 6IN STRL (MISCELLANEOUS) ×4 IMPLANT
BLADE EXTENDED COATED 6.5IN (ELECTRODE) ×2 IMPLANT
BLADE HEX COATED 2.75 (ELECTRODE) ×2 IMPLANT
BLADE SURG SZ10 CARB STEEL (BLADE) ×2 IMPLANT
CANISTER SUCTION 2500CC (MISCELLANEOUS) ×2 IMPLANT
CLIP TI LARGE 6 (CLIP) ×8 IMPLANT
CLOTH BEACON ORANGE TIMEOUT ST (SAFETY) ×2 IMPLANT
COVER MAYO STAND STRL (DRAPES) ×2 IMPLANT
CUTTER LINEAR FLEX ECHELON 45 (STAPLE) ×2 IMPLANT
DISSECTOR ROUND CHERRY 3/8 STR (MISCELLANEOUS) ×1 IMPLANT
DRAIN CHANNEL 19F RND (DRAIN) ×2 IMPLANT
DRAPE LAPAROSCOPIC ABDOMINAL (DRAPES) ×2 IMPLANT
DRAPE LG THREE QUARTER DISP (DRAPES) ×2 IMPLANT
DRAPE WARM FLUID 44X44 (DRAPE) ×2 IMPLANT
ELECT REM PT RETURN 9FT ADLT (ELECTROSURGICAL) ×2
ELECTRODE REM PT RTRN 9FT ADLT (ELECTROSURGICAL) ×1 IMPLANT
EVACUATOR 1/8 PVC DRAIN (DRAIN) ×2 IMPLANT
GLOVE BIOGEL PI IND STRL 7.0 (GLOVE) ×1 IMPLANT
GLOVE BIOGEL PI INDICATOR 7.0 (GLOVE) ×1
GLOVE EUDERMIC 7 POWDERFREE (GLOVE) ×6 IMPLANT
GOWN STRL NON-REIN LRG LVL3 (GOWN DISPOSABLE) IMPLANT
GOWN STRL REIN XL XLG (GOWN DISPOSABLE) ×12 IMPLANT
HEMOSTAT SURGICEL 4X8 (HEMOSTASIS) ×2 IMPLANT
KIT BASIN OR (CUSTOM PROCEDURE TRAY) ×2 IMPLANT
LEGGING LITHOTOMY PAIR STRL (DRAPES) ×2 IMPLANT
LIGASURE IMPACT 36 18CM CVD LR (INSTRUMENTS) ×2 IMPLANT
NS IRRIG 1000ML POUR BTL (IV SOLUTION) ×4 IMPLANT
PACK GENERAL/GYN (CUSTOM PROCEDURE TRAY) ×2 IMPLANT
RELOAD WH ECHELON 45 (STAPLE) ×2 IMPLANT
SCALPEL HARMONIC ACE (MISCELLANEOUS) IMPLANT
SPONGE GAUZE 4X4 12PLY (GAUZE/BANDAGES/DRESSINGS) ×4 IMPLANT
STAPLER VISISTAT 35W (STAPLE) ×2 IMPLANT
SUCTION POOLE TIP (SUCTIONS) ×2 IMPLANT
SUT ETHILON 2 0 PSLX (SUTURE) ×2 IMPLANT
SUT NOV 1 T60/GS (SUTURE) ×2 IMPLANT
SUT NOVA NAB DX-16 0-1 5-0 T12 (SUTURE) IMPLANT
SUT NOVA T20/GS 25 (SUTURE) IMPLANT
SUT PDS AB 1 CTX 36 (SUTURE) ×8 IMPLANT
SUT PDS AB 1 TP1 96 (SUTURE) IMPLANT
SUT PROLENE 2 0 KS (SUTURE) IMPLANT
SUT SILK 2 0 (SUTURE) ×2
SUT SILK 2 0 SH CR/8 (SUTURE) ×2 IMPLANT
SUT SILK 2 0SH CR/8 30 (SUTURE) IMPLANT
SUT SILK 2-0 18XBRD TIE 12 (SUTURE) ×1 IMPLANT
SUT SILK 2-0 30XBRD TIE 12 (SUTURE) IMPLANT
SUT SILK 3 0 (SUTURE) ×2
SUT SILK 3 0 SH CR/8 (SUTURE) ×2 IMPLANT
SUT SILK 3-0 18XBRD TIE 12 (SUTURE) ×1 IMPLANT
TAPE CLOTH SURG 4X10 WHT LF (GAUZE/BANDAGES/DRESSINGS) ×2 IMPLANT
TOWEL OR 17X26 10 PK STRL BLUE (TOWEL DISPOSABLE) ×4 IMPLANT
TRAY FOLEY CATH 14FRSI W/METER (CATHETERS) ×2 IMPLANT
YANKAUER SUCT BULB TIP NO VENT (SUCTIONS) ×2 IMPLANT

## 2013-02-23 NOTE — Interval H&P Note (Signed)
History and Physical Interval Note:  02/23/2013 7:51 AM  Sabrina [name]  has presented today for surgery, with the diagnosis of GALLSTONES, NEUTROPENIC SPLENOMEGALY  The goals and the various methods of treatment have been discussed with the patient and family. After consideration of risks, benefits and other options for treatment, the patient has consented to  Procedure(s): OPEN SPLENECTOMY AND CHOLECYSTECTOMY  (N/A) as a surgical intervention .  The patient's history has been reviewed, patient examined today, no change in status, stable for surgery.  I have reviewed the patient's chart and labs.  Questions were answered to the patient's satisfaction.     [name]

## 2013-02-23 NOTE — Anesthesia Preprocedure Evaluation (Addendum)
Anesthesia Evaluation  Patient identified by MRN, date of birth, ID band Patient awake    Reviewed: Allergy & Precautions, H&P , NPO status , Patient's Chart, lab work & pertinent test results  History of Anesthesia Complications Negative for: history of anesthetic complications  Airway Mallampati: II TM Distance: >3 FB Neck ROM: Full    Dental  (+) Teeth Intact and Dental Advisory Given   Pulmonary neg pulmonary ROS,    Pulmonary exam normal       Cardiovascular hypertension, Pt. on medications - Cardiac Defibrillator - Valvular Problems/MurmursAI Rhythm:Regular Rate:Normal     Neuro/Psych PSYCHIATRIC DISORDERS Anxiety Depression Bipolar Disorder negative neurological ROS     GI/Hepatic negative GI ROS, Neg liver ROS, PUD, LFT's elevated Celiac disease   Endo/Other  diabetes, Type 2, Oral Hypoglycemic Agents  Renal/GU negative Renal ROS  negative genitourinary   Musculoskeletal negative musculoskeletal ROS (+)   Abdominal   Peds  Hematology negative hematology ROS (+) Blood dyscrasia, , Thrombocytopenia neutropenia    Anesthesia Other Findings   Reproductive/Obstetrics negative OB ROS                          Anesthesia Physical  Anesthesia Plan  ASA: III  Anesthesia Plan: General   Post-op Pain Management:    Induction: Intravenous  Airway Management Planned: Oral ETT  Additional Equipment:   Intra-op Plan:   Post-operative Plan: Extubation in OR  Informed Consent: I have reviewed the patients History and Physical, chart, labs and discussed the procedure including the risks, benefits and alternatives for the proposed anesthesia with the patient or authorized representative who has indicated his/her understanding and acceptance.   Dental advisory given  Plan Discussed with: CRNA  Anesthesia Plan Comments:         Anesthesia Quick Evaluation                                   Anesthesia Evaluation  Patient identified by MRN, date of birth, ID band Patient awake    Reviewed: Allergy & Precautions, H&P , NPO status , Patient's Chart, lab work & pertinent test results  History of Anesthesia Complications Negative for: history of anesthetic complications  Airway Mallampati: II TM Distance: >3 FB Neck ROM: Full    Dental  (+) Teeth Intact and Dental Advisory Given   Pulmonary neg pulmonary ROS,    Pulmonary exam normal       Cardiovascular hypertension, Pt. on medications + Cardiac Defibrillator + Valvular Problems/Murmurs AI Rhythm:Regular Rate:Normal     Neuro/Psych PSYCHIATRIC DISORDERS Bipolar Disorder negative neurological ROS     GI/Hepatic negative GI ROS, Neg liver ROS, Celiac disease   Endo/Other  Diabetes mellitus- (States DM has resolved with weight loss)  Renal/GU negative Renal ROS  negative genitourinary   Musculoskeletal negative musculoskeletal ROS (+)   Abdominal   Peds  Hematology negative hematology ROS (+)   Anesthesia Other Findings   Reproductive/Obstetrics negative OB ROS                           Anesthesia Physical Anesthesia Plan  ASA: II  Anesthesia Plan: General   Post-op Pain Management:    Induction: Intravenous  Airway Management Planned: LMA  Additional Equipment:   Intra-op Plan:   Post-operative Plan: Extubation in OR  Informed Consent: I have reviewed the patients History  and Physical, chart, labs and discussed the procedure including the risks, benefits and alternatives for the proposed anesthesia with the patient or authorized representative who has indicated his/her understanding and acceptance.   Dental advisory given  Plan Discussed with: CRNA  Anesthesia Plan Comments:         Anesthesia Quick Evaluation

## 2013-02-23 NOTE — Transfer of Care (Signed)
Immediate Anesthesia Transfer of Care Note  [name]  Procedure(s) Performed: Procedure(s): OPEN SPLENECTOMY AND CHOLECYSTECTOMY  (N/A)  Patient Location: PACU  Anesthesia Type:General  Level of Consciousness: awake, alert , oriented and patient cooperative  Airway & Oxygen Therapy: Patient Spontanous Breathing and Patient connected to face mask oxygen  Post-op Assessment: Report given to PACU RN and Post -op Vital signs reviewed and stable  Post vital signs: Reviewed and stable  Complications: No apparent anesthesia complications

## 2013-02-23 NOTE — Preoperative (Signed)
Beta Blockers   Reason not to administer Beta Blockers:Not Applicable 

## 2013-02-23 NOTE — Anesthesia Postprocedure Evaluation (Signed)
  Anesthesia Post-op Note  [name]  Procedure(s) Performed: Procedure(s) (LRB): OPEN SPLENECTOMY AND CHOLECYSTECTOMY  (N/A)  Patient Location: PACU  Anesthesia Type: General  Level of Consciousness: awake and alert   Airway and Oxygen Therapy: Patient Spontanous Breathing  Post-op Pain: mild  Post-op Assessment: Post-op Vital signs reviewed, Patient's Cardiovascular Status Stable, Respiratory Function Stable, Patent Airway and No signs of Nausea or vomiting  Last Vitals:  Filed Vitals:   02/23/13 1400  BP: 110/60  Pulse: 79  Temp:   Resp: 12    Post-op Vital Signs: stable   Complications: No apparent anesthesia complications

## 2013-02-23 NOTE — Op Note (Signed)
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 Derrell Lolling, M.D., FACS  Assistant:                      Glenna Fellows, M.D., FACS  Operative Indications:   Sabrina [name] is a 57 y.o. female. She is referred to me by Dr. Arlan Organ for consideration of splenectomy for neutropenic splenomegaly. She also has gallstones. Dr. [name] is her gastroenterologist. Dr. Neena Rhymes he is her primary care physician. Dr. Evelene Croon is her psychiatrist.  She was recently found to have neutropenia and thrombocytopenia. She was referred to Dr. Myna Hidalgo. Blood work showed hemoglobin 9.9, white blood cell count 2,000, platelet count 96,000, prothrombin time 17.7, INR 1.8. A CT scan was performed which shows no abnormalities in the chest except for slight cardiac enlargement. In the abdomen she was found to have significant splenomegaly with progression from 8.2 cm in 2006 to--20 cm in craniocaudal dimension. The stomach looked normal. There were some upper abdominal varices with recanalization of the umbilical vein. Numerous gallstones were noted. There is no mass or adenopathy noted.  She was recently also diagnosed with an antral ulcer. That was treated by [name].. Followup endoscopy on July 14 showed substantial healing of the ulcer but still with residual ulceration. Biopsies showed benign changes, and negative for H. Pylori. The GE junction and fundus looked normal. No varices were seen.  Dr. Myna Hidalgo performed a bone marrow which showed hypercellular bone marrow for age. Flow cytometry was negative. Correlation with cytogenetic studies was recommended. At this point Dr. Myna Hidalgo  referred her for splenectomy and discussed this with me. She has received all of her vaccines in his office.  Symptomatically she states that she was transfused with 3 units of packed cells in May for a hemoglobin of 4.6. She has some epigastric pain, left upper quadrant and right upper quadrant pain. She has early satiety and post prandial boating. She says she's had a 70 pound weight loss over the past 4 months.  She denies any history of liver disease. Specifically denies any history of alcohol abuse, hepatitis or cirrhosis.  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 She is brought to the operating room electively   Operative Findings:       The spleen was markedly enlarged, almost 25 cm in craniocaudal dimension. The surface of the spleen was smooth and the color was  normal and was homogenous. There were no nodules. There is no hilar adenopathy. Portal hypertension is suspected, especially in the retrogastric and gastrocolic position. The umbilical vein was also patent and was quite large. There were numerous palpable gallstones in the gallbladder but the gallbladder was not inflamed. The anatomy of the cystic duct and cystic artery and common bile duct appeared conventional. The liver was diseased, was hard and micronodular in appearance. There were some clear ascites, probably about 500 cc - 1000 cc which was evacuated.  The ascites was not excessive. The stomach, large bowel, and small bowel were otherwise grossly normal to inspection.  Procedure in Detail:          Following the induction of general endotracheal anesthesia, a Foley catheter was inserted, intravenous antibiotics were given, the abdomen was prepped and draped in sterile fashion, and a surgical time out was performed. A left subcostal incision was made and it was extended across the midline slightly, probably about 4 cm to facilitate cholecystectomy. The anterior rectus sheath, rectus muscles, and  posterior rectus sheath were carefully divided with cautery. There was more bleeding than normal, suggesting a mild coagulopathy, and she was given 2 units of fresh frozen plasma.  Self-retaining retractors were placed. The abdomen was explored with findings as described above. I took down the splenic flexure carefully with sharp dissection and electrocautery and scissor dissection. I mobilized the lower pole of the spleen from its inferior and lateral attachments. i slowly divided the gastrocolic omentum with the ligasure device. I tied a couple of larger vessels. I took this dissection up  to the upper pole of the spleen. At this point in time the spleen was somewhat more mobile but with a very generous hilum with large venous structures. I slowly dissected out the hilar vessels. I had good visualization of the pancreatic tail and the tip of the pancreas. Isolated an accessory artery to the spleen, ligated in continuity with 0 silk ties and divided it. I then  isolated the main splenic artery, created a large window and ligated it in continuity with triple 0 silk ties. Some splenic vein branches were isolated clamped divided and ligated with a 2-0 silk ties. The main splenic vein was quite large it was dissected out close to the hilum of the spleen and isolated. The medial aspect of the splenic vein  was tied off with a 0 silk tie. The splenic vein was then divided with a Echelon stapling device with 2.5 mm staple height. This worked quite well. We then completed the dissection of the hilum very carefully. There was no evidence of injury to the  pancreatic tail or tip. The specimen was removed. Hemostasis was very good. I did cauterize the edge of the peritoneum and clipped off a few more small vessels but basically after irrigating everything looked good. I packed off the left upper quadrant.  I shifted the  the exposure over to the right upper quadrant visualizing the right lobe of the liver and gallbladder.  The gallbladder looked favorable for dissection. I placed a pack behind the liver. The gallbladder was elevated with clamps. I incised the peritoneum on the lower neck of the gallbladder and slowly dissected out the infundibulum of the gallbladder.  I  Isolated the cystic artery as it went onto the wall of the gallbladder and divided it  between metal clips. I then isolated the cystic duct with a large window behind the cystic duct and secured it with multiple metal clips and divided it. The gallbladder was dissected from its bed with electrocautery, removed and sent to pathology as well. Hemostasis was very good and achieved with  electrocautery. I irrigated the right upper quadrant. i placed a large piece of Surgicel gauze in the gallbladder bed and packed things off. I then went back to the left upper quadrant and removed all the packs. A few minor bleeders were cauterized. I placed a piece of Surgicel gauze up in a retrogastric position and there did not appear to  be any more bleeding there or in the subhepatic space. A 19 [place] Blake drain was placed in the left subphrenic space under left lobe of the liver, brought out through separate stab incision in the left flank,  sutured to the skin with a nylon suture and connected to a suction bulb.  I then closed the midline wound in layers. The posterior rectus sheath on the right was closed with a running suture of #1 PDS. The posterior rectus sheath the left was closed with a running suture of #1 PDS. The linea alba was closed with a figure-of-eight suture of #1 Novofil. The anterior rectus sheath on the right, and the anterior rectus sheath the left were closed with separate running sutures of #1 PDS. After irrigating the subcutaneous tissue the skin was closed with skin staples. A clean bandage was placed. The patient tolerated the procedure well was taken to recovery in stable condition. EBL 200 250 cc. Counts correct. Complications none.     Angelia Mould.  Derrell Lolling, M.D., FACS General and Minimally Invasive Surgery Breast and Colorectal Surgery  02/23/2013 11:21 AM

## 2013-02-24 ENCOUNTER — Encounter (HOSPITAL_COMMUNITY): Payer: Self-pay | Admitting: General Surgery

## 2013-02-24 LAB — APTT: aPTT: 34 seconds (ref 24–37)

## 2013-02-24 LAB — HEPATITIS PANEL, ACUTE
HCV Ab: NEGATIVE
Hepatitis B Surface Ag: NEGATIVE

## 2013-02-24 LAB — CBC
Hemoglobin: 10.6 g/dL — ABNORMAL LOW (ref 12.0–15.0)
MCH: 28.6 pg (ref 26.0–34.0)
MCHC: 31.8 g/dL (ref 30.0–36.0)
Platelets: 154 10*3/uL (ref 150–400)
RDW: 20.5 % — ABNORMAL HIGH (ref 11.5–15.5)

## 2013-02-24 LAB — PREPARE FRESH FROZEN PLASMA: Unit division: 0

## 2013-02-24 LAB — COMPREHENSIVE METABOLIC PANEL
AST: 68 U/L — ABNORMAL HIGH (ref 0–37)
BUN: 9 mg/dL (ref 6–23)
CO2: 25 mEq/L (ref 19–32)
Calcium: 8.3 mg/dL — ABNORMAL LOW (ref 8.4–10.5)
Chloride: 105 mEq/L (ref 96–112)
Creatinine, Ser: 0.43 mg/dL — ABNORMAL LOW (ref 0.50–1.10)
GFR calc non Af Amer: >90 mL/min (ref >90)
Total Bilirubin: 1 mg/dL (ref 0.3–1.2)

## 2013-02-24 LAB — PROTIME-INR
INR: 1.59 — ABNORMAL HIGH (ref 0.00–1.49)
Prothrombin Time: 18.5 seconds — ABNORMAL HIGH (ref 11.6–15.2)

## 2013-02-24 NOTE — Progress Notes (Signed)
INITIAL NUTRITION ASSESSMENT  Pt meets criteria for severe MALNUTRITION in the context of chronic illness as evidenced by <75% estimated energy intake in the past month with 33% weight loss in the past 9 months per pt.  DOCUMENTATION CODES Per approved criteria  -Severe malnutrition in the context of chronic illness   INTERVENTION: - Diet advancement to gluten free diet per MD - Discussed ways to improve nutrition with early satiety including low fat/fiber small frequent meals - Will continue to monitor   NUTRITION DIAGNOSIS: Unintended weight loss related to early satiety/bloating as evidenced by pt report, weight trend.   Goal: 1. Advance diet as tolerated to gluten free diet 2. Stable weight  Monitor:  Weights, labs, diet advancement  Reason for Assessment: Nutrition risk   57 y.o. female  Admitting Dx: Neutropenic splenomegaly with gallstones  ASSESSMENT: Pt with history of antral ulcer, DM, HTN, Crohn's disease, Celiac disease, and bipolar disorder. Pt admitted with neutropenic splenomegaly and gallstones. POD# 1 splenectomy and cholecystectomy.   Met with pt who reports losing 70 pounds unintentionally since December 2013, 40 of which was lost since April of this year. Pt reports eating 2 well balanced meals/day at home, not on any nutritional supplements. Pt c/o early satiety and post prandial bloating. Pt on gluten free diet at home for Celiac's disease. Pt denies any nausea or pain today. Pt with elevated AST.   Height: Ht Readings from Last 1 Encounters:  02/23/13 5\' 5"  (1.651 m)    Weight: Wt Readings from Last 1 Encounters:  02/24/13 141 lb 8.6 oz (64.2 kg)    Ideal Body Weight: 125 lb  % Ideal Body Weight: 113%  Wt Readings from Last 10 Encounters:  02/24/13 141 lb 8.6 oz (64.2 kg)  02/24/13 141 lb 8.6 oz (64.2 kg)  02/16/13 131 lb 9.6 oz (59.693 kg)  01/28/13 139 lb (63.05 kg)  01/18/13 142 lb (64.411 kg)  01/11/13 139 lb (63.05 kg)  01/10/13 147  lb (66.679 kg)  12/27/12 142 lb (64.411 kg)  12/23/12 145 lb (65.772 kg)  12/02/12 147 lb 2 oz (66.735 kg)    Usual Body Weight: 211 lb in December 2013 per pt  % Usual Body Weight: 67%  BMI:  Body mass index is 23.55 kg/(m^2).  Estimated Nutritional Needs: Kcal: 1600-1900 Protein: 75-85g Fluid: 1.6-1.9L/day  Skin: Bilateral abdominal incision  Diet Order: Clear Liquid  EDUCATION NEEDS: -No education needs identified at this time   Intake/Output Summary (Last 24 hours) at 02/24/13 1352 Last data filed at 02/24/13 1315  Gross per 24 hour  Intake 2669.66 ml  Output   1120 ml  Net 1549.66 ml    Last BM: 8/26  Labs:   Recent Labs Lab 02/23/13 1208 02/24/13 0327  NA 136 135  K 3.9 3.8  CL 102 105  CO2 20 25  BUN 9 9  CREATININE 0.43* 0.43*  CALCIUM 8.6 8.3*  GLUCOSE 147* 133*    CBG (last 3)   Recent Labs  02/23/13 0655 02/23/13 1144  GLUCAP 98 132*    Scheduled Meds: . antiseptic oral rinse  15 mL Mouth Rinse q12n4p  . ARIPiprazole  10 mg Oral QHS  . chlorhexidine  15 mL Mouth Rinse BID  . FLUoxetine  40 mg Oral Daily  . HYDROmorphone PCA 0.3 mg/mL   Intravenous Q4H  . mesalamine  1.2 g Oral Q breakfast  . pantoprazole (PROTONIX) IV  40 mg Intravenous Q12H  . pentosan polysulfate  200 mg Oral  BID  . traZODone  25 mg Oral QHS    Continuous Infusions: . 0.9 % NaCl with KCl 20 mEq / L 100 mL/hr at 02/24/13 1610    Past Medical History  Diagnosis Date  . Arthritis   . Allergic rhinitis   . Colitis   . Degenerative disc disease   . Hyperlipidemia   . Hypertension   . Celiac disease   . Bipolar disorder   . Macular degeneration   . Interstitial cystitis   . Diabetes mellitus without complication   . Anemia, iron deficiency 10/30/2012  . Heart murmur   . Anxiety   . Depression     Past Surgical History  Procedure Laterality Date  . Tubal ligation    . Appendectomy    . Lumbar laminectomy    . Carpal tunnel release      bilateral   . Cesarean section    . Cysto/ hod/ instillatio clorpactin  07-03-2008  &  06-09-2001    INTERSTITIAL CYSTITIS  . Shoulder arthroscopy w/ rotator cuff repair  01-25-2008    LEFT SHOULDER /   INCLUDING CAPSULECTOMY WITH DEBRIDEMENT/  SAD WITH RESECTION OF AC  . Right shoulder arthroscopy/ open resection distal clavicle/ debridement adhesive capsulitis/ rotator cuff repair  08-30-2003  . Tonsillectomy and adenoidectomy    . Transthoracic echocardiogram  03-28-2011    NORMAL LVSF/ EF 60-65%/ VERY MILD AORTIC STENOSIS, TRIVIAL REGURG./ MILDLY DILATED LEFT ATRIUM  . Cystoscopy  11/26/2011    Procedure: CYSTOSCOPY;  Surgeon: Valetta Fuller, MD;  Location: [facility];  Service: Urology;  Laterality: N/A;  clorpactin  in bladder    Levon Hedger MS, RD, LDN [phone] Pager [phone] After Hours Pager

## 2013-02-24 NOTE — Care Management Note (Signed)
    Page 1 of 1   02/24/2013     11:32:11 AM   CARE MANAGEMENT NOTE 02/24/2013  Patient:  Sabrina [name], Sabrina [name]   Account Number:  [id number]  Date Initiated:  02/24/2013  Documentation initiated by:  Lorenda Ishihara  Subjective/Objective Assessment:   57 yo female admitted s/p splenectomy and cholectomy. PTA lived at home with spouse.     Action/Plan:   Home when stable   Anticipated DC Date:  02/24/2013   Anticipated DC Plan:  HOME/SELF CARE      DC Planning Services  CM consult      Choice offered to / List presented to:             Status of service:  Completed, signed off Medicare Important Message given?   (If response is "NO", the following Medicare IM given date fields will be blank) Date Medicare IM given:   Date Additional Medicare IM given:    Discharge Disposition:  HOME/SELF CARE  Per UR Regulation:  Reviewed for med. necessity/level of care/duration of stay  If discussed at Long Length of Stay Meetings, dates discussed:    Comments:

## 2013-02-24 NOTE — Progress Notes (Signed)
1 Day Post-Op  Subjective: Stable and overt. Pain control reasonably good with PCA. No nausea. No respiratory difficulties.  JP drainage not excessive, draining functioning, 200 cc out since PACU yesterday.  Hemoglobin 10.6, stable. WBC 7500. Bili count 154. INR 1.59, prothrombin time 18.5. Glucose 133. Albumin 2.3. AST 68, otherwise LFTs normal.  Operative findings discussed with patient.   Objective: Vital signs in last 24 hours: Temp:  [97.4 F (36.3 C)-99.7 F (37.6 C)] 98.7 F (37.1 C) (08/28 0400) Pulse Rate:  [74-85] 77 (08/28 0600) Resp:  [0-20] 7 (08/28 0600) BP: (82-126)/(42-60) 117/49 mmHg (08/28 0600) SpO2:  [93 %-100 %] 95 % (08/28 0600) Weight:  [138 lb 14.2 oz (63 kg)-141 lb 8.6 oz (64.2 kg)] 141 lb 8.6 oz (64.2 kg) (08/28 0400) Last BM Date: 02/22/13  Intake/Output from previous day: 08/27 0701 - 08/28 0700 In: 4992.1 [I.V.:4327.1; Blood:615; IV Piggyback:50] Out: 1375 [Urine:875; Drains:200; Blood:300] Intake/Output this shift: Total I/O In: 1375 [I.V.:1375] Out: 550 [Urine:450; Drains:100]  General appearance: Alert. Oriented. Mental status normal. Minimal distress. Resp: clear to auscultation bilaterally GI: soft. Appropriately tender. Not distended. Wound clean. JP drain functioning.  Lab Results:  Results for orders placed during the hospital encounter of 02/23/13 (from the past 24 hour(s))  GLUCOSE, CAPILLARY     Status: None   Collection Time    02/23/13  6:55 AM      Result Value Range   Glucose-Capillary 98  70 - 99 mg/dL   Comment 1 Documented in Chart    PREPARE FRESH FROZEN PLASMA     Status: None   Collection Time    02/23/13  9:10 AM      Result Value Range   Unit Number [id number]     Blood Component Type THAWED PLASMA     Unit division 00     Status of Unit ISSUED     Transfusion Status OK TO TRANSFUSE     Unit Number [id number]     Blood Component Type THAWED PLASMA     Unit division 00     Status of Unit ISSUED     Transfusion Status OK TO TRANSFUSE    PREPARE RBC (CROSSMATCH)     Status: None   Collection Time    02/23/13  9:15 AM      Result Value Range   Order Confirmation ORDER PROCESSED BY BLOOD BANK    GLUCOSE, CAPILLARY     Status: Abnormal   Collection Time    02/23/13 11:44 AM      Result Value Range   Glucose-Capillary 132 (*) 70 - 99 mg/dL   Comment 1 Documented in Chart     Comment 2 Notify RN    CBC     Status: Abnormal   Collection Time    02/23/13 12:08 PM      Result Value Range   WBC 3.1 (*) 4.0 - 10.5 K/uL   RBC 4.10  3.87 - 5.11 MIL/uL   Hemoglobin 11.7 (*) 12.0 - 15.0 g/dL   HCT 30.8  65.7 - 84.6 %   MCV 88.3  78.0 - 100.0 fL   MCH 28.5  26.0 - 34.0 pg   MCHC 32.3  30.0 - 36.0 g/dL   RDW 96.2 (*) 95.2 - 84.1 %   Platelets 131 (*) 150 - 400 K/uL  PROTIME-INR     Status: Abnormal   Collection Time    02/23/13 12:08 PM      Result Value Range  Prothrombin Time 17.7 (*) 11.6 - 15.2 seconds   INR 1.50 (*) 0.00 - 1.49  APTT     Status: None   Collection Time    02/23/13 12:08 PM      Result Value Range   aPTT 30  24 - 37 seconds  BASIC METABOLIC PANEL     Status: Abnormal   Collection Time    02/23/13 12:08 PM      Result Value Range   Sodium 136  135 - 145 mEq/L   Potassium 3.9  3.5 - 5.1 mEq/L   Chloride 102  96 - 112 mEq/L   CO2 20  19 - 32 mEq/L   Glucose, Bld 147 (*) 70 - 99 mg/dL   BUN 9  6 - 23 mg/dL   Creatinine, Ser 4.09 (*) 0.50 - 1.10 mg/dL   Calcium 8.6  8.4 - 81.1 mg/dL   GFR calc non Af Amer >90  >90 mL/min   GFR calc Af Amer >90  >90 mL/min  COMPREHENSIVE METABOLIC PANEL     Status: Abnormal   Collection Time    02/24/13  3:27 AM      Result Value Range   Sodium 135  135 - 145 mEq/L   Potassium 3.8  3.5 - 5.1 mEq/L   Chloride 105  96 - 112 mEq/L   CO2 25  19 - 32 mEq/L   Glucose, Bld 133 (*) 70 - 99 mg/dL   BUN 9  6 - 23 mg/dL   Creatinine, Ser 9.14 (*) 0.50 - 1.10 mg/dL   Calcium 8.3 (*) 8.4 - 10.5 mg/dL   Total Protein 6.9  6.0 - 8.3  g/dL   Albumin 2.3 (*) 3.5 - 5.2 g/dL   AST 68 (*) 0 - 37 U/L   ALT 29  0 - 35 U/L   Alkaline Phosphatase 85  39 - 117 U/L   Total Bilirubin 1.0  0.3 - 1.2 mg/dL   GFR calc non Af Amer >90  >90 mL/min   GFR calc Af Amer >90  >90 mL/min  APTT     Status: None   Collection Time    02/24/13  3:27 AM      Result Value Range   aPTT 34  24 - 37 seconds  PROTIME-INR     Status: Abnormal   Collection Time    02/24/13  3:27 AM      Result Value Range   Prothrombin Time 18.5 (*) 11.6 - 15.2 seconds   INR 1.59 (*) 0.00 - 1.49  CBC     Status: Abnormal   Collection Time    02/24/13  3:27 AM      Result Value Range   WBC 7.5  4.0 - 10.5 K/uL   RBC 3.70 (*) 3.87 - 5.11 MIL/uL   Hemoglobin 10.6 (*) 12.0 - 15.0 g/dL   HCT 78.2 (*) 95.6 - 21.3 %   MCV 90.0  78.0 - 100.0 fL   MCH 28.6  26.0 - 34.0 pg   MCHC 31.8  30.0 - 36.0 g/dL   RDW 08.6 (*) 57.8 - 46.9 %   Platelets 154  150 - 400 K/uL     Studies/Results: @RISRSLT24 @  . antiseptic oral rinse  15 mL Mouth Rinse q12n4p  . ARIPiprazole  10 mg Oral QHS  .  ceFAZolin (ANCEF) IV  2 g Intravenous Q8H  . chlorhexidine  15 mL Mouth Rinse BID  . FLUoxetine  40 mg Oral Daily  .  HYDROmorphone PCA 0.3 mg/mL   Intravenous Q4H  . mesalamine  1.2 g Oral Q breakfast  . pantoprazole (PROTONIX) IV  40 mg Intravenous Q12H  . pentosan polysulfate  200 mg Oral BID  . traZODone  25 mg Oral QHS     Assessment/Plan: s/p Procedure(s): OPEN SPLENECTOMY AND CHOLECYSTECTOMY  POD #1. Stable. Discontinue Foley Transfer to the floor Sips of clear liquids Mobilize Incentive spirometry  Hepatocellular disease and portal hypertension diagnosed by gross findings at surgery. Question whether splenomegaly could be secondary to this.  Hepatitis panel requested, but she has never had an event suggesting hepatitis.  No history of excessive alcohol intake. Findings discussed with patient and with Dr. Myna Hidalgo.Check pathology.  History of recent gastric  antral ulcer, healing. Continue twice a day proton pump inhibitors  Diet-controlled diabetes, glucose well controlled at present. Monitor` Hypertension History Crohn's disease-continue mesalamine. History celiac disease History of bipolar disorder-continue Abilify, Prozac,desyrel  Suspect protein calorie malnutrition  and significant weight loss secondary to decreased intake. Hopefully oral intake will improve now that splenic compression of stomach and gallbladder are out of the picture.    @PROBHOSP @  LOS: 1 day    Onix Jumper M. Derrell Lolling, M.D., [facility] General and Minimally invasive Surgery Breast and Colorectal Surgery Office:   [phone] Pager:   [phone]  02/24/2013  . .prob

## 2013-02-25 DIAGNOSIS — E43 Unspecified severe protein-calorie malnutrition: Secondary | ICD-10-CM | POA: Insufficient documentation

## 2013-02-25 LAB — CBC
Hemoglobin: 10.8 g/dL — ABNORMAL LOW (ref 12.0–15.0)
MCH: 29.2 pg (ref 26.0–34.0)
MCHC: 32 g/dL (ref 30.0–36.0)
Platelets: 164 10*3/uL (ref 150–400)
RDW: 20.8 % — ABNORMAL HIGH (ref 11.5–15.5)

## 2013-02-25 LAB — PREPARE PLATELET PHERESIS: Unit division: 0

## 2013-02-25 LAB — PROTIME-INR: INR: 1.61 — ABNORMAL HIGH (ref 0.00–1.49)

## 2013-02-25 LAB — BASIC METABOLIC PANEL
Calcium: 8.4 mg/dL (ref 8.4–10.5)
GFR calc Af Amer: >90 mL/min (ref >90)
GFR calc non Af Amer: >90 mL/min (ref >90)
Glucose, Bld: 119 mg/dL — ABNORMAL HIGH (ref 70–99)
Sodium: 135 mEq/L (ref 135–145)

## 2013-02-25 LAB — CERULOPLASMIN: Ceruloplasmin: 29 mg/dL (ref 20–60)

## 2013-02-25 LAB — FACTOR 8 ASSAY: Coagulation Factor VIII: 116 % (ref 73–140)

## 2013-02-25 MED ORDER — BISACODYL 10 MG RE SUPP: 10.0000 mg | Freq: Once | RECTAL | Status: DC

## 2013-02-25 MED ORDER — MORPHINE SULFATE 2 MG/ML IJ SOLN
1.0000 mg | INTRAMUSCULAR | Status: DC | PRN
  Administered 2013-02-25 (×3): 2 mg via INTRAVENOUS
  Filled 2013-02-25 (×3): qty 1

## 2013-02-25 MED ORDER — OXYCODONE-ACETAMINOPHEN 5-325 MG PO TABS
1.0000 | ORAL_TABLET | ORAL | Status: DC | PRN
  Administered 2013-02-25 – 2013-02-28 (×14): 2 via ORAL
  Filled 2013-02-25 (×15): qty 2

## 2013-02-25 MED ORDER — PANTOPRAZOLE SODIUM 40 MG PO TBEC
40.0000 mg | DELAYED_RELEASE_TABLET | Freq: Two times a day (BID) | ORAL | Status: DC
  Administered 2013-02-25 – 2013-02-28 (×7): 40 mg via ORAL
  Filled 2013-02-25 (×8): qty 1

## 2013-02-25 NOTE — Progress Notes (Addendum)
2 Days Post-Op  Subjective: Stable and alert. A little bit lethargic from the pain medicine. Complains of incisional pain. Tolerating clear liquid diet. No shortness of breath or cough. Voiding uneventfully. Getting up in chair.  Hemoglobin 10.8, stable. Platelet count 164.  WBC 8900. BUN 8. Creatinine 0.43. Glucose 119.  Hepatitis panel negative. Rest of hepatic workup in process, testing is being directed by Dr. Myna Hidalgo.  Objective: Vital signs in last 24 hours: Temp:  [98 F (36.7 C)-99.4 F (37.4 C)] 98.2 F (36.8 C) (08/29 0503) Pulse Rate:  [68-85] 68 (08/29 0503) Resp:  [10-18] 10 (08/29 0503) BP: (100-138)/(46-71) 109/66 mmHg (08/29 0503) SpO2:  [93 %-100 %] 100 % (08/29 0503) Last BM Date: 02/22/13  Intake/Output from previous day: 08/28 0701 - 08/29 0700 In: 2742 [P.O.:480; I.V.:2262] Out: 940 [Urine:825; Drains:115] Intake/Output this shift: Total I/O In: 1216.7 [I.V.:1216.7] Out: 345 [Urine:300; Drains:45]  General appearance: alert and appropriate. No distress. A little sedated from PCA Dilaudid. Skin warm and dry Resp: clear to auscultation bilaterally GI: abdomen is soft and nondistended. Hypoactive bowel sounds. Wound clean.  JP drainage serosanguineous. Low volume.  Lab Results:  Results for orders placed during the hospital encounter of 02/23/13 (from the past 24 hour(s))  CBC     Status: Abnormal   Collection Time    02/25/13  5:00 AM      Result Value Range   WBC 8.9  4.0 - 10.5 K/uL   RBC 3.70 (*) 3.87 - 5.11 MIL/uL   Hemoglobin 10.8 (*) 12.0 - 15.0 g/dL   HCT 16.1 (*) 09.6 - 04.5 %   MCV 91.4  78.0 - 100.0 fL   MCH 29.2  26.0 - 34.0 pg   MCHC 32.0  30.0 - 36.0 g/dL   RDW 40.9 (*) 81.1 - 91.4 %   Platelets 164  150 - 400 K/uL  BASIC METABOLIC PANEL     Status: Abnormal   Collection Time    02/25/13  5:00 AM      Result Value Range   Sodium 135  135 - 145 mEq/L   Potassium 3.9  3.5 - 5.1 mEq/L   Chloride 108  96 - 112 mEq/L   CO2 24  19 - 32  mEq/L   Glucose, Bld 119 (*) 70 - 99 mg/dL   BUN 8  6 - 23 mg/dL   Creatinine, Ser 7.82 (*) 0.50 - 1.10 mg/dL   Calcium 8.4  8.4 - 95.6 mg/dL   GFR calc non Af Amer >90  >90 mL/min   GFR calc Af Amer >90  >90 mL/min  PROTIME-INR     Status: Abnormal   Collection Time    02/25/13  5:00 AM      Result Value Range   Prothrombin Time 18.7 (*) 11.6 - 15.2 seconds   INR 1.61 (*) 0.00 - 1.49     Studies/Results: @RISRSLT24 @  . antiseptic oral rinse  15 mL Mouth Rinse q12n4p  . ARIPiprazole  10 mg Oral QHS  . bisacodyl  10 mg Rectal Once  . chlorhexidine  15 mL Mouth Rinse BID  . FLUoxetine  40 mg Oral Daily  . mesalamine  1.2 g Oral Q breakfast  . pantoprazole  40 mg Oral BID  . pentosan polysulfate  200 mg Oral BID  . traZODone  25 mg Oral QHS     Assessment/Plan: s/p Procedure(s): OPEN SPLENECTOMY AND CHOLECYSTECTOMY   POD #2. Stable.  Advanced to full liquid diet. Push ambulation in  the hall today.  Incentive spirometry  Drain out prior to discharge.  Hepatocellular disease and portal hypertension suspected  by gross findings at surgery. Question whether splenomegaly could be secondary to this. Hepatitis panel negative., numerous other serologic tests pending. No history of excessive alcohol intake. Findings discussed with patient and with Dr. Myna Hidalgo.Check pathology.   History of recent gastric antral ulcer, healing. Continue twice a day proton pump inhibitors. Switch to po.  Diet-controlled diabetes, glucose well controlled at present. Monitor`  Hypertension  History Crohn's disease-continue mesalamine.  History celiac disease  History of bipolar disorder-continue Abilify, Prozac,desyrel  Severe protein calorie malnutrition and significant weight loss secondary to decreased intake. Hopefully oral intake will improve now that splenic compression of stomach and gallbladder are out of the picture. (see Nutritional Management assessment also)    @PROBHOSP @  LOS: 2  days    Sadira Standard M. Derrell Lolling, M.D., [facility] General and Minimally invasive Surgery Breast and Colorectal Surgery Office:   [phone] Pager:   [phone]  02/25/2013  . .prob

## 2013-02-26 NOTE — Progress Notes (Signed)
3 Days Post-Op  Subjective: No complaints.  Pain controlled with pain meds. Tolerating full liquids, no nausea  Objective: Vital signs in last 24 hours: Temp:  [97.4 F (36.3 C)-99.4 F (37.4 C)] 97.4 F (36.3 C) (08/30 0622) Pulse Rate:  [65-76] 65 (08/30 0622) Resp:  [14-18] 16 (08/30 0622) BP: (96-112)/(60-72) 108/69 mmHg (08/30 0622) SpO2:  [95 %-100 %] 95 % (08/30 0622) Last BM Date: 02/22/13  Intake/Output from previous day: 08/29 0701 - 08/30 0700 In: 2534.6 [P.O.:720; I.V.:1814.6] Out: 1415 [Urine:750; Drains:665] Intake/Output this shift: Total I/O In: -  Out: 450 [Urine:450]  General appearance: alert, cooperative and no distress Resp: nonlabored Cardio: normal rate, regular GI: soft, appropriate incisional tenderness, ND, wound without infection, JP thin ss  Lab Results:   Recent Labs  02/24/13 0327 02/25/13 0500  WBC 7.5 8.9  HGB 10.6* 10.8*  HCT 33.3* 33.8*  PLT 154 164   BMET  Recent Labs  02/24/13 0327 02/25/13 0500  NA 135 135  K 3.8 3.9  CL 105 108  CO2 25 24  GLUCOSE 133* 119*  BUN 9 8  CREATININE 0.43* 0.43*  CALCIUM 8.3* 8.4   PT/INR  Recent Labs  02/24/13 0327 02/25/13 0500  LABPROT 18.5* 18.7*  INR 1.59* 1.61*   ABG No results found for this basename: PHART, PCO2, PO2, HCO3,  in the last 72 hours  Studies/Results: No results found.  Anti-infectives: Anti-infectives   Start     Dose/Rate Route Frequency Ordered Stop   02/23/13 1600  ceFAZolin (ANCEF) IVPB 2 g/50 mL premix     2 g 100 mL/hr over 30 Minutes Intravenous 3 times per day 02/23/13 1304 02/24/13 0726   02/23/13 0612  ceFAZolin (ANCEF) IVPB 2 g/50 mL premix     2 g 100 mL/hr over 30 Minutes Intravenous On call to O.R. 02/23/13 0981 02/23/13 1914      Assessment/Plan: s/p Procedure(s): OPEN SPLENECTOMY AND CHOLECYSTECTOMY  (N/A) she looks good sitting up in a chair.  pain seems to be pretty well controlled.  will advance diet and heplock and she may be  ready for discharge to home tomorrow.  LOS: 3 days    Lodema Pilot DAVID 02/26/2013

## 2013-02-27 LAB — TYPE AND SCREEN
ABO/RH(D): A POS
Antibody Screen: NEGATIVE
Unit division: 0

## 2013-02-27 LAB — FACTOR 5 ASSAY: Factor V Activity: 76 % (ref 65–150)

## 2013-02-27 NOTE — Progress Notes (Signed)
4 Days Post-Op  Subjective: Tolerating diet. Pain improving  Objective: Vital signs in last 24 hours: Temp:  [97.8 F (36.6 C)-98.7 F (37.1 C)] 98.1 F (36.7 C) (08/31 0228) Pulse Rate:  [66-78] 78 (08/31 0228) Resp:  [16-18] 18 (08/31 0228) BP: (104-109)/(54-72) 107/54 mmHg (08/31 0228) SpO2:  [99 %-100 %] 100 % (08/31 0228) Last BM Date: 02/22/13  Intake/Output from previous day: 08/30 0701 - 08/31 0700 In: 480 [P.O.:480] Out: 1885 [Urine:1450; Drains:435] Intake/Output this shift:    General appearance: alert, cooperative and no distress Resp: nonlabored Cardio: normal rate, regular GI: soft, mild incisional tenderness, ND, wound looks good, no infection, JP ss  Lab Results:   Recent Labs  02/25/13 0500  WBC 8.9  HGB 10.8*  HCT 33.8*  PLT 164   BMET  Recent Labs  02/25/13 0500  NA 135  K 3.9  CL 108  CO2 24  GLUCOSE 119*  BUN 8  CREATININE 0.43*  CALCIUM 8.4   PT/INR  Recent Labs  02/25/13 0500  LABPROT 18.7*  INR 1.61*   ABG No results found for this basename: PHART, PCO2, PO2, HCO3,  in the last 72 hours  Studies/Results: No results found.  Anti-infectives: Anti-infectives   Start     Dose/Rate Route Frequency Ordered Stop   02/23/13 1600  ceFAZolin (ANCEF) IVPB 2 g/50 mL premix     2 g 100 mL/hr over 30 Minutes Intravenous 3 times per day 02/23/13 1304 02/24/13 0726   02/23/13 0612  ceFAZolin (ANCEF) IVPB 2 g/50 mL premix     2 g 100 mL/hr over 30 Minutes Intravenous On call to O.R. 02/23/13 1610 02/23/13 9604      Assessment/Plan: s/p Procedure(s): OPEN SPLENECTOMY AND CHOLECYSTECTOMY  (N/A) I think that she would be okay for discharge to home today but she feels like she needs another day to regain some strength.  will plan for discharge to home tomorrow  LOS: 4 days    Lodema Pilot DAVID 02/27/2013

## 2013-02-28 MED ORDER — OXYCODONE-ACETAMINOPHEN 5-325 MG PO TABS: 1.0000 | ORAL_TABLET | ORAL | Status: DC | PRN

## 2013-02-28 NOTE — Progress Notes (Signed)
5 Days Post-Op  Subjective: Doing well.  Tolerating diet. Pain controlled.  Objective: Vital signs in last 24 hours: Temp:  [97.7 F (36.5 C)-98.4 F (36.9 C)] 97.7 F (36.5 C) (09/01 0519) Pulse Rate:  [65-71] 70 (09/01 0519) Resp:  [16-18] 18 (09/01 0519) BP: (92-107)/(37-66) 106/66 mmHg (09/01 0519) SpO2:  [98 %-100 %] 98 % (09/01 0519) Last BM Date: 02/22/13  Intake/Output from previous day: 08/31 0701 - 09/01 0700 In: 840 [P.O.:840] Out: 3030 [Urine:2650; Drains:380] Intake/Output this shift:    General appearance: alert, cooperative and no distress Resp: clear to auscultation bilaterally Cardio: regular rate and rhythm, S1, S2 normal, no murmur, click, rub or gallop GI: soft, mild incisional tenderness, ND, wound looks good. JP thin ss but clear  Lab Results:  No results found for this basename: WBC, HGB, HCT, PLT,  in the last 72 hours BMET No results found for this basename: NA, K, CL, CO2, GLUCOSE, BUN, CREATININE, CALCIUM,  in the last 72 hours PT/INR No results found for this basename: LABPROT, INR,  in the last 72 hours ABG No results found for this basename: PHART, PCO2, PO2, HCO3,  in the last 72 hours  Studies/Results: No results found.  Anti-infectives: Anti-infectives   Start     Dose/Rate Route Frequency Ordered Stop   02/23/13 1600  ceFAZolin (ANCEF) IVPB 2 g/50 mL premix     2 g 100 mL/hr over 30 Minutes Intravenous 3 times per day 02/23/13 1304 02/24/13 0726   02/23/13 0612  ceFAZolin (ANCEF) IVPB 2 g/50 mL premix     2 g 100 mL/hr over 30 Minutes Intravenous On call to O.R. 02/23/13 9604 02/23/13 5409      Assessment/Plan: s/p Procedure(s): OPEN SPLENECTOMY AND CHOLECYSTECTOMY  (N/A) she looks and feels well.  she should be okay for discharge to home.  JP fluid looks mostly serous and clear.  no evidence of pancreatic leak.  LOS: 5 days    Lodema Pilot DAVID 02/28/2013

## 2013-02-28 NOTE — Progress Notes (Signed)
Patient voiced readiness to go home. Staples intact and ota to lt abd. Patient relay MD to remove staples in office. JP d/c'd with ease. Pain med accepted for mild pain prior d/c. D/C instructions discussed and accepted.

## 2013-03-01 LAB — MITOCHONDRIAL ANTIBODIES: Mitochondrial M2 Ab, IgG: 1.31 — ABNORMAL HIGH (ref <0.91)

## 2013-03-03 ENCOUNTER — Telehealth: Payer: Self-pay | Admitting: General Practice

## 2013-03-03 ENCOUNTER — Telehealth (INDEPENDENT_AMBULATORY_CARE_PROVIDER_SITE_OTHER): Payer: Self-pay

## 2013-03-03 DIAGNOSIS — Z9081 Acquired absence of spleen: Secondary | ICD-10-CM

## 2013-03-03 LAB — HEMOCHROMATOSIS DNA-PCR(C282Y,H63D): DNA Mutation Analysis: NOT DETECTED

## 2013-03-03 MED ORDER — ONETOUCH ULTRASOFT LANCETS MISC: Status: DC

## 2013-03-03 MED ORDER — HYDROCODONE-ACETAMINOPHEN 5-325 MG PO TABS: 1.0000 | ORAL_TABLET | ORAL | Status: DC | PRN

## 2013-03-03 MED ORDER — GLUCOSE BLOOD VI STRP: ORAL_STRIP | Status: DC

## 2013-03-03 MED ORDER — ONETOUCH ULTRA SYSTEM W/DEVICE KIT: 1.0000 | PACK | Freq: Once | Status: DC

## 2013-03-03 NOTE — Telephone Encounter (Signed)
Message copied by Ivory Broad on Thu Mar 03, 2013  5:19 PM ------      Message from: Rise Paganini      Created: Thu Mar 03, 2013  2:07 PM      Regarding: Marnette Burgess: [phone]       Patient has questions pertaining to the surgery. Please call to discuss. ------

## 2013-03-03 NOTE — Telephone Encounter (Signed)
I called the pt back.  She asked for a refill on her pain medicine and for her pathology results.  I told her the pathology showed benign spleen tissue, and her gallbladder had chronic inflammation, gallstones and cholesterol.  She is relieved.  I offered her the refill protocol with Hydrocodone.  She is ok with that.  Her follow up is 9/11.  Per Protocol I called in Hydrocodone 5/325 po 1-2 q 4-6 hrs prn pain #30 no refill, generic allowed to [facility] on [address].

## 2013-03-03 NOTE — Telephone Encounter (Signed)
Spoke with pt stated that per insurance any glucometer is ok. I faxed in a onetouch.

## 2013-03-03 NOTE — Telephone Encounter (Signed)
Pt left a message on triage line wanting a glucometer. Spoke with pt and advised to contact insurance to find out What brand Glucometer? Test Strips? And Lancets? That they prefer. Waiting on pt to return call.

## 2013-03-07 ENCOUNTER — Telehealth: Payer: Self-pay | Admitting: Family Medicine

## 2013-03-07 ENCOUNTER — Encounter: Payer: 59 | Admitting: Internal Medicine

## 2013-03-07 ENCOUNTER — Other Ambulatory Visit: Payer: Self-pay | Admitting: General Practice

## 2013-03-07 MED ORDER — GLUCOSE BLOOD VI STRP: ORAL_STRIP | Status: DC

## 2013-03-07 MED ORDER — ONETOUCH ULTRA SYSTEM W/DEVICE KIT: 1.0000 | PACK | Freq: Once | Status: DC

## 2013-03-07 NOTE — Telephone Encounter (Signed)
These medications were sent to pharmacy twice since last week. [facility] confirmed that they do have.

## 2013-03-07 NOTE — Telephone Encounter (Signed)
Patient Information:  Caller Name: Kenyetta  Phone: [phone]  [name], Sabrina [name]  Gender: Female  DOB: 1956/05/17  Age: 57 Years  PCP: Sheliah Hatch.  Office Follow Up:  Does the office need to follow up with this patient?: No  Instructions For The Office: N/A   Symptoms  Reason For Call & Symptoms: She needs a RX called in for a new glucometer.  She has never had a glucometer.  She called last week got a RX for one and when she went to the pharmacy there were only lancets called in.   Per EPIC 03/03/13 a One Touch Ultra glucometer kit, strips and lancets were all call into [facility] on [address] # [phone].  Pt is having no tmedical SX right now.  Called in a One Touch Ultra Kit, the  one Touch ultra test strips x 100 with 12 reills and they still have the order for the lancets.  Spoke with [employer].  Reviewed Health History In EMR: N/A  Reviewed Medications In EMR: N/A  Reviewed Allergies In EMR: N/A  Reviewed Surgeries / Procedures: N/A  Date of Onset of Symptoms: Unknown  Guideline(s) Used:  No Protocol Available - Information Only  Disposition Per Guideline:   Home Care  Reason For Disposition Reached:   Information only question and nurse able to answer  Advice Given:  N/A  Patient Will Follow Care Advice:  YES

## 2013-03-10 ENCOUNTER — Encounter (INDEPENDENT_AMBULATORY_CARE_PROVIDER_SITE_OTHER): Payer: Self-pay | Admitting: General Surgery

## 2013-03-10 ENCOUNTER — Ambulatory Visit (INDEPENDENT_AMBULATORY_CARE_PROVIDER_SITE_OTHER): Payer: 59 | Admitting: General Surgery

## 2013-03-10 VITALS — BP 104/64 | HR 72 | Temp 97.7°F | Resp 14 | Ht 64.0 in | Wt 146.8 lb

## 2013-03-10 DIAGNOSIS — D7381 Neutropenic splenomegaly: Secondary | ICD-10-CM

## 2013-03-10 NOTE — Patient Instructions (Signed)
You are recovering from the splenectomy and cholecystectomy without any obvious surgical complications.  Your wound is healing well. The staples were removed today. You may remove the Steri-Strips one week from now. You may shower, but do not take any tub bath.  You are restricted to 20 pounds lifting for another 4 weeks.  You may drive your car when you are comfortable and can the seatbelt properly.  Return to see Dr. Derrell Lolling in 6 weeks

## 2013-03-10 NOTE — Progress Notes (Signed)
Patient ID: Sabrina [name], female   DOB: 1956-02-02, 57 y.o.   MRN: [id number] History: This patient underwent open splenectomy and cholecystectomy on 02/23/2013 because of neutropenic splenomegaly and gallstones. She was found to have portal hypertension, ascites, and a firm, micronodular liver assistant with primary hepatocellular disease. 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 She says she has gained a little bit of weight. She is eating okay but still gets up early satiety. Recent 24 -hour episode of diarrhea and nausea early this week that has now resolved she feels fine. No fevers. Her counts have come up. She has an appointment to see Dr. Myna [name] on September 30. Dr. Myna [name] ordered numerous blood tests and serologic tests to begin evaluation for possible liver disease.  Exam: Patient looks well. No distress. Lungs clear to auscultation bilaterally Abdomen soft. Left subcostal incision healing nicely. No drainage or infection. Staples removed. Steri-Strips are applied.  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 No heavy lifting for 4 more weeks See Dr. Myna [name] on September 30 Return to work October 15 Return to see me in 6 weeks.    Angelia Mould. Derrell Lolling, M.D., [facility] General and Minimally invasive Surgery Breast and Colorectal  Surgery Office:   [phone] Pager:   [phone]

## 2013-03-10 NOTE — Discharge Summary (Signed)
Patient ID: Sabrina [name] [id number] 57 y.o. 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 Hepatocellular disease and portal hypertension, workup in progress history of gastric antral ulcer, healing Diet-controlled diabetes Hypertension History Crohn's disease, on mesalamine History celiac disease History of bipolar disorder, on Abilify, Prozac, and desyrel Severe protein calorie manner malnutrition   Discharge Diagnoses:  Neutropenic splenomegaly Chronic cholecystitis with cholelithiasis. Hepatocellular disease and portal hypertension, workup in progress history of gastric antral ulcer, healing Diet-controlled diabetes Hypertension History Crohn's disease, on mesalamine History celiac disease History of bipolar disorder, on Abilify, Prozac, and desyrel Severe protein calorie manner malnutrition   Operations: Procedure(s): OPEN SPLENECTOMY AND CHOLECYSTECTOMY   Admission Condition: good  Discharged Condition: good  Indication for Admission: Sabrina [name] is a 57 y.o. female. She was referred to me by Dr. Arlan Organ for consideration of splenectomy for neutropenic splenomegaly. She also has gallstones. Dr. [name] is her gastroenterologist. Dr. Neena Rhymes he is her primary care physician. Dr. Evelene Croon is her psychiatrist.  She was recently found to have neutropenia and thrombocytopenia.  She was referred to Dr. Myna Hidalgo. Blood work showed hemoglobin 9.9, white blood cell count 2,000, platelet count 96,000, prothrombin time 17.7, INR 1.8. A CT scan was performed which shows no abnormalities in the chest except for slight cardiac enlargement. In the abdomen she was found to have significant splenomegaly with progression from 8.2 cm in 2006 to--20 cm in  craniocaudal dimension. The stomach looked normal. There were some upper abdominal varices with recanalization of the umbilical vein. Numerous gallstones were noted. There is no mass or adenopathy noted.  She was recently also diagnosed with an antral ulcer. That was treated by [name].. Followup endoscopy on July 14 showed substantial healing of the ulcer but still with residual ulceration. Biopsies showed benign changes, and negative for H. Pylori. The GE junction and fundus looked normal. No varices were seen.  Dr. Myna Hidalgo performed a bone marrow which showed hypercellular bone marrow for age. Flow cytometry was negative. Correlation with cytogenetic studies was recommended. At this point Dr. Myna Hidalgo referred her for splenectomy and discussed this with me. She has received all of her vaccines in his office.  Symptomatically she states that she was transfused with 3 units of packed cells in May for a hemoglobin of 4.6. She has some epigastric pain, left upper quadrant and right upper quadrant pain. She has early satiety and post prandial boating. She says she's had a 70 pound weight loss over the past 4 months.  She denies any history of liver disease. Specifically denies any history of alcohol abuse, hepatitis or cirrhosis.  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  She is brought to the operating room electively   Hospital Course: On the day of admission the patient was taken to the operating room. Through a left subcostal incision she underwent splenectomy and cholecystectomy.The spleen was markedly enlarged, almost 25 cm in craniocaudal dimension. The surface of the spleen was smooth and the color was normal and was homogenous. There were no nodules. There is no hilar adenopathy. Portal hypertension is suspected, especially in the retrogastric and gastrocolic position. The umbilical vein was also patent and was quite large. There were numerous palpable  gallstones in the gallbladder but the gallbladder was not inflamed. The anatomy of the cystic duct and cystic artery and  common bile duct appeared conventional. The liver was diseased, was hard and micronodular in appearance. There were some clear ascites, probably about 500 cc - 1000 cc which was evacuated. The ascites was not excessive. The stomach, large bowel, and small bowel were otherwise grossly normal to inspection.  Final pathology report showed lymphoid hyperplasia of the spleen, benign, perihilar nodes showed benign lymphoid hyperplasia. There was no monoclonal population's. Gallbladder showed chronic cholecystitis with cholelithiasis.  The operative findings were discussed with Dr. Algis Greenhouse, who is going to initiate a workup for hepatic disease.  Postoperatively the patient did relatively well without any major complications. It took a few days for her to resume normal activities ambulation and to resume diet.On the day of discharge she was tolerating a diet, feeling well and wanted to go home. The JP fluid was serous and clear and the drain was removed. She was given a prescription for analgesics. Diet and activities were discussed. She was instructed to follow up with Dr. Derrell Lolling in approximately 2 weeks.  The last blood work showed hemoglobin 10.8, platelet count 164,000, the BBC 8900. BUN 8. Creatinine 0.43. Glucose 119.INR 1.61.  Consults: None  Significant Diagnostic Studies: Surgical pathology  Treatments: surgery: Splenectomy and cholecystectomy.  Disposition: Home  Patient Instructions:    Medication List         ARIPiprazole 10 MG tablet  Commonly known as:  ABILIFY  Take 10 mg by mouth at bedtime.     fexofenadine 180 MG tablet  Commonly known as:  ALLEGRA  Take 180 mg by mouth every morning.     FLUoxetine 40 MG capsule  Commonly known as:  PROZAC  Take 40 mg by mouth every morning.     iron polysaccharides 150 MG capsule  Commonly known as:  POLY-IRON  150  Take 1 capsule (150 mg total) by mouth 2 (two) times daily.     LIALDA 1.2 G EC tablet  Generic drug:  mesalamine  Take one tablet by mouth twice daily     LORazepam 0.5 MG tablet  Commonly known as:  ATIVAN  Take 0.5 mg by mouth 2 (two) times daily.     metFORMIN 500 MG tablet  Commonly known as:  GLUCOPHAGE  Take 1 tablet (500 mg total) by mouth 2 (two) times daily with a meal.     multivitamin with minerals Tabs tablet  Take 1 tablet by mouth every morning.     omeprazole 40 MG capsule  Commonly known as:  PRILOSEC  Take 1 capsule (40 mg total) by mouth 2 (two) times daily.     oxyCODONE-acetaminophen 5-325 MG per tablet  Commonly known as:  PERCOCET/ROXICET  Take 1 tablet by mouth every 4 (four) hours as needed for pain.     pentosan polysulfate 100 MG capsule  Commonly known as:  ELMIRON  Take 200 mg by mouth 2 (two) times daily.     traZODone 50 MG tablet  Commonly known as:  DESYREL  Take 25 mg by mouth at bedtime.     Vitamin D (Ergocalciferol) 50000 UNITS Caps capsule  Commonly known as:  DRISDOL  Take 50,000 Units by mouth every 7 (seven) days. Friday        Activity: no sports or heavy lifting  for 6 weeks or so. No driving for 2 weeks. She may shower.  Ambulate a lot. Diet: low fat, low cholesterol diet Wound Care: none needed  Follow-up:  With Dr. Derrell Lolling in 2 weeks.  Signed: Mikey Bussing  Grayce Sessions, M.D., FACS General and minimally invasive surgery Breast and Colorectal Surgery  03/10/2013, 6:18 AM     ,

## 2013-03-11 ENCOUNTER — Other Ambulatory Visit (INDEPENDENT_AMBULATORY_CARE_PROVIDER_SITE_OTHER): Payer: Self-pay

## 2013-03-11 DIAGNOSIS — Z9081 Acquired absence of spleen: Secondary | ICD-10-CM

## 2013-03-11 MED ORDER — HYDROCODONE-ACETAMINOPHEN 5-325 MG PO TABS: 1.0000 | ORAL_TABLET | ORAL | Status: DC | PRN

## 2013-03-14 ENCOUNTER — Telehealth (INDEPENDENT_AMBULATORY_CARE_PROVIDER_SITE_OTHER): Payer: Self-pay

## 2013-03-14 NOTE — Telephone Encounter (Signed)
[facility] states [facility] not get the [facility] she requested; [facility] stated [facility] did [facility] it , the rx had not been entered in the system when her husband was there for the pick up on Friday . Patient is aware

## 2013-03-17 ENCOUNTER — Encounter (INDEPENDENT_AMBULATORY_CARE_PROVIDER_SITE_OTHER): Payer: Self-pay

## 2013-03-17 ENCOUNTER — Telehealth (INDEPENDENT_AMBULATORY_CARE_PROVIDER_SITE_OTHER): Payer: Self-pay

## 2013-03-17 NOTE — Telephone Encounter (Signed)
Correction to the fax #, [phone]

## 2013-03-17 NOTE — Telephone Encounter (Signed)
Message copied by Ivory Broad on Thu Mar 17, 2013 12:03 PM ------      Message from: Louie Casa      Created: Thu Mar 17, 2013 11:16 AM      Regarding: Dr. Daneen Schick work note      Contact: [phone]       Patient requested a return work note if you have any questions you can call her.            Thank you. ------

## 2013-03-17 NOTE — Telephone Encounter (Signed)
I called the pt back.  Her work needs a note stating she is coming back on 04/13/2013.  She wants me to fax it to [phone] attention [facility].

## 2013-03-24 ENCOUNTER — Telehealth (INDEPENDENT_AMBULATORY_CARE_PROVIDER_SITE_OTHER): Payer: Self-pay

## 2013-03-24 ENCOUNTER — Telehealth (INDEPENDENT_AMBULATORY_CARE_PROVIDER_SITE_OTHER): Payer: Self-pay | Admitting: *Deleted

## 2013-03-24 ENCOUNTER — Other Ambulatory Visit (INDEPENDENT_AMBULATORY_CARE_PROVIDER_SITE_OTHER): Payer: Self-pay | Admitting: *Deleted

## 2013-03-24 ENCOUNTER — Ambulatory Visit
Admission: RE | Admit: 2013-03-24 | Discharge: 2013-03-24 | Disposition: A | Discharge status: Home / Self Care | Payer: 59 | Source: Ambulatory Visit | Attending: General Surgery | Admitting: General Surgery

## 2013-03-24 DIAGNOSIS — Z9049 Acquired absence of other specified parts of digestive tract: Secondary | ICD-10-CM

## 2013-03-24 DIAGNOSIS — Z9081 Acquired absence of spleen: Secondary | ICD-10-CM

## 2013-03-24 LAB — CBC WITH DIFFERENTIAL/PLATELET
Basophils Absolute: 0.1 10*3/uL (ref 0.0–0.1)
Basophils Relative: 1 % (ref 0–1)
Eosinophils Absolute: 0.3 10*3/uL (ref 0.0–0.7)
Eosinophils Relative: 5 % (ref 0–5)
HCT: 41.4 % (ref 36.0–46.0)
MCH: 30 pg (ref 26.0–34.0)
MCHC: 32.9 g/dL (ref 30.0–36.0)
MCV: 91.2 fL (ref 78.0–100.0)
Monocytes Absolute: 0.4 10*3/uL (ref 0.1–1.0)
Monocytes Relative: 8 % (ref 3–12)
RDW: 17.3 % — ABNORMAL HIGH (ref 11.5–15.5)

## 2013-03-24 MED ORDER — ONDANSETRON 4 MG PO TBDP: 4.0000 mg | ORAL_TABLET | Freq: Four times a day (QID) | ORAL | Status: DC | PRN

## 2013-03-24 MED ORDER — IOHEXOL 300 MG/ML  SOLN
100.0000 mL | Freq: Once | INTRAMUSCULAR | Status: AC | PRN
  Administered 2013-03-24: 100 mL via INTRAVENOUS

## 2013-03-24 NOTE — Telephone Encounter (Signed)
I called pt regarding scheduled CT scan located at [address] with GI on 03/24/13 with an arrival time of 4:00.  Informed her to pick up contrast and to have lab work done at [facility] beforehand.  Instruction for CT contrast given and NPO status.

## 2013-03-24 NOTE — Telephone Encounter (Signed)
I received a call from Dr Chilton Si.  She gave me a verbal report of the CT scan.  I then went to Dr Luisa Hart who reviewed this with me.  He states the ascites is not new because she had it preop.  He states nothing urgent to do right now.  She can go to the ER if pains worsens.  Have Dr Derrell Lolling review the scan when he returns.  I notified the pt the results about the ascites and swelling.  I told her there is no acute postop complication.  She had her labs drawn today.  She sees Dr Myna Hidalgo Tuesday.  I offered for her to come to our office tomorrow to be checked.  I also said if the pain worsens and becomes unbearable she should go to the ER and they may need to pull off some fluid.  She does not like the sound of that.  She accepts an appointment for 9/26 at 2:30 pm with Dr Michaell Cowing.  I told her to go to the ER over night if needed.  She agrees.  I told her I will fax a copy of the ct to Dr Myna Hidalgo too.  Report faxed.

## 2013-03-24 NOTE — Telephone Encounter (Signed)
Patient called this morning concerned about continued abdominal swelling and pain following Open Splenectomy and Cholecystectomy on 02/23/13.  Patient reports generalized swelling across her abdomen, pain in the left side that radiates to her back at time, nausea and vomiting that comes and goes, and diarrhea if she doesn't take Imodium.  Patient denies any fevers, drainage from incision sites, urinary issues, or other signs of infection at incision site.  Spoke to Dr. Derrell Lolling in office who wants to order CBC with diff, CMP, Amylase, Lipase, and CT Abd/Pelvis with contrast.  He also ok'd Zofran 4mg  ODT to be went to patient's pharmacy.  All orders have been entered at this time, awaiting appt and pre-cert of CT scan then patient will be updated with that information.  Patient last ate at 930a this morning and was instructed not to eat or drink anything else in case they are able to get her scheduled this afternoon.  Prescription escribed to [facility] per patient's request.  Dr. Derrell Lolling wants patient to be seen in urgent office today or tomorrow depending on when patient goes for CT scan and labs.  Patient states understanding and agreeable with the plan at this time.

## 2013-03-25 ENCOUNTER — Telehealth (INDEPENDENT_AMBULATORY_CARE_PROVIDER_SITE_OTHER): Payer: Self-pay

## 2013-03-25 ENCOUNTER — Telehealth: Payer: Self-pay | Admitting: Internal Medicine

## 2013-03-25 ENCOUNTER — Ambulatory Visit (INDEPENDENT_AMBULATORY_CARE_PROVIDER_SITE_OTHER): Payer: 59 | Admitting: Surgery

## 2013-03-25 ENCOUNTER — Encounter (INDEPENDENT_AMBULATORY_CARE_PROVIDER_SITE_OTHER): Payer: Self-pay | Admitting: Surgery

## 2013-03-25 VITALS — BP 100/64 | HR 80 | Temp 98.6°F | Resp 18 | Ht 64.0 in | Wt 140.0 lb

## 2013-03-25 DIAGNOSIS — Z9089 Acquired absence of other organs: Secondary | ICD-10-CM

## 2013-03-25 DIAGNOSIS — E43 Unspecified severe protein-calorie malnutrition: Secondary | ICD-10-CM

## 2013-03-25 DIAGNOSIS — K7460 Unspecified cirrhosis of liver: Secondary | ICD-10-CM | POA: Insufficient documentation

## 2013-03-25 DIAGNOSIS — R197 Diarrhea, unspecified: Secondary | ICD-10-CM

## 2013-03-25 DIAGNOSIS — R188 Other ascites: Secondary | ICD-10-CM

## 2013-03-25 DIAGNOSIS — Z9081 Acquired absence of spleen: Secondary | ICD-10-CM | POA: Insufficient documentation

## 2013-03-25 LAB — COMPREHENSIVE METABOLIC PANEL
AST: 60 U/L — ABNORMAL HIGH (ref 0–37)
Alkaline Phosphatase: 144 U/L — ABNORMAL HIGH (ref 39–117)
BUN: 5 mg/dL — ABNORMAL LOW (ref 6–23)
Creat: 0.54 mg/dL (ref 0.50–1.10)
Glucose, Bld: 131 mg/dL — ABNORMAL HIGH (ref 70–99)

## 2013-03-25 LAB — LIPASE: Lipase: 24 U/L (ref 0–75)

## 2013-03-25 MED ORDER — SPIRONOLACTONE 50 MG PO TABS: 100.0000 mg | ORAL_TABLET | Freq: Every day | ORAL | Status: DC

## 2013-03-25 MED ORDER — HYDROCODONE-ACETAMINOPHEN 5-325 MG PO TABS: 1.0000 | ORAL_TABLET | Freq: Four times a day (QID) | ORAL | Status: DC | PRN

## 2013-03-25 MED ORDER — FUROSEMIDE 20 MG PO TABS: 20.0000 mg | ORAL_TABLET | Freq: Every day | ORAL | Status: DC

## 2013-03-25 NOTE — Progress Notes (Signed)
Subjective:     Patient ID: Sabrina [name], female   DOB: 10-02-1955, 57 y.o.   MRN: [id number]  HPI  Sabrina [name]  June 09, 1956 [id number]  Patient Care Team: Sheliah Hatch, MD as PCP - General Josph Macho, MD as Consulting Physician (Oncology) Hilarie Fredrickson, MD as Consulting Physician (Gastroenterology) [name], MD as Consulting Physician (General Surgery)  Procedure (Date: 02/23/2013):  Open splenectomy and cholecystectomy by Dr. Claud Kelp  This patient returns for surgical re-evaluation.  She has numerous complaints.  She claims she is having loose stools/diarrhea.  Using Imodium up to six pills a day.  10-12 bowel movements.  Patient feels more distended "like I am pregnant".  Some abdominal soreness as well.  No nausea or vomiting.  No rectal bleeding.  No hematemesis.  Energy level slowly getting better but not worse.  Not in severe pain.  No fevers chills or sweats.  Some bloating.  Some early satiety.  Patient Active Problem List   Diagnosis Date Noted  . Ascites 03/25/2013  . Cirrhosis of liver 03/25/2013  . 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 Protein-calorie malnutrition, severe 02/25/2013  . 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 Gastric ulcer with hemorrhage 10/30/2012  . Anemia, iron deficiency 10/30/2012  . Edema 10/29/2012  . Chest pain 10/29/2012  . Anemia 10/29/2012  . Occult blood in stools 10/29/2012  . Hypokalemia 10/29/2012  . Abnormal computed tomography of thoracic spine 09/22/2012  . Unspecified vitamin D deficiency 08/18/2012  . Recurrent aphthous ulcer 12/22/2011  . UTI (lower urinary tract infection) 09/25/2011  . Eustachian tube dysfunction 07/14/2011  . Aortic stenosis 03/20/2011  . Foot pain 12/26/2010  . Breast cancer screening 11/13/2010  . BIPOLAR AFFECTIVE DISORDER 07/09/2010  . MACULAR DEGENERATION 07/09/2010  . INTERSTITIAL CYSTITIS 07/09/2010  . CROHN'S DISEASE-LARGE INTESTINE 01/10/2009  . DIABETES MELLITUS  08/04/2007  . HYPERLIPIDEMIA 08/04/2007  . ANXIETY DEPRESSION 08/04/2007  . HYPERTENSION 08/04/2007  . ALLERGIC RHINITIS 08/04/2007  . COLITIS 08/04/2007  . CELIAC DISEASE 08/04/2007  . ARTHRITIS 08/04/2007  . DEGENERATIVE DISC DISEASE 08/04/2007  . DIARRHEA 08/04/2007    Past Medical History  Diagnosis Date  . Arthritis   . Allergic rhinitis   . Colitis   . Degenerative disc disease   . Hyperlipidemia   . Hypertension   . Celiac disease   . Bipolar disorder   . Macular degeneration   . Interstitial cystitis   . Diabetes mellitus without complication   . Anemia, iron deficiency 10/30/2012  . Heart murmur   . Anxiety   . Depression     Past Surgical History  Procedure Laterality Date  . Tubal ligation    . Appendectomy    . Lumbar laminectomy    . Carpal tunnel release      bilateral  . Cesarean section    . Cysto/ hod/ instillatio clorpactin  07-03-2008  &  06-09-2001    INTERSTITIAL CYSTITIS  . Shoulder arthroscopy w/ rotator cuff repair  01-25-2008    LEFT SHOULDER /   INCLUDING CAPSULECTOMY WITH DEBRIDEMENT/  SAD WITH RESECTION OF AC  . Right shoulder arthroscopy/ open resection distal clavicle/ debridement adhesive capsulitis/ rotator cuff repair  08-30-2003  . Tonsillectomy and adenoidectomy    . Transthoracic echocardiogram  03-28-2011    NORMAL LVSF/ EF 60-65%/ VERY MILD AORTIC STENOSIS, TRIVIAL REGURG./ MILDLY DILATED LEFT ATRIUM  . Cystoscopy  11/26/2011    Procedure: CYSTOSCOPY;  Surgeon: Valetta Fuller, MD;  Location: [facility];  Service: Urology;  Laterality: N/A;  clorpactin  in bladder  . Splenectomy, total N/A 02/23/2013    Procedure: OPEN SPLENECTOMY AND CHOLECYSTECTOMY ;  Surgeon: [name], MD;  Location: [facility];  Service: General;  Laterality: N/A;    History   Social History  . Marital Status: Married    Spouse Name: N/A    Number of Children: N/A  . Years of Education: N/A   Occupational History  . Not on file.    Social History Main Topics  . Smoking status: Never Smoker   . Smokeless tobacco: Never Used  . Alcohol Use: No  . Drug Use: No  . Sexual Activity: Not on file   Other Topics Concern  . Not on file   Social History Narrative   Daughter lives in [place], [place]    Family History  Problem Relation Age of Onset  . Diabetes Mother   . Hypertension Mother   . Diabetes Brother   . Hypertension Brother   . Hyperlipidemia Brother   . Hypertension    . Colon cancer Neg Hx     Current Outpatient Prescriptions  Medication Sig Dispense Refill  . ARIPiprazole (ABILIFY) 10 MG tablet Take 10 mg by mouth at bedtime.       . Blood Glucose Monitoring Suppl (ONE TOUCH ULTRA SYSTEM KIT) W/DEVICE KIT 1 kit by Does not apply route once.  1 each  0  . fexofenadine (ALLEGRA) 180 MG tablet Take 180 mg by mouth every morning.       Marland Kitchen FLUoxetine (PROZAC) 40 MG capsule Take 40 mg by mouth every morning.       Marland Kitchen glucose blood (ONE TOUCH ULTRA TEST) test strip Use as instructed to test glucose levels once daily.  100 each  12  . HYDROcodone-acetaminophen (NORCO/VICODIN) 5-325 MG per tablet Take 1 tablet by mouth every 6 (six) hours as needed for pain.  40 tablet  0  . iron polysaccharides (POLY-IRON 150) 150 MG capsule Take 1 capsule (150 mg total) by mouth 2 (two) times daily.  60 capsule  0  . Lancets (ONETOUCH ULTRASOFT) lancets Use as instructed to test glucose levels once daily.  100 each  12  . LIALDA 1.2 G EC tablet Take one tablet by mouth twice daily  180 tablet  0  . LORazepam (ATIVAN) 0.5 MG tablet Take 0.5 mg by mouth 2 (two) times daily.       . metFORMIN (GLUCOPHAGE) 500 MG tablet Take 1 tablet (500 mg total) by mouth 2 (two) times daily with a meal.  180 tablet  3  . Multiple Vitamin (MULTIVITAMIN WITH MINERALS) TABS Take 1 tablet by mouth every morning.      Marland Kitchen omeprazole (PRILOSEC) 40 MG capsule Take 1 capsule (40 mg total) by mouth 2 (two) times daily.  60 capsule  6  . ondansetron  (ZOFRAN ODT) 4 MG disintegrating tablet Take 1 tablet (4 mg total) by mouth every 6 (six) hours as needed for nausea.  15 tablet  0  . pentosan polysulfate (ELMIRON) 100 MG capsule Take 200 mg by mouth 2 (two) times daily.       . traZODone (DESYREL) 50 MG tablet Take 25 mg by mouth at bedtime.        . Vitamin D, Ergocalciferol, (DRISDOL) 50000 UNITS CAPS Take 50,000 Units by mouth every 7 (seven) days. Friday      . furosemide (LASIX) 20 MG tablet Take 1 tablet (20 mg total) by mouth  daily.  30 tablet  0  . spironolactone (ALDACTONE) 50 MG tablet Take 2 tablets (100 mg total) by mouth daily.  30 tablet  0   No current facility-administered medications for this visit.     Allergies  Allergen Reactions  . Sulfonamide Derivatives     Classic angioedema reaction  . Wheat Bran     Celiac's disease    BP 100/64  Pulse 80  Temp(Src) 98.6 F (37 C)  Resp 18  Ht 5\' 4"  (1.626 m)  Wt 140 lb (63.504 kg)  BMI 24.02 kg/m2  Ct Abdomen Pelvis W Contrast  03/24/2013   CLINICAL DATA:  Abdominal swelling, pain along abdominal incision site since splenectomy and cholecystectomy 1 month ago. Prior appendectomy.  EXAM: CT ABDOMEN AND PELVIS WITH CONTRAST  TECHNIQUE: Multidetector CT imaging of the abdomen and pelvis was performed using the standard protocol following bolus administration of intravenous contrast.  CONTRAST:  OMNIPAQUE IOHEXOL 300 MG/ML  SOLN  COMPARISON:  12/30/2012  FINDINGS: Trace pleural effusions are new since the prior study, with associated mild bibasilar compressive atelectasis.  There is new large volume abdominal ascites. There is also stranding within the subcutaneous tissues which may indicate 3rd spacing. Heart size at upper limits of normal.  Evidence of splenectomy and cholecystectomy noted. Retroaortic left renal vein noted. Liver, adrenal glands, kidneys are unremarkable with the exception of minimal apparent hepatic nodularity, for example image 16. Pancreatic duct is  at the upper limits of normal at 3 mm, for example image 27 but no focal pancreatic abnormality is identified and there is homogeneous enhancement. No free air.  Uterus and ovaries are normal. No bowel wall thickening or focal segmental dilatation. No lymphadenopathy. Lumbar spine disk degenerative change noted. No acute osseous abnormality. Mild atheromatous aortic calcification without aneurysm.  IMPRESSION: New large volume low-density ascites, pleural effusions, and subcutaneous edema. These findings together suggest 3rd spacing which may occur with liver disease, hypoproteinemia, or other systemic etiologies. No evidence for acute postoperative complication. Findings called to Maralyn Sago, Dr. Jacinto Halim nurse, by Dr. Chilton Si on 03/24/2013 at 4:30 p.m.   Electronically Signed   By: Christiana Pellant M.D.   On: 03/24/2013 16:36     Review of Systems  Constitutional: Positive for activity change, appetite change and fatigue. Negative for fever, chills and diaphoresis.  HENT: Negative for ear pain, sore throat and trouble swallowing.   Eyes: Negative for photophobia and visual disturbance.  Respiratory: Negative for cough and choking.   Cardiovascular: Negative for chest pain and palpitations.  Gastrointestinal: Positive for abdominal pain, diarrhea and abdominal distention. Negative for nausea, vomiting, constipation, blood in stool, anal bleeding and rectal pain.  Genitourinary: Negative for dysuria, frequency and difficulty urinating.  Musculoskeletal: Negative for myalgias, joint swelling and gait problem.  Skin: Negative for color change, pallor, rash and wound.  Neurological: Negative for dizziness, speech difficulty, weakness and numbness.  Hematological: Negative for adenopathy.  Psychiatric/Behavioral: Negative for confusion and agitation. The patient is not nervous/anxious.        Objective:   Physical Exam  Constitutional: She is oriented to person, place, and time. She appears well-developed  and well-nourished. She is cooperative.  Non-toxic appearance. She does not have a sickly appearance. No distress.  HENT:  Head: Normocephalic.  Mouth/Throat: Oropharynx is clear and moist. No oropharyngeal exudate.  Eyes: Conjunctivae and EOM are normal. Pupils are equal, round, and reactive to light. No scleral icterus.  Neck: Normal range of motion. No tracheal deviation present.  Cardiovascular: Normal rate and intact distal pulses.   Pulmonary/Chest: Effort normal. No respiratory distress. She exhibits no tenderness.  Abdominal: Soft. She exhibits fluid wave. She exhibits no distension. There is no splenomegaly. There is no rigidity, no rebound, no guarding, no CVA tenderness, no tenderness at McBurney's point and negative Murphy's sign. No hernia. Hernia confirmed negative in the right inguinal area and confirmed negative in the left inguinal area.    Incisions clean with normal healing ridges.  No hernias  Genitourinary: No vaginal discharge found.  Musculoskeletal: Normal range of motion. She exhibits no tenderness.  Lymphadenopathy:       Right: No inguinal adenopathy present.       Left: No inguinal adenopathy present.  Neurological: She is alert and oriented to person, place, and time. No cranial nerve deficit. She exhibits normal muscle tone. Coordination normal.  Skin: Skin is warm and dry. No rash noted. She is not diaphoretic.  Psychiatric: She has a normal mood and affect. Her speech is normal and behavior is normal. Judgment normal. Cognition and memory are normal.       Assessment:     Postoperative ascites in a patient with abnormal liver strongly suspicious for cirrhosis.  Chronic pain.  Post cholecystectomy diarrhea.     Plan:     She does not look toxic to me.  I suspect this is a concerning but not emergent issue.  I called and discussed with Dr. Drue Dun, her hematologist.  I called and discussed with Dr. Rosealee Albee gastroenterology partner, Dr. Juanda Chance.    I think the patient would benefit from paracentesis to get these ascites off.  We have set that up for early next week since she came in Friday evening and too late to schedule today.  Will recheck PT/INR and LFTs as well.  Check potassium level with starting Aldactone.    I agree with Dr. Juanda Chance that the patient needs to start diuretics to help control her ascites.  I wrote for some furosemide and Aldactone until she can be seen by gastroenterology.  Appointment has been set up to see Dr. Myna Hidalgo next week.  The fact that her anemia and trouble cytopenia have improved is a hopeful sign.  No neoplasia a hopeful sign.  I gave a copy of the pathology report to the patient.  Defer further hematological workup needs to be done.    An appointment has been set up to see gastroenterology next week.  I believe their physician extender, Willette Cluster, will be able to see the patient.  Hold off on any liver biopsy per Dr. Regino Schultze request until can be seen by GI.  Increase activity as tolerated to regular activity.  Low impact exercise such as walking an hour a day at least ideal.  Do not push through pain.She wished more narcotic pain medication.  I reviewed some hydrocodone/acetaminophen per her request.  I would not go crazy on the dosing given that there is Tylenol assessment, but that is really the only hydrocodone option we have right now.  She is confused about the name but then was reassured.  I do not feel comfortable giving much more than that.  Diet as tolerated.  Low fat high fiber diet ideal.  Bowel regimen with 30 g fiber a day and fiber supplement as needed to avoid problems.  Start with low dose fiber and gradually increase.  Can use Imodium up to eight pills a day.  Consider Pepto or something milder.  Instructions.  Defer  to GI if more elaborate dietary workup needs to occur.  Patient does have a history of celiac disease and dietary problems in the past.  Return to clinic as needed.    Instructions discussed.  Followup with primary care physician for other health issues as would normally be done.  Questions answered.  The patient expressed understanding and appreciation

## 2013-03-25 NOTE — Patient Instructions (Addendum)
I would recommend you see your gastroenterologist, Dr. [name].  We will start you on diuretics to help encourage the ascites to slow down (furosemide & aldactone).  I think you can benefit from paracentesis to drain the fluid ascites out of your abdomen.  We will work to arrange that with interventional radiology.  Continue you appointments with Dr. Myna Hidalgo, Dr. Derrell Lolling, and Dr. Marina Goodell  Ascites Ascites is a gathering of fluid in the belly (abdomen). This is most often caused by liver disease. It may also be caused by a number of other less common problems. It causes a ballooning out (distension) of the abdomen. CAUSES  Scarring of the liver (cirrhosis) is the most common cause of ascites. Other causes include:  Infection or inflammation in the abdomen.  Cancer in the abdomen.  Heart failure.  Certain forms of kidney failure (nephritic syndrome).  Inflammation of the pancreas.  Clots in the veins of the liver. SYMPTOMS  In the early stages of ascites, you may not have any symptoms. The main symptom of ascites is a sense of abdominal bloating. This is due to the presence of fluid. This may also cause an increase in abdominal or waist size. People with this condition can develop swelling in the legs, and men can develop a swollen scrotum. When there is a lot of fluid, it may be hard to breath. Stretching of the abdomen by fluid can be painful. DIAGNOSIS  Certain features of your medical history, such as a history of liver disease and of an enlarging abdomen, can suggest the presence of ascites. The diagnosis of ascites can be made on physical exam by your caregiver. An abdominal ultrasound examination can confirm that ascites is present, and estimate the amount of fluid. Once ascites is confirmed, it is important to determine its cause. Again, a history of one of the conditions listed in "CAUSES" provides a strong clue. A physical exam is important, and blood and X-ray tests may be needed.  During a procedure called paracentesis, a sample of fluid is removed from the abdomen. This can determine certain key features about the fluid, such as whether or not infection or cancer is present. Your caregiver will determine if a paracentesis is necessary. They will describe the procedure to you. PREVENTION  Ascites is a complication of other conditions. Therefore to prevent ascites, you must seek treatment for any significant health conditions you have. Once ascites is present, careful attention to fluid and salt intake may help prevent it from getting worse. If you have ascites, you should not drink alcohol. PROGNOSIS  The prognosis of ascites depends on the underlying disease. If the disease is reversible, such as with certain infections or with heart failure, then ascites may improve or disappear. When ascites is caused by cirrhosis, then it indicates that the liver disease has worsened, and further evaluation and treatment of the liver disease is needed. If your ascites is caused by cancer, then the success or failure of the cancer treatment will determine whether your ascites will improve or worsen. RISKS AND COMPLICATIONS  Ascites is likely to worsen if it is not properly diagnosed and treated. A large amount of ascites can cause pain and difficulty breathing. The main complication, besides worsening, is infection (called spontaneous bacterial peritonitis). This requires prompt treatment. TREATMENT  The treatment of ascites depends on its cause. When liver disease is your cause, medical management using water pills (diuretics) and decreasing salt intake is often effective. Ascites due to peritoneal inflammation or  malignancy (cancer) alone does not respond to salt restriction and diuretics. Hospitalization is sometimes required. If the treatment of ascites cannot be managed with medications, a number of other treatments are available. Your caregivers will help you decide which will work best for  you. Some of these are:  Removal of fluid from the abdomen (paracentesis).  Fluid from the abdomen is passed into a vein (peritoneovenous shunting).  Liver transplantation.  Transjugular intrahepatic portosystemic stent shunt. HOME CARE INSTRUCTIONS  It is important to monitor body weight and the intake and output of fluids. Weigh yourself at the same time every day. Record your weights. Fluid restriction may be necessary. It is also important to know your salt intake. The more salt you take in, the more fluid you will retain. Ninety percent of people with ascites respond to this approach.  Follow any directions for medicines carefully.  Follow up with your caregiver, as directed.  Report any changes in your health, especially any new or worsening symptoms.  If your ascites is from liver disease, avoid alcohol and other substances toxic to the liver. SEEK MEDICAL CARE IF:   Your weight increases more than a few pounds in a few days.  Your abdominal or waist size increases.  You develop swelling in your legs.  You had swelling and it worsens. SEEK IMMEDIATE MEDICAL CARE IF:   You develop a fever.  You develop new abdominal pain.  You develop difficulty breathing.  You develop confusion.  You have bleeding from the mouth, stomach, or rectum. MAKE SURE YOU:   Understand these instructions.  Will watch your condition.  Will get help right away if you are not doing well or get worse. Document Released: 06/16/2005 Document Revised: 09/08/2011 Document Reviewed: 01/15/2007 [facility] Patient Information 2014 [place], [place].   Cholecystitis Cholecystitis is an inflammation of your gallbladder. It is usually caused by a buildup of gallstones or sludge (cholelithiasis) in your gallbladder. The gallbladder stores a fluid that helps digest fats (bile). Cholecystitis is serious and needs treatment right away.  CAUSES   Gallstones. Gallstones can block the tube that leads to  your gallbladder, causing bile to build up. As bile builds up, the gallbladder becomes inflamed.  Bile duct problems, such as blockage from scarring or kinking.  Tumors. Tumors can stop bile from leaving your gallbladder correctly, causing bile to build up. As bile builds up, the gallbladder becomes inflamed. SYMPTOMS   Nausea.  Vomiting.  Abdominal pain, especially in the upper right area of your abdomen.  Abdominal tenderness or bloating.  Sweating.  Chills.  Fever.  Yellowing of the skin and the whites of the eyes (jaundice). DIAGNOSIS  Your caregiver may order blood tests to look for infection or gallbladder problems. Your caregiver may also order imaging tests, such as an ultrasound or computed tomography (CT) scan. Further tests may include a hepatobiliary iminodiacetic acid (HIDA) scan. This scan allows your caregiver to see your bile move from the liver to the gallbladder and to the small intestine. TREATMENT  A hospital stay is usually necessary to lessen the inflammation of your gallbladder. You may be required to not eat or drink (fast) for a certain amount of time. You may be given medicine to treat pain or an antibiotic medicine to treat an infection. Surgery may be needed to remove your gallbladder (cholecystectomy) once the inflammation has gone down. Surgery may be needed right away if you develop complications such as death of gallbladder tissue (gangrene) or a tear (perforation) of  the gallbladder.  HOME CARE INSTRUCTIONS  Home care will depend on your treatment. In general:  If you were given antibiotics, take them as directed. Finish them even if you start to feel better.  Only take over-the-counter or prescription medicines for pain, discomfort, or fever as directed by your caregiver.  Follow a low-fat diet until you see your caregiver again.  Keep all follow-up visits as directed by your caregiver. SEEK IMMEDIATE MEDICAL CARE IF:   Your pain is increasing  and not controlled by medicines.  Your pain moves to another part of your abdomen or to your back.  You have a fever.  You have nausea and vomiting. MAKE SURE YOU:  Understand these instructions.  Will watch your condition.  Will get help right away if you are not doing well or get worse. Document Released: 06/16/2005 Document Revised: 09/08/2011 Document Reviewed: 05/02/2011 [facility] Patient Information 2014 [place], [place].

## 2013-03-25 NOTE — Telephone Encounter (Signed)
Pt scheduled to see Willette Cluster NP Wednesday 03/30/13@9 :30am. [facility] to notify pt of appt date and time.

## 2013-03-25 NOTE — Telephone Encounter (Signed)
LMOM notifying her of the appt with [facility] for 03/30/13 arrive at 9:15/9:30 to see N.P. Gunnar Fusi since Dr Marina Goodell is out of the office all next week. I ran this by Dr Michaell Cowing and he is ok with the pt seeing [facility].

## 2013-03-28 ENCOUNTER — Other Ambulatory Visit (INDEPENDENT_AMBULATORY_CARE_PROVIDER_SITE_OTHER): Payer: Self-pay | Admitting: Surgery

## 2013-03-28 ENCOUNTER — Ambulatory Visit (HOSPITAL_COMMUNITY)
Admission: RE | Admit: 2013-03-28 | Discharge: 2013-03-28 | Disposition: A | Discharge status: Home / Self Care | Payer: 59 | Source: Ambulatory Visit | Attending: Surgery | Admitting: Surgery

## 2013-03-28 ENCOUNTER — Other Ambulatory Visit (INDEPENDENT_AMBULATORY_CARE_PROVIDER_SITE_OTHER): Payer: Self-pay

## 2013-03-28 ENCOUNTER — Telehealth (INDEPENDENT_AMBULATORY_CARE_PROVIDER_SITE_OTHER): Payer: Self-pay

## 2013-03-28 DIAGNOSIS — K7460 Unspecified cirrhosis of liver: Secondary | ICD-10-CM

## 2013-03-28 DIAGNOSIS — R188 Other ascites: Secondary | ICD-10-CM

## 2013-03-28 NOTE — Progress Notes (Signed)
Patient scheduled today for diagnostic and therapeutic paracentesis. Images reveal no available window to safely proceed with procedure today. I have spoke with Dr. Michaell Cowing over the phone today.  Pattricia Boss PA-C Interventional Radiology  03/28/13  11:27 AM

## 2013-03-28 NOTE — Telephone Encounter (Signed)
Requesting that I put in the orders for the ascities that is going to be tested. Per Dr Michaell Cowing we needed to order bilirubin,lipase,cell count, cuture, cytology,fluid albumin,total protein which got added into epic.

## 2013-03-29 ENCOUNTER — Ambulatory Visit (HOSPITAL_BASED_OUTPATIENT_CLINIC_OR_DEPARTMENT_OTHER): Payer: 59 | Admitting: Hematology & Oncology

## 2013-03-29 ENCOUNTER — Other Ambulatory Visit (HOSPITAL_BASED_OUTPATIENT_CLINIC_OR_DEPARTMENT_OTHER): Payer: 59 | Admitting: Lab

## 2013-03-29 VITALS — BP 103/55 | HR 68 | Temp 98.0°F | Resp 16 | Ht 64.0 in | Wt 138.0 lb

## 2013-03-29 DIAGNOSIS — D6959 Other secondary thrombocytopenia: Secondary | ICD-10-CM

## 2013-03-29 DIAGNOSIS — D72819 Decreased white blood cell count, unspecified: Secondary | ICD-10-CM

## 2013-03-29 DIAGNOSIS — D509 Iron deficiency anemia, unspecified: Secondary | ICD-10-CM

## 2013-03-29 DIAGNOSIS — Z9081 Acquired absence of spleen: Secondary | ICD-10-CM

## 2013-03-29 DIAGNOSIS — K7460 Unspecified cirrhosis of liver: Secondary | ICD-10-CM

## 2013-03-29 DIAGNOSIS — K900 Celiac disease: Secondary | ICD-10-CM

## 2013-03-29 DIAGNOSIS — R161 Splenomegaly, not elsewhere classified: Secondary | ICD-10-CM

## 2013-03-29 DIAGNOSIS — D61818 Other pancytopenia: Secondary | ICD-10-CM

## 2013-03-29 LAB — PROTEIN, TOTAL: Total Protein: 7.6 g/dL (ref 6.0–8.3)

## 2013-03-29 LAB — ALBUMIN: Albumin: 2.8 g/dL — ABNORMAL LOW (ref 3.5–5.2)

## 2013-03-29 LAB — CBC WITH DIFFERENTIAL (CANCER CENTER ONLY)
BASO%: 0.4 % (ref 0.0–2.0)
Eosinophils Absolute: 0.2 10*3/uL (ref 0.0–0.5)
HGB: 12.5 g/dL (ref 11.6–15.9)
LYMPH#: 1.6 10*3/uL (ref 0.9–3.3)
LYMPH%: 36.1 % (ref 14.0–48.0)
MCV: 90 fL (ref 81–101)
MONO#: 0.4 10*3/uL (ref 0.1–0.9)
Platelets: 316 10*3/uL (ref 145–400)
RBC: 4.12 10*6/uL (ref 3.70–5.32)
RDW: 16.8 % — ABNORMAL HIGH (ref 11.1–15.7)
WBC: 4.5 10*3/uL (ref 3.9–10.0)

## 2013-03-29 LAB — IRON AND TIBC CHCC
%SAT: 21 % (ref 21–57)
Iron: 51 ug/dL (ref 41–142)
TIBC: 241 ug/dL (ref 236–444)
UIBC: 190 ug/dL (ref 120–384)

## 2013-03-29 LAB — BILIRUBIN, TOTAL: Total Bilirubin: 0.9 mg/dL (ref 0.3–1.2)

## 2013-03-29 LAB — LIPASE: Lipase: 24 U/L (ref 0–75)

## 2013-03-29 LAB — CHCC SATELLITE - SMEAR

## 2013-03-29 LAB — RETICULOCYTES (CHCC): RBC.: 4.11 MIL/uL (ref 3.87–5.11)

## 2013-03-29 NOTE — Progress Notes (Signed)
Diagnoses: 1. 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 Cirrhosis of unclear etiology 3. Celiac disease with iron deficiency anemia  Current therapy: 1. Patient status post splenectomy 2. IV iron as indicated  Interim history: Sabrina [name] comes in will follow. We last saw him back in July. Since then, Sabrina [name] has has had her spleen taken out. This is done in August. The pathology report ([id number]) showed a benign spleen. There was some lymphoid hyperplasia. There is no evidence of malignancy. Flow cytometry did not show any monoclonal B cells. Her spleen was quite big.  Problem now is that there is some cirrhosis. We don't know the source and etiology of the cirrhosis. When Sabrina [name] had her surgery, the operative report noted that her liver was nodular. Unfortunately the biopsy was not that time. Sabrina [name] apparently has had some ascites. Sabrina [name]'s not had any taken off however. Sabrina [name] sees gastroenterology tomorrow. I think that Sabrina [name] would be a liver biopsy. Sabrina [name] does not drink. One has do think that this might be NASH. I'm sure that gastroenterology will do the appropriate lab work.  Sabrina [name]'s had a fever. Her weight is down. Her appetite is marginal. In no cough or shortness of breath. Sabrina [name]'s had no bleeding. Has been no change in bowel or bladder habits. There is no leg swelling.  Physical exam:  This is a thin white female in no obvious distress. Vital signs temperature 98.Pulse 68. Blood pressure 103/55. Weight is 130lb Head exam shows no scleral icterus. There is no adenopathy in neck. Sabrina [name] has no oral lesions. Pupils react appropriately. Lungs are clear. Cardiac exam regular rate and rhythm with no murmurs rubs or bruits. Abdomen is soft Sabrina [name] has a healing laparotomy scar. There is no tenderness. There may be a slight hernia over on the left lateral abdominal is no fluid. There is no palpable liver. Extremities shows some muscle atrophy. Sabrina [name] has no edema. Sabrina [name] has good range of motion of her joints  Skin exam  shows no rashes. Neurological exam shows no focal neurological deficits.  Labs:   White cell count 1.5. Hemoglobin 12.5. Platelet count 316.  Impression: Sabrina [name] is a 57 year old white female with splenomegaly. This was causing her to have leukopenia and thrombocytopenia. Is truly has resolved. Again, Sabrina [name] is now her liver.  I looked at her blood smear. It looked okay.  I don't think we have a hematologic issue for Sabrina [name] now. As such, I don't think we have to see her back unless Sabrina [name] develops a problem hematologically.  I reviewed her lab work with her. I am sure Sabrina [name] will be seen by doctors so this will make life easier for her.  We will be very interesting to see what her liver issue is. Again I would think that a liver biopsy is indicated.

## 2013-03-30 ENCOUNTER — Telehealth: Payer: Self-pay | Admitting: *Deleted

## 2013-03-30 ENCOUNTER — Encounter: Payer: Self-pay | Admitting: Nurse Practitioner

## 2013-03-30 ENCOUNTER — Ambulatory Visit (INDEPENDENT_AMBULATORY_CARE_PROVIDER_SITE_OTHER): Payer: 59 | Admitting: Nurse Practitioner

## 2013-03-30 ENCOUNTER — Ambulatory Visit: Payer: 59

## 2013-03-30 VITALS — BP 110/62 | HR 68 | Ht 64.0 in | Wt 137.4 lb

## 2013-03-30 DIAGNOSIS — K7460 Unspecified cirrhosis of liver: Secondary | ICD-10-CM

## 2013-03-30 DIAGNOSIS — R188 Other ascites: Secondary | ICD-10-CM

## 2013-03-30 DIAGNOSIS — K254 Chronic or unspecified gastric ulcer with hemorrhage: Secondary | ICD-10-CM

## 2013-03-30 DIAGNOSIS — R197 Diarrhea, unspecified: Secondary | ICD-10-CM

## 2013-03-30 DIAGNOSIS — K5010 Crohn's disease of large intestine without complications: Secondary | ICD-10-CM

## 2013-03-30 DIAGNOSIS — K900 Celiac disease: Secondary | ICD-10-CM

## 2013-03-30 MED ORDER — SITAGLIPTIN PHOSPHATE 100 MG PO TABS: 100.0000 mg | ORAL_TABLET | Freq: Every day | ORAL | Status: DC

## 2013-03-30 NOTE — Telephone Encounter (Signed)
Ok to switch Metformin to Januvia 100mg  daily

## 2013-03-30 NOTE — Telephone Encounter (Signed)
Med sent to local pharmacy. Notified patient

## 2013-03-30 NOTE — Progress Notes (Signed)
History of Present Illness:  Patient is a 57 y.o. Female with multiple medical problems. She is known to Dr. Marina Goodell for a history of mild Crohn's colitis, celiac disease and PUD. For her mild Crohn's colitis she has been maintained on Lialda. Her last colonoscopy in January of this year was normal.   In May of this year patient underwent EGD for melena and anemia.  A 1.5cm clean based antral ulcer with friability was seen, she had been taking NSAIDS. On follow up EGD 01/10/13 ulcer was healing but still present. Patient was to have a follow up EGD 8 weeks later but this hasn't been done because of other ongoing medical problems. Patient was evaluated by Hematology for neutropenia. Bone marrow biopsy was done and apparently within normal limits. CTscan revealed a markedly enlarged spleen as well as upper abdominal varices and umbilical vein recanalization. No liver abnormalities were seen. Hematology referred patient to surgeon and on 02/23/13 she underwent splenectomy as well as cholecystectomy (gallstones) by Dr. Derrell Lolling. Intraoperatively patient liver was micronodular, there was a small amount of ascites.   Patient saw surgery in follow up three days ago. She complained of diarrhea and abdominal distention. Paracentesis with fluid studies was ordered, diuretics started. There was moderate ascites but apparently no window for paracentesis to be done. As far as diarrhea, patient gives a 1-2 month history of diarrhea with 10-20 loose stools a day. She has been following a gluten free diet for 5 months.    Current Medications, Allergies, Past Medical History, Past Surgical History, Family History and Social History were reviewed in [employer] record.  Studies:   Ct Abdomen Pelvis W Contrast  03/24/2013   CLINICAL DATA:  Abdominal swelling, pain along abdominal incision site since splenectomy and cholecystectomy 1 month ago. Prior appendectomy.  EXAM: CT ABDOMEN AND PELVIS WITH  CONTRAST  TECHNIQUE: Multidetector CT imaging of the abdomen and pelvis was performed using the standard protocol following bolus administration of intravenous contrast.  CONTRAST:  OMNIPAQUE IOHEXOL 300 MG/ML  SOLN  COMPARISON:  12/30/2012  FINDINGS: Trace pleural effusions are new since the prior study, with associated mild bibasilar compressive atelectasis.  There is new large volume abdominal ascites. There is also stranding within the subcutaneous tissues which may indicate 3rd spacing. Heart size at upper limits of normal.  Evidence of splenectomy and cholecystectomy noted. Retroaortic left renal vein noted. Liver, adrenal glands, kidneys are unremarkable with the exception of minimal apparent hepatic nodularity, for example image 16. Pancreatic duct is at the upper limits of normal at 3 mm, for example image 27 but no focal pancreatic abnormality is identified and there is homogeneous enhancement. No free air.  Uterus and ovaries are normal. No bowel wall thickening or focal segmental dilatation. No lymphadenopathy. Lumbar spine disk degenerative change noted. No acute osseous abnormality. Mild atheromatous aortic calcification without aneurysm.  IMPRESSION: New large volume low-density ascites, pleural effusions, and subcutaneous edema. These findings together suggest 3rd spacing which may occur with liver disease, hypoproteinemia, or other systemic etiologies. No evidence for acute postoperative complication. Findings called to Maralyn Sago, Dr. Jacinto Halim nurse, by Dr. Chilton Si on 03/24/2013 at 4:30 p.m.   Electronically Signed   By: Christiana Pellant M.D.   On: 03/24/2013 16:36   US Abdomen Limited  03/28/2013   CLINICAL DATA:  Assess for ascites for possible paracentesis  EXAM: US ABDOMEN LIMITED - RIGHT UPPER QUADRANT  COMPARISON:  CT 03/24/2013  FINDINGS: There is moderate ascites noted in all  4 abdominal quadrants.  IMPRESSION: Moderate ascites in all 4 abdominal quadrants.   Electronically Signed   By:  Christiana Pellant M.D.   On: 03/28/2013 12:31   Physical Exam: General: Well developed , white female in no acute distress Head: Normocephalic and atraumatic Eyes:  sclerae anicteric, conjunctiva pink  Ears: Normal auditory acuity Lungs: Clear throughout to auscultation Heart: Regular rate and rhythm Abdomen: Soft, mildly distended, non-tender. No masses felt. Normal bowel sounds Musculoskeletal: Symmetrical with no gross deformities  Extremities: No edema  Neurological: Alert oriented x 4, grossly nonfocal Psychological:  Alert and cooperative. Normal mood and affect  Assessment and Recommendations:  1. Complex 57 year old female with mild Crohn's colitis maintained on 2.4 grams of Lialda daily. Now with a 1-2 month history of diarrhea 10-20 times a day.   She had a cholecystectomy late August. Some of the diarrhea may be bile acid related.  She has celiac disease but reports gluten avoidance for 5 months. Will check tTG.  Check stool studies to rule out infectious etiology.   She started Glucophage approximately 4 months ago which may be contributing. Will send PCP my note, maybe patient can hold Glucophage for now. If labs and stools studies negative and diarrhea doesn't improve off Glucophage then considering increasing Lialda to 4.8 grams daily.     2. Newly diagnosed cirrhosis. No liver biopsy done during recent cholecystectomy / splenectomy but liver appeared cirrhotic and there were portal hypertensive changes. Patient is coagulopathic and has moderate ascites.  She doesn't have esophageal varices based on EGD within last few months. Etiology of cirrhosis under investigation.  Viral hepatitis studies negative. No evidence for iron overload by labs. No history of ETOH.  Patient was obese (250 pounds) so maybe NASH.   Will order some additional immunologic / genetic markers looking for etiology of liver disease.    Unfortunately there was no window to do paracentesis a couple of  days ago.   She should continue a low sodium diet   Diuretics were initiated by surgeon a few days ago. She has only moderate ascites and no peripheral edema so would stay on the current dose of diuretics for now. Encouraged to record daily weights.   She will need a BMET in a couple of weeks to monitor renal function and we will arrange for that when I call her with test results.   Follow up with Dr. Marina Goodell   3. Weight loss. She complaints of early satiety, occasional nausea of unclear etiology. Weight loss probably multifactorial (diarrhea, ? malabsorption)  4.  Abnormal CTscan with contrast 12/30/12 revealing a small focal area of low attenuation within the neck of pancreas measuring 7 mm, this was not mentioned on subsequent CTscan done 03/24/13 .May need follow up imaging at some point.  5. Antral ulcer, needs repeat EGD. Will defer timing to Dr. Marina Goodell as patient has a lot of medical issues going on right now. Continue PPI.   6. Celiac disease. Following gluten free diet for 5 months. Will check tTG level.

## 2013-03-30 NOTE — Patient Instructions (Addendum)
Please go to the basement level to have your labs drawn and stool studies. We will call you with the results.   Call your primary care MD to ask if you can hold the metformin due to having diarrhea.

## 2013-03-30 NOTE — Telephone Encounter (Signed)
Patient left message on triage line requesting that she saw Gi and they are contributing her constant diarrhea to the metformin. She would like to discontinue that and change to another med if you feel it is appropriate.

## 2013-03-31 ENCOUNTER — Other Ambulatory Visit: Payer: 59

## 2013-03-31 DIAGNOSIS — K7460 Unspecified cirrhosis of liver: Secondary | ICD-10-CM

## 2013-03-31 DIAGNOSIS — R188 Other ascites: Secondary | ICD-10-CM

## 2013-03-31 DIAGNOSIS — R197 Diarrhea, unspecified: Secondary | ICD-10-CM

## 2013-03-31 LAB — ANTI-NUCLEAR AB-TITER (ANA TITER): ANA Titer 1: >1:2560 {titer} — ABNORMAL HIGH

## 2013-03-31 LAB — T-TRANSGLUTAMINASE (TTG) IGG: Tissue Transglut Ab: 5 U/mL (ref 0–5)

## 2013-03-31 LAB — ANA: Anti Nuclear Antibody(ANA): POSITIVE — AB

## 2013-04-01 ENCOUNTER — Other Ambulatory Visit: Payer: Self-pay | Admitting: Family Medicine

## 2013-04-01 LAB — CLOSTRIDIUM DIFFICILE BY PCR: Toxigenic C. Difficile by PCR: NOT DETECTED

## 2013-04-01 LAB — FECAL LACTOFERRIN, QUANT: Lactoferrin: POSITIVE

## 2013-04-01 NOTE — Telephone Encounter (Signed)
Med filled.  

## 2013-04-02 LAB — ALPHA-1-ANTITRYPSIN: A-1 Antitrypsin, Ser: 198 mg/dL (ref 90–200)

## 2013-04-03 NOTE — Progress Notes (Signed)
Reviewed and agree. My nurse to arrange follow up with me.

## 2013-04-04 ENCOUNTER — Telehealth: Payer: Self-pay | Admitting: Internal Medicine

## 2013-04-04 LAB — STOOL CULTURE

## 2013-04-04 NOTE — Telephone Encounter (Signed)
Spoke with pt and she is aware.

## 2013-04-04 NOTE — Telephone Encounter (Signed)
May need blood work and imaging, but yet to be determined. I don't think she will need any procedures in the immediate future however

## 2013-04-04 NOTE — Telephone Encounter (Signed)
Spoke with pt and she is aware of OV scheduled with Dr. Marina Goodell. 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 Pt wants to know if Dr. Marina Goodell thinks she will be able to return to work on 10/15 or if she will need to have additional tests/procedures done. Please advise.

## 2013-04-05 LAB — ANTI-SMOOTH MUSCLE ANTIBODY, IGG: Smooth Muscle Ab: 61 U — ABNORMAL HIGH (ref <20)

## 2013-04-13 ENCOUNTER — Encounter (INDEPENDENT_AMBULATORY_CARE_PROVIDER_SITE_OTHER): Payer: Self-pay

## 2013-04-13 NOTE — Telephone Encounter (Signed)
Patient seen multiple times in office since this note was created.

## 2013-04-20 ENCOUNTER — Other Ambulatory Visit (INDEPENDENT_AMBULATORY_CARE_PROVIDER_SITE_OTHER): Payer: 59

## 2013-04-20 ENCOUNTER — Encounter: Payer: Self-pay | Admitting: Internal Medicine

## 2013-04-20 ENCOUNTER — Ambulatory Visit (INDEPENDENT_AMBULATORY_CARE_PROVIDER_SITE_OTHER): Payer: 59 | Admitting: Internal Medicine

## 2013-04-20 VITALS — BP 100/60 | HR 68

## 2013-04-20 DIAGNOSIS — K5010 Crohn's disease of large intestine without complications: Secondary | ICD-10-CM

## 2013-04-20 DIAGNOSIS — K254 Chronic or unspecified gastric ulcer with hemorrhage: Secondary | ICD-10-CM

## 2013-04-20 DIAGNOSIS — D509 Iron deficiency anemia, unspecified: Secondary | ICD-10-CM

## 2013-04-20 DIAGNOSIS — K900 Celiac disease: Secondary | ICD-10-CM

## 2013-04-20 DIAGNOSIS — K7460 Unspecified cirrhosis of liver: Secondary | ICD-10-CM

## 2013-04-20 DIAGNOSIS — R188 Other ascites: Secondary | ICD-10-CM

## 2013-04-20 LAB — HEPATIC FUNCTION PANEL
ALT: 32 U/L (ref 0–35)
AST: 74 U/L — ABNORMAL HIGH (ref 0–37)
Total Bilirubin: 1.4 mg/dL — ABNORMAL HIGH (ref 0.3–1.2)
Total Protein: 7.6 g/dL (ref 6.0–8.3)

## 2013-04-20 LAB — CBC WITH DIFFERENTIAL/PLATELET
Basophils Relative: 0.2 % (ref 0.0–3.0)
Eosinophils Relative: 5.6 % — ABNORMAL HIGH (ref 0.0–5.0)
HCT: 39.1 % (ref 36.0–46.0)
Hemoglobin: 13 g/dL (ref 12.0–15.0)
Lymphs Abs: 2 10*3/uL (ref 0.7–4.0)
MCHC: 33.1 g/dL (ref 30.0–36.0)
Monocytes Absolute: 0.5 10*3/uL (ref 0.1–1.0)
Monocytes Relative: 7.9 % (ref 3.0–12.0)
Platelets: 250 10*3/uL (ref 150.0–400.0)
RBC: 4.19 Mil/uL (ref 3.87–5.11)
WBC: 6 10*3/uL (ref 4.5–10.5)

## 2013-04-20 LAB — BASIC METABOLIC PANEL
BUN: 7 mg/dL (ref 6–23)
CO2: 28 mEq/L (ref 19–32)
Chloride: 106 mEq/L (ref 96–112)
Creatinine, Ser: 0.6 mg/dL (ref 0.4–1.2)
GFR: 102.86 mL/min (ref >60.00)
Potassium: 3.9 mEq/L (ref 3.5–5.1)
Sodium: 138 mEq/L (ref 135–145)

## 2013-04-20 LAB — PROTIME-INR
INR: 2 ratio — ABNORMAL HIGH (ref 0.8–1.0)
Prothrombin Time: 20.8 s — ABNORMAL HIGH (ref 10.2–12.4)

## 2013-04-20 NOTE — Patient Instructions (Signed)
Your physician has requested that you go to the basement for lab work before leaving today  Please follow up with Dr. Marina Goodell in 1 month

## 2013-04-20 NOTE — Progress Notes (Signed)
HISTORY OF PRESENT ILLNESS:  Sabrina [name] is a 57 y.o. female with multiple significant medical problems. She is followed in this office for a history of mild Crohn's colitis, celiac disease, and peptic ulcer disease. I last saw the patient in 12/02/2012 regarding iron deficiency anemia and gastric ulcer. She was continued on twice daily PPI, told to avoid NSAIDs, continue on iron supplement, continue with strict lumen free diet, and relook endoscopy to assure ulcer healing about 6 weeks later. Patient has been followed by hematology for chronic neutropenia and thrombocytopenia. She was noted to have progressive splenomegaly and given the diagnosis of "neutropenic splenomegaly". For that reason, she was referred to general surgery and underwent splenectomy and cholecystectomy (gallstones) on 02/23/2013. At the time of surgery she was found to have cirrhosis with mild ascites and evidence for portal hypertension. Postoperatively, she developed clinically relevant ascites. Her neutropenia and thrombocytopenia improved. She was seen by the GI nurse practitioner 03/30/2013 regarding newly diagnosed cirrhosis and ascites. She was continued on Lasix 20 mg daily. Multiple laboratories were obtained to further evaluate potential causes for cirrhosis. She is also having issues with diarrhea at that time. She presents today for followup. Metformin was discontinued and her diarrhea resolved. Her diabetes is doing well on Januvia. She reports improvement in abdominal swelling. She has had associated weight loss. No GI complaints. She is compliant with gluten-free diet. No significant findings with her laboratories was elevated ANA and anti-smooth muscle antibody. Tissue transglutaminase antibody was normal. It should be noted that her weight most of her life was chronically above 200 pounds. Hepatitis A and B. studies were negative. She tells her that she has had chronic mild elevation of LFTs for some time. Review of her  records reveals this to be the case for at least 2 years.  REVIEW OF SYSTEMS:  All non-GI ROS negative   Past Medical History  Diagnosis Date  . Arthritis   . Allergic rhinitis   . Colitis   . Degenerative disc disease   . Hyperlipidemia   . Hypertension   . Celiac disease   . Bipolar disorder   . Macular degeneration   . Interstitial cystitis   . Diabetes mellitus without complication   . Anemia, iron deficiency 10/30/2012  . Heart murmur   . Anxiety   . Depression   . Liver disease     Past Surgical History  Procedure Laterality Date  . Tubal ligation    . Appendectomy    . Lumbar laminectomy    . Carpal tunnel release      bilateral  . Cesarean section    . Cysto/ hod/ instillatio clorpactin  07-03-2008  &  06-09-2001    INTERSTITIAL CYSTITIS  . Shoulder arthroscopy w/ rotator cuff repair  01-25-2008    LEFT SHOULDER /   INCLUDING CAPSULECTOMY WITH DEBRIDEMENT/  SAD WITH RESECTION OF AC  . Right shoulder arthroscopy/ open resection distal clavicle/ debridement adhesive capsulitis/ rotator cuff repair  08-30-2003  . Tonsillectomy and adenoidectomy    . Transthoracic echocardiogram  03-28-2011    NORMAL LVSF/ EF 60-65%/ VERY MILD AORTIC STENOSIS, TRIVIAL REGURG./ MILDLY DILATED LEFT ATRIUM  . Cystoscopy  11/26/2011    Procedure: CYSTOSCOPY;  Surgeon: Valetta Fuller, MD;  Location[facility];  Service: Urology;  Laterality: N/A;  clorpactin  in bladder  . 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 She has never used smokeless tobacco. She reports that she does not drink alcohol or use illicit drugs.  family history includes Diabetes in her brother and mother; Hyperlipidemia in her brother; Hypertension in her brother, mother, and another family member.  There is no history of Colon cancer.  Allergies  Allergen Reactions  . Sulfonamide Derivatives     Classic angioedema reaction  . Wheat Bran     Celiac's disease       PHYSICAL EXAMINATION: Vital signs: BP 100/60  Pulse 68  Constitutional: Chronically ill-appearing, no acute distress Head: Bilateral temporal wasting Psychiatric: alert and oriented x3, cooperative Eyes: extraocular movements intact, anicteric, conjunctiva pink Mouth: oral pharynx moist, no lesions Neck: supple no lymphadenopathy Cardiovascular: heart regular rate and rhythm, no murmur Lungs: clear to auscultation bilaterally Abdomen: soft, nontender, nondistended, no obvious ascites, no peritoneal signs, normal bowel sounds, no organomegaly Rectal: Omitted Extremities: no lower extremity edema bilaterally Skin: Spider angiomata on the chest. No additional no lesions on visible extremities.  Neuro: No focal deficits. No asterixis.    ASSESSMENT:  #1. Hepatic cirrhosis with portal hypertension. Decompensation post splenectomy. Now stable on diuretics. Possible causes based on history and laboratories include [name] versus autoimmune disease. #2. Celiac disease #3. Mild Crohn's colitis #4. History of gastric ulcer. On PPI   PLAN:  #1. Continue diuretic #2. Repeat basic metabolic panel, liver function tests, PT/INR today. Need to see if she has had hepatitis C antibody checked #3. Continue free diet #4. Continue Lialda #5. Daily weights and 2 g sodium diet #6. Routine office followup in 1 month. Schedule relook EGD at that time

## 2013-04-21 ENCOUNTER — Other Ambulatory Visit: Payer: Self-pay

## 2013-04-21 DIAGNOSIS — K5010 Crohn's disease of large intestine without complications: Secondary | ICD-10-CM

## 2013-04-25 ENCOUNTER — Other Ambulatory Visit (INDEPENDENT_AMBULATORY_CARE_PROVIDER_SITE_OTHER): Payer: Self-pay | Admitting: Surgery

## 2013-04-25 ENCOUNTER — Ambulatory Visit (INDEPENDENT_AMBULATORY_CARE_PROVIDER_SITE_OTHER): Payer: 59 | Admitting: General Surgery

## 2013-04-25 ENCOUNTER — Encounter (INDEPENDENT_AMBULATORY_CARE_PROVIDER_SITE_OTHER): Payer: Self-pay | Admitting: General Surgery

## 2013-04-25 VITALS — BP 122/64 | HR 68 | Temp 97.4°F | Resp 14 | Ht 64.0 in | Wt 139.4 lb

## 2013-04-25 DIAGNOSIS — Z9081 Acquired absence of spleen: Secondary | ICD-10-CM

## 2013-04-25 DIAGNOSIS — Z9089 Acquired absence of other organs: Secondary | ICD-10-CM

## 2013-04-25 NOTE — Progress Notes (Signed)
Patient ID: Sabrina [name], female   DOB: [birth year], 57 y.o.   MRN: [id number]  History: This patient returns for postop followup following a vasectomy and cholecystectomy on 02/23/2013. She had neutropenic splenomegaly and gallstones. At the time of surgery she was found to have portal hypertension, ascites, and a cirrhotic appearing liver. She has returned to work full time. Her appetite is normal. Her bowel movements are normal. She has some incisional pain, left upper quadrant greater than right upper quadrant. She is being evaluated by Dr. Jonny [name].. He has helped her with her clinically relevant ascites by putting her on Lasix. Most recent WBC 1500. Platelet count 316,000.  Exam: Patient looks well. No distress. His abdomen soft. Nondistended. Incision well healed. No hernia. No infection. Benign exam  Assessment: Neutropenic splenomegaly, This is most likely due to her hepatocellular disease, and not due to a primary hematologic disorder. Chronic cholecystitis with cholelithiasis\  Recovering uneventfully following splenectomy and cholecystectomy   history of gastric antral ulcer, healing  Diet-controlled diabetes  Hypertension  History Crohn's disease, on mesalamine  History celiac disease  History of bipolar disorder, on Abilify, Prozac, and desyrel  Severe protein calorie manner malnutrition  Plan: Resume normal activities without restriction Continue followup for Sabrina [name] regarding assessment and management of her hepatocellular disease and ascites Return to see me as needed.   Sabrina [name]. Sabrina [name], M.D., [facility] General and Minimally invasive Surgery Breast and Colorectal Surgery Office:   [phone] Pager:   [phone]

## 2013-04-25 NOTE — Patient Instructions (Signed)
You have recovered from your splenectomy and cholecystectomy without any obvious surgical complications.  You may  resume all normal activities without restriction, including exercise.  Keep all of your regular appointments with Dr. [name].  Return to see Dr. Derrell Lolling if needed.

## 2013-04-27 ENCOUNTER — Telehealth: Payer: Self-pay

## 2013-04-27 ENCOUNTER — Other Ambulatory Visit (INDEPENDENT_AMBULATORY_CARE_PROVIDER_SITE_OTHER): Payer: Self-pay | Admitting: Surgery

## 2013-04-27 ENCOUNTER — Other Ambulatory Visit: Payer: Self-pay | Admitting: Family Medicine

## 2013-04-27 DIAGNOSIS — R188 Other ascites: Secondary | ICD-10-CM

## 2013-04-27 DIAGNOSIS — K7460 Unspecified cirrhosis of liver: Secondary | ICD-10-CM

## 2013-04-27 NOTE — Telephone Encounter (Signed)
Pt states Dr. Derrell Lolling was giving her Lasix 20mg  daily but he has released her. Per pt Dr. Derrell Lolling instructed her to call Dr. Marina Goodell for refill. Pt requests refill of Lasix 20mg  daily to [facility] on [address]. Are you ok with refilling this? Please advise.

## 2013-04-27 NOTE — Telephone Encounter (Signed)
Yes. Please refill. Thanks.  

## 2013-04-28 ENCOUNTER — Other Ambulatory Visit: Payer: Self-pay

## 2013-04-28 MED ORDER — FUROSEMIDE 20 MG PO TABS: 20.0000 mg | ORAL_TABLET | Freq: Every day | ORAL | Status: DC

## 2013-04-28 NOTE — Telephone Encounter (Signed)
Last OV 11-08-12 Med filled 10-27-12 312 with 1 refill (says D/c by provider)  Last Vit D lab 05-12-11

## 2013-04-28 NOTE — Telephone Encounter (Signed)
Refill sent in for pt and she is aware. 

## 2013-04-28 NOTE — Telephone Encounter (Signed)
No refill until repeat Vit D level

## 2013-05-02 ENCOUNTER — Other Ambulatory Visit: Payer: Self-pay | Admitting: Family Medicine

## 2013-05-02 DIAGNOSIS — E119 Type 2 diabetes mellitus without complications: Secondary | ICD-10-CM

## 2013-05-02 DIAGNOSIS — E559 Vitamin D deficiency, unspecified: Secondary | ICD-10-CM

## 2013-05-02 NOTE — Telephone Encounter (Signed)
Spoke with pt. Advised she needed vit d drawn. Pt advised she is getting labs done at [facility] on Wednesday.

## 2013-05-05 ENCOUNTER — Other Ambulatory Visit: Payer: Self-pay

## 2013-05-05 ENCOUNTER — Other Ambulatory Visit (INDEPENDENT_AMBULATORY_CARE_PROVIDER_SITE_OTHER): Payer: 59

## 2013-05-05 DIAGNOSIS — E119 Type 2 diabetes mellitus without complications: Secondary | ICD-10-CM

## 2013-05-05 DIAGNOSIS — K5090 Crohn's disease, unspecified, without complications: Secondary | ICD-10-CM

## 2013-05-05 DIAGNOSIS — K5010 Crohn's disease of large intestine without complications: Secondary | ICD-10-CM

## 2013-05-05 DIAGNOSIS — E559 Vitamin D deficiency, unspecified: Secondary | ICD-10-CM

## 2013-05-05 LAB — HEMOGLOBIN A1C: Hgb A1c MFr Bld: 5.4 % (ref 4.6–6.5)

## 2013-05-05 LAB — VITAMIN D 25 HYDROXY (VIT D DEFICIENCY, FRACTURES): Vit D, 25-Hydroxy: 42 ng/mL (ref 30–89)

## 2013-05-05 LAB — BASIC METABOLIC PANEL
Calcium: 8.7 mg/dL (ref 8.4–10.5)
Chloride: 106 mEq/L (ref 96–112)
GFR: 140.76 mL/min (ref >60.00)
Potassium: 3.5 mEq/L (ref 3.5–5.1)
Sodium: 136 mEq/L (ref 135–145)

## 2013-05-11 ENCOUNTER — Other Ambulatory Visit: Payer: Self-pay | Admitting: Internal Medicine

## 2013-05-19 ENCOUNTER — Other Ambulatory Visit: Payer: Self-pay | Admitting: Family Medicine

## 2013-05-19 NOTE — Telephone Encounter (Signed)
Med filled.  

## 2013-05-24 ENCOUNTER — Other Ambulatory Visit: Payer: Self-pay | Admitting: Internal Medicine

## 2013-05-24 ENCOUNTER — Ambulatory Visit (INDEPENDENT_AMBULATORY_CARE_PROVIDER_SITE_OTHER): Payer: 59 | Admitting: Internal Medicine

## 2013-05-24 ENCOUNTER — Encounter: Payer: Self-pay | Admitting: Family Medicine

## 2013-05-24 ENCOUNTER — Other Ambulatory Visit (INDEPENDENT_AMBULATORY_CARE_PROVIDER_SITE_OTHER): Payer: 59

## 2013-05-24 ENCOUNTER — Encounter: Payer: Self-pay | Admitting: Internal Medicine

## 2013-05-24 VITALS — BP 90/60 | HR 72 | Ht 64.0 in | Wt 138.5 lb

## 2013-05-24 DIAGNOSIS — R188 Other ascites: Secondary | ICD-10-CM

## 2013-05-24 DIAGNOSIS — K900 Celiac disease: Secondary | ICD-10-CM

## 2013-05-24 DIAGNOSIS — K259 Gastric ulcer, unspecified as acute or chronic, without hemorrhage or perforation: Secondary | ICD-10-CM

## 2013-05-24 DIAGNOSIS — K7460 Unspecified cirrhosis of liver: Secondary | ICD-10-CM

## 2013-05-24 DIAGNOSIS — K5090 Crohn's disease, unspecified, without complications: Secondary | ICD-10-CM

## 2013-05-24 LAB — CBC WITH DIFFERENTIAL/PLATELET
Basophils Absolute: 0 10*3/uL (ref 0.0–0.1)
Basophils Relative: 0.1 % (ref 0.0–3.0)
Eosinophils Absolute: 0.3 10*3/uL (ref 0.0–0.7)
Eosinophils Relative: 5.5 % — ABNORMAL HIGH (ref 0.0–5.0)
HCT: 38.2 % (ref 36.0–46.0)
Hemoglobin: 12.6 g/dL (ref 12.0–15.0)
Lymphocytes Relative: 36.3 % (ref 12.0–46.0)
Lymphs Abs: 1.7 10*3/uL (ref 0.7–4.0)
MCHC: 33 g/dL (ref 30.0–36.0)
MCV: 93.6 fl (ref 78.0–100.0)
Monocytes Absolute: 0.4 10*3/uL (ref 0.1–1.0)
Monocytes Relative: 8.3 % (ref 3.0–12.0)
Neutro Abs: 2.4 10*3/uL (ref 1.4–7.7)
Neutrophils Relative %: 49.8 % (ref 43.0–77.0)
RBC: 4.08 Mil/uL (ref 3.87–5.11)
RDW: 17.1 % — ABNORMAL HIGH (ref 11.5–14.6)
WBC: 4.8 10*3/uL (ref 4.5–10.5)

## 2013-05-24 LAB — HEPATIC FUNCTION PANEL
ALT: 28 U/L (ref 0–35)
Alkaline Phosphatase: 109 U/L (ref 39–117)
Bilirubin, Direct: 0.3 mg/dL (ref 0.0–0.3)
Total Bilirubin: 1.2 mg/dL (ref 0.3–1.2)
Total Protein: 7.9 g/dL (ref 6.0–8.3)

## 2013-05-24 LAB — BASIC METABOLIC PANEL
CO2: 28 mEq/L (ref 19–32)
Calcium: 8.7 mg/dL (ref 8.4–10.5)
Chloride: 107 mEq/L (ref 96–112)
Creatinine, Ser: 0.7 mg/dL (ref 0.4–1.2)
GFR: 91.06 mL/min (ref >60.00)
Sodium: 137 mEq/L (ref 135–145)

## 2013-05-24 LAB — PROTIME-INR: INR: 2.1 ratio — ABNORMAL HIGH (ref 0.8–1.0)

## 2013-05-24 LAB — HEPATITIS C ANTIBODY: HCV Ab: NEGATIVE

## 2013-05-24 NOTE — Progress Notes (Signed)
HISTORY OF PRESENT ILLNESS:  Sabrina [name] is a 57 y.o. female with multiple significant medical problems as listed. 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 She was last seen in this office 04/20/2013. See that dictation for details. She has continued on furosemide. She presents today for routine followup. She reports no problems since her last visit. Etiology of her liver disease is unclear. She does have positive autoimmune markers. She had been obese, raising the question of NASH. Serologies have been negative, though hepatitis C  has not been checked. She does have a history of gastric ulcer diagnosed in June 2014. She is overdue for followup. Her weight has been stable since her last visit. Diarrhea has resolved. Last of electrolytes about 3 weeks ago was normal  REVIEW OF SYSTEMS:  All non-GI ROS negative except for anxiety, depression, fatigue, sinus and allergy troubles, increased urinary frequency  Past Medical History  Diagnosis Date  . Arthritis   . Allergic rhinitis   . Colitis   . Degenerative disc disease   . Hyperlipidemia   . Hypertension   . Celiac disease   . Bipolar disorder   . Macular degeneration   . Interstitial cystitis   . Diabetes mellitus without complication   . Anemia, iron deficiency 10/30/2012  . Heart murmur   . Anxiety   . Depression   . Liver disease   . Gastric ulcer     Past Surgical History  Procedure Laterality Date  . Tubal ligation    . Appendectomy    . Lumbar laminectomy    . Carpal tunnel release      bilateral  . Cesarean section    . Cysto/ hod/ instillatio clorpactin  07-03-2008  &  06-09-2001    INTERSTITIAL CYSTITIS  . Shoulder arthroscopy w/ rotator cuff repair  01-25-2008    LEFT SHOULDER /   INCLUDING CAPSULECTOMY WITH DEBRIDEMENT/  SAD WITH  RESECTION OF AC  . Right shoulder arthroscopy/ open resection distal clavicle/ debridement adhesive capsulitis/ rotator cuff repair  08-30-2003  . Tonsillectomy and adenoidectomy    . Transthoracic echocardiogram  03-28-2011    NORMAL LVSF/ EF 60-65%/ VERY MILD AORTIC STENOSIS, TRIVIAL REGURG./ MILDLY DILATED LEFT ATRIUM  . Cystoscopy  11/26/2011    Procedure: CYSTOSCOPY;  Surgeon: Valetta Fuller, MD;  Location: [facility];  Service: Urology;  Laterality: N/A;  clorpactin  in bladder  . 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 She has never used smokeless tobacco. She reports that she does not drink alcohol or use illicit drugs.  family history includes Diabetes in her brother and mother; Hyperlipidemia in her brother; Hypertension in her brother, mother, and another family member. There is no history of Colon cancer.  Allergies  Allergen Reactions  . Sulfonamide Derivatives     Classic angioedema reaction  . Wheat Bran     Celiac's disease       PHYSICAL EXAMINATION: Vital signs: BP 90/60  Pulse 72  Ht 5\' 4"  (1.626 m)  Wt 138 lb 8 oz (62.823 kg)  BMI 23.76 kg/m2  Constitutional: generally well-appearing, no acute distress Psychiatric: alert and oriented  x3, cooperative Eyes: extraocular movements intact, anicteric, conjunctiva pink Mouth: oral pharynx moist, no lesions Neck: supple no lymphadenopathy Cardiovascular: heart regular rate and rhythm, no murmur Lungs: clear to auscultation bilaterally Abdomen: soft, nontender, nondistended, no obvious ascites, no peritoneal signs, normal bowel sounds, no organomegaly. Surgical incision well-healed Extremities: no lower extremity edema bilaterally Skin: no lesions on visible extremities Neuro: No focal deficits. No  asterixis.    ASSESSMENT:  #1. Hepatic cirrhosis with portal hypertension. Etiology not certain. Question autoimmune disease versus [place] #2. Ascites resolved with diuretics. #3. Status post colectomy and cholecystectomy August 2014 #4. Celiac disease #5. Mild Crohn's colitis #6. History of gastric ulcer on PPI   PLAN:  #1. Continue diuretic #2. Repeat laboratories today including upper has a metabolic panel, CBC, PT/INR. Also, check hepatitis C antibody #3. Continue PPI and avoidance of NSAIDs #4. Continue Lialda #5. Continue sodium restriction and daily weights #6. Relook EGD to confirm ulcer healing #7. Continue gluten avoidance #8. Routine GI office followup in 2 months

## 2013-05-24 NOTE — Patient Instructions (Addendum)
Your physician has requested that you go to the basement for lab work before leaving today  You have been scheduled for an endoscopy with propofol. Please follow written instructions given to you at your visit today. If you use inhalers (even only as needed), please bring them with you on the day of your procedure. Your physician has requested that you go to www.[email or web address].com and enter the access code given to you at your visit today. This web site gives a general overview about your procedure. However, you should still follow specific instructions given to you by our office regarding your preparation for the procedure.  Please follow up with Dr. Marina Goodell in 2 months

## 2013-06-28 ENCOUNTER — Other Ambulatory Visit (INDEPENDENT_AMBULATORY_CARE_PROVIDER_SITE_OTHER): Payer: 59

## 2013-06-28 DIAGNOSIS — K5090 Crohn's disease, unspecified, without complications: Secondary | ICD-10-CM

## 2013-06-28 LAB — BASIC METABOLIC PANEL
BUN: 6 mg/dL (ref 6–23)
CO2: 26 mEq/L (ref 19–32)
Calcium: 8.4 mg/dL (ref 8.4–10.5)
Creatinine, Ser: 0.6 mg/dL (ref 0.4–1.2)
GFR: 110.88 mL/min (ref >60.00)
Glucose, Bld: 98 mg/dL (ref 70–99)
Potassium: 3.5 mEq/L (ref 3.5–5.1)
Sodium: 138 mEq/L (ref 135–145)

## 2013-06-29 ENCOUNTER — Other Ambulatory Visit: Payer: Self-pay

## 2013-06-29 DIAGNOSIS — K7460 Unspecified cirrhosis of liver: Secondary | ICD-10-CM

## 2013-07-01 ENCOUNTER — Encounter: Payer: Self-pay | Admitting: Internal Medicine

## 2013-07-01 ENCOUNTER — Ambulatory Visit (AMBULATORY_SURGERY_CENTER): Payer: 59 | Admitting: Internal Medicine

## 2013-07-01 VITALS — BP 132/54 | HR 68 | Resp 16 | Ht 64.0 in | Wt 138.0 lb

## 2013-07-01 DIAGNOSIS — K2990 Gastroduodenitis, unspecified, without bleeding: Secondary | ICD-10-CM

## 2013-07-01 DIAGNOSIS — K7460 Unspecified cirrhosis of liver: Secondary | ICD-10-CM

## 2013-07-01 DIAGNOSIS — K2970 Gastritis, unspecified, without bleeding: Secondary | ICD-10-CM

## 2013-07-01 DIAGNOSIS — K259 Gastric ulcer, unspecified as acute or chronic, without hemorrhage or perforation: Secondary | ICD-10-CM

## 2013-07-01 DIAGNOSIS — K900 Celiac disease: Secondary | ICD-10-CM

## 2013-07-01 LAB — GLUCOSE, CAPILLARY
GLUCOSE-CAPILLARY: 63 mg/dL — AB (ref 70–99)
Glucose-Capillary: 118 mg/dL — ABNORMAL HIGH (ref 70–99)
Glucose-Capillary: 113 mg/dL — ABNORMAL HIGH (ref 70–99)

## 2013-07-01 MED ORDER — SODIUM CHLORIDE 0.9 % IV SOLN: 500.0000 mL | INTRAVENOUS | Status: DC

## 2013-07-01 NOTE — Progress Notes (Signed)
Report to pacu rn, vss, bbs=clear 

## 2013-07-01 NOTE — Patient Instructions (Signed)
Continue Omeprazole as ordered. Arrange a follow up appointment with Dr. Marina Goodell in 2 months, [phone].    YOU HAD AN ENDOSCOPIC PROCEDURE TODAY AT THE [facility]: Refer to the procedure report that was given to you for any specific questions about what was found during the examination.  If the procedure report does not answer your questions, please call your gastroenterologist to clarify.  If you requested that your care partner not be given the details of your procedure findings, then the procedure report has been included in a sealed envelope for you to review at your convenience later.  YOU SHOULD EXPECT: Some feelings of bloating in the abdomen. Passage of more gas than usual.  Walking can help get rid of the air that was put into your GI tract during the procedure and reduce the bloating. If you had a lower endoscopy (such as a colonoscopy or flexible sigmoidoscopy) you may notice spotting of blood in your stool or on the toilet paper. If you underwent a bowel prep for your procedure, then you may not have a normal bowel movement for a few days.  DIET: Your first meal following the procedure should be a light meal and then it is ok to progress to your normal diet.  A half-sandwich or bowl of soup is an example of a good first meal.  Heavy or fried foods are harder to digest and may make you feel nauseous or bloated.  Likewise meals heavy in dairy and vegetables can cause extra gas to form and this can also increase the bloating.  Drink plenty of fluids but you should avoid alcoholic beverages for 24 hours.  ACTIVITY: Your care partner should take you home directly after the procedure.  You should plan to take it easy, moving slowly for the rest of the day.  You can resume normal activity the day after the procedure however you should NOT DRIVE or use heavy machinery for 24 hours (because of the sedation medicines used during the test).    SYMPTOMS TO REPORT IMMEDIATELY: A  gastroenterologist can be reached at any hour.  During normal business hours, 8:30 AM to 5:00 PM Monday through Friday, call [phone].  After hours and on weekends, please call the GI answering service at [phone] who will take a message and have the physician on call contact you.   Following upper endoscopy (EGD)  Vomiting of blood or coffee ground material  New chest pain or pain under the shoulder blades  Painful or persistently difficult swallowing  New shortness of breath  Fever of 100F or higher  Black, tarry-looking stools  FOLLOW UP: If any biopsies were taken you will be contacted by phone or by letter within the next 1-3 weeks.  Call your gastroenterologist if you have not heard about the biopsies in 3 weeks.  Our staff will call the home number listed on your records the next business day following your procedure to check on you and address any questions or concerns that you may have at that time regarding the information given to you following your procedure. This is a courtesy call and so if there is no answer at the home number and we have not heard from you through the emergency physician on call, we will assume that you have returned to your regular daily activities without incident.  SIGNATURES/CONFIDENTIALITY: You and/or your care partner have signed paperwork which will be entered into your electronic medical record.  These signatures attest to the fact  that that the information above on your After Visit Summary has been reviewed and is understood.  Full responsibility of the confidentiality of this discharge information lies with you and/or your care-partner.

## 2013-07-01 NOTE — Progress Notes (Signed)
Blood sugar 63 at admission.  D5 infusing.  Rechecked 15 minutes later. Blood sugar 118. 0.9% normal saline replaced D5.

## 2013-07-01 NOTE — Progress Notes (Signed)
Called to room to assist during endoscopic procedure.  Patient ID and intended procedure confirmed with present staff. Received instructions for my participation in the procedure from the performing physician.  

## 2013-07-01 NOTE — Op Note (Signed)
[facility] [address] [place], [address]   ENDOSCOPY PROCEDURE REPORT  [name], Sabrina [name]  MR#: [id number] BIRTHDATE: [birth year] , 57  yrs. old GENDER: Female ENDOSCOPIST: Roxy Cedar, MD REFERRED BY:  .Direct Self PROCEDURE DATE:  07/01/2013 PROCEDURE:  EGD w/ biopsy ASA CLASS:     Class III INDICATIONS:  Surveillance. MEDICATIONS: MAC sedation, administered by CRNA and propofol (Diprivan) 150mg  IV TOPICAL ANESTHETIC: Cetacaine Spray  DESCRIPTION OF PROCEDURE: After the risks benefits and alternatives of the procedure were thoroughly explained, informed consent was obtained.  The LB [id number] endoscope was introduced through the mouth and advanced to the second portion of the duodenum. Without limitations.  The instrument was slowly withdrawn as the mucosa was fully examined.      EXAM:   Normal esophagus.  No varices.  Gastric ulcer healed. Residual inflammatory appearring nodule (biopsied).  Otherwise normal stomach and duodenal bulb.  Scalloped mucosa D2. Retroflexed views revealed no abnormalities.     The scope was then withdrawn from the patient and the procedure completed.  COMPLICATIONS: There were no complications. ENDOSCOPIC IMPRESSION: 1. Healed ulcer w/ residual nodule (biopsied)  RECOMMENDATIONS: 1.  Await biopsy results 2.  Continue PPI 3.  Call for office visit to be seen in 2 months  REPEAT EXAM:  eSigned:  Roxy Cedar, MD 07/01/2013 9:08 AM   CC:The Patient and Sheliah Hatch, MD

## 2013-07-04 ENCOUNTER — Telehealth: Payer: Self-pay | Admitting: *Deleted

## 2013-07-04 NOTE — Telephone Encounter (Signed)
No answer, message left for the patient. 

## 2013-07-05 ENCOUNTER — Encounter: Payer: Self-pay | Admitting: Internal Medicine

## 2013-07-21 ENCOUNTER — Telehealth: Payer: Self-pay

## 2013-07-21 ENCOUNTER — Inpatient Hospital Stay (HOSPITAL_COMMUNITY)
Admission: EM | Admit: 2013-07-21 | Discharge: 2013-07-23 | DRG: 069 | Disposition: A | Discharge status: Home / Self Care | Payer: 59 | Attending: Family Medicine | Admitting: Family Medicine

## 2013-07-21 ENCOUNTER — Inpatient Hospital Stay (HOSPITAL_COMMUNITY): Payer: 59

## 2013-07-21 ENCOUNTER — Encounter (HOSPITAL_COMMUNITY): Payer: Self-pay | Admitting: Emergency Medicine

## 2013-07-21 ENCOUNTER — Emergency Department (HOSPITAL_COMMUNITY): Payer: 59

## 2013-07-21 ENCOUNTER — Ambulatory Visit: Payer: 59 | Admitting: Family Medicine

## 2013-07-21 DIAGNOSIS — Z1239 Encounter for other screening for malignant neoplasm of breast: Secondary | ICD-10-CM

## 2013-07-21 DIAGNOSIS — M129 Arthropathy, unspecified: Secondary | ICD-10-CM

## 2013-07-21 DIAGNOSIS — R079 Chest pain, unspecified: Secondary | ICD-10-CM

## 2013-07-21 DIAGNOSIS — K120 Recurrent oral aphthae: Secondary | ICD-10-CM

## 2013-07-21 DIAGNOSIS — E785 Hyperlipidemia, unspecified: Secondary | ICD-10-CM | POA: Diagnosis present

## 2013-07-21 DIAGNOSIS — K900 Celiac disease: Secondary | ICD-10-CM

## 2013-07-21 DIAGNOSIS — Z9081 Acquired absence of spleen: Secondary | ICD-10-CM

## 2013-07-21 DIAGNOSIS — F3289 Other specified depressive episodes: Secondary | ICD-10-CM | POA: Diagnosis present

## 2013-07-21 DIAGNOSIS — A498 Other bacterial infections of unspecified site: Secondary | ICD-10-CM | POA: Diagnosis present

## 2013-07-21 DIAGNOSIS — R188 Other ascites: Secondary | ICD-10-CM

## 2013-07-21 DIAGNOSIS — E876 Hypokalemia: Secondary | ICD-10-CM

## 2013-07-21 DIAGNOSIS — F329 Major depressive disorder, single episode, unspecified: Secondary | ICD-10-CM | POA: Diagnosis present

## 2013-07-21 DIAGNOSIS — H3530 Unspecified macular degeneration: Secondary | ICD-10-CM | POA: Diagnosis present

## 2013-07-21 DIAGNOSIS — E559 Vitamin D deficiency, unspecified: Secondary | ICD-10-CM

## 2013-07-21 DIAGNOSIS — N390 Urinary tract infection, site not specified: Secondary | ICD-10-CM

## 2013-07-21 DIAGNOSIS — F319 Bipolar disorder, unspecified: Secondary | ICD-10-CM | POA: Diagnosis present

## 2013-07-21 DIAGNOSIS — R011 Cardiac murmur, unspecified: Secondary | ICD-10-CM | POA: Diagnosis present

## 2013-07-21 DIAGNOSIS — D684 Acquired coagulation factor deficiency: Secondary | ICD-10-CM | POA: Diagnosis present

## 2013-07-21 DIAGNOSIS — E43 Unspecified severe protein-calorie malnutrition: Secondary | ICD-10-CM

## 2013-07-21 DIAGNOSIS — K5289 Other specified noninfective gastroenteritis and colitis: Secondary | ICD-10-CM

## 2013-07-21 DIAGNOSIS — R195 Other fecal abnormalities: Secondary | ICD-10-CM

## 2013-07-21 DIAGNOSIS — D7381 Neutropenic splenomegaly: Secondary | ICD-10-CM

## 2013-07-21 DIAGNOSIS — E1169 Type 2 diabetes mellitus with other specified complication: Secondary | ICD-10-CM | POA: Diagnosis present

## 2013-07-21 DIAGNOSIS — D689 Coagulation defect, unspecified: Secondary | ICD-10-CM

## 2013-07-21 DIAGNOSIS — Z79899 Other long term (current) drug therapy: Secondary | ICD-10-CM

## 2013-07-21 DIAGNOSIS — I10 Essential (primary) hypertension: Secondary | ICD-10-CM | POA: Diagnosis present

## 2013-07-21 DIAGNOSIS — J309 Allergic rhinitis, unspecified: Secondary | ICD-10-CM

## 2013-07-21 DIAGNOSIS — H6980 Other specified disorders of Eustachian tube, unspecified ear: Secondary | ICD-10-CM

## 2013-07-21 DIAGNOSIS — F411 Generalized anxiety disorder: Secondary | ICD-10-CM | POA: Diagnosis present

## 2013-07-21 DIAGNOSIS — F341 Dysthymic disorder: Secondary | ICD-10-CM

## 2013-07-21 DIAGNOSIS — R197 Diarrhea, unspecified: Secondary | ICD-10-CM

## 2013-07-21 DIAGNOSIS — G459 Transient cerebral ischemic attack, unspecified: Principal | ICD-10-CM

## 2013-07-21 DIAGNOSIS — M79673 Pain in unspecified foot: Secondary | ICD-10-CM

## 2013-07-21 DIAGNOSIS — N3010 Interstitial cystitis (chronic) without hematuria: Secondary | ICD-10-CM | POA: Diagnosis present

## 2013-07-21 DIAGNOSIS — K7460 Unspecified cirrhosis of liver: Secondary | ICD-10-CM

## 2013-07-21 DIAGNOSIS — K5090 Crohn's disease, unspecified, without complications: Secondary | ICD-10-CM | POA: Diagnosis present

## 2013-07-21 DIAGNOSIS — R937 Abnormal findings on diagnostic imaging of other parts of musculoskeletal system: Secondary | ICD-10-CM

## 2013-07-21 DIAGNOSIS — K5010 Crohn's disease of large intestine without complications: Secondary | ICD-10-CM | POA: Diagnosis present

## 2013-07-21 DIAGNOSIS — IMO0002 Reserved for concepts with insufficient information to code with codable children: Secondary | ICD-10-CM

## 2013-07-21 DIAGNOSIS — E119 Type 2 diabetes mellitus without complications: Secondary | ICD-10-CM

## 2013-07-21 DIAGNOSIS — R609 Edema, unspecified: Secondary | ICD-10-CM

## 2013-07-21 DIAGNOSIS — E162 Hypoglycemia, unspecified: Secondary | ICD-10-CM | POA: Diagnosis not present

## 2013-07-21 DIAGNOSIS — K254 Chronic or unspecified gastric ulcer with hemorrhage: Secondary | ICD-10-CM

## 2013-07-21 DIAGNOSIS — I350 Nonrheumatic aortic (valve) stenosis: Secondary | ICD-10-CM

## 2013-07-21 LAB — COMPREHENSIVE METABOLIC PANEL
ALBUMIN: 2.6 g/dL — AB (ref 3.5–5.2)
ALT: 31 U/L (ref 0–35)
AST: 63 U/L — ABNORMAL HIGH (ref 0–37)
Alkaline Phosphatase: 130 U/L — ABNORMAL HIGH (ref 39–117)
BUN: 6 mg/dL (ref 6–23)
CO2: 25 mEq/L (ref 19–32)
Calcium: 8.3 mg/dL — ABNORMAL LOW (ref 8.4–10.5)
Chloride: 104 mEq/L (ref 96–112)
Creatinine, Ser: 0.59 mg/dL (ref 0.50–1.10)
GFR calc Af Amer: >90 mL/min (ref >90)
GFR calc non Af Amer: >90 mL/min (ref >90)
GLUCOSE: 234 mg/dL — AB (ref 70–99)
POTASSIUM: 3.8 meq/L (ref 3.7–5.3)
Sodium: 137 mEq/L (ref 137–147)
TOTAL PROTEIN: 8.4 g/dL — AB (ref 6.0–8.3)
Total Bilirubin: 1.6 mg/dL — ABNORMAL HIGH (ref 0.3–1.2)

## 2013-07-21 LAB — CBC WITH DIFFERENTIAL/PLATELET
BASOS ABS: 0.1 10*3/uL (ref 0.0–0.1)
BASOS PCT: 1 % (ref 0–1)
EOS ABS: 0.2 10*3/uL (ref 0.0–0.7)
Eosinophils Relative: 3 % (ref 0–5)
HCT: 35.4 % — ABNORMAL LOW (ref 36.0–46.0)
Hemoglobin: 12.4 g/dL (ref 12.0–15.0)
LYMPHS ABS: 1.9 10*3/uL (ref 0.7–4.0)
Lymphocytes Relative: 27 % (ref 12–46)
MCH: 31.4 pg (ref 26.0–34.0)
MCHC: 35 g/dL (ref 30.0–36.0)
MCV: 89.6 fL (ref 78.0–100.0)
Monocytes Absolute: 0.7 10*3/uL (ref 0.1–1.0)
Monocytes Relative: 10 % (ref 3–12)
NEUTROS PCT: 59 % (ref 43–77)
Neutro Abs: 4 10*3/uL (ref 1.7–7.7)
PLATELETS: 248 10*3/uL (ref 150–400)
RBC: 3.95 MIL/uL (ref 3.87–5.11)
RDW: 15.8 % — ABNORMAL HIGH (ref 11.5–15.5)
WBC: 6.9 10*3/uL (ref 4.0–10.5)

## 2013-07-21 LAB — POCT I-STAT, CHEM 8
BUN: 4 mg/dL — AB (ref 6–23)
CHLORIDE: 105 meq/L (ref 96–112)
Calcium, Ion: 1.21 mmol/L (ref 1.12–1.23)
Creatinine, Ser: 0.7 mg/dL (ref 0.50–1.10)
GLUCOSE: 216 mg/dL — AB (ref 70–99)
HCT: 45 % (ref 36.0–46.0)
Hemoglobin: 15.3 g/dL — ABNORMAL HIGH (ref 12.0–15.0)
Potassium: 3.8 mEq/L (ref 3.7–5.3)
Sodium: 142 mEq/L (ref 137–147)
TCO2: 24 mmol/L (ref 0–100)

## 2013-07-21 LAB — URINALYSIS, ROUTINE W REFLEX MICROSCOPIC
BILIRUBIN URINE: NEGATIVE
GLUCOSE, UA: NEGATIVE mg/dL
Hgb urine dipstick: NEGATIVE
KETONES UR: NEGATIVE mg/dL
Nitrite: NEGATIVE
PROTEIN: NEGATIVE mg/dL
Specific Gravity, Urine: 1.009 (ref 1.005–1.030)
Urobilinogen, UA: 0.2 mg/dL (ref 0.0–1.0)
pH: 6 (ref 5.0–8.0)

## 2013-07-21 LAB — POCT I-STAT TROPONIN I: Troponin i, poc: 0.04 ng/mL (ref 0.00–0.08)

## 2013-07-21 LAB — RAPID URINE DRUG SCREEN, HOSP PERFORMED
AMPHETAMINES: NOT DETECTED
Barbiturates: NOT DETECTED
Benzodiazepines: NOT DETECTED
COCAINE: NOT DETECTED
Opiates: NOT DETECTED
Tetrahydrocannabinol: NOT DETECTED

## 2013-07-21 LAB — URINE MICROSCOPIC-ADD ON

## 2013-07-21 LAB — GLUCOSE, CAPILLARY
GLUCOSE-CAPILLARY: 208 mg/dL — AB (ref 70–99)
Glucose-Capillary: 67 mg/dL — ABNORMAL LOW (ref 70–99)
Glucose-Capillary: 66 mg/dL — ABNORMAL LOW (ref 70–99)
Glucose-Capillary: 78 mg/dL (ref 70–99)

## 2013-07-21 LAB — TROPONIN I: Troponin I: <0.3 ng/mL (ref <0.30)

## 2013-07-21 LAB — PROTIME-INR
INR: 1.98 — ABNORMAL HIGH (ref 0.00–1.49)
Prothrombin Time: 21.9 seconds — ABNORMAL HIGH (ref 11.6–15.2)

## 2013-07-21 LAB — ETHANOL: Alcohol, Ethyl (B): <11 mg/dL (ref 0–11)

## 2013-07-21 LAB — APTT: APTT: 41 s — AB (ref 24–37)

## 2013-07-21 MED ORDER — ACETAMINOPHEN 325 MG PO TABS: 650.0000 mg | ORAL_TABLET | ORAL | Status: DC | PRN

## 2013-07-21 MED ORDER — INSULIN ASPART 100 UNIT/ML ~~LOC~~ SOLN: 0.0000 [IU] | Freq: Three times a day (TID) | SUBCUTANEOUS | Status: DC

## 2013-07-21 MED ORDER — LORAZEPAM 0.5 MG PO TABS
0.5000 mg | ORAL_TABLET | Freq: Two times a day (BID) | ORAL | Status: DC
  Administered 2013-07-21 – 2013-07-23 (×4): 0.5 mg via ORAL
  Filled 2013-07-21 (×4): qty 1

## 2013-07-21 MED ORDER — MESALAMINE 1.2 G PO TBEC
1.2000 g | DELAYED_RELEASE_TABLET | Freq: Two times a day (BID) | ORAL | Status: DC
  Administered 2013-07-21 – 2013-07-23 (×4): 1.2 g via ORAL
  Filled 2013-07-21 (×5): qty 1

## 2013-07-21 MED ORDER — FLUOXETINE HCL 40 MG PO CAPS: 40.0000 mg | ORAL_CAPSULE | Freq: Every morning | ORAL | Status: DC

## 2013-07-21 MED ORDER — DEXTROSE 5 % IV SOLN
1.0000 g | INTRAVENOUS | Status: DC
  Administered 2013-07-21 – 2013-07-22 (×2): 1 g via INTRAVENOUS
  Filled 2013-07-21 (×3): qty 10

## 2013-07-21 MED ORDER — PENTOSAN POLYSULFATE SODIUM 100 MG PO CAPS
200.0000 mg | ORAL_CAPSULE | ORAL | Status: DC
  Administered 2013-07-22 – 2013-07-23 (×3): 200 mg via ORAL
  Filled 2013-07-21 (×5): qty 2

## 2013-07-21 MED ORDER — LORATADINE 10 MG PO TABS
10.0000 mg | ORAL_TABLET | Freq: Every day | ORAL | Status: DC
  Administered 2013-07-22 – 2013-07-23 (×2): 10 mg via ORAL
  Filled 2013-07-21 (×3): qty 1

## 2013-07-21 MED ORDER — FLUOXETINE HCL 20 MG PO CAPS
40.0000 mg | ORAL_CAPSULE | Freq: Every day | ORAL | Status: DC
  Administered 2013-07-22 – 2013-07-23 (×2): 40 mg via ORAL
  Filled 2013-07-21 (×2): qty 2

## 2013-07-21 MED ORDER — ASPIRIN 325 MG PO TABS
325.0000 mg | ORAL_TABLET | Freq: Every day | ORAL | Status: DC
Start: 2013-07-21 — End: 2013-07-23
  Administered 2013-07-21 – 2013-07-23 (×3): 325 mg via ORAL
  Filled 2013-07-21 (×3): qty 1

## 2013-07-21 MED ORDER — ASPIRIN 300 MG RE SUPP
300.0000 mg | Freq: Every day | RECTAL | Status: DC
  Filled 2013-07-21 (×3): qty 1

## 2013-07-21 MED ORDER — TRAZODONE 25 MG HALF TABLET
25.0000 mg | ORAL_TABLET | Freq: Every day | ORAL | Status: DC
  Administered 2013-07-21 – 2013-07-22 (×2): 25 mg via ORAL
  Filled 2013-07-21 (×3): qty 1

## 2013-07-21 MED ORDER — POLYSACCHARIDE IRON COMPLEX 150 MG PO CAPS
150.0000 mg | ORAL_CAPSULE | Freq: Every day | ORAL | Status: DC
  Administered 2013-07-21 – 2013-07-23 (×3): 150 mg via ORAL
  Filled 2013-07-21 (×3): qty 1

## 2013-07-21 MED ORDER — ACETAMINOPHEN 650 MG RE SUPP: 650.0000 mg | RECTAL | Status: DC | PRN

## 2013-07-21 MED ORDER — PANTOPRAZOLE SODIUM 40 MG PO TBEC
40.0000 mg | DELAYED_RELEASE_TABLET | Freq: Every day | ORAL | Status: DC
  Administered 2013-07-22 – 2013-07-23 (×2): 40 mg via ORAL
  Filled 2013-07-21 (×2): qty 1

## 2013-07-21 MED ORDER — FUROSEMIDE 20 MG PO TABS
20.0000 mg | ORAL_TABLET | Freq: Every day | ORAL | Status: DC
  Administered 2013-07-22 – 2013-07-23 (×2): 20 mg via ORAL
  Filled 2013-07-21 (×3): qty 1

## 2013-07-21 MED ORDER — ARIPIPRAZOLE 10 MG PO TABS
10.0000 mg | ORAL_TABLET | Freq: Every day | ORAL | Status: DC
  Administered 2013-07-21 – 2013-07-22 (×2): 10 mg via ORAL
  Filled 2013-07-21 (×3): qty 1

## 2013-07-21 NOTE — Telephone Encounter (Signed)
Call from the patient and she stated she is leaning to the left, she is not having and pain, numbness, no blurred vision, No HA and no CP. She stated it is weird but she can not straighten her body out. I made the patient aware we do not want her to wait until 2:45, she will need to have someone drive her to the ER to r/o TIA or stroke. The patient agreed to do so, someone is home with her and she will have them transport her to the ER.      KP

## 2013-07-21 NOTE — Telephone Encounter (Signed)
Tried calling pt no answer. Looked in chart and pt was seen in ED today.

## 2013-07-21 NOTE — ED Notes (Signed)
Attempted to call report to [facility]. RN unable to take report at this time. Will call back.

## 2013-07-21 NOTE — ED Notes (Signed)
Patient transported to MRI 

## 2013-07-21 NOTE — ED Notes (Addendum)
Report to Alycia Rossetti, RN of [facility].

## 2013-07-21 NOTE — Consult Note (Signed)
Referring Physician: Criss AlvineGoldston    Chief Complaint: drifting to the left upon waking--now resolved  HPI:                                                                                                                                         Sabrina [name] is an 58 y.o. female with liver disease, INR 1.98, bipolar, HTN, DM, hyperlipidemia who went to sleep last night at 10 PM feeling normal.  This AM she awoke at 4 AM to go to the bathroom and noted she was drifting to the left.  She went back to sleep but on waking she felt she was still drifting to left when walking. Husband states he noted her actually drifting into a wall. Patient called PCP who told her to go to ED. At time of consultation she was back to her baseline and had no complaints of weakness, imbalance, decreased sensation, dysarthria,dysphagia, diplopia. MRI brain showed no acute infarct.   Date last known well: Date: 07/20/2013 Time last known well: Time: 22:00 tPA Given: No: out of window  Past Medical History  Diagnosis Date  . Arthritis   . Allergic rhinitis   . Colitis   . Degenerative disc disease   . Hyperlipidemia   . Hypertension   . Celiac disease   . Bipolar disorder   . Macular degeneration   . Interstitial cystitis   . Diabetes mellitus without complication   . Anemia, iron deficiency 10/30/2012  . Heart murmur   . Anxiety   . Depression   . Liver disease   . Gastric ulcer     Past Surgical History  Procedure Laterality Date  . Tubal ligation    . Appendectomy    . Lumbar laminectomy    . Carpal tunnel release      bilateral  . Cesarean section    . Cysto/ hod/ instillatio clorpactin  07-03-2008  &  06-09-2001    INTERSTITIAL CYSTITIS  . Shoulder arthroscopy w/ rotator cuff repair  01-25-2008    LEFT SHOULDER /   INCLUDING CAPSULECTOMY WITH DEBRIDEMENT/  SAD WITH RESECTION OF AC  . Right shoulder arthroscopy/ open resection distal clavicle/ debridement adhesive capsulitis/ rotator cuff repair   08-30-2003  . Tonsillectomy and adenoidectomy    . Transthoracic echocardiogram  03-28-2011    NORMAL LVSF/ EF 60-65%/ VERY MILD AORTIC STENOSIS, TRIVIAL REGURG./ MILDLY DILATED LEFT ATRIUM  . Cystoscopy  11/26/2011    Procedure: CYSTOSCOPY;  Surgeon: Valetta Fulleravid S Grapey, MD;  [facility];  Service: Urology;  Laterality: N/A;  clorpactin  in bladder  . 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 Cholecystectomy    . Spleenectomy      Family History  Problem Relation Age of Onset  .  Diabetes Mother   . Hypertension Mother   . Diabetes Brother   . Hypertension Brother   . Hyperlipidemia Brother   . Hypertension    . Colon cancer Neg Hx    Social History:  reports that she has never smoked. She has never used smokeless tobacco. She reports that she does not drink alcohol or use illicit drugs.  Allergies:  Allergies  Allergen Reactions  . Sulfonamide Derivatives     Classic angioedema reaction  . Wheat Bran     Celiac's disease    Medications:                                                                                                                           No current facility-administered medications for this encounter.   Current Outpatient Prescriptions  Medication Sig Dispense Refill  . acetaminophen (TYLENOL) 500 MG tablet Take 1,000 mg by mouth every 6 (six) hours as needed.      . ARIPiprazole (ABILIFY) 10 MG tablet Take 10 mg by mouth at bedtime.       . fexofenadine (ALLEGRA) 180 MG tablet Take 180 mg by mouth every morning.       Marland Kitchen FLUoxetine (PROZAC) 40 MG capsule Take 40 mg by mouth every morning.       . furosemide (LASIX) 20 MG tablet Take 1 tablet (20 mg total) by mouth daily.  30 tablet  3  . LORazepam (ATIVAN) 0.5 MG tablet Take 0.5 mg by mouth 2 (two) times daily.       . mesalamine (LIALDA) 1.2 G EC  tablet Take 1.2 g by mouth 2 (two) times daily.      Marland Kitchen omeprazole (PRILOSEC) 40 MG capsule Take 1 capsule (40 mg total) by mouth 2 (two) times daily.  60 capsule  6  . pentosan polysulfate (ELMIRON) 100 MG capsule Take 200 mg by mouth 2 (two) times daily.       . Polysaccharide Iron Complex (POLY-IRON 150 PO) Take 1 tablet by mouth 2 (two) times daily.      . sitaGLIPtin (JANUVIA) 100 MG tablet Take 100 mg by mouth daily.      . traZODone (DESYREL) 50 MG tablet Take 25 mg by mouth at bedtime.           ROS:  History obtained from the patient  General ROS: negative for - chills, fatigue, fever, night sweats, weight gain or weight loss Psychological ROS: negative for - behavioral disorder, hallucinations, memory difficulties, mood swings or suicidal ideation Ophthalmic ROS: negative for - blurry vision, double vision, eye pain or loss of vision ENT ROS: negative for - epistaxis, nasal discharge, oral lesions, sore throat, tinnitus or vertigo Allergy and Immunology ROS: negative for - hives or itchy/watery eyes Hematological and Lymphatic ROS: negative for - bleeding problems, bruising or swollen lymph nodes Endocrine ROS: negative for - galactorrhea, hair pattern changes, polydipsia/polyuria or temperature intolerance Respiratory ROS: negative for - cough, hemoptysis, shortness of breath or wheezing Cardiovascular ROS: negative for - chest pain, dyspnea on exertion, edema or irregular heartbeat Gastrointestinal ROS: negative for - abdominal pain, diarrhea, hematemesis, nausea/vomiting or stool incontinence Genito-Urinary ROS: negative for - dysuria, hematuria, incontinence or urinary frequency/urgency Musculoskeletal ROS: negative for - joint swelling or muscular weakness Neurological ROS: as noted in HPI Dermatological ROS: negative for rash and skin lesion  changes  Neurologic Examination:                                                                                                      Blood pressure 125/68, pulse 64, temperature 98 F (36.7 C), temperature source Oral, resp. rate 16, weight 66.679 kg (147 lb), SpO2 99.00%.   Mental Status: Alert, oriented, thought content appropriate.  Speech fluent without evidence of aphasia.  Able to follow 3 step commands without difficulty. Cranial Nerves: II: Discs flat bilaterally; Visual fields grossly normal, pupils equal, round, reactive to light and accommodation III,IV, VI: ptosis not present, extra-ocular motions intact bilaterally V,VII: smile symmetric, facial light touch sensation normal bilaterally VIII: hearing normal bilaterally IX,X: gag reflex present XI: bilateral shoulder shrug XII: midline tongue extension without atrophy or fasciculations  Motor: Right : Upper extremity   5/5    Left:     Upper extremity   5/5  Lower extremity   5/5     Lower extremity   5/5 Tone and bulk:normal tone throughout; no atrophy noted Sensory: Pinprick, vibration, proprioception and light touch intact throughout, bilaterally. Positive stocking distribution to temperature in LE Deep Tendon Reflexes:  Right: Upper Extremity   Left: Upper extremity   biceps (C-5 to C-6) 2/4   biceps (C-5 to C-6) 2/4 tricep (C7) 2/4    triceps (C7) 2/4 Brachioradialis (C6) 2/4  Brachioradialis (C6) 2/4  Lower Extremity Lower Extremity  quadriceps (L-2 to L-4) 2/4   quadriceps (L-2 to L-4) 2/4 Achilles (S1) 2/4   Achilles (S1) 2/4  Plantars: Right: downgoing   Left: downgoing Cerebellar: normal finger-to-nose,  normal heel-to-shin test Gait: narrow based, negative romberg, no swaying or drifting noted.  Able to walk tandem CV: pulses palpable throughout    Lab Results: Basic Metabolic Panel:  Recent Labs Lab 07/21/13 1055 07/21/13 1104  NA 137 142  K 3.8 3.8  CL 104 105  CO2 25  --   GLUCOSE 234*  216*  BUN 6 4*  CREATININE 0.59 0.70  CALCIUM 8.3*  --     Liver Function Tests:  Recent Labs Lab 07/21/13 1055  AST 63*  ALT 31  ALKPHOS 130*  BILITOT 1.6*  PROT 8.4*  ALBUMIN 2.6*   No results found for this basename: LIPASE, AMYLASE,  in the last 168 hours No results found for this basename: AMMONIA,  in the last 168 hours  CBC:  Recent Labs Lab 07/21/13 1055 07/21/13 1104  WBC 6.9  --   NEUTROABS 4.0  --   HGB 12.4 15.3*  HCT 35.4* 45.0  MCV 89.6  --   PLT 248  --     Cardiac Enzymes:  Recent Labs Lab 07/21/13 1055  TROPONINI <0.30    Lipid Panel: No results found for this basename: CHOL, TRIG, HDL, CHOLHDL, VLDL, LDLCALC,  in the last 168 hours  CBG:  Recent Labs Lab 07/21/13 1015  GLUCAP 208*    Microbiology: Results for orders placed in visit on 03/31/13  CLOSTRIDIUM DIFFICILE BY PCR     Status: None   Collection Time    03/31/13  8:53 AM      Result Value Range Status   C difficile by pcr Not Detected  Not Detected Final   Comment:       This assay detects the presence of Clostridium difficile DNA coding     for toxin B (tcdB) by real-time polymerase chain reaction (PCR)     amplification.     This test was developed and its performance characteristics have been     determined by [employer]. Performance characteristics refer     to the analytical performance of the test. This test has not been     cleared or approved by the [place] Food and Drug Administration. The FDA     has determined that such clearance or approval is not necessary. This     laboratory is certified under the Clinical Laboratory Improvement     Amendments of 1988 as qualified to perform high complexity clinical     laboratory testing.  STOOL CULTURE     Status: None   Collection Time    03/31/13  8:53 AM      Result Value Range Status   Organism ID, Bacteria No Salmonella,Shigella,Campylobacter,Yersinia,or   Final   Organism ID, Bacteria No E.coli [id number]:H7  isolated.   Final    Coagulation Studies:  Recent Labs  07/21/13 1055  LABPROT 21.9*  INR 1.98*    Imaging: Ct Head Wo Contrast  07/21/2013   CLINICAL DATA:  Left-sided weakness  EXAM: CT HEAD WITHOUT CONTRAST  TECHNIQUE: Contiguous axial images were obtained from the base of the skull through the vertex without intravenous contrast.  COMPARISON:  None.  FINDINGS: Ventricle size is normal. Negative for acute infarct. Negative for hemorrhage or mass. Calvarium intact.  IMPRESSION: Negative   Electronically Signed   By: Marlan Palau M.D.   On: 07/21/2013 11:18   Mr Brain Wo Contrast  07/21/2013   CLINICAL DATA:  Acute onset of left-sided weakness. Stroke versus TIA.  EXAM: MRI HEAD WITHOUT CONTRAST  TECHNIQUE: Multiplanar, multiecho pulse sequences of the brain and surrounding structures were obtained without intravenous contrast.  COMPARISON:  Head CT 07/21/2013 and brain MRI 03/03/2009  FINDINGS: There is abnormal T1 hyperintensity involving the globi pallidi bilaterally extending into the cerebral peduncles. There is mild cerebral atrophy. There is no evidence of acute infarct, intracranial hemorrhage, mass, midline shift, or extra-axial fluid collection. Orbits are normal. Major intracranial  vascular flow voids are unremarkable. Visualized paranasal sinuses and mastoid air cells are clear.  IMPRESSION: 1. No evidence of acute infarct. 2. T1 hyperintensity in the globi pallidi. This is most commonly seen in the setting of liver dysfunction.   Electronically Signed   By: Sebastian Ache   On: 07/21/2013 13:42    Assessment and plan discussed with with attending physician and they are in agreement.    Felicie Morn PA-C [facility] [phone]  07/21/2013, 1:56 PM   Assessment: 58 y.o. female with transient leftward lean and left leg weakness that has completely resolved.  At this time her symptoms have cleared and neuro exam is non-focal.  Given risk factors of DM, HTN,  hyperlipidemia, I do feel that TIA is likely. She is at increased risk with her INR from liver disease, but especially with her multiple risk factors, a daily baby aspirin may be needed.    Stroke Risk Factors - diabetes mellitus, hyperlipidemia and hypertension   Recommend:  1. HgbA1c, fasting lipid panel 2. MRA  of the brain without contrast 3. PT consult, OT consult, Speech consult 4. Echocardiogram 5. Carotid dopplers 6. Prophylactic therapy-Antiplatelet med: Aspirin - dose 81 mg daily with food 7. Risk  Factor modification   Ritta Slot, MD [facility] [phone]  If 7pm- 7am, please page neurology on call at [phone].

## 2013-07-21 NOTE — Telephone Encounter (Signed)
Agree w/ advice given 

## 2013-07-21 NOTE — ED Notes (Signed)
Patient transported to CT 

## 2013-07-21 NOTE — Progress Notes (Signed)
Pt CBG 78, diet order received from L. Harduk. Pt eating and asymptomatic. Will continue to monitor.   Ulice Dash, Charity fundraiser

## 2013-07-21 NOTE — H&P (Addendum)
[facility] History and Physical  Sabrina [name] FKC:[id number] DOB: 24-Apr-1956 DOA: 07/21/2013   PCP: Annye Asa, MD   Chief Complaint: Left leg weakness  HPI:  58 year old female with a history of Crohn's disease, diabetes mellitus, bipolar disorder, liver cirrhosis with coagulopathy presents with left leg weakness that began when she woke up on the morning of admission around 4 AM. The patient felt like she was leaning to her left side and felt that her left leg was weak. She went back to bed. She woke up again around 6 AM without any improvement in her left leg weakness. She continued to lean to the left. She wants to take a shower and lost her balance and suffered a mechanical fall. The patient denies any syncope. She denies any fevers, chills, chest discomfort, dizziness, shortness of breath, blurry vision, dysarthria, upper extremity weakness, diplopia. Her deficits have returned back to baseline immediately. The patient was seen by neurology in [facility] ED who recommened transfer to [facility] for further care.  In the ED, CBC was unremarkable. BMP was unremarkable. Hepatic enzymes showed mild elevation of AST 63, ALT 31, total bilirubin 1.6, alkaline phosphatase 130. CT of the brain was negative. MRI brain was negative for acute infarct but showed T1 hyperintensity of the oldest pelvis consistent with liver dysfunction. Urinalysis showed too numerous to count WBCs. Urine drug screen was negative. 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 Henrene Pastor (GI) -No active bleeding presently -Hemoglobin is stable Diabetes mellitus type 2, controlled -Hold Januvia -NovoLog sliding scale -05/05/2013 hemoglobin A1c 5.4 Crohn's disease -Continue mesalamine Bipolar disorder -Continue Prozac, Abilify, trazodone       Past Medical History  Diagnosis Date  . Arthritis   . Allergic rhinitis   . Colitis   . Degenerative disc disease   . Hyperlipidemia   . Hypertension   . Celiac disease   . Bipolar disorder   . Macular degeneration   . Interstitial cystitis   . Diabetes mellitus without complication   . Anemia, iron deficiency 10/30/2012  . Heart murmur   . Anxiety   . Depression   . Liver disease   . Gastric ulcer    Past Surgical History  Procedure Laterality Date  . Tubal ligation    . Appendectomy    . Lumbar laminectomy    . Carpal tunnel release      bilateral  . Cesarean section    . Cysto/ hod/ instillatio clorpactin  07-03-2008  &  06-09-2001    INTERSTITIAL CYSTITIS  . Shoulder arthroscopy w/ rotator cuff repair  01-25-2008    LEFT SHOULDER /   INCLUDING CAPSULECTOMY WITH DEBRIDEMENT/  SAD WITH RESECTION OF AC  . Right shoulder arthroscopy/ open resection distal clavicle/ debridement adhesive capsulitis/ rotator cuff repair  08-30-2003  . Tonsillectomy and adenoidectomy    . Transthoracic echocardiogram  03-28-2011    NORMAL LVSF/ EF 60-65%/ VERY MILD AORTIC STENOSIS, TRIVIAL REGURG./ MILDLY DILATED LEFT ATRIUM  . Cystoscopy  11/26/2011    Procedure: CYSTOSCOPY;  Surgeon: Bernestine Amass,  MD;  Location: [place];  Service: Urology;  Laterality: N/A;  clorpactin  in bladder  . Splenectomy, total N/A 02/23/2013    Procedure: OPEN SPLENECTOMY AND CHOLECYSTECTOMY ;  Surgeon: Adin Hector, MD;  Location: [facility];  Service: General;   Laterality: N/A;  . Cholecystectomy    . Spleenectomy     Social History:  reports that she has never smoked. She has never used smokeless tobacco. She reports that she does not drink alcohol or use illicit drugs.   Family History  Problem Relation Age of Onset  . Diabetes Mother   . Hypertension Mother   . Diabetes Brother   . Hypertension Brother   . Hyperlipidemia Brother   . Hypertension    . Colon cancer Neg Hx      Allergies  Allergen Reactions  . Sulfonamide Derivatives     Classic angioedema reaction  . Wheat Bran     Celiac's disease      Prior to Admission medications   Medication Sig Start Date End Date Taking? Authorizing Provider  acetaminophen (TYLENOL) 500 MG tablet Take 1,000 mg by mouth every 6 (six) hours as needed.   Yes Historical Provider, MD  ARIPiprazole (ABILIFY) 10 MG tablet Take 10 mg by mouth at bedtime.    Yes Historical Provider, MD  fexofenadine (ALLEGRA) 180 MG tablet Take 180 mg by mouth every morning.    Yes Historical Provider, MD  FLUoxetine (PROZAC) 40 MG capsule Take 40 mg by mouth every morning.    Yes Historical Provider, MD  furosemide (LASIX) 20 MG tablet Take 1 tablet (20 mg total) by mouth daily. 04/28/13  Yes Irene Shipper, MD  LORazepam (ATIVAN) 0.5 MG tablet Take 0.5 mg by mouth 2 (two) times daily.    Yes Historical Provider, MD  mesalamine (LIALDA) 1.2 G EC tablet Take 1.2 g by mouth 2 (two) times daily.   Yes Historical Provider, MD  omeprazole (PRILOSEC) 40 MG capsule Take 1 capsule (40 mg total) by mouth 2 (two) times daily. 12/02/12  Yes Irene Shipper, MD  pentosan polysulfate (ELMIRON) 100 MG capsule Take 200 mg by mouth 2 (two) times daily.    Yes Historical Provider, MD  Polysaccharide Iron Complex (POLY-IRON 150 PO) Take 1 tablet by mouth 2 (two) times daily.   Yes Historical Provider, MD  sitaGLIPtin (JANUVIA) 100 MG tablet Take 100 mg by mouth daily.   Yes Historical Provider, MD  traZODone (DESYREL) 50 MG tablet Take 25  mg by mouth at bedtime.     Yes Historical Provider, MD    Review of Systems:  Constitutional:  No weight loss, night sweats, Fevers, chills, fatigue.  Head&Eyes: No headache.  No vision loss.  No eye pain or scotoma ENT:  No Difficulty swallowing,Tooth/dental problems,Sore throat,  No ear ache, post nasal drip,  Cardio-vascular:  No chest pain, Orthopnea, PND, swelling in lower extremities,  dizziness, palpitations  GI:  No  abdominal pain, nausea, vomiting, diarrhea, loss of appetite, hematochezia, melena, heartburn, indigestion, Resp:  No shortness of breath with exertion or at rest. No cough. No coughing up of blood .No wheezing.No chest wall deformity  Skin:  no rash or lesions.  GU:  no dysuria, change in color of urine, no urgency or frequency. No flank pain.  Musculoskeletal:  No joint pain or swelling. No decreased range of motion. No back pain.  Psych:  No change in mood or affect. No depression or anxiety. Neurologic: No headache,  no dysesthesia, no focal weakness, no vision loss. No syncope  Physical Exam: Filed Vitals:   07/21/13 1054 07/21/13 1140 07/21/13 1325 07/21/13 1438  BP: 122/65 122/65 125/68 121/67  Pulse: 62 66 64 66  Temp: 98 F (36.7 C) 97.9 F (36.6 C) 98 F (36.7 C)   TempSrc: Oral Oral Oral   Resp: $Remo'20  16 19  'usHzt$ Weight:      SpO2: 99% 99% 99% 98%   General:  A&O x 3, NAD, nontoxic, pleasant/cooperative Head/Eye: No conjunctival hemorrhage, no icterus, [place]/AT, No nystagmus ENT:  No icterus,  No thrush, good dentition, no pharyngeal exudate Neck:  No masses, no lymphadenpathy, no bruits CV:  RRR, no rub, no gallop, no S3 Lung:  CTAB, good air movement, no wheeze, no rhonchi Abdomen: soft/NT, +BS, nondistended, no peritoneal signs Ext: No cyanosis, No rashes, No petechiae, No lymphangitis, 2+ edema Neuro: CNII-XII intact, strength 4/5 in bilateral upper and lower extremities, no dysmetria  Labs on Admission:  Basic Metabolic Panel:  Recent  Labs Lab 07/21/13 1055 07/21/13 1104  NA 137 142  K 3.8 3.8  CL 104 105  CO2 25  --   GLUCOSE 234* 216*  BUN 6 4*  CREATININE 0.59 0.70  CALCIUM 8.3*  --    Liver Function Tests:  Recent Labs Lab 07/21/13 1055  AST 63*  ALT 31  ALKPHOS 130*  BILITOT 1.6*  PROT 8.4*  ALBUMIN 2.6*   No results found for this basename: LIPASE, AMYLASE,  in the last 168 hours No results found for this basename: AMMONIA,  in the last 168 hours CBC:  Recent Labs Lab 07/21/13 1055 07/21/13 1104  WBC 6.9  --   NEUTROABS 4.0  --   HGB 12.4 15.3*  HCT 35.4* 45.0  MCV 89.6  --   PLT 248  --    Cardiac Enzymes:  Recent Labs Lab 07/21/13 1055  TROPONINI <0.30   BNP: No components found with this basename: POCBNP,  CBG:  Recent Labs Lab 07/21/13 1015  GLUCAP 208*    Radiological Exams on Admission: Ct Head Wo Contrast  07/21/2013   CLINICAL DATA:  Left-sided weakness  EXAM: CT HEAD WITHOUT CONTRAST  TECHNIQUE: Contiguous axial images were obtained from the base of the skull through the vertex without intravenous contrast.  COMPARISON:  None.  FINDINGS: Ventricle size is normal. Negative for acute infarct. Negative for hemorrhage or mass. Calvarium intact.  IMPRESSION: Negative   Electronically Signed   By: Franchot Gallo M.D.   On: 07/21/2013 11:18   Mr Brain Wo Contrast  07/21/2013   CLINICAL DATA:  Acute onset of left-sided weakness. Stroke versus TIA.  EXAM: MRI HEAD WITHOUT CONTRAST  TECHNIQUE: Multiplanar, multiecho pulse sequences of the brain and surrounding structures were obtained without intravenous contrast.  COMPARISON:  Head CT 07/21/2013 and brain MRI 03/03/2009  FINDINGS: There is abnormal T1 hyperintensity involving the globi pallidi bilaterally extending into the cerebral peduncles. There is mild cerebral atrophy. There is no evidence of acute infarct, intracranial hemorrhage, mass, midline shift, or extra-axial fluid collection. Orbits are normal. Major intracranial  vascular flow voids are unremarkable. Visualized paranasal sinuses and mastoid air cells are clear.  IMPRESSION: 1. No evidence of acute infarct. 2. T1 hyperintensity in the globi pallidi. This is most commonly seen in the setting of liver dysfunction.   Electronically Signed   By: Logan Bores   On: 07/21/2013 13:42    EKG: Independently reviewed. Sinus rhythm, no ST-T wave  changes    Time spent:60 minutes Code Status:   FULL Family Communication:   No Family at bedside   Kathyann Spaugh, DO  [facility] Pager [phone]  If 7PM-7AM, please contact night-coverage [email or web address] Password [facility] 07/21/2013, 3:18 PM

## 2013-07-21 NOTE — ED Notes (Signed)
MD at bedside. Dr. Amada Jupiter, neurologist, at bedside speaking with pt.

## 2013-07-21 NOTE — Progress Notes (Signed)
Hypoglycemic Event  CBG: 67  Treatment: 15 GM carbohydrate snack  Symptoms: Hungry and None  Follow-up CBG: Time:2145 CBG Result:66  Possible Reasons for Event: Inadequate meal intake  Comments/MD notified: L. Harduk notified, will continue to treat and monitor pt.     Sabrina [name], [place] C  Remember to initiate Hypoglycemia Order Set & complete

## 2013-07-21 NOTE — ED Provider Notes (Signed)
CSN: [id number]     Arrival date & time 07/21/13  1003 History   First MD Initiated Contact with Patient 07/21/13 1023     Chief Complaint  Patient presents with  . Weakness   (Consider location/radiation/quality/duration/timing/severity/associated sxs/prior Treatment) HPI Comments: 58 year old female presents with 6-1/2 hours of left-sided weakness. She states she woke up this way at 4 AM. She states which went to the bathroom at that time she noticed that she was having trouble walking on her left side it seemed to lean to the left. She did not fall. She went back to bed and then 2 hours later went to get a shower and acutely fell because her left leg seemed to give out. Patient states she's not injure herself in the shower. Did not hit her head. Patient states that she then waited until 8 AM and called her PCPs advice to come to the ER. Patient states she went to bed at 10 PM last night and was normal (over 12 hours ago). Denies headaches, neck pain, back pain, chest pain or shortness of breath over numbness. She states that it feels like her left leg was weak but not her left arm. There's been no facial droop with slurred speech per the husband. She has not been sick recently.   Past Medical History  Diagnosis Date  . Arthritis   . Allergic rhinitis   . Colitis   . Degenerative disc disease   . Hyperlipidemia   . Hypertension   . Celiac disease   . Bipolar disorder   . Macular degeneration   . Interstitial cystitis   . Diabetes mellitus without complication   . Anemia, iron deficiency 10/30/2012  . Heart murmur   . Anxiety   . Depression   . Liver disease   . Gastric ulcer    Past Surgical History  Procedure Laterality Date  . Tubal ligation    . Appendectomy    . Lumbar laminectomy    . Carpal tunnel release      bilateral  . Cesarean section    . Cysto/ hod/ instillatio clorpactin  07-03-2008  &  06-09-2001    INTERSTITIAL CYSTITIS  . Shoulder arthroscopy w/ rotator cuff  repair  01-25-2008    LEFT SHOULDER /   INCLUDING CAPSULECTOMY WITH DEBRIDEMENT/  SAD WITH RESECTION OF AC  . Right shoulder arthroscopy/ open resection distal clavicle/ debridement adhesive capsulitis/ rotator cuff repair  08-30-2003  . Tonsillectomy and adenoidectomy    . Transthoracic echocardiogram  03-28-2011    NORMAL LVSF/ EF 60-65%/ VERY MILD AORTIC STENOSIS, TRIVIAL REGURG./ MILDLY DILATED LEFT ATRIUM  . Cystoscopy  11/26/2011    Procedure: CYSTOSCOPY;  Surgeon: Valetta Fuller, MD;  Location: [facility];  Service: Urology;  Laterality: N/A;  clorpactin  in bladder  . 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 Cholecystectomy    . Spleenectomy     Family History  Problem Relation Age of Onset  . Diabetes Mother   . Hypertension Mother   . Diabetes Brother   . Hypertension Brother   . Hyperlipidemia Brother   . Hypertension    . Colon cancer Neg Hx    History  Substance Use Topics  . Smoking status: Never Smoker   . Smokeless tobacco: Never Used  . Alcohol Use: No   OB History  Grav Para Term Preterm Abortions TAB SAB Ect Mult Living                 Review of Systems  Constitutional: Negative for fever.  Respiratory: Negative for cough and shortness of breath.   Cardiovascular: Negative for chest pain.  Gastrointestinal: Negative for nausea, vomiting and abdominal pain.  Genitourinary: Negative for dysuria.  Musculoskeletal: Negative for back pain and neck pain.  Neurological: Positive for weakness. Negative for speech difficulty, numbness and headaches.  All other systems reviewed and are negative.    Allergies  Sulfonamide derivatives and Wheat bran  Home Medications   Current Outpatient Rx  Name  Route  Sig  Dispense  Refill  . ARIPiprazole (ABILIFY) 10 MG tablet   Oral   Take 10 mg by mouth at bedtime.           . fexofenadine (ALLEGRA) 180 MG tablet   Oral   Take 180 mg by mouth every morning.          Marland Kitchen. FLUoxetine (PROZAC) 40 MG capsule   Oral   Take 40 mg by mouth every morning.          . furosemide (LASIX) 20 MG tablet   Oral   Take 1 tablet (20 mg total) by mouth daily.   30 tablet   3   . JANUVIA 100 MG tablet      TAKE ONE TABLET BY MOUTH ONE TIME DAILY    30 tablet   2   . Lancets (ONETOUCH ULTRASOFT) lancets      Use as instructed to test glucose levels once daily.   100 each   12     Dx, 250.00   . LIALDA 1.2 G EC tablet      TAKE ONE TABLET BY MOUTH TWICE DAILY    180 tablet   0   . LORazepam (ATIVAN) 0.5 MG tablet   Oral   Take 0.5 mg by mouth 2 (two) times daily.          . Multiple Vitamin (MULTIVITAMIN WITH MINERALS) TABS   Oral   Take 1 tablet by mouth every morning.         Marland Kitchen. omeprazole (PRILOSEC) 40 MG capsule   Oral   Take 1 capsule (40 mg total) by mouth 2 (two) times daily.   60 capsule   6   . pentosan polysulfate (ELMIRON) 100 MG capsule   Oral   Take 200 mg by mouth 2 (two) times daily.          Marland Kitchen. POLY-IRON 150 150 MG capsule      TAKE ONE CAPSULE BY MOUTH TWICE DAILY    60 capsule   2   . traZODone (DESYREL) 50 MG tablet   Oral   Take 25 mg by mouth at bedtime.            BP 120/56  Pulse 67  Temp(Src) 98 F (36.7 C)  Resp 20  Wt 147 lb (66.679 kg)  SpO2 100% Physical Exam  Nursing note and vitals reviewed. Constitutional: She is oriented to person, place, and time. She appears well-developed and well-nourished.  HENT:  Head: Normocephalic and atraumatic.  Right Ear: External ear normal.  Left Ear: External ear normal.  Nose: Nose normal.  Eyes: EOM are normal. Pupils are equal, round, and reactive to light. Right eye exhibits no discharge. Left eye exhibits no discharge.  Cardiovascular: Normal rate and regular rhythm.   Murmur heard.  Pulmonary/Chest: Effort normal and breath sounds normal.   Abdominal: Soft. There is no tenderness.  Neurological: She is alert and oriented to person, place, and time. No sensory deficit.  CN 2-12 grossly intact. 5/5 strength in RUE, LUE, RLE. Grip strength normal. Subtle possible weakness in LLE, though she can hold her leg up vs resistance but seems subjectively a little weaker than right.  Skin: Skin is warm and dry.    ED Course  Procedures (including critical care time) Labs Review Labs Reviewed  CBC WITH DIFFERENTIAL - Abnormal; Notable for the following:    HCT 35.4 (*)    RDW 15.8 (*)    All other components within normal limits  COMPREHENSIVE METABOLIC PANEL - Abnormal; Notable for the following:    Glucose, Bld 234 (*)    Calcium 8.3 (*)    Total Protein 8.4 (*)    Albumin 2.6 (*)    AST 63 (*)    Alkaline Phosphatase 130 (*)    Total Bilirubin 1.6 (*)    All other components within normal limits  GLUCOSE, CAPILLARY - Abnormal; Notable for the following:    Glucose-Capillary 208 (*)    All other components within normal limits  PROTIME-INR - Abnormal; Notable for the following:    Prothrombin Time 21.9 (*)    INR 1.98 (*)    All other components within normal limits  APTT - Abnormal; Notable for the following:    aPTT 41 (*)    All other components within normal limits  URINALYSIS, ROUTINE W REFLEX MICROSCOPIC - Abnormal; Notable for the following:    APPearance CLOUDY (*)    Leukocytes, UA LARGE (*)    All other components within normal limits  URINE MICROSCOPIC-ADD ON - Abnormal; Notable for the following:    Casts HYALINE CASTS (*)    All other components within normal limits  POCT I-STAT, CHEM 8 - Abnormal; Notable for the following:    BUN 4 (*)    Glucose, Bld 216 (*)    Hemoglobin 15.3 (*)    All other components within normal limits  URINE CULTURE  ETHANOL  URINE RAPID DRUG SCREEN (HOSP PERFORMED)  TROPONIN I  POCT I-STAT TROPONIN I   Imaging Review Ct Head Wo Contrast  07/21/2013   CLINICAL DATA:   Left-sided weakness  EXAM: CT HEAD WITHOUT CONTRAST  TECHNIQUE: Contiguous axial images were obtained from the base of the skull through the vertex without intravenous contrast.  COMPARISON:  None.  FINDINGS: Ventricle size is normal. Negative for acute infarct. Negative for hemorrhage or mass. Calvarium intact.  IMPRESSION: Negative   Electronically Signed   By: Marlan Palau M.D.   On: 07/21/2013 11:18   Mr Brain Wo Contrast  07/21/2013   CLINICAL DATA:  Acute onset of left-sided weakness. Stroke versus TIA.  EXAM: MRI HEAD WITHOUT CONTRAST  TECHNIQUE: Multiplanar, multiecho pulse sequences of the brain and surrounding structures were obtained without intravenous contrast.  COMPARISON:  Head CT 07/21/2013 and brain MRI 03/03/2009  FINDINGS: There is abnormal T1 hyperintensity involving the globi pallidi bilaterally extending into the cerebral peduncles. There is mild cerebral atrophy. There is no evidence of acute infarct, intracranial hemorrhage, mass, midline shift, or extra-axial fluid collection. Orbits are normal. Major intracranial vascular flow voids are unremarkable. Visualized paranasal sinuses and mastoid air cells are clear.  IMPRESSION: 1. No evidence of acute infarct. 2. T1 hyperintensity in the globi pallidi. This is most commonly seen in the setting of liver dysfunction.  Electronically Signed   By: Sebastian Ache   On: 07/21/2013 13:42    EKG Interpretation    Date/Time:  Thursday July 21 2013 10:39:35 EST Ventricular Rate:  68 PR Interval:  162 QRS Duration: 93 QT Interval:  442 QTC Calculation: 470 R Axis:   55 Text Interpretation:  Sinus rhythm Consider left ventricular hypertrophy Baseline wander in lead(s) I No significant change since last tracing Confirmed by Coryn Mosso  MD, Haeleigh Streiff ([id number]) on 07/21/2013 2:54:36 PM            MDM   1. TIA (transient ischemic attack)    Patient's symptoms concerning for mild stroke sx vs TIAs. Last normal 12.5 hours PTA, not a  candidate for a code stroke. Patient's symptoms seemed to resolve while in ED. Negative MRI (requested by Neuro). Neuro evaluated and feels she needs the stroke/TIA workup in the hospital, would like patient to be admitted to [facility]. [facility] to admit, [facility] transfer [facility] to [facility].    Audree Camel, MD 07/21/13 1455

## 2013-07-21 NOTE — ED Notes (Signed)
Pt a/o x 4. Pt has clear speech. No facial droop. No extremity weakness. Pt states she woke up at 4AM this morning and was "leaning to Left". Pt denied any extremity weakness. Pt states she fell in the shower this morning.

## 2013-07-21 NOTE — ED Notes (Signed)
Pt states went to bed normal last night; states when she woke up at 4am she noticed her balance was off; states leaning to the side when walking; fell in the shoulder when she tried to lift one leg; states speech is fine although sounds somewhat slurred; feels "wobbly"; states vision fine; decreased grip and leg strength on right--minimally; equal facial grimace and eyebrow/cheek movement; no arm drift; left eye drooping; cbg 208 in triage; denies pain

## 2013-07-21 NOTE — ED Notes (Signed)
Pt is still in MRI.

## 2013-07-22 DIAGNOSIS — I10 Essential (primary) hypertension: Secondary | ICD-10-CM

## 2013-07-22 DIAGNOSIS — K7460 Unspecified cirrhosis of liver: Secondary | ICD-10-CM

## 2013-07-22 DIAGNOSIS — R011 Cardiac murmur, unspecified: Secondary | ICD-10-CM | POA: Diagnosis present

## 2013-07-22 DIAGNOSIS — I359 Nonrheumatic aortic valve disorder, unspecified: Secondary | ICD-10-CM

## 2013-07-22 DIAGNOSIS — N390 Urinary tract infection, site not specified: Secondary | ICD-10-CM

## 2013-07-22 DIAGNOSIS — E162 Hypoglycemia, unspecified: Secondary | ICD-10-CM | POA: Diagnosis not present

## 2013-07-22 DIAGNOSIS — I6350 Cerebral infarction due to unspecified occlusion or stenosis of unspecified cerebral artery: Secondary | ICD-10-CM

## 2013-07-22 LAB — LIPID PANEL
Cholesterol: 142 mg/dL (ref 0–200)
HDL: 20 mg/dL — AB (ref >39)
LDL Cholesterol: 109 mg/dL — ABNORMAL HIGH (ref 0–99)
Total CHOL/HDL Ratio: 7.1 RATIO
Triglycerides: 67 mg/dL (ref <150)
VLDL: 13 mg/dL (ref 0–40)

## 2013-07-22 LAB — BASIC METABOLIC PANEL
BUN: 7 mg/dL (ref 6–23)
CO2: 24 meq/L (ref 19–32)
Calcium: 8 mg/dL — ABNORMAL LOW (ref 8.4–10.5)
Chloride: 110 mEq/L (ref 96–112)
Creatinine, Ser: 0.51 mg/dL (ref 0.50–1.10)
GFR calc Af Amer: >90 mL/min (ref >90)
GLUCOSE: 107 mg/dL — AB (ref 70–99)
POTASSIUM: 3.5 meq/L — AB (ref 3.7–5.3)
SODIUM: 143 meq/L (ref 137–147)

## 2013-07-22 LAB — PROTIME-INR
INR: 2.18 — ABNORMAL HIGH (ref 0.00–1.49)
Prothrombin Time: 23.6 seconds — ABNORMAL HIGH (ref 11.6–15.2)

## 2013-07-22 LAB — CBC
HCT: 30.8 % — ABNORMAL LOW (ref 36.0–46.0)
HEMOGLOBIN: 10.8 g/dL — AB (ref 12.0–15.0)
MCH: 31.2 pg (ref 26.0–34.0)
MCHC: 35.1 g/dL (ref 30.0–36.0)
MCV: 89 fL (ref 78.0–100.0)
Platelets: 236 10*3/uL (ref 150–400)
RBC: 3.46 MIL/uL — ABNORMAL LOW (ref 3.87–5.11)
RDW: 16.2 % — ABNORMAL HIGH (ref 11.5–15.5)
WBC: 7.1 10*3/uL (ref 4.0–10.5)

## 2013-07-22 LAB — TSH: TSH: 2.566 u[IU]/mL (ref 0.350–4.500)

## 2013-07-22 LAB — GLUCOSE, CAPILLARY
Glucose-Capillary: 83 mg/dL (ref 70–99)
Glucose-Capillary: 83 mg/dL (ref 70–99)
Glucose-Capillary: 76 mg/dL (ref 70–99)
Glucose-Capillary: 103 mg/dL — ABNORMAL HIGH (ref 70–99)

## 2013-07-22 LAB — HEMOGLOBIN A1C
Hgb A1c MFr Bld: 5.5 % (ref <5.7)
MEAN PLASMA GLUCOSE: 111 mg/dL (ref <117)

## 2013-07-22 MED ORDER — POTASSIUM CHLORIDE CRYS ER 20 MEQ PO TBCR
40.0000 meq | EXTENDED_RELEASE_TABLET | Freq: Once | ORAL | Status: AC
  Administered 2013-07-22: 40 meq via ORAL
  Filled 2013-07-22: qty 2

## 2013-07-22 NOTE — Evaluation (Signed)
Physical Therapy Evaluation Patient Details Name: Sabrina [name] MRN: [id number] DOB: October 24, 1955 Today's Date: 07/22/2013 Time: 8101-7510 PT Time Calculation (min): 13 min  PT Assessment / Plan / Recommendation History of Present Illness  58 y.o. female admitted to [facility] on 07/21/13 with history of Crohn's disease, diabetes mellitus, bipolar disorder, liver cirrhosis with coagulopathy presents with left leg weakness that began when she woke up on the morning of admission around 4 AM. The patient felt like she was leaning to her left side and felt that her left leg was weak. She went back to bed. She woke up again around 6 AM without any improvement in her left leg weakness. She continued to lean to the left. She wants to take a shower and lost her balance and suffered a mechanical fall. The patient denies any syncope.   CT of the brain was negative. MRI brain was negative for acute infarct but showed T1 hyperintensity of the oldest pelvis consistent with liver dysfunction.  Clinical Impression  Pt is moving at her baseline level of function with mildly staggering gait pattern.  Per pt this is normal for her.  She does not have any lower extremity deficits at this time and is choosing not to pursue f/u OP PT for her balance deficits as she believes she compensates well enough.  PT to sign off.  No further acute PT needed at this time.      PT Assessment  All further PT needs can be met in the next venue of care    Follow Up Recommendations  Outpatient PT;Other (comment) (for balance, but pt politely declining this service. )    Does the patient have the potential to tolerate intense rehabilitation     NA  Barriers to Discharge   None      Equipment Recommendations  None recommended by PT    Recommendations for Other Services   None  Frequency   NA- one time eval and d/c   Precautions / Restrictions Precautions Precautions: Fall   Pertinent Vitals/Pain See vitals flow sheet.        Mobility  Bed Mobility Overal bed mobility: Independent Transfers Overall transfer level: Independent Equipment used: None Ambulation/Gait Ambulation/Gait assistance: Modified independent (Device/Increase time) Ambulation Distance (Feet): 350 Feet Assistive device: None Gait Pattern/deviations: Staggering left;Staggering right Gait velocity: decreased General Gait Details: mildly staggering gait pattern.  Pt reports this is her baseline.  Slow speed as well, but able to walk faster when asked to do so.   Stairs: Yes Stairs assistance: Modified independent (Device/Increase time) Stair Management: One rail Right;Alternating pattern;Forwards Number of Stairs: 9 General stair comments: pt did stairs easily with support of the railing.         PT Diagnosis: Difficulty walking  PT Problem List: Decreased balance    PT Goals(Current goals can be found in the care plan section) Acute Rehab PT Goals PT Goal Formulation: No goals set, d/c therapy  Visit Information  Last PT Received On: 07/22/13 Assistance Needed: +1 History of Present Illness: 58 y.o. female admitted to [facility] on 07/21/13 with history of Crohn's disease, diabetes mellitus, bipolar disorder, liver cirrhosis with coagulopathy presents with left leg weakness that began when she woke up on the morning of admission around 4 AM. The patient felt like she was leaning to her left side and felt that her left leg was weak. She went back to bed. She woke up again around 6 AM without any improvement in her left leg weakness. She  continued to lean to the left. She wants to take a shower and lost her balance and suffered a mechanical fall. The patient denies any syncope.   CT of the brain was negative. MRI brain was negative for acute infarct but showed T1 hyperintensity of the oldest pelvis consistent with liver dysfunction.       Prior [place] expects to be discharged to:: Private  residence Living Arrangements: Spouse/significant other Available Help at Discharge: Family;Available PRN/intermittently Home Equipment: None Additional Comments: Pt reports history of being unsteady on her feet.  Some stumbles, but no real falls.   Prior Function Level of Independence: Independent Comments: Pt works as a Surveyor, mining.  Husband also works.   Communication Communication: No difficulties    Cognition  Cognition Arousal/Alertness: Awake/alert Behavior During Therapy: Flat affect Overall Cognitive Status: Within Functional Limits for tasks assessed    Extremity/Trunk Assessment Upper Extremity Assessment Upper Extremity Assessment: Defer to OT evaluation Lower Extremity Assessment Lower Extremity Assessment: Overall WFL for tasks assessed (5/5 per MMT seated EOB. Coordination and sensation are WNL) Cervical / Trunk Assessment Cervical / Trunk Assessment: Normal   Balance Standardized Balance Assessment Standardized Balance Assessment : Dynamic Gait Index Dynamic Gait Index Level Surface: Mild Impairment Change in Gait Speed: Mild Impairment Gait with Horizontal Head Turns: Mild Impairment Gait with Vertical Head Turns: Mild Impairment Gait and Pivot Turn: Mild Impairment Step Over Obstacle: Mild Impairment Step Around Obstacles: Mild Impairment Steps: Mild Impairment Total Score: 16  End of Session PT - End of Session Activity Tolerance: Patient tolerated treatment well Patient left: in bed;with call bell/phone within reach Nurse Communication: Mobility status    Wells Guiles B. [place], [place], DPT [phone]   07/22/2013, 5:07 PM

## 2013-07-22 NOTE — Progress Notes (Signed)
Utilization review completed. Aleta Manternach, RN, BSN. 

## 2013-07-22 NOTE — Progress Notes (Signed)
  Echocardiogram 2D Echocardiogram has been performed.  Priyal Musquiz 07/22/2013, 11:45 AM

## 2013-07-22 NOTE — Evaluation (Signed)
Speech Language Pathology Evaluation Patient Details Name: Sabrina QuailCynthia Pettibone MRN: [id number] DOB: [birth year] Today's Date: 07/22/2013 Time: [id number] SLP Time Calculation (min): 25 min  Problem List:  Patient Active Problem List   Diagnosis Date Noted  . Hypoglycemia 07/22/2013  . Undiagnosed cardiac murmurs 07/22/2013  . TIA (transient ischemic attack) 07/21/2013  . Coagulopathy 07/21/2013  . Diabetes mellitus type 2, noninsulin dependent 07/21/2013  . Ascites 03/25/2013  . Cirrhosis of liver 03/25/2013  . 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 Protein-calorie malnutrition, severe 02/25/2013  . 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 Gastric ulcer with hemorrhage 10/30/2012  . Edema 10/29/2012  . Chest pain 10/29/2012  . Occult blood in stools 10/29/2012  . Hypokalemia 10/29/2012  . Abnormal computed tomography of thoracic spine 09/22/2012  . Unspecified vitamin D deficiency 08/18/2012  . Recurrent aphthous ulcer 12/22/2011  . UTI (lower urinary tract infection) 09/25/2011  . Eustachian tube dysfunction 07/14/2011  . Aortic stenosis 03/20/2011  . Foot pain 12/26/2010  . Breast cancer screening 11/13/2010  . BIPOLAR AFFECTIVE DISORDER 07/09/2010  . MACULAR DEGENERATION 07/09/2010  . INTERSTITIAL CYSTITIS 07/09/2010  . CROHN'S DISEASE-LARGE INTESTINE 01/10/2009  . DIABETES MELLITUS 08/04/2007  . HYPERLIPIDEMIA 08/04/2007  . ANXIETY DEPRESSION 08/04/2007  . HYPERTENSION 08/04/2007  . ALLERGIC RHINITIS 08/04/2007  . COLITIS 08/04/2007  . CELIAC DISEASE 08/04/2007  . ARTHRITIS 08/04/2007  . DEGENERATIVE DISC DISEASE 08/04/2007  . DIARRHEA 08/04/2007   Past Medical History:  Past Medical History  Diagnosis Date  . Arthritis   . Allergic rhinitis   . Colitis   . Degenerative disc disease   . Hyperlipidemia   . Hypertension   . Celiac disease   . Bipolar disorder   . Macular degeneration   . Interstitial cystitis   . Diabetes mellitus without complication   . Anemia, iron  deficiency 10/30/2012  . Heart murmur   . Anxiety   . Depression   . Liver disease   . Gastric ulcer    Past Surgical History:  Past Surgical History  Procedure Laterality Date  . Tubal ligation    . Appendectomy    . Lumbar laminectomy    . Carpal tunnel release      bilateral  . Cesarean section    . Cysto/ hod/ instillatio clorpactin  07-03-2008  &  06-09-2001    INTERSTITIAL CYSTITIS  . Shoulder arthroscopy w/ rotator cuff repair  01-25-2008    LEFT SHOULDER /   INCLUDING CAPSULECTOMY WITH DEBRIDEMENT/  SAD WITH RESECTION OF AC  . Right shoulder arthroscopy/ open resection distal clavicle/ debridement adhesive capsulitis/ rotator cuff repair  08-30-2003  . Tonsillectomy and adenoidectomy    . Transthoracic echocardiogram  03-28-2011    NORMAL LVSF/ EF 60-65%/ VERY MILD AORTIC STENOSIS, TRIVIAL REGURG./ MILDLY DILATED LEFT ATRIUM  . Cystoscopy  11/26/2011    Procedure: CYSTOSCOPY;  Surgeon: Valetta Fulleravid S Grapey, MD;  [facility] [place];  Service: Urology;  Laterality: N/A;  clorpactin  in bladder  . 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 Cholecystectomy    . Spleenectomy     HPI:  58 year old female with a history of Crohn's disease, diabetes mellitus, bipolar disorder, liver cirrhosis with coagulopathy presents with left leg weakness that began when she woke up on the morning of admission around 4 AM. The patient felt like she was leaning to her left side and  felt that her left leg was weak. She went back to bed. She woke up again around 6 AM without any improvement in her left leg weakness. She continued to lean to the left. She wants to take a shower and lost her balance and suffered a mechanical fall. The patient denies any syncope. She denies any fevers, chills, chest discomfort, dizziness, shortness of breath, blurry vision,  dysarthria, upper extremity weakness, diplopia. Her deficits have returned back to baseline immediately. The patient was seen by neurology in [facility] ED who recommened transfer to [facility] for further care.CT of the brain was negative. MRI brain was negative for acute infarct    Assessment / Plan / Recommendation Clinical Impression  Patient appears to be WNL for cognitive-linguistic and speech function. No impairments noted in memory, reasoning, cognitive processing, awarenes, speech, no impairments in facial, lingual, labial  function. Patient verbalized undestanding ot reason for hospitalization, able to recall and describe medical testing and procedures and reasoning for them. Patient is cognitively intact and from a cognitive standpoint, is able to return to PLOF.    SLP Assessment  Patient does not need any further Speech Lanaguage Pathology Services    Follow Up Recommendations  None    Frequency and Duration        Pertinent Vitals/Pain    SLP Goals     SLP Evaluation Prior Functioning  Cognitive/Linguistic Baseline: Within functional limits Available Help at Discharge: Family;Available PRN/intermittently   Cognition  Overall Cognitive Status: Within Functional Limits for tasks assessed Arousal/Alertness: Awake/alert Orientation Level: Oriented X4 Attention: Selective Selective Attention: Appears intact Memory: Appears intact Awareness: Appears intact Problem Solving: Appears intact Executive Function: Reasoning;Organizing;Decision Making;Self Monitoring Reasoning: Appears intact Organizing: Appears intact Decision Making: Appears intact Self Monitoring: Appears intact    Comprehension  Auditory Comprehension Overall Auditory Comprehension: Appears within functional limits for tasks assessed Yes/No Questions: Within Functional Limits Commands: Within Functional Limits Visual Recognition/Discrimination Discrimination: Within Function Limits Reading Comprehension Reading  Status: Within funtional limits    Expression Expression Primary Mode of Expression: Verbal Verbal Expression Overall Verbal Expression: Appears within functional limits for tasks assessed Initiation: No impairment Non-Verbal Means of Communication: Not applicable Written Expression Written Expression: Not tested   Oral / Motor Oral Motor/Sensory Function Overall Oral Motor/Sensory Function: Appears within functional limits for tasks assessed Motor Speech Overall Motor Speech: Appears within functional limits for tasks assessed   GO     Pablo Lawrence 07/22/2013, 6:07 PM Angela Nevin, MA, CCC-SLP [facility] Speech-Language Pathologist

## 2013-07-22 NOTE — Progress Notes (Signed)
*  PRELIMINARY RESULTS* Vascular Ultrasound Carotid Duplex (Doppler) has been completed.   Findings suggest 1-39% internal carotid artery stenosis bilaterally. Vertebral arteries are patent with antegrade flow.  07/22/2013 2:44 PM Gertie Fey, RVT, RDCS, RDMS

## 2013-07-22 NOTE — Progress Notes (Addendum)
[facility] PROGRESS NOTE  Sabrina [name] EHU:[id number] DOB: 08/19/1955 DOA: 07/21/2013 PCP: Annye Asa, MD  Assessment/Plan: Left leg weakness/TIA  -Neurology is following and recommended transfer to [facility]  -MRI discussed above--negative for acute ischemic stroke  -Continue aspirin  -MRA of brain negative  -Carotid Doppler pending -TSH, hemoglobin A1c, lipid panel reviewed, LDL slightly elevated  Pyuria  -Continue ceftriaxone pending urine culture  -Patient complains of dysuria-->continue pentosan   Liver cirrhosis  -Patient has cholecystectomy and splenectomy in August 2014 during which time this was discovered  -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 Henrene Pastor (GI) closely outpatient  -No active bleeding presently  -Hemoglobin is being followed  Diabetes mellitus type 2, controlled  -Hold Januvia  -NovoLog sliding scale  -05/05/2013 hemoglobin A1c 5.4   Heart Murmur - 2D Echocardiogram pending - Outpatient follow up with Dr. Johnsie Cancel  Hypoglycemia - holding oral meds - resolved after patient started eating better - continue to monitor closely  Crohn's disease  -Continue mesalamine   Bipolar disorder  -Continue Prozac, Abilify, trazodone  Code Status: FULL Family Communication: Pt at bedside Disposition Plan: awaiting tests today   HPI/Subjective: Pt without complaints today  Objective: Filed Vitals:   07/22/13 0638  BP: 128/68  Pulse:   Temp:   Resp:    No intake or output data in the 24 hours ending 07/22/13 0828 Filed Weights   07/21/13 1017 07/21/13 2027  Weight: 147 lb (66.679 kg) 151 lb 7.3 oz (68.7 kg)    Exam:   General:  Awake, alert, no distress  Cardiovascular: normal s1, s2 sounds with loud murmur (systolic_  Respiratory: BBS clear to  auscultation   Abdomen: soft, nondistended, nontender   Musculoskeletal: no CCE   Data Reviewed: Basic Metabolic Panel:  Recent Labs Lab 07/21/13 1055 07/21/13 1104 07/22/13 0453  NA 137 142 143  K 3.8 3.8 3.5*  CL 104 105 110  CO2 25  --  24  GLUCOSE 234* 216* 107*  BUN 6 4* 7  CREATININE 0.59 0.70 0.51  CALCIUM 8.3*  --  8.0*   Liver Function Tests:  Recent Labs Lab 07/21/13 1055  AST 63*  ALT 31  ALKPHOS 130*  BILITOT 1.6*  PROT 8.4*  ALBUMIN 2.6*   No results found for this basename: LIPASE, AMYLASE,  in the last 168 hours No results found for this basename: AMMONIA,  in the last 168 hours CBC:  Recent Labs Lab 07/21/13 1055 07/21/13 1104 07/22/13 0453  WBC 6.9  --  7.1  NEUTROABS 4.0  --   --   HGB 12.4 15.3* 10.8*  HCT 35.4* 45.0 30.8*  MCV 89.6  --  89.0  PLT 248  --  236   Cardiac Enzymes:  Recent Labs Lab 07/21/13 1055  TROPONINI <0.30   BNP (last 3 results) No results found for this basename: PROBNP,  in the last 8760 hours CBG:  Recent Labs Lab 07/21/13 1015 07/21/13 2126 07/21/13 2146 07/21/13 2206 07/22/13 0755  GLUCAP 208* 67* 66* 78 83    No results found for this or any previous visit (from the past 240 hour(s)).   Studies: Dg Chest 2 View  07/22/2013   CLINICAL DATA:  CVA.  EXAM: CHEST  2 VIEW  COMPARISON:  Chest radiograph performed 09/20/2012  FINDINGS: The lungs are well-aerated and clear.  There is no evidence of focal opacification, pleural effusion or pneumothorax.  The heart is borderline enlarged. No acute osseous abnormalities are seen. Scattered clips are noted at the upper abdomen.  IMPRESSION: No acute cardiopulmonary process seen; borderline cardiomegaly.   Electronically Signed   By: Roanna Raider M.D.   On: 07/22/2013 02:12   Ct Head Wo Contrast  07/21/2013   CLINICAL DATA:  Left-sided weakness  EXAM: CT HEAD WITHOUT CONTRAST  TECHNIQUE: Contiguous axial images were obtained from the base of the skull  through the vertex without intravenous contrast.  COMPARISON:  None.  FINDINGS: Ventricle size is normal. Negative for acute infarct. Negative for hemorrhage or mass. Calvarium intact.  IMPRESSION: Negative   Electronically Signed   By: Marlan Palau M.D.   On: 07/21/2013 11:18   Mr Maxine Glenn Head Wo Contrast  07/21/2013   CLINICAL DATA:  Left-sided weakness.  Possible stroke or TIA.  EXAM: MRA HEAD WITHOUT CONTRAST  TECHNIQUE: Angiographic images of the Circle of Willis were obtained using MRA technique without intravenous contrast.  COMPARISON:  None.  FINDINGS: The visualized distal vertebral arteries are patent. The left vertebral artery is hypoplastic distal to the PICA origin. PICA origins visualized bilaterally and are patent. The basilar artery is patent without stenosis. Right AICA is dominant. SCAs appear patent. PCAs are unremarkable. A prominent right posterior communicating artery is present.  Internal carotid arteries are patent from skullbase to carotid termini. ACAs and MCAs are unremarkable. No intracranial aneurysm is identified.  IMPRESSION: Unremarkable head MRA.   Electronically Signed   By: Sebastian Ache   On: 07/21/2013 17:12   Mr Brain Wo Contrast  07/21/2013   CLINICAL DATA:  Acute onset of left-sided weakness. Stroke versus TIA.  EXAM: MRI HEAD WITHOUT CONTRAST  TECHNIQUE: Multiplanar, multiecho pulse sequences of the brain and surrounding structures were obtained without intravenous contrast.  COMPARISON:  Head CT 07/21/2013 and brain MRI 03/03/2009  FINDINGS: There is abnormal T1 hyperintensity involving the globi pallidi bilaterally extending into the cerebral peduncles. There is mild cerebral atrophy. There is no evidence of acute infarct, intracranial hemorrhage, mass, midline shift, or extra-axial fluid collection. Orbits are normal. Major intracranial vascular flow voids are unremarkable. Visualized paranasal sinuses and mastoid air cells are clear.  IMPRESSION: 1. No evidence of  acute infarct. 2. T1 hyperintensity in the globi pallidi. This is most commonly seen in the setting of liver dysfunction.   Electronically Signed   By: Sebastian Ache   On: 07/21/2013 13:42    Scheduled Meds: . ARIPiprazole  10 mg Oral QHS  . aspirin  300 mg Rectal Daily   Or  . aspirin  325 mg Oral Daily  . cefTRIAXone (ROCEPHIN)  IV  1 g Intravenous Q24H  . FLUoxetine  40 mg Oral Daily  . furosemide  20 mg Oral Daily  . insulin aspart  0-9 Units Subcutaneous TID WC  . iron polysaccharides  150 mg Oral Daily  . loratadine  10 mg Oral Daily  . LORazepam  0.5 mg Oral BID  . mesalamine  1.2 g Oral BID  . pantoprazole  40 mg Oral Daily  . pentosan polysulfate  200 mg Oral Custom  . traZODone  25 mg Oral QHS   Continuous Infusions:   Active Problems:   CROHN'S DISEASE-LARGE INTESTINE   BIPOLAR AFFECTIVE DISORDER   Cirrhosis of liver   TIA (transient ischemic attack)   Coagulopathy   Diabetes mellitus type 2, noninsulin dependent   [place]  [facility]  [facility] Pager [phone]. If 7PM-7AM, please contact night-coverage at [email or web address], password [facility] 07/22/2013, 8:28 AM  LOS: 1 day

## 2013-07-22 NOTE — Progress Notes (Signed)
Stroke Team Progress Note  HISTORY Sabrina [name] is an 58 y.o. female with liver disease, INR 1.98, bipolar, HTN, DM, hyperlipidemia who went to sleep last night 07/21/2013 at 10 PM feeling normal. This AM 07/21/2013 she awoke at 4 AM to go to the bathroom and noted she was drifting to the left. She went back to sleep but on waking she felt she was still drifting to left when walking. Husband states he noted her actually drifting into a wall. Patient called PCP who told her to go to ED. At time of consultation she was back to her baseline and had no complaints of weakness, imbalance, decreased sensation, dysarthria,dysphagia, diplopia. MRI brain showed no acute infarct.  Patient was not administerd TPA secondary to delay in arrival. She was admitted for further evaluation and treatment.  SUBJECTIVE No family is at the bedside.  Overall she feels her condition is completely resolved.   OBJECTIVE Most recent Vital Signs: Filed Vitals:   07/21/13 2027 07/22/13 0002 07/22/13 0400 07/22/13 0638  BP: 130/46 112/52 95/48 128/68  Pulse: 65 70 78   Temp: 98.3 F (36.8 C) 98 F (36.7 C) 98.1 F (36.7 C)   TempSrc: Oral Oral    Resp: 18 15 15    Height: 5\' 4"  (1.626 m)     Weight: 68.7 kg (151 lb 7.3 oz)     SpO2: 99% 97% 93%    CBG (last 3)   Recent Labs  07/21/13 2146 07/21/13 2206 07/22/13 0755  GLUCAP 66* 78 83    IV Fluid Intake:     MEDICATIONS  . ARIPiprazole  10 mg Oral QHS  . aspirin  300 mg Rectal Daily   Or  . aspirin  325 mg Oral Daily  . cefTRIAXone (ROCEPHIN)  IV  1 g Intravenous Q24H  . FLUoxetine  40 mg Oral Daily  . furosemide  20 mg Oral Daily  . insulin aspart  0-9 Units Subcutaneous TID WC  . iron polysaccharides  150 mg Oral Daily  . loratadine  10 mg Oral Daily  . LORazepam  0.5 mg Oral BID  . mesalamine  1.2 g Oral BID  . pantoprazole  40 mg Oral Daily  . pentosan polysulfate  200 mg Oral Custom  . traZODone  25 mg Oral QHS   PRN:  acetaminophen,  acetaminophen  Diet:  Cardiac thin liquids Activity:  OOB with assistance DVT Prophylaxis:  Scds ordered  CLINICALLY SIGNIFICANT STUDIES Basic Metabolic Panel:  Recent Labs Lab 07/21/13 1055 07/21/13 1104 07/22/13 0453  NA 137 142 143  K 3.8 3.8 3.5*  CL 104 105 110  CO2 25  --  24  GLUCOSE 234* 216* 107*  BUN 6 4* 7  CREATININE 0.59 0.70 0.51  CALCIUM 8.3*  --  8.0*   Liver Function Tests:  Recent Labs Lab 07/21/13 1055  AST 63*  ALT 31  ALKPHOS 130*  BILITOT 1.6*  PROT 8.4*  ALBUMIN 2.6*   CBC:  Recent Labs Lab 07/21/13 1055 07/21/13 1104 07/22/13 0453  WBC 6.9  --  7.1  NEUTROABS 4.0  --   --   HGB 12.4 15.3* 10.8*  HCT 35.4* 45.0 30.8*  MCV 89.6  --  89.0  PLT 248  --  236   Coagulation:  Recent Labs Lab 07/21/13 1055 07/22/13 0453  LABPROT 21.9* 23.6*  INR 1.98* 2.18*   Cardiac Enzymes:  Recent Labs Lab 07/21/13 1055  TROPONINI <0.30   Urinalysis:  Recent Labs Lab 07/21/13  1140  COLORURINE YELLOW  LABSPEC 1.009  PHURINE 6.0  GLUCOSEU NEGATIVE  HGBUR NEGATIVE  BILIRUBINUR NEGATIVE  KETONESUR NEGATIVE  PROTEINUR NEGATIVE  UROBILINOGEN 0.2  NITRITE NEGATIVE  LEUKOCYTESUR LARGE*   Lipid Panel    Component Value Date/Time   CHOL 142 07/22/2013 0453   TRIG 67 07/22/2013 0453   HDL 20* 07/22/2013 0453   CHOLHDL 7.1 07/22/2013 0453   VLDL 13 07/22/2013 0453   LDLCALC 109* 07/22/2013 0453   HgbA1C  Lab Results  Component Value Date   HGBA1C 5.4 05/05/2013    Urine Drug Screen:     Component Value Date/Time   LABOPIA NONE DETECTED 07/21/2013 1140   COCAINSCRNUR NONE DETECTED 07/21/2013 1140   LABBENZ NONE DETECTED 07/21/2013 1140   AMPHETMU NONE DETECTED 07/21/2013 1140   THCU NONE DETECTED 07/21/2013 1140   LABBARB NONE DETECTED 07/21/2013 1140    Alcohol Level:  Recent Labs Lab 07/21/13 1055  ETH <11    CT of the brain  07/21/2013    Negative    MRI of the brain  07/21/2013    1. No evidence of acute infarct. 2. T1  hyperintensity in the globi pallidi. This is most commonly seen in the setting of liver dysfunction.    MRA of the brain  07/21/2013   Unremarkable head MRA.  2D Echocardiogram    Carotid Doppler    CXR  07/22/2013    No acute cardiopulmonary process seen; borderline cardiomegaly.    EKG  normal sinus rhythm. For complete results please see formal report.   Therapy Recommendations Pending  Physical Exam  General: The patient is alert and cooperative at the time of the examination.  Skin: No significant peripheral edema is noted.   Neurologic Exam  Mental status: The patient is oriented x 3.  Cranial nerves: Facial symmetry is present. Speech is normal, no aphasia or dysarthria is noted. Extraocular movements are full. Visual fields are full.  Motor: The patient has good strength in all 4 extremities.  Sensory examination: Soft touch sensation is symmetric on the face, arms, and legs.  Coordination: The patient has good finger-nose-finger and heel-to-shin bilaterally. No asterixis is seen.  Gait and station: The gait was not tested.  Reflexes: Deep tendon reflexes are symmetric.    ASSESSMENT Ms. Sabrina [name] is a 58 y.o. female presenting with drifting to the left that resolved. Imaging confirms no acute infarct. Dx:  Right brain TIA. On no antithrombotics prior to admission. Now on aspirin 325 mg orally every day for secondary stroke prevention. Patient with no resultant neuro symptoms. Work up underway.  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  F/u 2D and carotid doppler  Likely no therapy needs  Annie Main,  MSN, RN, ANVP-BC, ANP-BC, GNP-BC [facility] Pager: [phone] 07/22/2013 8:27 AM  I have personally obtained a history, examined the patient, evaluated imaging results, and formulated the assessment and plan of care. I agree with the above. Lesly Dukes

## 2013-07-23 DIAGNOSIS — G459 Transient cerebral ischemic attack, unspecified: Secondary | ICD-10-CM

## 2013-07-23 DIAGNOSIS — I359 Nonrheumatic aortic valve disorder, unspecified: Secondary | ICD-10-CM

## 2013-07-23 DIAGNOSIS — D689 Coagulation defect, unspecified: Secondary | ICD-10-CM

## 2013-07-23 DIAGNOSIS — E119 Type 2 diabetes mellitus without complications: Secondary | ICD-10-CM

## 2013-07-23 DIAGNOSIS — F319 Bipolar disorder, unspecified: Secondary | ICD-10-CM

## 2013-07-23 LAB — BASIC METABOLIC PANEL
BUN: 6 mg/dL (ref 6–23)
CALCIUM: 8.3 mg/dL — AB (ref 8.4–10.5)
CO2: 23 meq/L (ref 19–32)
Chloride: 107 mEq/L (ref 96–112)
Creatinine, Ser: 0.48 mg/dL — ABNORMAL LOW (ref 0.50–1.10)
GFR calc Af Amer: >90 mL/min (ref >90)
GFR calc non Af Amer: >90 mL/min (ref >90)
GLUCOSE: 82 mg/dL (ref 70–99)
Potassium: 4.1 mEq/L (ref 3.7–5.3)
Sodium: 140 mEq/L (ref 137–147)

## 2013-07-23 LAB — URINE CULTURE: Colony Count: >=100000

## 2013-07-23 LAB — GLUCOSE, CAPILLARY: Glucose-Capillary: 82 mg/dL (ref 70–99)

## 2013-07-23 MED ORDER — CEPHALEXIN 500 MG PO CAPS: 500.0000 mg | ORAL_CAPSULE | Freq: Two times a day (BID) | ORAL | Status: DC

## 2013-07-23 MED ORDER — ASPIRIN 81 MG PO TABS: 81.0000 mg | ORAL_TABLET | Freq: Every day | ORAL | Status: DC

## 2013-07-23 MED ORDER — ASPIRIN 325 MG PO TABS: 325.0000 mg | ORAL_TABLET | Freq: Every day | ORAL | Status: DC

## 2013-07-23 NOTE — Discharge Instructions (Signed)
Monitor Blood sugars closely and report to your primary care physician See your primary care physician in 1 week for repeat urinalysis and follow up on urine culture Discuss your cholesterol with your primary care physician and discuss if you should start cholesterol medications See Dr. Henrene Pastor as soon as you can to discuss your medications Return if symptoms recur, worsen or new problems develop.    Cirrhosis Cirrhosis is a condition of scarring of the liver which is caused when the liver has tried repairing itself following damage. This damage may come from a previous infection such as one of the forms of hepatitis (usually hepatitis C), or the damage may come from being injured by toxins. The main toxin that causes this damage is alcohol. The scarring of the liver from use of alcohol is irreversible. That means the liver cannot return to normal even though alcohol is not used any more. The main danger of hepatitis C infection is that it may cause long-lasting (chronic) liver disease, and this also may lead to cirrhosis. This complication is progressive and irreversible. CAUSES  Prior to available blood tests, hepatitis C could be contracted by blood transfusions. Since testing of blood has improved, this is now unlikely. This infection can also be contracted through intravenous drug use and the sharing of needles. It can also be contracted through sexual relationships. The injury caused by alcohol comes from too much use. It is not a few drinks that poison the liver, but years of misuse. Usually there will be some signs and symptoms early with scarring of the liver that suggest the development of better habits. Alcohol should never be used while using acetaminophen. A small dose of both taken together may cause irreversible damage to the liver. HOME CARE INSTRUCTIONS  There is no specific treatment for cirrhosis. However, there are things you can do to avoid making the condition worse.  Rest as  needed.  Eat a well-balanced diet. Your caregiver can help you with suggestions.  Vitamin supplements including vitamins A, K, D, and thiamine can help.  A low-salt diet, water restriction, or diuretic medicine may be needed to reduce fluid retention.  Avoid alcohol. This can be extremely toxic if combined with acetaminophen.  Avoid drugs which are toxic to the liver. Some of these include isoniazid, methyldopa, acetaminophen, anabolic steroids (muscle-building drugs), erythromycin, and oral contraceptives (birth control pills). Check with your caregiver to make sure medicines you are presently taking will not be harmful.  Periodic blood tests may be required. Follow your caregiver's advice regarding the timing of these.  Milk thistle is an herbal remedy which does protect the liver against toxins. However, it will not help once the liver has been scarred. SEEK MEDICAL CARE IF:  You have increasing fatigue or weakness.  You develop swelling of the hands, feet, legs, or face.  You vomit bright red blood, or a coffee ground appearing material.  You have blood in your stools, or the stools turn black and tarry.  You have a fever.  You develop loss of appetite, or have nausea and vomiting.  You develop jaundice.  You develop easy bruising or bleeding.  You have worsening of any of the problems you are concerned about. Document Released: 06/16/2005 Document Revised: 09/08/2011 Document Reviewed: 02/02/2008 [facility] Patient Information 2014 [place].  Diabetes and Sick Day Management Blood sugar (glucose) can be more difficult to control when you are sick. Colds, fever, flu, nausea, vomiting, and diarrhea are all examples of common illnesses that  can cause problems for people with diabetes. Loss of body fluids (dehydration) from fever, vomiting, diarrhea, infection, and the stress of a sickness can all cause blood glucose levels to increase. Because of this, it is very  important to take your diabetes medicines and to eat some form of carbohydrate food when you are sick. Liquid or soft foods are often tolerated, and they help to replace fluids. HOME CARE INSTRUCTIONS These main guidelines are intended for managing a short-term (24 hours or less) sickness:  Take your usual dose of insulin or oral diabetes medicine. An exception would be if you take any form of metformin. If you cannot eat or drink, you can become dehydrated and should not take this medicine.  Continue to take your insulin even if you are unable to eat solid foods or are vomiting. Your insulin dose may stay the same, or it may need to be increased when you are sick.  You will need to test your blood glucose more often, generally every 2 4 hours. If you have type 1 diabetes, test your urine for ketones every 4 hours. If you have type 2 diabetes, test your urine for ketones as directed by your health care provider.  Eat some form of food that contains carbohydrates. The carbohydrates can be in solid or liquid form. You should eat 45 50 g of carbohydrates every 3 4 hours.  Replace fluids if you have a fever, vomit, or have diarrhea. Ask your health care provider for specific rehydration instructions.  Watch carefully for the signs of ketoacidosis if you have type 1 diabetes. Call your health care provider if any of the following symptoms are present, especially in children:  Moderate to large ketones in the urine along with a high blood glucose level.  Severe nausea.  Vomiting.  Diarrhea.  Abdominal pain.  Rapid breathing.  Drink extra liquids that do not contain sugar, such as water or sugar-free liquids, or caffeine.  Be careful with over-the-counter medicines. Read the labels. They may contain sugar or types of sugars that can increase your blood glucose level. Food Choices for Illness All of the food choices below contain about 15 g of carbohydrates. Plan ahead and keep some of these  foods around.    to  cup carbonated beverage containing sugar. Carbonated beverages will usually be better tolerated if they are opened and left at room temperature for a few minutes.   of a twin frozen ice pop.   cup regular gelatin.   cup juice.   cup ice cream or frozen yogurt.   cup cooked cereal.   cup sherbet.  1 cup clear broth or soup.  1 cup cream soup.   cup regular custard.   cup regular pudding.  1 cup sports drink.  1 cup plain yogurt.  1 slice toast.  6 squares saltine crackers.  5 vanilla wafers.   cup sports drink. SEEK MEDICAL CARE IF:   You are unable to drink fluids, even small amounts.  You have nausea and vomiting for more than 6 hours.  You have diarrhea for more than 6 hours.  Your blood glucose level is more than 240 mg/dL, even with additional insulin.  There is a change in mental status.  You develop an additional serious sickness.  You have been sick for 2 days and are not getting better.  You have had a fever for 2 days. SEEK IMMEDIATE MEDICAL CARE IF:  You have difficulty breathing.  You have moderate to large  ketone levels. MAKE SURE YOU:  Understand these instructions.  Will watch your condition.  Will get help right away if you are not doing well or get worse. Document Released: 06/19/2003 Document Revised: 02/16/2013 Document Reviewed: 11/23/2012 [facility] Patient Information 2014 [place].  Diabetes and Exercise Diabetes mellitus is a common, chronic disease, in which the pancreas is unable to adequately control blood glucose (sugar) levels. There are 2 types of diabetes. Type 1 diabetes patients are unable to produce insulin, a hormone that causes sugar in the blood to be stored in the body. People with type 1 diabetes may compensate by giving themselves injections of insulin. Type 2 diabetes involves not producing adequate amounts of insulin to control blood glucose levels. People with type 2  diabetes control their blood glucose by monitoring their food intake or by taking medicine. Exercise is an important part of diabetes treatment. During exercise, the muscles use a greater amount of glucose from the blood for energy. This lowers your blood glucose, which is the same effect you would get from taking insulin. It has been shown that endurance athletes are more sensitive to insulin than inactive people. SYMPTOMS  Many people with a mild case of diabetes have no symptoms. However, if left uncontrolled, diabetes can lead to several complications that could be prevented with treatment of the disease. General symptoms of diabetes include:   Frequent urination (polyuria).  Frequent thirst and drinking (polydipsia).  Increased food consumption (polyphagia).  Fatigue.  Poor exercise performance.  Blurred vision.  Inflammation of the vagina (vaginitis) caused by fungal infections.  Skin infections (uncommon).  Numbness in the feet,caused by nerve injury.  Kidney disease. CAUSES  The cause of most cases of diabetes is unknown. In children, diabetes is often due to an autoimmune response to the cells in the pancreas that make insulin. It is also linked with other diseases, such as cystic fibrosis. Diabetes may have a genetic link. PREVENTION  Athletes should strive to begin exercise with blood glucose in a well-controlled state.  Feet should always be kept clean and dry.  Activities in which low blood sugar levels cannot be treated easily (scuba diving, rock climbing, swimming) should be avoided.  Anticipate alterations in diet or training to avoid low blood sugar (hypoglycemia) and high blood sugar (hyperglycemia).  Athletes should try to increase sugar consumption after strenuous exercise to avoid hypoglycemia.  Short-acting insulin should not be injected into an actively exercising muscle. The athlete should rest the injection site for about 1 hour after  exercise.  Patients with diabetes should get routine checkups of the feet to prevent complications. PROGNOSIS  Exercise provides many benefits to the person with diabetes:   Reduced body fat.  Lower blood pressure.  Often, reduced need for medicines.  Improved exercise tolerance.  Lower insulin levels.  Weight loss.  Improved lipid profile (decreased cholesterol and low-density lipoproteins). RELATED COMPLICATIONS  If performed incorrectly, exercise can result in complications of diabetes:   Poor control of blood sugar, when exercise is performed at the wrong time.  Increase in renal disease, from loss of body fluids (dehydration).  Increased risk of nerve injury (neuropathy) when performing exercises that increase foot injury.  Increased risk of eye problems when performingactivities that involve breath holding or lowering or jarring the head.  Increased risk of sudden death from exercise in patients with heart disease.  Worsening of hypertension with heavy lifting (more than 10 lb/4.5 kg). Altered blood glucose and insulin dose as a result of  mild illness that produces loss of appetite.  Altered uptake of insulin after injection when insulin injection site is changed. NOTE: Exercise can lower blood glucose effectively, but the effects are short-lasting (no more than a couple of days). Exercise has been shown to improve your sensitivity to insulin. This may alter how your body responds to a given dose of injected insulin. It is important for every patient with diabetes to know how his or her body may react to exercise, and to adjust insulin dosages accordingly. TREATMENT  Eat about 1 to 3 hours before exercise.  Check blood glucose immediately before and after exercise.  Stop exercise if blood glucose is more than 250 mg/dL.  Stop exercise if blood glucose is less than 100 mg/dL.  Do not exercise within 1 hour of an insulin injection.  Be prepared to treat low blood  glucose while exercising. Keep some sugar product with you, such as a candy bar.  For prolonged exercise, use a sports drink to maintain your glucose level.  Replace used up glucose in the body after exercise.  Consume fluids during and after exercise to avoid dehydration. SEEK MEDICAL CARE IF:  You have vision changes after a run.  You notice a loss of sensation in your feet after exercise.  You have increased numbness, tingling, or pins and needles sensations after exercise.  You have chest pain during or after exercise.  You have a fast, irregular heartbeat (palpitations) during or after exercise.  Your exercise tolerance gets worse.  You have fainting or dizzy spells for brief periods during or after exercise. Document Released: 06/16/2005 Document Revised: 09/08/2011 Document Reviewed: 09/28/2008 [facility] Patient Information 2014 [place], [place].  Diabetes and Exercise Exercising regularly is important. It is not just about losing weight. It has many health benefits, such as:  Improving your overall fitness, flexibility, and endurance.  Increasing your bone density.  Helping with weight control.  Decreasing your body fat.  Increasing your muscle strength.  Reducing stress and tension.  Improving your overall health. People with diabetes who exercise gain additional benefits because exercise:  Reduces appetite.  Improves the body's use of blood sugar (glucose).  Helps lower or control blood glucose.  Decreases blood pressure.  Helps control blood lipids (such as cholesterol and triglycerides).  Improves the body's use of the hormone insulin by:  Increasing the body's insulin sensitivity.  Reducing the body's insulin needs.  Decreases the risk for heart disease because exercising:  Lowers cholesterol and triglycerides levels.  Increases the levels of good cholesterol (such as high-density lipoproteins [HDL]) in the body.  Lowers blood glucose  levels. YOUR ACTIVITY PLAN  Choose an activity that you enjoy and set realistic goals. Your health care provider or diabetes educator can help you make an activity plan that works for you. You can break activities into 2 or 3 sessions throughout the day. Doing so is as good as one long session. Exercise ideas include:  Taking the dog for a walk.  Taking the stairs instead of the elevator.  Dancing to your favorite song.  Doing your favorite exercise with a friend. RECOMMENDATIONS FOR EXERCISING WITH TYPE 1 OR TYPE 2 DIABETES   Check your blood glucose before exercising. If blood glucose levels are greater than 240 mg/dL, check for urine ketones. Do not exercise if ketones are present.  Avoid injecting insulin into areas of the body that are going to be exercised. For example, avoid injecting insulin into:  The arms when playing tennis.  The legs when jogging.  Keep a record of:  Food intake before and after you exercise.  Expected peak times of insulin action.  Blood glucose levels before and after you exercise.  The type and amount of exercise you have done.  Review your records with your health care provider. Your health care provider will help you to develop guidelines for adjusting food intake and insulin amounts before and after exercising.  If you take insulin or oral hypoglycemic agents, watch for signs and symptoms of hypoglycemia. They include:  Dizziness.  Shaking.  Sweating.  Chills.  Confusion.  Drink plenty of water while you exercise to prevent dehydration or heat stroke. Body water is lost during exercise and must be replaced.  Talk to your health care provider before starting an exercise program to make sure it is safe for you. Remember, almost any type of activity is better than none. Document Released: 09/06/2003 Document Revised: 02/16/2013 Document Reviewed: 11/23/2012 [facility] Patient Information 2014 [place].  Diabetes and Foot  Care Diabetes may cause you to have problems because of poor blood supply (circulation) to your feet and legs. This may cause the skin on your feet to become thinner, break easier, and heal more slowly. Your skin may become dry, and the skin may peel and crack. You may also have nerve damage in your legs and feet causing decreased feeling in them. You may not notice minor injuries to your feet that could lead to infections or more serious problems. Taking care of your feet is one of the most important things you can do for yourself.  HOME CARE INSTRUCTIONS  Wear shoes at all times, even in the house. Do not go barefoot. Bare feet are easily injured.  Check your feet daily for blisters, cuts, and redness. If you cannot see the bottom of your feet, use a mirror or ask someone for help.  Wash your feet with warm water (do not use hot water) and mild soap. Then pat your feet and the areas between your toes until they are completely dry. Do not soak your feet as this can dry your skin.  Apply a moisturizing lotion or petroleum jelly (that does not contain alcohol and is unscented) to the skin on your feet and to dry, brittle toenails. Do not apply lotion between your toes.  Trim your toenails straight across. Do not dig under them or around the cuticle. File the edges of your nails with an emery board or nail file.  Do not cut corns or calluses or try to remove them with medicine.  Wear clean socks or stockings every day. Make sure they are not too tight. Do not wear knee-high stockings since they may decrease blood flow to your legs.  Wear shoes that fit properly and have enough cushioning. To break in new shoes, wear them for just a few hours a day. This prevents you from injuring your feet. Always look in your shoes before you put them on to be sure there are no objects inside.  Do not cross your legs. This may decrease the blood flow to your feet.  If you find a minor scrape, cut, or break in  the skin on your feet, keep it and the skin around it clean and dry. These areas may be cleansed with mild soap and water. Do not cleanse the area with peroxide, alcohol, or iodine.  When you remove an adhesive bandage, be sure not to damage the skin around it.  If you  have a wound, look at it several times a day to make sure it is healing.  Do not use heating pads or hot water bottles. They may burn your skin. If you have lost feeling in your feet or legs, you may not know it is happening until it is too late.  Make sure your health care provider performs a complete foot exam at least annually or more often if you have foot problems. Report any cuts, sores, or bruises to your health care provider immediately. SEEK MEDICAL CARE IF:   You have an injury that is not healing.  You have cuts or breaks in the skin.  You have an ingrown nail.  You notice redness on your legs or feet.  You feel burning or tingling in your legs or feet.  You have pain or cramps in your legs and feet.  Your legs or feet are numb.  Your feet always feel cold. SEEK IMMEDIATE MEDICAL CARE IF:   There is increasing redness, swelling, or pain in or around a wound.  There is a red line that goes up your leg.  Pus is coming from a wound.  You develop a fever or as directed by your health care provider.  You notice a bad smell coming from an ulcer or wound. Document Released: 06/13/2000 Document Revised: 02/16/2013 Document Reviewed: 11/23/2012 [facility] Patient Information 2014 [place].   Diabetes and Standards of Medical Care  Diabetes is complicated. You may find that your diabetes team includes a dietitian, nurse, diabetes educator, eye doctor, and more. To help everyone know what is going on and to help you get the care you deserve, the following schedule of care was developed to help keep you on track. Below are the tests, exams, vaccines, medicines, education, and plans you will need. HbA1c  test This test shows how well you have controlled your glucose over the past 2 3 months. It is used to see if your diabetes management plan needs to be adjusted.   It is performed at least 2 times a year if you are meeting treatment goals.  It is performed 4 times a year if therapy has changed or if you are not meeting treatment goals. Blood pressure test  This test is performed at every routine medical visit. The goal is less than 140/90 mmHg for most people, but 130/80 mmHg in some cases. Ask your health care provider about your goal. Dental exam  Follow up with the dentist regularly. Eye exam  If you are diagnosed with type 1 diabetes as a child, get an exam upon reaching the age of 59 years or older and have had diabetes for 3 5 years. Yearly eye exams are recommended after that initial eye exam.  If you are diagnosed with type 1 diabetes as an adult, get an exam within 5 years of diagnosis and then yearly.  If you are diagnosed with type 2 diabetes, get an exam as soon as possible after the diagnosis and then yearly. Foot care exam  Visual foot exams are performed at every routine medical visit. The exams check for cuts, injuries, or other problems with the feet.  A comprehensive foot exam should be done yearly. This includes visual inspection as well as assessing foot pulses and testing for loss of sensation.  Check your feet nightly for cuts, injuries, or other problems with your feet. Tell your health care provider if anything is not healing. Kidney function test (urine microalbumin)  This test is performed  once a year.  Type 1 diabetes: The first test is performed 5 years after diagnosis.  Type 2 diabetes: The first test is performed at the time of diagnosis.  A serum creatinine and estimated glomerular filtration rate (eGFR) test is done once a year to assess the level of chronic kidney disease (CKD), if present. Lipid profile (cholesterol, HDL, LDL,  triglycerides)  Performed every 5 years for most people.  The goal for LDL is less than 100 mg/dL. If you are at high risk, the goal is less than 70 mg/dL.  The goal for HDL is 40 mg/dL 50 mg/dL for men and 50 mg/dL 60 mg/dL for women. An HDL cholesterol of 60 mg/dL or higher gives some protection against heart disease.  The goal for triglycerides is less than 150 mg/dL. Influenza vaccine, pneumococcal vaccine, and hepatitis B vaccine  The influenza vaccine is recommended yearly.  The pneumococcal vaccine is generally given once in a lifetime. However, there are some instances when another vaccination is recommended. Check with your health care provider.  The hepatitis B vaccine is also recommended for adults with diabetes. Diabetes self-management education  Education is recommended at diagnosis and ongoing as needed. Treatment plan  Your treatment plan is reviewed at every medical visit. Document Released: 04/13/2009 Document Revised: 02/16/2013 Document Reviewed: 11/16/2012 [facility] Patient Information 2014 [place].  Monitoring for Diabetes There are two blood tests that help you monitor and manage your diabetes. These include:  An A1c (hemoglobin A1c) test.  This test is an average of your glucose (or blood sugar) control over the past 3 months.  This is recommended as a way for you and your caregiver to understand how well your glucose levels are controlled on the average.  Your A1c goal will be determined by your caregiver, but it is usually best if it is less than 6.5% to 7.0%.  Glucose (sugar) attaches itself to red blood cells. The amount of glucose then can then be measured. The amount of glucose on the cells depends on how high your blood glucose has been.  SMBG test (self-monitoring blood glucose).  Using a blood glucose monitor (meter) to do SMBG testing is an easy way to monitor the amount of glucose in your blood and can help you improve your control.  The monitor will tell you what your blood glucose is at that very moment. Every person with diabetes should have a blood glucose monitor and know how to use it. The better you control your blood sugar on a daily basis, the better your A1c levels will be. HOW OFTEN SHOULD I HAVE AN A1C LEVEL?  Every 3 months if your diabetes is not well controlled or if therapy has changed.  Every 6 months if you are meeting your treatment goals. HOW OFTEN SHOULD I DO SMBG TESTING?  Your caregiver will recommend how often you should test. Testing times are based on the kind of medicine you take, type of diabetes you have, and your blood glucose control. Testing times can include:  Type 1 diabetes: test 3 or 4 times a day or as directed.  Type 2 diabetes and if you are taking insulin and diabetes pills: test 3 or 4 times a day or as directed.  If you are taking diabetes pills only and not reaching your target A1c: test 2 to 4 times a day or as directed.  If you are taking diabetes pills and are controlling your diabetes well with diet and exercise, your caregiver will  help you decide what is appropriate. WHAT TIME OF DAY SHOULD I TEST?  The best time of day to test your blood glucose depends on medications, mealtimes, exercise, and blood glucose control. It is best to test at different times because this will help you know how you are doing throughout the day. Your caregiver will help you decide what is best. WHAT SHOULD MY BLOOD GLUCOSE BE? Blood glucose target goals may vary depending on each persons needs, whether they have type 1 or type 2 diabetes or what medications they are taking. However, as a general rule, blood glucose should be:  Before meals   70-130 mg/dl.  After meals    ..less than 180 mg/dl. CHECK YOUR BLOOD GLUCOSE IF:  You have symptoms of low blood sugar (hypoglycemia), which may include dizziness, shaking, sweating, chills and confusion.  You have symptoms of high blood sugar  (hyperglycemia), which may include sleepiness, blurred vision, frequent urination and excessive thirst.  You are learning how meals, physical activity and medicine affect your blood glucose level. The more you learn about how various foods, your medications, and activities affect you, the better job you will do of taking care of yourself.  You have a job in which poor control could cause safety problems while driving or operating machinery. CHECK YOUR BLOOD SUGAR MORE FREQUENTLY:  If you have medication or dietary changes.  If you begin taking other kinds of medicines.  If you become sick or your level of stress increases. With an illness, your blood sugar may even be high without eating.  Before and after exercise. Follow your caregiver's testing recommendations during this time.  TO DISPOSE OF SHARPS: Each city or state may have different regulations. Check with your public works or Theatre manager.  Sharps containers can be purchased from pharmacies.  Place all used sharps in a container. You do not need to replace any protective covers over the needle or break the needle.  Sharps should be contained in a ridge, leakproof, puncture-resistant container.  Plastic detergent bottle.  Bleach bottle.  When container is almost full, add a solution that is 1 part laundry bleach and 9 parts tap water (it is okay to use undiluted bleach if you wish). You may want to wear gloves since bleach can damage tissue. Let the solution sit for 30 minutes.  Carefully pour all the liquid into the sanitary sewer. Be sure to prevent the sharps from falling out.  Once liquid is drained, reseal the container with lid and tape it shut with duct tape. This will prevent the cap from coming off.  Dispose of the container with your regular household trash and waste. It is a good idea to let your trash hauler know that you will be disposing of sharps. Document Released: 06/19/2003 Document Revised:  03/10/2012 Document Reviewed: 12/18/2008 [facility] Patient Information 2014 [place], [place].  Low Blood Sugar Low blood sugar (hypoglycemia) means that the level of sugar in your blood is lower than it should be. Signs of low blood sugar include:  Getting sweaty.  Feeling hungry.  Feeling dizzy or weak.  Feeling sleepier than normal.  Feeling nervous.  Headaches.  Having a fast heartbeat. Low blood sugar can happen fast and can be an emergency. Your doctor can do tests to check your blood sugar level. You can have low blood sugar and not have diabetes. HOME CARE  Check your blood sugar as told by your doctor. If it is less than 70 mg/dl or as  told by your doctor, take 1 of the following:  3 to 4 glucose tablets.   cup clear juice.   cup soda pop, not diet.  1 cup milk.  5 to 6 hard candies.  Recheck blood sugar after 15 minutes. Repeat until it is at the right level.  Eat a snack if it is more than 1 hour until the next meal.  Only take medicine as told by your doctor.  Do not skip meals. Eat on time.  Do not drink alcohol except with meals.  Check your blood glucose before driving.  Check your blood glucose before and after exercise.  Always carry treatment with you, such as glucose pills.  Always wear a medical alert bracelet if you have diabetes. GET HELP RIGHT AWAY IF:   Your blood glucose goes below 70 mg/dl or as told by your doctor, and you:  Are confused.  Are not able to swallow.  Pass out (faint).  You cannot treat yourself. You may need someone to help you.  You have low blood sugar problems often.  You have problems from your medicines.  You are not feeling better after 3 to 4 days.  You have vision changes. MAKE SURE YOU:   Understand these instructions.  Will watch this condition.  Will get help right away if you are not doing well or get worse. Document Released: 09/10/2009 Document Revised: 09/08/2011 Document Reviewed:  09/10/2009 [facility] Patient Information 2014 [place], [place].  Transient Ischemic Attack A transient ischemic attack (TIA) is a "warning stroke" that causes stroke-like symptoms. Unlike a stroke, a TIA does not cause permanent damage to the brain. The symptoms of a TIA can happen very fast and do not last long. It is important to know the symptoms of a TIA and what to do. This can help prevent a major stroke or death. CAUSES   A TIA is caused by a temporary blockage in an artery in the brain or neck (carotid artery). The blockage does not allow the brain to get the blood supply it needs and can cause different symptoms. The blockage can be caused by either:  A blood clot.  Fatty buildup (plaque) in a neck or brain artery. RISK FACTORS  High blood pressure (hypertension).  High cholesterol.  Diabetes mellitus.  Heart disease.  The build up of plaque in the blood vessels (peripheral artery disease or atherosclerosis).  The build up of plaque in the blood vessels providing blood and oxygen to the brain (carotid artery stenosis).  An abnormal heart rhythm (atrial fibrillation).  Obesity.  Smoking.  Taking oral contraceptives (especially in combination with smoking).  Physical inactivity.  A diet high in fats, salt (sodium), and calories.  Alcohol use.  Use of illegal drugs (especially cocaine and methamphetamine).  Being female.  Being African American.  Being over the age of 60.  Family history of stroke.  Previous history of blood clots, stroke, TIA, or heart attack.  Sickle cell disease. SYMPTOMS  TIA symptoms are the same as a stroke but are temporary. These symptoms usually develop suddenly, or may be newly present upon awakening from sleep:  Sudden weakness or numbness of the face, arm, or leg, especially on one side of the body.  Sudden trouble walking or difficulty moving arms or legs.  Sudden confusion.  Sudden personality changes.  Trouble speaking  (aphasia) or understanding.  Difficulty swallowing.  Sudden trouble seeing in one or both eyes.  Double vision.  Dizziness.  Loss of balance or  coordination.  Sudden severe headache with no known cause.  Trouble reading or writing.  Loss of bowel or bladder control.  Loss of consciousness. DIAGNOSIS  Your caregiver may be able to determine the presence or absence of a TIA based on your symptoms, history, and physical exam. Computed tomography (CT scan) of the brain is usually performed to help identify a TIA. Other tests may be done to diagnose a TIA. These tests may include:  Electrocardiography.  Continuous heart monitoring.  Echocardiography.  Carotid ultrasonography.  Magnetic resonance imaging (MRI).  A scan of the brain circulation.  Blood tests. PREVENTION  The risk of a TIA can be decreased by appropriately treating high blood pressure, high cholesterol, diabetes, heart disease, and obesity and by quitting smoking, limiting alcohol, and staying physically active. TREATMENT  Time is of the essence. Since the symptoms of TIA are the same as a stroke, it is important to seek treatment within 3 4 hours of the start of symptoms because you may receive a medicine to dissolve the clot (thrombolytic) that cannot be given after that time. Treatment options vary. Treatment options may include rest, oxygen, intravenous (IV) fluids, and medicines to thin the blood (anticoagulants). Medicines and diet may be used to address diabetes, high blood pressure, and other risk factors. Measures will be taken to prevent short-term and long-term complications, including infection from breathing foreign material into the lungs (aspiration pneumonia), blood clots in the legs, and falls. Treatment options include procedures to either remove plaque in the carotid arteries or dilate carotid arteries that have narrowed due to plaque. Those procedures are:  Carotid endarterectomy.  Carotid  angioplasty and stenting. HOME CARE INSTRUCTIONS   Take all medicines prescribed by your caregiver. Follow the directions carefully. Medicines may be used to control risk factors for a stroke. Be sure you understand all your medicine instructions.  You may be told to take aspirin or the anticoagulant warfarin. Warfarin needs to be taken exactly as instructed.  Taking too much or too little warfarin is dangerous. Too much warfarin increases the risk of bleeding. Too little warfarin continues to allow the risk for blood clots. While taking warfarin, you will need to have regular blood tests to measure your blood clotting time. A PT blood test measures how long it takes for blood to clot. Your PT is used to calculate another value called an INR. Your PT and INR help your caregiver to adjust your dose of warfarin. The dose can change for many reasons. It is critically important that you take warfarin exactly as prescribed.  Many foods, especially foods high in vitamin K can interfere with warfarin and affect the PT and INR. Foods high in vitamin K include spinach, kale, broccoli, cabbage, collard and turnip greens, brussels sprouts, peas, cauliflower, seaweed, and parsley as well as beef and pork liver, green tea, and soybean oil. You should eat a consistent amount of foods high in vitamin K. Avoid major changes in your diet, or notify your caregiver before changing your diet. Arrange a visit with a dietitian to answer your questions.  Many medicines can interfere with warfarin and affect the PT and INR. You must tell your caregiver about any and all medicines you take, this includes all vitamins and supplements. Be especially cautious with aspirin and anti-inflammatory medicines. Do not take or discontinue any prescribed or over-the-counter medicine except on the advice of your caregiver or pharmacist.  Warfarin can have side effects, such as excessive bruising or bleeding. You will  need to hold pressure  over cuts for longer than usual. Your caregiver or pharmacist will discuss other potential side effects.  Avoid sports or activities that may cause injury or bleeding.  Be mindful when shaving, flossing your teeth, or handling sharp objects.  Alcohol can change the body's ability to handle warfarin. It is best to avoid alcoholic drinks or consume only very small amounts while taking warfarin. Notify your caregiver if you change your alcohol intake.  Notify your dentist or other caregivers before procedures.  Eat a diet that includes 5 or more servings of fruits and vegetables each day. This may reduce the risk of stroke. Certain diets may be prescribed to address high blood pressure, high cholesterol, diabetes, or obesity.  A low-sodium, low-saturated fat, low-trans fat, low-cholesterol diet is recommended to manage high blood pressure.  A low-saturated fat, low-trans fat, low-cholesterol, and high-fiber diet may control cholesterol levels.  A controlled-carbohydrate, controlled-sugar diet is recommended to manage diabetes.  A reduced-calorie, low-sodium, low-saturated fat, low-trans fat, low-cholesterol diet is recommended to manage obesity.  Maintain a healthy weight.  Stay physically active. It is recommended that you get at least 30 minutes of activity on most or all days.  Do not smoke.  Limit alcohol use even if you are not taking warfarin. Moderate alcohol use is considered to be:  No more than 2 drinks each day for men.  No more than 1 drink each day for nonpregnant women.  Stop drug abuse.  Home safety. A safe home environment is important to reduce the risk of falls. Your caregiver may arrange for specialists to evaluate your home. Having grab bars in the bedroom and bathroom is often important. Your caregiver may arrange for equipment to be used at home, such as raised toilets and a seat for the shower.  Follow all instructions for follow-up with your caregiver. This is  very important. This includes any referrals and lab tests. Proper follow up can prevent a stroke or another TIA from occurring. SEEK MEDICAL CARE IF:  You have personality changes.  You have difficulty swallowing.  You are seeing double.  You have dizziness.  You have a fever.  You have skin breakdown. SEEK IMMEDIATE MEDICAL CARE IF:  Any of these symptoms may represent a serious problem that is an emergency. Do not wait to see if the symptoms will go away. Get medical help right away. Call your local emergency services (911 in [place] Do not drive yourself to the hospital.  You have sudden weakness or numbness of the face, arm, or leg, especially on one side of the body.  You have sudden trouble walking or difficulty moving arms or legs.  You have sudden confusion.  You have trouble speaking (aphasia) or understanding.  You have sudden trouble seeing in one or both eyes.  You have a loss of balance or coordination.  You have a sudden, severe headache with no known cause.  You have new chest pain or an irregular heartbeat.  You have a partial or total loss of consciousness. MAKE SURE YOU:   Understand these instructions.  Will watch your condition.  Will get help right away if you are not doing well or get worse. Document Released: 03/26/2005 Document Revised: 06/02/2012 Document Reviewed: 08/09/2009 [facility] Patient Information 2014 [place]. Urinary Tract Infection A urinary tract infection (UTI) can occur any place along the urinary tract. The tract includes the kidneys, ureters, bladder, and urethra. A type of germ called bacteria often causes a  UTI. UTIs are often helped with antibiotic medicine.  HOME CARE   If given, take antibiotics as told by your doctor. Finish them even if you start to feel better.  Drink enough fluids to keep your pee (urine) clear or pale yellow.  Avoid tea, drinks with caffeine, and bubbly (carbonated) drinks.  Pee often. Avoid  holding your pee in for a long time.  Pee before and after having sex (intercourse).  Wipe from front to back after you poop (bowel movement) if you are a woman. Use each tissue only once. GET HELP RIGHT AWAY IF:   You have back pain.  You have lower belly (abdominal) pain.  You have chills.  You feel sick to your stomach (nauseous).  You throw up (vomit).  Your burning or discomfort with peeing does not go away.  You have a fever.  Your symptoms are not better in 3 days. MAKE SURE YOU:   Understand these instructions.  Will watch your condition.  Will get help right away if you are not doing well or get worse. Document Released: 12/03/2007 Document Revised: 03/10/2012 Document Reviewed: 01/15/2012 [facility] Patient Information 2014 [place], [place].

## 2013-07-23 NOTE — Progress Notes (Signed)
Stroke Team Progress Note  HISTORY Sabrina QuailCynthia [name] is an 58 y.o. female with liver disease, INR 1.98, bipolar, HTN, DM, hyperlipidemia who went to sleep last night 07/21/2013 at 10 PM feeling normal. This AM 07/21/2013 she awoke at 4 AM to go to the bathroom and noted she was drifting to the left. She went back to sleep but on waking she felt she was still drifting to left when walking. Husband states he noted her actually drifting into a wall. Patient called PCP who told her to go to ED. At time of consultation she was back to her baseline and had no complaints of weakness, imbalance, decreased sensation, dysarthria,dysphagia, diplopia. MRI brain showed no acute infarct.  Patient was not administerd TPA secondary to delay in arrival. She was admitted for further evaluation and treatment.  SUBJECTIVE No family is at the bedside.  Overall she feels her condition is completely resolved.   OBJECTIVE Most recent Vital Signs: Filed Vitals:   07/22/13 1550 07/22/13 2129 07/23/13 0018 07/23/13 0405  BP: 127/76 120/57 118/57 129/60  Pulse: 68 76 73 66  Temp: 98 F (36.7 C) 98.6 F (37 C) 97.9 F (36.6 C) 97.8 F (36.6 C)  TempSrc: Oral Oral Oral Oral  Resp: 18 20 18 18   Height:      Weight:      SpO2: 98% 97% 94% 94%   CBG (last 3)   Recent Labs  07/22/13 1710 07/22/13 2116 07/23/13 0743  GLUCAP 76 103* 82    IV Fluid Intake:     MEDICATIONS  . ARIPiprazole  10 mg Oral QHS  . aspirin  300 mg Rectal Daily   Or  . aspirin  325 mg Oral Daily  . cefTRIAXone (ROCEPHIN)  IV  1 g Intravenous Q24H  . FLUoxetine  40 mg Oral Daily  . furosemide  20 mg Oral Daily  . iron polysaccharides  150 mg Oral Daily  . loratadine  10 mg Oral Daily  . LORazepam  0.5 mg Oral BID  . mesalamine  1.2 g Oral BID  . pantoprazole  40 mg Oral Daily  . pentosan polysulfate  200 mg Oral Custom  . traZODone  25 mg Oral QHS   PRN:  acetaminophen, acetaminophen  Diet:  Cardiac thin liquids Activity:  OOB  with assistance DVT Prophylaxis:  Scds ordered  CLINICALLY SIGNIFICANT STUDIES Basic Metabolic Panel:   Recent Labs Lab 07/22/13 0453 07/23/13 0509  NA 143 140  K 3.5* 4.1  CL 110 107  CO2 24 23  GLUCOSE 107* 82  BUN 7 6  CREATININE 0.51 0.48*  CALCIUM 8.0* 8.3*   Liver Function Tests:   Recent Labs Lab 07/21/13 1055  AST 63*  ALT 31  ALKPHOS 130*  BILITOT 1.6*  PROT 8.4*  ALBUMIN 2.6*   CBC:   Recent Labs Lab 07/21/13 1055 07/21/13 1104 07/22/13 0453  WBC 6.9  --  7.1  NEUTROABS 4.0  --   --   HGB 12.4 15.3* 10.8*  HCT 35.4* 45.0 30.8*  MCV 89.6  --  89.0  PLT 248  --  236   Coagulation:   Recent Labs Lab 07/21/13 1055 07/22/13 0453  LABPROT 21.9* 23.6*  INR 1.98* 2.18*   Cardiac Enzymes:   Recent Labs Lab 07/21/13 1055  TROPONINI <0.30   Urinalysis:   Recent Labs Lab 07/21/13 1140  COLORURINE YELLOW  LABSPEC 1.009  PHURINE 6.0  GLUCOSEU NEGATIVE  HGBUR NEGATIVE  BILIRUBINUR NEGATIVE  KETONESUR NEGATIVE  PROTEINUR NEGATIVE  UROBILINOGEN 0.2  NITRITE NEGATIVE  LEUKOCYTESUR LARGE*   Lipid Panel    Component Value Date/Time   CHOL 142 07/22/2013 0453   TRIG 67 07/22/2013 0453   HDL 20* 07/22/2013 0453   CHOLHDL 7.1 07/22/2013 0453   VLDL 13 07/22/2013 0453   LDLCALC 109* 07/22/2013 0453   HgbA1C  Lab Results  Component Value Date   HGBA1C 5.5 07/22/2013    Urine Drug Screen:     Component Value Date/Time   LABOPIA NONE DETECTED 07/21/2013 1140   COCAINSCRNUR NONE DETECTED 07/21/2013 1140   LABBENZ NONE DETECTED 07/21/2013 1140   AMPHETMU NONE DETECTED 07/21/2013 1140   THCU NONE DETECTED 07/21/2013 1140   LABBARB NONE DETECTED 07/21/2013 1140    Alcohol Level:   Recent Labs Lab 07/21/13 1055  ETH <11    CT of the brain  07/21/2013    Negative    MRI of the brain  07/21/2013    1. No evidence of acute infarct. 2. T1 hyperintensity in the globi pallidi. This is most commonly seen in the setting of liver dysfunction.     MRA of the brain  07/21/2013   Unremarkable head MRA.  2D Echocardiogram  ejection fraction 55%. No cardiac source of emboli identified.  Carotid Doppler  no significant carotid artery stenosis bilaterally on preliminary report.  CXR  07/22/2013    No acute cardiopulmonary process seen; borderline cardiomegaly.    EKG  normal sinus rhythm. For complete results please see formal report.   Therapy Recommendations therapy evaluations complete. No therapy needs identified.  Physical Exam  General: The patient is alert and cooperative at the time of the examination.  Skin: No significant peripheral edema is noted.   Neurologic Exam  Mental status: The patient is oriented x 3.  Cranial nerves: Facial symmetry is present. Speech is normal, no aphasia or dysarthria is noted. Extraocular movements are full. Visual fields are full.  Motor: The patient has good strength in all 4 extremities.  Sensory examination: Soft touch sensation is symmetric on the face, arms, and legs.  Coordination: The patient has good finger-nose-finger and heel-to-shin bilaterally. No asterixis is seen.  Gait and station: Gait normal  Reflexes: Deep tendon reflexes are symmetric.    ASSESSMENT Ms. Sabrina [name] is a 58 y.o. female presenting with drifting to the left that resolved. Imaging confirms no acute infarct. Dx:  Right brain TIA. On no antithrombotics prior to admission. Now on aspirin 325 mg orally every day for secondary stroke prevention. Patient with no resultant neuro symptoms. Work up underway.  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 (81 mg  instead of 325 mg secondary to liver disease)  Likely no therapy needs  Followup Dr. Pearlean Brownie in 2 months  Delton See PA-C [facility] Pager [phone] 07/23/2013, 10:29 AM  I have personally obtained a history, examined the patient, evaluated imaging results, and formulated the assessment and plan of care. I agree with the above. Delia Heady, MD

## 2013-07-23 NOTE — Discharge Summary (Addendum)
Physician Discharge Summary  Rita Vialpando YYQ:[id number] DOB: 10/08/1955 DOA: 07/21/2013  PCP: Annye Asa, MD  Admit date: 07/21/2013 Discharge date: 07/23/2013  Recommendations for Outpatient Follow-up:  1. Please follow up urine culture and repeat urinalysis for test of cure  2. Consider starting statin therapy as LDL is >100.   Discharge Diagnoses:  Active Problems:   CROHN'S DISEASE-LARGE INTESTINE   BIPOLAR AFFECTIVE DISORDER   Cirrhosis of liver   TIA (transient ischemic attack)   Coagulopathy   Diabetes mellitus type 2, noninsulin dependent   Hypoglycemia   Undiagnosed cardiac murmurs  Discharge Condition: stable   Diet recommendation: low sodium, carb modified  Filed Weights   07/21/13 1017 07/21/13 2027  Weight: 147 lb (66.679 kg) 151 lb 7.3 oz (68.7 kg)    History of present illness:  58 year old female with a history of Crohn's disease, diabetes mellitus, bipolar disorder, liver cirrhosis with coagulopathy presents with left leg weakness that began when she woke up on the morning of admission around 4 AM. The patient felt like she was leaning to her left side and felt that her left leg was weak. She went back to bed. She woke up again around 6 AM without any improvement in her left leg weakness. She continued to lean to the left. She wants to take a shower and lost her balance and suffered a mechanical fall. The patient denies any syncope. She denies any fevers, chills, chest discomfort, dizziness, shortness of breath, blurry vision, dysarthria, upper extremity weakness, diplopia. Her deficits have returned back to baseline immediately. The patient was seen by neurology in [facility] ED who recommened transfer to [facility] for further care.   In the ED, CBC was unremarkable. BMP was unremarkable. Hepatic enzymes showed mild elevation of AST 63, ALT 31, total bilirubin 1.6, alkaline phosphatase 130. CT of the brain was negative. MRI brain was negative for acute infarct but showed T1  hyperintensity of the oldest pelvis consistent with liver dysfunction. Urinalysis showed too numerous to count WBCs. Urine drug screen was negative.  Hospital Course:  Ms. Baylyn Sickles is a 58 y.o. female presenting with drifting to the left that resolved. Imaging confirms no acute infarct. Dx: Right brain TIA. On no antithrombotics prior to admission. Now on aspirin 81 mg orally every day for secondary stroke prevention. Patient with no resultant neuro symptoms. Work up underway.  hypertension  Hyperlipidemia, LDL 109, on no statin PTA, now on no statin, goal LDL < 100 (< 70 for diabetics)  Diabetes, HgbA1c 5.4, at goal < 7.0  Macular degeneration  Bipolar disorder  Pyuria, on rocephin, cx pending  Coagulopathy, INR 2.0, secondary to liver cirrhosis  Heart murmur  TREATMENT/PLAN PER NEUROLOGY SERVICE  Continue aspirin 81 mg orally every day for secondary stroke prevention.   Left leg weakness/TIA  -Neurology is following and recommended transfer to [facility]  -MRI discussed above--negative for acute ischemic stroke  -Continue aspirin  -MRA of brain negative  -Carotid Doppler negative for significant findings -TSH, hemoglobin A1c, lipid panel reviewed, LDL slightly elevated   E. 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 Henrene Pastor recommended  Coagulopathy  -INR 2, no active bleeding  -Secondary to liver  cirrhosis--etiology NASH vs autoimmune  -follows Dr. Henrene Pastor (GI) closely outpatient  -No active bleeding presently  -Hemoglobin is being followed and remained stable  Diabetes mellitus type 2, controlled  -Hold Januvia  -NovoLog sliding scale  -05/05/2013  hemoglobin A1c 5.4   Heart Murmur  - 2D Echocardiogram reviewed Impressions: - Normal LV size and systolic function, EF 78%. Normal RV size and systolic function. Biatrial enlargement. Mild aortic stenosis. Mild pulmonary hypertension. - Outpatient follow up with Dr. Johnsie Cancel   Hypoglycemia  - holding oral meds  - resolved after patient started eating better  - continue to monitor closely   Crohn's disease  -Continue mesalamine   Bipolar disorder  -Continue Prozac, Abilify, trazodone  Consultations:  Neurology   Procedures:  Echocardiogram Impressions: - Normal LV size and systolic function, EF 58%. Normal RV size and systolic function. Biatrial enlargement. Mild aortic stenosis. Mild pulmonary hypertension.  Discharge Exam: Pt is without complaints, no recurrence of symptoms Filed Vitals:   07/23/13 0405  BP: 129/60  Pulse: 66  Temp: 97.8 F (36.6 C)  Resp: 18   General: awake, alert, no distress Cardiovascular: normal s1, s2 sounds  Respiratory: BBS clear to auscultation Abd: soft, nondistended, nontender, no masses Ext: no cyanosis Neuro: nonfocal   Discharge Instructions  Discharge Orders   Future Appointments Provider Department Dept Phone   08/10/2013 9:30 AM [facility] [place] [facility] E[facility] LAB [phone]   08/10/2013 10:45 AM Josue Hector, MD [place] [facility] [phone]   09/05/2013 9:15 AM Irene Shipper, MD [facility] [phone]   Future Orders Complete By Expires   Diet - low sodium heart healthy  As directed    Discharge instructions  As directed    Comments:     Return if symptoms recur, worsen or new problems develop.   Discontinue IV  As directed    Increase activity slowly  As directed        Medication List         acetaminophen 500 MG tablet  Commonly known as:  TYLENOL  Take 1,000 mg by mouth every 6 (six) hours as needed.     ARIPiprazole 10 MG tablet   Commonly known as:  ABILIFY  Take 10 mg by mouth at bedtime.     aspirin 81 MG tablet  Take 1 tablet (81 mg total) by mouth daily.     cephALEXin 500 MG capsule  Commonly known as:  KEFLEX  Take 1 capsule (500 mg total) by mouth 2 (two) times daily.     fexofenadine 180 MG tablet  Commonly known as:  ALLEGRA  Take 180 mg by mouth every morning.     FLUoxetine 40 MG capsule  Commonly known as:  PROZAC  Take 40 mg by mouth every morning.     furosemide 20 MG tablet  Commonly known as:  LASIX  Take 1 tablet (20 mg total) by mouth daily.     LORazepam 0.5 MG tablet  Commonly known as:  ATIVAN  Take 0.5 mg by mouth 2 (two) times daily.     mesalamine 1.2 G EC tablet  Commonly known as:  LIALDA  Take 1.2 g by mouth 2 (two) times daily.     omeprazole 40 MG capsule  Commonly known as:  PRILOSEC  Take 1 capsule (40 mg total) by mouth 2 (two) times daily.     pentosan polysulfate 100 MG capsule  Commonly known as:  ELMIRON  Take 200 mg by mouth 2 (two) times daily.     POLY-IRON 150 PO  Take 1 tablet by mouth 2 (two) times daily.     sitaGLIPtin 100 MG tablet  Commonly known as:  JANUVIA  Take 100 mg by mouth daily.     traZODone 50 MG tablet  Commonly known as:  DESYREL  Take 25 mg by mouth at bedtime.       Allergies  Allergen Reactions  . Sulfonamide Derivatives     Classic angioedema reaction  . Wheat Bran     Celiac's disease       Follow-up Information   Follow up with Annye Asa, MD. Schedule an appointment as soon as possible for a visit in 1 week. [facility] Followup )    Specialty:  Family Medicine   Contact information:   [phone] [place] [address] [phone]       Follow up with Scarlette Shorts, MD. Schedule an appointment as soon as possible for a visit in 2 months. (as scheduled )    Specialty:  Gastroenterology   Contact information:   [address] [place] [address] [phone]          Follow up with Dr.  Leonie Man in 2 months  The results of significant diagnostics from this hospitalization (including imaging, microbiology, ancillary and laboratory) are listed below for reference.    Significant Diagnostic Studies: Dg Chest 2 View  07/22/2013   CLINICAL DATA:  CVA.  EXAM: CHEST  2 VIEW  COMPARISON:  Chest radiograph performed 09/20/2012  FINDINGS: The lungs are well-aerated and clear. There is no evidence of focal opacification, pleural effusion or pneumothorax.  The heart is borderline enlarged. No acute osseous abnormalities are seen. Scattered clips are noted at the upper abdomen.  IMPRESSION: No acute cardiopulmonary process seen; borderline cardiomegaly.   Electronically Signed   By: Garald Balding M.D.   On: 07/22/2013 02:12   Ct Head Wo Contrast  07/21/2013   CLINICAL DATA:  Left-sided weakness  EXAM: CT HEAD WITHOUT CONTRAST  TECHNIQUE: Contiguous axial images were obtained from the base of the skull through the vertex without intravenous contrast.  COMPARISON:  None.  FINDINGS: Ventricle size is normal. Negative for acute infarct. Negative for hemorrhage or mass. Calvarium intact.  IMPRESSION: Negative   Electronically Signed   By: Franchot Gallo M.D.   On: 07/21/2013 11:18   Mr Jodene Nam Head Wo Contrast  07/21/2013   CLINICAL DATA:  Left-sided weakness.  Possible stroke or TIA.  EXAM: MRA HEAD WITHOUT CONTRAST  TECHNIQUE: Angiographic images of the Circle of Willis were obtained using MRA technique without intravenous contrast.  COMPARISON:  None.  FINDINGS: The visualized distal vertebral arteries are patent. The left vertebral artery is hypoplastic distal to the PICA origin. PICA origins visualized bilaterally and are patent. The basilar artery is patent without stenosis. Right AICA is dominant. SCAs appear patent. PCAs are unremarkable. A prominent right posterior communicating artery is present.  Internal carotid arteries are patent from skullbase to carotid termini. ACAs and MCAs are unremarkable.  No intracranial aneurysm is identified.  IMPRESSION: Unremarkable head MRA.   Electronically Signed   By: Logan Bores   On: 07/21/2013 17:12   Mr Brain Wo Contrast  07/21/2013   CLINICAL DATA:  Acute onset of left-sided weakness. Stroke versus TIA.  EXAM: MRI HEAD WITHOUT CONTRAST  TECHNIQUE: Multiplanar, multiecho pulse sequences of the brain and surrounding structures were obtained without  intravenous contrast.  COMPARISON:  Head CT 07/21/2013 and brain MRI 03/03/2009  FINDINGS: There is abnormal T1 hyperintensity involving the globi pallidi bilaterally extending into the cerebral peduncles. There is mild cerebral atrophy. There is no evidence of acute infarct, intracranial hemorrhage, mass, midline shift, or extra-axial fluid collection. Orbits are normal. Major intracranial vascular flow voids are unremarkable. Visualized paranasal sinuses and mastoid air cells are clear.  IMPRESSION: 1. No evidence of acute infarct. 2. T1 hyperintensity in the globi pallidi. This is most commonly seen in the setting of liver dysfunction.   Electronically Signed   By: Logan Bores   On: 07/21/2013 13:42    Microbiology: Recent Results (from the past 240 hour(s))  URINE CULTURE     Status: None   Collection Time    07/21/13 11:40 AM      Result Value Range Status   Specimen Description URINE, RANDOM   Final   Special Requests NONE   Final   Culture  Setup Time     Final   Value: 07/21/2013 15:26     Performed at [place]     Final   Value: >=100,000 COLONIES/ML     Performed at [facility]   Culture     Final   Value: ESCHERICHIA COLI     Performed at [facility]   Report Status PENDING   Incomplete     Labs: Basic Metabolic Panel:  Recent Labs Lab 07/21/13 1055 07/21/13 1104 07/22/13 0453 07/23/13 0509  NA 137 142 143 140  K 3.8 3.8 3.5* 4.1  CL 104 105 110 107  CO2 25  --  24 23  GLUCOSE 234* 216* 107* 82  BUN 6 4* 7 6  CREATININE 0.59 0.70  0.51 0.48*  CALCIUM 8.3*  --  8.0* 8.3*   Liver Function Tests:  Recent Labs Lab 07/21/13 1055  AST 63*  ALT 31  ALKPHOS 130*  BILITOT 1.6*  PROT 8.4*  ALBUMIN 2.6*   No results found for this basename: LIPASE, AMYLASE,  in the last 168 hours No results found for this basename: AMMONIA,  in the last 168 hours CBC:  Recent Labs Lab 07/21/13 1055 07/21/13 1104 07/22/13 0453  WBC 6.9  --  7.1  NEUTROABS 4.0  --   --   HGB 12.4 15.3* 10.8*  HCT 35.4* 45.0 30.8*  MCV 89.6  --  89.0  PLT 248  --  236   Cardiac Enzymes:  Recent Labs Lab 07/21/13 1055  TROPONINI <0.30   BNP: BNP (last 3 results) No results found for this basename: PROBNP,  in the last 8760 hours CBG:  Recent Labs Lab 07/21/13 2206 07/22/13 0755 07/22/13 1201 07/22/13 1710 07/22/13 2116  GLUCAP 78 83 83 76 103*   Signed:  Airelle Everding  [facility] 07/23/2013, 8:23 AM

## 2013-07-28 ENCOUNTER — Ambulatory Visit (INDEPENDENT_AMBULATORY_CARE_PROVIDER_SITE_OTHER): Payer: 59 | Admitting: Family Medicine

## 2013-07-28 ENCOUNTER — Encounter: Payer: Self-pay | Admitting: Family Medicine

## 2013-07-28 VITALS — BP 128/72 | HR 68 | Temp 97.7°F | Resp 16 | Wt 149.0 lb

## 2013-07-28 DIAGNOSIS — E785 Hyperlipidemia, unspecified: Secondary | ICD-10-CM

## 2013-07-28 DIAGNOSIS — N390 Urinary tract infection, site not specified: Secondary | ICD-10-CM

## 2013-07-28 DIAGNOSIS — G459 Transient cerebral ischemic attack, unspecified: Secondary | ICD-10-CM

## 2013-07-28 LAB — POCT URINALYSIS DIPSTICK
BILIRUBIN UA: NEGATIVE
GLUCOSE UA: NEGATIVE
KETONES UA: NEGATIVE
Leukocytes, UA: NEGATIVE
Nitrite, UA: NEGATIVE
Protein, UA: NEGATIVE
RBC UA: NEGATIVE
Urobilinogen, UA: 0.2
pH, UA: 5

## 2013-07-28 NOTE — Assessment & Plan Note (Signed)
Pt's LDL is 109.  Discussed starting statin but due to pt's known fatty liver will hold at this time.

## 2013-07-28 NOTE — Assessment & Plan Note (Signed)
New to provider.  Reviewed hospital DC summary.  Pt has f/u scheduled w/ cards and neuro.  Currently asymptomatic.  Discussed starting statin but due to LDL of 109 and known fatty liver disease will hold on this for now.

## 2013-07-28 NOTE — Patient Instructions (Signed)
Schedule your complete physical in 6 months We'll hold on the cholesterol med at this time b/c of the liver issue Your UTI is gone! Call with any questions or concerns Hang in there!

## 2013-07-28 NOTE — Progress Notes (Signed)
   Subjective:    Patient ID: Sabrina [name], female    DOB: October 09, 1955, 58 y.o.   MRN: [id number]  HPI Hospital f/u- pt woke and was 'leaning to the L and I couldn't get straight'.  Speech was not effected, vision was normal, no numbness/tingling/weakness.  Went to ER and was admitted for TIA.  Had normal MRI, normal carotid dopplers and unremarkable ECHO w/ exception of mild AS.  Has Rocephin and Keflex.  Cx shows abx are appropriate.  Pt reports feeling well today.  No current weakness, numbness, fatigue, HAs, dizziness.   Review of Systems For ROS see HPI     Objective:   Physical Exam  Vitals reviewed. Constitutional: She is oriented to person, place, and time. She appears well-developed and well-nourished. No distress.  HENT:  Head: Normocephalic and atraumatic.  Eyes: Conjunctivae and EOM are normal. Pupils are equal, round, and reactive to light.  Neck: Normal range of motion. Neck supple. No thyromegaly present.  Cardiovascular: Normal rate, regular rhythm and intact distal pulses.   Murmur (II-III/VI SEM heard best at RUSB) heard. Pulmonary/Chest: Effort normal and breath sounds normal. No respiratory distress.  Abdominal: Soft. She exhibits no distension. There is no tenderness.  Musculoskeletal: She exhibits no edema.  Lymphadenopathy:    She has no cervical adenopathy.  Neurological: She is alert and oriented to person, place, and time.  Skin: Skin is warm and dry.  Psychiatric: She has a normal mood and affect. Her behavior is normal.          Assessment & Plan:

## 2013-07-28 NOTE — Assessment & Plan Note (Signed)
Pt was treated appropriately w/ Keflex.  UA unremarkable today.

## 2013-07-28 NOTE — Progress Notes (Signed)
Pre-visit discussion using our clinic review tool, as applicable. No additional management support is needed unless otherwise documented below in the visit note.  

## 2013-08-01 ENCOUNTER — Telehealth: Payer: Self-pay

## 2013-08-01 NOTE — Telephone Encounter (Signed)
This is fine. Repeat BMET in 4 weeks. Thanks ----- Message ----- From: Lily LovingsLinda Ray Roseann Kees, RN Sent: 08/01/2013 8:44 AM To: Hilarie FredricksonJohn N Perry, MD Dr. Marina GoodellPerry this pt was supposed to have a repeated Bmet the 1st of Feb based on December lab. Is this one ok or do you want her to have another BMET?

## 2013-08-08 ENCOUNTER — Encounter: Payer: Self-pay | Admitting: Family Medicine

## 2013-08-09 ENCOUNTER — Other Ambulatory Visit: Payer: Self-pay | Admitting: Family Medicine

## 2013-08-09 DIAGNOSIS — E119 Type 2 diabetes mellitus without complications: Secondary | ICD-10-CM

## 2013-08-10 ENCOUNTER — Encounter: Payer: Self-pay | Admitting: Cardiovascular Disease

## 2013-08-10 ENCOUNTER — Ambulatory Visit (INDEPENDENT_AMBULATORY_CARE_PROVIDER_SITE_OTHER): Payer: 59 | Admitting: Cardiovascular Disease

## 2013-08-10 ENCOUNTER — Other Ambulatory Visit (INDEPENDENT_AMBULATORY_CARE_PROVIDER_SITE_OTHER): Payer: 59

## 2013-08-10 ENCOUNTER — Other Ambulatory Visit (HOSPITAL_COMMUNITY): Payer: 59

## 2013-08-10 VITALS — BP 121/68 | HR 66 | Wt 149.0 lb

## 2013-08-10 DIAGNOSIS — I350 Nonrheumatic aortic (valve) stenosis: Secondary | ICD-10-CM

## 2013-08-10 DIAGNOSIS — E785 Hyperlipidemia, unspecified: Secondary | ICD-10-CM

## 2013-08-10 DIAGNOSIS — E119 Type 2 diabetes mellitus without complications: Secondary | ICD-10-CM

## 2013-08-10 DIAGNOSIS — I359 Nonrheumatic aortic valve disorder, unspecified: Secondary | ICD-10-CM

## 2013-08-10 DIAGNOSIS — K7460 Unspecified cirrhosis of liver: Secondary | ICD-10-CM

## 2013-08-10 DIAGNOSIS — I10 Essential (primary) hypertension: Secondary | ICD-10-CM

## 2013-08-10 LAB — BASIC METABOLIC PANEL
BUN: 6 mg/dL (ref 6–23)
CHLORIDE: 108 meq/L (ref 96–112)
CO2: 24 mEq/L (ref 19–32)
Calcium: 8.4 mg/dL (ref 8.4–10.5)
Creatinine, Ser: 0.6 mg/dL (ref 0.4–1.2)
GFR: 108.71 mL/min (ref >60.00)
GLUCOSE: 104 mg/dL — AB (ref 70–99)
POTASSIUM: 3.9 meq/L (ref 3.5–5.1)
Sodium: 138 mEq/L (ref 135–145)

## 2013-08-10 NOTE — Patient Instructions (Signed)
Your physician wants you to follow-up in:   YEAR WITH  DR Eden EmmsNISHAN   AND  ECHO SAME DAY You will receive a reminder letter in the mail two months in advance. If you don't receive a letter, please call our office to schedule the follow-up appointment. Your physician recommends that you continue on your current medications as directed. Please refer to the Current Medication list given to you today. Your physician has requested that you have an echocardiogram. Echocardiography is a painless test that uses sound waves to create images of your heart. It provides your doctor with information about the size and shape of your heart and how well your heart's chambers and valves are working. This procedure takes approximately one hour. There are no restrictions for this procedure. YEAR

## 2013-08-10 NOTE — Assessment & Plan Note (Signed)
Cholesterol is at goal.  Continue current dose of statin and diet Rx.  No myalgias or side effects.  F/U  LFT's in 6 months. Lab Results  Component Value Date   LDLCALC 109* 07/22/2013

## 2013-08-10 NOTE — Assessment & Plan Note (Signed)
Well controlled.  Continue current medications and low sodium Dash type diet.    

## 2013-08-10 NOTE — Assessment & Plan Note (Signed)
Discussed low carb diet.  Target hemoglobin A1c is 6.5 or less.  Continue current medications. Will send note to Dr Beverely Lowabori ? ACE for renal protection and decrease lasix if started

## 2013-08-10 NOTE — Assessment & Plan Note (Signed)
No change in murmur S2 preserved mild by echo f/u echo in a year No need for SBE

## 2013-08-10 NOTE — Progress Notes (Signed)
Patient ID: Sabrina [name], female   DOB: 08/12/1955, 58 y.o.   MRN: [id number] 58 yo with celiac disesse, Chrones and anemia Referred for murmur Echo done 07/22/13 reviewed    Study Conclusions  - Left ventricle: The cavity size was normal. Wall thickness was normal. The estimated ejection fraction was 55%. Indeterminant diastolic function. Wall motion was normal; there were no regional wall motion abnormalities. - Aortic valve: Trileaflet; moderately calcified leaflets. There was mild stenosis. Trivial regurgitation. Mean gradient: 14mm Hg (S). Peak gradient: 24mm Hg (S). Valve area: 1.54cm^2(VTI). - Mitral valve: Mildly calcified annulus. Trivial regurgitation. - Left atrium: The atrium was moderately dilated. - Right ventricle: The cavity size was normal. Systolic function was normal. - Right atrium: The atrium was mildly to moderately dilated. - Tricuspid valve: Peak RV-RA gradient: 25mm Hg (S). - Pulmonary arteries: PA peak pressure: 40mm Hg (S). - Systemic veins: IVC measured 2.3 cm with < 50% respirophasic variation, suggesting RA pressure 15 mmHg. Impressions:  - Normal LV size and systolic function, EF 55%. Normal RV size and systolic function. Biatrial enlargement. Mild aortic stenosis. Mild pulmonary hypertension.  Carotids reviewed from 07/22/13 1-39% bilateral disease  Dibetic not on ACE  A1c 5.4 2014 LDL 2014  109    ROS: Denies fever, malais, weight loss, blurry vision, decreased visual acuity, cough, sputum, SOB, hemoptysis, pleuritic pain, palpitaitons, heartburn, abdominal pain, melena, lower extremity edema, claudication, or rash.  All other systems reviewed and negative  General: Affect appropriate Healthy:  appears stated age HEENT: normal Neck supple with no adenopathy JVP normal no bruits no thyromegaly Lungs clear with no wheezing and good diaphragmatic motion Heart:  S1/S2 AS murmur, no rub, gallop or click PMI normal Abdomen: benighn, BS  positve, no tenderness, no AAA no bruit.  No HSM or HJR Distal pulses intact with no bruits No edema Neuro non-focal Skin warm and dry No muscular weakness   Current Outpatient Prescriptions  Medication Sig Dispense Refill  . acetaminophen (TYLENOL) 500 MG tablet Take 1,000 mg by mouth every 6 (six) hours as needed.      . ARIPiprazole (ABILIFY) 10 MG tablet Take 10 mg by mouth at bedtime.       Marland Kitchen aspirin 81 MG tablet Take 1 tablet (81 mg total) by mouth daily.      . fexofenadine (ALLEGRA) 180 MG tablet Take 180 mg by mouth every morning.       Marland Kitchen FLUoxetine (PROZAC) 40 MG capsule Take 40 mg by mouth every morning.       . furosemide (LASIX) 20 MG tablet Take 1 tablet (20 mg total) by mouth daily.  30 tablet  3  . LORazepam (ATIVAN) 0.5 MG tablet Take 0.5 mg by mouth 2 (two) times daily.       . mesalamine (LIALDA) 1.2 G EC tablet Take 1.2 g by mouth 2 (two) times daily.      Marland Kitchen omeprazole (PRILOSEC) 40 MG capsule Take 1 capsule (40 mg total) by mouth 2 (two) times daily.  60 capsule  6  . pentosan polysulfate (ELMIRON) 100 MG capsule Take 200 mg by mouth 2 (two) times daily.       . Polysaccharide Iron Complex (POLY-IRON 150 PO) Take 1 tablet by mouth 2 (two) times daily.      . sitaGLIPtin (JANUVIA) 100 MG tablet Take 100 mg by mouth daily.      . traZODone (DESYREL) 50 MG tablet Take 25 mg by mouth at bedtime.  No current facility-administered medications for this visit.    Allergies  Sulfonamide derivatives and Wheat bran  Electrocardiogram: SR rate 68 nonspecific ST/T wave change s  Assessment and Plan

## 2013-08-12 ENCOUNTER — Other Ambulatory Visit: Payer: Self-pay | Admitting: Internal Medicine

## 2013-08-12 ENCOUNTER — Ambulatory Visit: Payer: 59 | Admitting: Family Medicine

## 2013-08-18 LAB — HM DIABETES EYE EXAM

## 2013-08-19 ENCOUNTER — Other Ambulatory Visit: Payer: Self-pay | Admitting: Internal Medicine

## 2013-08-19 ENCOUNTER — Telehealth: Payer: Self-pay | Admitting: Internal Medicine

## 2013-08-19 MED ORDER — MESALAMINE 1.2 G PO TBEC: 1.2000 g | DELAYED_RELEASE_TABLET | Freq: Two times a day (BID) | ORAL | Status: DC

## 2013-08-19 NOTE — Telephone Encounter (Signed)
Rx sent. Patient should keep appointment with Dr Marina GoodellPerry in March 2015 for further refills.

## 2013-08-22 ENCOUNTER — Ambulatory Visit (INDEPENDENT_AMBULATORY_CARE_PROVIDER_SITE_OTHER): Payer: 59 | Admitting: Family Medicine

## 2013-08-22 ENCOUNTER — Encounter: Payer: Self-pay | Admitting: Family Medicine

## 2013-08-22 VITALS — BP 130/88 | HR 64 | Temp 98.2°F | Resp 16 | Wt 153.5 lb

## 2013-08-22 DIAGNOSIS — F319 Bipolar disorder, unspecified: Secondary | ICD-10-CM

## 2013-08-22 DIAGNOSIS — D509 Iron deficiency anemia, unspecified: Secondary | ICD-10-CM

## 2013-08-22 DIAGNOSIS — R5381 Other malaise: Secondary | ICD-10-CM

## 2013-08-22 DIAGNOSIS — R5383 Other fatigue: Secondary | ICD-10-CM

## 2013-08-22 NOTE — Patient Instructions (Signed)
Follow up as needed We'll notify you of your lab results and make any changes if needed Regardless of what the labs say, it would be a good idea to call your therapist and psychiatrist to get some help through this rough patch You are amazing.  You are strong.  You are human- that means you're allowed to fall apart at times. Call me if you need me! Hang in there!

## 2013-08-22 NOTE — Assessment & Plan Note (Signed)
Deteriorated.  Encouraged pt to call psych for possible med adjustment.  Pt needs counseling.  She is in agreement.  Will follow.

## 2013-08-22 NOTE — Assessment & Plan Note (Signed)
New.  Suspect this is due to deteriorating bipolar depression due to ongoing medical issues but need to r/o recurrent anemia, hypothyroid, or other metabolic abnormality.  Will follow closely.

## 2013-08-22 NOTE — Progress Notes (Signed)
Pre visit review using our clinic review tool, if applicable. No additional management support is needed unless otherwise documented below in the visit note. 

## 2013-08-22 NOTE — Assessment & Plan Note (Signed)
Pt has hx of this and has required transfusion in the past.  At last check, again had anemia.  Will check labs to make sure levels are not dropping.  Will treat prn.

## 2013-08-22 NOTE — Progress Notes (Signed)
   Subjective:    Patient ID: Sabrina [name], female    DOB: 08/12/1955, 58 y.o.   MRN: [id number]  HPI 'i don't know whether it's my depression or what'- has been given final warning at work.  Unable to concentrate.  Decreased productivity.  Late to work.  sxs started ~1 month ago.  Pt is currently off diabetes meds due to low readings and excellent A1C.  BP is adequately controlled.  Hx of anemia.  No hx of thyroid problems.  'i feel lousy'.  Overwhelming fatigue.    Review of Systems For ROS see HPI     Objective:   Physical Exam  Constitutional: She is oriented to person, place, and time. She appears well-developed and well-nourished. No distress.  HENT:  Head: Normocephalic and atraumatic.  Eyes: Conjunctivae and EOM are normal. Pupils are equal, round, and reactive to light.  Neck: Normal range of motion. Neck supple. No thyromegaly present.  Cardiovascular: Normal rate, regular rhythm and intact distal pulses.   Murmur (II-III/VI SEM at USB) heard. Pulmonary/Chest: Effort normal and breath sounds normal. No respiratory distress.  Abdominal: Soft. She exhibits no distension. There is no tenderness.  Musculoskeletal: She exhibits no edema.  Lymphadenopathy:    She has no cervical adenopathy.  Neurological: She is alert and oriented to person, place, and time.  Skin: Skin is warm and dry.  Psychiatric: Her behavior is normal.  Tearful, withdrawn          Assessment & Plan:

## 2013-08-23 ENCOUNTER — Encounter: Payer: Self-pay | Admitting: General Practice

## 2013-08-23 LAB — CBC WITH DIFFERENTIAL/PLATELET
BASOS ABS: 0 10*3/uL (ref 0.0–0.1)
Basophils Relative: 0.2 % (ref 0.0–3.0)
EOS PCT: 4.2 % (ref 0.0–5.0)
Eosinophils Absolute: 0.4 10*3/uL (ref 0.0–0.7)
HEMATOCRIT: 37.4 % (ref 36.0–46.0)
Hemoglobin: 12 g/dL (ref 12.0–15.0)
LYMPHS ABS: 4.4 10*3/uL — AB (ref 0.7–4.0)
Lymphocytes Relative: 42.4 % (ref 12.0–46.0)
MCHC: 32.1 g/dL (ref 30.0–36.0)
MCV: 98.1 fl (ref 78.0–100.0)
MONOS PCT: 9.2 % (ref 3.0–12.0)
Monocytes Absolute: 1 10*3/uL (ref 0.1–1.0)
NEUTROS ABS: 4.6 10*3/uL (ref 1.4–7.7)
Neutrophils Relative %: 44 % (ref 43.0–77.0)
Platelets: 230 10*3/uL (ref 150.0–400.0)
RBC: 3.81 Mil/uL — ABNORMAL LOW (ref 3.87–5.11)
RDW: 16.1 % — AB (ref 11.5–14.6)
WBC: 10.4 10*3/uL (ref 4.5–10.5)

## 2013-08-23 LAB — BASIC METABOLIC PANEL
BUN: 5 mg/dL — AB (ref 6–23)
CHLORIDE: 105 meq/L (ref 96–112)
CO2: 28 meq/L (ref 19–32)
Calcium: 8.5 mg/dL (ref 8.4–10.5)
Creatinine, Ser: 0.5 mg/dL (ref 0.4–1.2)
GFR: 128.21 mL/min (ref >60.00)
Glucose, Bld: 89 mg/dL (ref 70–99)
Potassium: 3.4 mEq/L — ABNORMAL LOW (ref 3.5–5.1)
Sodium: 136 mEq/L (ref 135–145)

## 2013-08-23 LAB — TSH: TSH: 2.38 u[IU]/mL (ref 0.35–5.50)

## 2013-08-30 ENCOUNTER — Encounter: Payer: Self-pay | Admitting: *Deleted

## 2013-09-05 ENCOUNTER — Encounter: Payer: Self-pay | Admitting: Internal Medicine

## 2013-09-05 ENCOUNTER — Other Ambulatory Visit (INDEPENDENT_AMBULATORY_CARE_PROVIDER_SITE_OTHER): Payer: 59

## 2013-09-05 ENCOUNTER — Ambulatory Visit (INDEPENDENT_AMBULATORY_CARE_PROVIDER_SITE_OTHER): Payer: 59 | Admitting: Internal Medicine

## 2013-09-05 VITALS — BP 108/62 | HR 72 | Ht 64.0 in | Wt 153.5 lb

## 2013-09-05 DIAGNOSIS — K7460 Unspecified cirrhosis of liver: Secondary | ICD-10-CM

## 2013-09-05 DIAGNOSIS — D509 Iron deficiency anemia, unspecified: Secondary | ICD-10-CM

## 2013-09-05 DIAGNOSIS — K5090 Crohn's disease, unspecified, without complications: Secondary | ICD-10-CM

## 2013-09-05 DIAGNOSIS — K259 Gastric ulcer, unspecified as acute or chronic, without hemorrhage or perforation: Secondary | ICD-10-CM

## 2013-09-05 DIAGNOSIS — K900 Celiac disease: Secondary | ICD-10-CM

## 2013-09-05 DIAGNOSIS — K253 Acute gastric ulcer without hemorrhage or perforation: Secondary | ICD-10-CM

## 2013-09-05 LAB — BASIC METABOLIC PANEL
BUN: 6 mg/dL (ref 6–23)
CALCIUM: 8.6 mg/dL (ref 8.4–10.5)
CO2: 27 meq/L (ref 19–32)
CREATININE: 0.7 mg/dL (ref 0.4–1.2)
Chloride: 105 mEq/L (ref 96–112)
GFR: 97.36 mL/min (ref >60.00)
Glucose, Bld: 271 mg/dL — ABNORMAL HIGH (ref 70–99)
Potassium: 3.9 mEq/L (ref 3.5–5.1)
Sodium: 136 mEq/L (ref 135–145)

## 2013-09-05 MED ORDER — POLYSACCHARIDE IRON COMPLEX 150 MG PO CAPS
150.0000 mg | ORAL_CAPSULE | Freq: Two times a day (BID) | ORAL | Status: AC
Start: 2013-09-05 — End: ?

## 2013-09-05 MED ORDER — OMEPRAZOLE 40 MG PO CPDR: 40.0000 mg | DELAYED_RELEASE_CAPSULE | Freq: Two times a day (BID) | ORAL | Status: DC

## 2013-09-05 MED ORDER — MESALAMINE 1.2 G PO TBEC: 1.2000 g | DELAYED_RELEASE_TABLET | Freq: Two times a day (BID) | ORAL | Status: DC

## 2013-09-05 NOTE — Progress Notes (Signed)
HISTORY OF PRESENT ILLNESS:  Sabrina QuailCynthia [name] is a 58 y.o. female with multiple medical problems who was initially followed in this office for Crohn's colitis and celiac disease. 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 She presents today for routine followup. She was last seen 07/01/2013 for followup regarding gastric ulcer healing. Her ulcer was healed. Biopsies have been negative. She has continued on PPI and avoid NSAIDs. Since that time she has done well. Improved strength in appetite. She has gained about 10-15 pounds. No problems with fluid accumulation. She continues on furosemide 20 mg daily. Other GI medications include omeprazole 40 mg twice a day, iron supplement, and mesalamine. She denies any abdominal complaints. Regular bowel habits. No bleeding. She requests refill on several medications. Recent blood work has been unremarkable. Hemoglobin 12. Variable compliance with celiac diet  REVIEW OF SYSTEMS:  All non-GI ROS negative except for heart murmur, fatigue, depression  Past Medical History  Diagnosis Date  . Arthritis   . Allergic rhinitis   . Colitis   . Degenerative disc disease   . Hyperlipidemia   . Hypertension   . Celiac disease   . Bipolar disorder   . Macular degeneration   . Interstitial cystitis   . Diabetes mellitus without complication   . Anemia, iron deficiency 10/30/2012  . Heart murmur   . Anxiety   . Depression   . Liver disease   . Gastric ulcer   . TIA (transient ischemic attack) 06/2013    Past Surgical History  Procedure Laterality Date  . Tubal ligation    . Appendectomy    . Lumbar laminectomy    . Carpal tunnel release      bilateral  . Cesarean section    . Cysto/ hod/ instillatio clorpactin  07-03-2008  &  06-09-2001    INTERSTITIAL CYSTITIS  . Shoulder arthroscopy w/ rotator cuff repair  01-25-2008    LEFT SHOULDER /    INCLUDING CAPSULECTOMY WITH DEBRIDEMENT/  SAD WITH RESECTION OF AC  . Right shoulder arthroscopy/ open resection distal clavicle/ debridement adhesive capsulitis/ rotator cuff repair  08-30-2003  . Tonsillectomy and adenoidectomy    . Transthoracic echocardiogram  03-28-2011    NORMAL LVSF/ EF 60-65%/ VERY MILD AORTIC STENOSIS, TRIVIAL REGURG./ MILDLY DILATED LEFT ATRIUM  . Cystoscopy  11/26/2011    Procedure: CYSTOSCOPY;  Surgeon: Valetta Fulleravid S Grapey, MD;  [facility];  Service: Urology;  Laterality: N/A;  clorpactin  in bladder  . 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 Cholecystectomy      Social History Sabrina QuailCynthia [name]  reports that she has never smoked. She has never used smokeless tobacco. She reports that she does not drink alcohol or use illicit drugs.  family history includes Diabetes in her brother and mother; Hyperlipidemia in her brother; Hypertension in her brother, mother, and another family member. There is no history of Colon cancer.  Allergies  Allergen Reactions  . Sulfonamide Derivatives     Classic angioedema reaction  . Wheat Bran     Celiac's disease       PHYSICAL EXAMINATION: Vital signs: BP 108/62  Pulse 72  Ht 5\' 4"  (1.626 m)  Wt 153 lb 8 oz (69.627 kg)  BMI 26.34 kg/m2 General: Well-developed, well-nourished, no acute distress  HEENT: Sclerae are anicteric, conjunctiva pink. Oral mucosa intact Lungs: Clear Heart: Regular Abdomen: soft, nontender, nondistended, no obvious ascites, no peritoneal signs, normal bowel sounds. No organomegaly. Mild soreness over the surgical incision site Extremities: No edema Psychiatric: alert and oriented x3. Cooperative   ASSESSMENT:  #1. Hepatic cirrhosis with portal hypertension. Etiology uncertain. Suspect NASH versus burned out autoimmune. Compensated. #2.  History of ascites. Resolved with diuretics. On furosemide currently #3. Status post splenectomy and cholecystectomy August 2014 #4. Celiac disease #5. Mild Crohn's colitis. On mesalamine #6. History of gastric ulcer. Followed to healing. On PPI.  PLAN:  #1. Continue current medications without change #2. Refill mesalamine, omeprazole, and iron supplement as requested #3. Strict adherence to gluten-free diet #4. Basic metabolic panel in 6 weeks #5. Routine office followup in 6 months

## 2013-09-05 NOTE — Patient Instructions (Signed)
We have sent the following medications to your pharmacy for you to pick up at your convenience:  Lialda, Iron, Prilosec  Your physician has requested that you go to the basement for the following lab work before leaving today:  BMET.  Please return in approximately 6 weeks to repeat this lab  Continue to stay Gluten free  Please follow up with Dr. Marina Goodell in 6 months

## 2013-10-07 ENCOUNTER — Emergency Department (HOSPITAL_COMMUNITY)
Admission: EM | Admit: 2013-10-07 | Discharge: 2013-10-07 | Disposition: A | Discharge status: Home / Self Care | Payer: 59 | Attending: Emergency Medicine | Admitting: Emergency Medicine

## 2013-10-07 ENCOUNTER — Emergency Department (HOSPITAL_COMMUNITY): Payer: 59

## 2013-10-07 ENCOUNTER — Encounter (HOSPITAL_COMMUNITY): Payer: Self-pay | Admitting: Emergency Medicine

## 2013-10-07 ENCOUNTER — Telehealth: Payer: Self-pay

## 2013-10-07 ENCOUNTER — Ambulatory Visit (INDEPENDENT_AMBULATORY_CARE_PROVIDER_SITE_OTHER): Payer: 59 | Admitting: Emergency Medicine

## 2013-10-07 ENCOUNTER — Telehealth: Payer: Self-pay | Admitting: Family Medicine

## 2013-10-07 VITALS — BP 147/83 | HR 67 | Temp 98.2°F | Resp 17

## 2013-10-07 DIAGNOSIS — R42 Dizziness and giddiness: Secondary | ICD-10-CM | POA: Insufficient documentation

## 2013-10-07 DIAGNOSIS — F319 Bipolar disorder, unspecified: Secondary | ICD-10-CM | POA: Insufficient documentation

## 2013-10-07 DIAGNOSIS — Z87448 Personal history of other diseases of urinary system: Secondary | ICD-10-CM | POA: Insufficient documentation

## 2013-10-07 DIAGNOSIS — R259 Unspecified abnormal involuntary movements: Secondary | ICD-10-CM | POA: Insufficient documentation

## 2013-10-07 DIAGNOSIS — Y929 Unspecified place or not applicable: Secondary | ICD-10-CM | POA: Insufficient documentation

## 2013-10-07 DIAGNOSIS — Z8711 Personal history of peptic ulcer disease: Secondary | ICD-10-CM | POA: Insufficient documentation

## 2013-10-07 DIAGNOSIS — E119 Type 2 diabetes mellitus without complications: Secondary | ICD-10-CM | POA: Insufficient documentation

## 2013-10-07 DIAGNOSIS — R5383 Other fatigue: Secondary | ICD-10-CM

## 2013-10-07 DIAGNOSIS — R4182 Altered mental status, unspecified: Secondary | ICD-10-CM

## 2013-10-07 DIAGNOSIS — G259 Extrapyramidal and movement disorder, unspecified: Secondary | ICD-10-CM

## 2013-10-07 DIAGNOSIS — Z8709 Personal history of other diseases of the respiratory system: Secondary | ICD-10-CM | POA: Insufficient documentation

## 2013-10-07 DIAGNOSIS — F3289 Other specified depressive episodes: Secondary | ICD-10-CM | POA: Insufficient documentation

## 2013-10-07 DIAGNOSIS — E162 Hypoglycemia, unspecified: Secondary | ICD-10-CM

## 2013-10-07 DIAGNOSIS — D509 Iron deficiency anemia, unspecified: Secondary | ICD-10-CM | POA: Insufficient documentation

## 2013-10-07 DIAGNOSIS — F329 Major depressive disorder, single episode, unspecified: Secondary | ICD-10-CM | POA: Insufficient documentation

## 2013-10-07 DIAGNOSIS — Z8719 Personal history of other diseases of the digestive system: Secondary | ICD-10-CM | POA: Insufficient documentation

## 2013-10-07 DIAGNOSIS — Z8673 Personal history of transient ischemic attack (TIA), and cerebral infarction without residual deficits: Secondary | ICD-10-CM | POA: Insufficient documentation

## 2013-10-07 DIAGNOSIS — R269 Unspecified abnormalities of gait and mobility: Secondary | ICD-10-CM | POA: Insufficient documentation

## 2013-10-07 DIAGNOSIS — M129 Arthropathy, unspecified: Secondary | ICD-10-CM | POA: Insufficient documentation

## 2013-10-07 DIAGNOSIS — I10 Essential (primary) hypertension: Secondary | ICD-10-CM | POA: Insufficient documentation

## 2013-10-07 DIAGNOSIS — Y939 Activity, unspecified: Secondary | ICD-10-CM | POA: Insufficient documentation

## 2013-10-07 DIAGNOSIS — R011 Cardiac murmur, unspecified: Secondary | ICD-10-CM | POA: Insufficient documentation

## 2013-10-07 DIAGNOSIS — Z7982 Long term (current) use of aspirin: Secondary | ICD-10-CM | POA: Insufficient documentation

## 2013-10-07 DIAGNOSIS — Z79899 Other long term (current) drug therapy: Secondary | ICD-10-CM

## 2013-10-07 DIAGNOSIS — F411 Generalized anxiety disorder: Secondary | ICD-10-CM | POA: Insufficient documentation

## 2013-10-07 DIAGNOSIS — R5381 Other malaise: Secondary | ICD-10-CM | POA: Insufficient documentation

## 2013-10-07 DIAGNOSIS — Z8669 Personal history of other diseases of the nervous system and sense organs: Secondary | ICD-10-CM | POA: Insufficient documentation

## 2013-10-07 DIAGNOSIS — W19XXXA Unspecified fall, initial encounter: Secondary | ICD-10-CM | POA: Insufficient documentation

## 2013-10-07 LAB — URINALYSIS, ROUTINE W REFLEX MICROSCOPIC
Bilirubin Urine: NEGATIVE
Glucose, UA: NEGATIVE mg/dL
Hgb urine dipstick: NEGATIVE
KETONES UR: NEGATIVE mg/dL
NITRITE: NEGATIVE
Protein, ur: NEGATIVE mg/dL
Specific Gravity, Urine: 1.007 (ref 1.005–1.030)
UROBILINOGEN UA: 1 mg/dL (ref 0.0–1.0)
pH: 7.5 (ref 5.0–8.0)

## 2013-10-07 LAB — ETHANOL

## 2013-10-07 LAB — POCT CBC
Granulocyte percent: 39.7 %G (ref 37–80)
HCT, POC: 35.8 % — AB (ref 37.7–47.9)
HEMOGLOBIN: 11.6 g/dL — AB (ref 12.2–16.2)
Lymph, poc: 5 — AB (ref 0.6–3.4)
MCH, POC: 29.7 pg (ref 27–31.2)
MCHC: 32.4 g/dL (ref 31.8–35.4)
MCV: 91.6 fL (ref 80–97)
MID (cbc): 0.8 (ref 0–0.9)
MPV: 11.2 fL (ref 0–99.8)
POC GRANULOCYTE: 3.8 (ref 2–6.9)
POC LYMPH PERCENT: 52.2 %L — AB (ref 10–50)
POC MID %: 8.1 %M (ref 0–12)
Platelet Count, POC: 207 10*3/uL (ref 142–424)
RBC: 3.91 M/uL — AB (ref 4.04–5.48)
RDW, POC: 15.1 %
WBC: 9.5 10*3/uL (ref 4.6–10.2)

## 2013-10-07 LAB — COMPREHENSIVE METABOLIC PANEL
ALT: 24 U/L (ref 0–35)
AST: 56 U/L — AB (ref 0–37)
Albumin: 2.6 g/dL — ABNORMAL LOW (ref 3.5–5.2)
Alkaline Phosphatase: 109 U/L (ref 39–117)
BUN: 5 mg/dL — ABNORMAL LOW (ref 6–23)
CALCIUM: 8.7 mg/dL (ref 8.4–10.5)
CO2: 25 mEq/L (ref 19–32)
Chloride: 105 mEq/L (ref 96–112)
Creatinine, Ser: 0.55 mg/dL (ref 0.50–1.10)
GFR calc Af Amer: >90 mL/min (ref >90)
GFR calc non Af Amer: >90 mL/min (ref >90)
Glucose, Bld: 68 mg/dL — ABNORMAL LOW (ref 70–99)
Potassium: 3.6 mEq/L — ABNORMAL LOW (ref 3.7–5.3)
SODIUM: 140 meq/L (ref 137–147)
Total Bilirubin: 1.7 mg/dL — ABNORMAL HIGH (ref 0.3–1.2)
Total Protein: 8.3 g/dL (ref 6.0–8.3)

## 2013-10-07 LAB — POCT UA - MICROSCOPIC ONLY
Amorphous: POSITIVE
CASTS, UR, LPF, POC: NEGATIVE
Crystals, Ur, HPF, POC: NEGATIVE
Mucus, UA: NEGATIVE
RBC, urine, microscopic: NEGATIVE
Yeast, UA: NEGATIVE

## 2013-10-07 LAB — POCT URINALYSIS DIPSTICK
Bilirubin, UA: NEGATIVE
Blood, UA: NEGATIVE
Glucose, UA: NEGATIVE
Ketones, UA: NEGATIVE
NITRITE UA: NEGATIVE
PH UA: 7
Protein, UA: NEGATIVE
Spec Grav, UA: 1.02
Urobilinogen, UA: 2

## 2013-10-07 LAB — GLUCOSE, POCT (MANUAL RESULT ENTRY): POC GLUCOSE: 76 mg/dL (ref 70–99)

## 2013-10-07 LAB — LACTIC ACID, PLASMA: LACTIC ACID, VENOUS: 1.6 mmol/L (ref 0.5–2.2)

## 2013-10-07 LAB — RAPID URINE DRUG SCREEN, HOSP PERFORMED
AMPHETAMINES: NOT DETECTED
BARBITURATES: NOT DETECTED
BENZODIAZEPINES: NOT DETECTED
Cocaine: NOT DETECTED
Opiates: NOT DETECTED
TETRAHYDROCANNABINOL: NOT DETECTED

## 2013-10-07 LAB — CBG MONITORING, ED
GLUCOSE-CAPILLARY: 60 mg/dL — AB (ref 70–99)
GLUCOSE-CAPILLARY: 87 mg/dL (ref 70–99)

## 2013-10-07 LAB — URINE MICROSCOPIC-ADD ON

## 2013-10-07 LAB — TROPONIN I: Troponin I: <0.3 ng/mL (ref <0.30)

## 2013-10-07 NOTE — ED Notes (Signed)
Bed: BZ16 Expected date:  Expected time:  Means of arrival:  Comments: EMS-med reaction

## 2013-10-07 NOTE — Telephone Encounter (Signed)
Agree w/ ER evaluation

## 2013-10-07 NOTE — ED Notes (Signed)
IV insertion attempted x 2, able to get IV but unable to get blood work off of it. ED phlebotomist to attempt blood draw.

## 2013-10-07 NOTE — ED Notes (Signed)
Pt. Went to x-ray. Will complete when pt. Gets back into the room. Nurse was notified.

## 2013-10-07 NOTE — Progress Notes (Addendum)
Subjective:    Patient ID: Sabrina [name], female    DOB: March 28, 1956, 58 y.o.   MRN: [id number]  HPI This chart was scribed for Sabrina [name]-MD, by Ladona Ridgel Day, Scribe. This patient was seen in room 6 and the patient's care was started at 1:22 PM.  HPI Comments: Sabrina [name] is a 58 y.o. female who presents to the [facility] for altered mental status, generalized weakness, frequent falls and dizziness, onset x1 week ago and seems to be gradually worsening. She reports has had a similar episode previously and had complete w/u having possible TIA but no hx of CVA or blood clots.   She reports over the past week she has been having about 4 or 5 episodes of diarrhea per day, she states this is abnormal for her. She has hx of celiac disease. She states both of her legs feel weak, she feels dizzy, has trouble walking up stairs or anything of exertional activity which makes her feel weak and dizzy as well sitting to standing motion. She states frequent falls lately, last fall was x6 days ago in which she fell back and hit her head, she has had no episodes of LOC or syncope. She reports tremor in her legs. She denies hx of parkinson's. She states swollen ankles. She went to her PCP this AM Dr. Beverely Low, left during triage, was told to call 911 for an ambulance, not to drive herself but she presents here to [facility] by driving herself here.   She works as a Museum/gallery exhibitions officer and states unable to type on keyboard for past week due to tremors in her arms/hands. She has been diabetic and takes no medicine, controlled w/diet. Hx of cholecystectomy and splenectomy. She denies any cardiac hx. She takes no BP medicines although 3 years ago had elevated BP. She denies cholesterol problems. She denies any CP, SOB, dysuria or frequency.   Past Medical History  Diagnosis Date  . Arthritis   . Allergic rhinitis   . Colitis   . Degenerative disc disease   . Hyperlipidemia   . Hypertension    . Celiac disease   . Bipolar disorder   . Macular degeneration   . Interstitial cystitis   . Diabetes mellitus without complication   . Anemia, iron deficiency 10/30/2012  . Heart murmur   . Anxiety   . Depression   . Liver disease   . Gastric ulcer   . TIA (transient ischemic attack) 06/2013    Allergies  Allergen Reactions  . Sulfonamide Derivatives     Classic angioedema reaction  . Wheat Bran     Celiac's disease    Meds ordered this encounter  Medications  . lamoTRIgine (LAMICTAL) 150 MG tablet    Sig: Take 150 mg by mouth daily.   Review of Systems  Constitutional: Negative for fever and chills.       Alert and oriented to person, place and time  Respiratory: Negative for cough, shortness of breath and wheezing.   Cardiovascular: Positive for leg swelling. Negative for chest pain and palpitations.  Gastrointestinal: Positive for diarrhea. Negative for abdominal pain and constipation.       Diarrhea 4-5 times/day with hx of crohn's  Genitourinary: Negative for dysuria, vaginal discharge and difficulty urinating.  Musculoskeletal: Negative for back pain, myalgias, neck pain and neck stiffness.  Neurological: Positive for dizziness, tremors, weakness and light-headedness. Negative for syncope, facial asymmetry, numbness and headaches.  Psychiatric/Behavioral: Positive for confusion and decreased concentration.  Objective:   Physical Exam Nursing note and vitals reviewed. Constitutional: Patient is oriented to person, place, and time. Patient appears well-developed and well-nourished. No distress.  HENT: PERRLA, appropriate ROM of neck with no stiffness Head: Normocephalic and atraumatic.  Neck: Neck supple. No tracheal deviation present. No JVD Neuro: asterixis of left upper and lower extremity, lip smacking with bilateral mild eye swelling, CN II-XII grossly intact, negative romberg, no dysmetria,  Symmetrical lower extremity weakness with 3/5 flexion extension  of both lower extremities. Normal sensation testing, adequate grip strength, regular two point sensation,  Cardiovascular: Normal rate, regular rhythm and 3/6 harsh systolic murmur loudest at the 2nd right intercostal that radiates up into the neck. no JVD, no rub or gallop Pulmonary/Chest: Effort normal and breath sounds normal. No respiratory distress. Patient has no wheezes. Patient has no rales.  Musculoskeletal: Normal range of motion.  Neurological: Patient is alert and oriented to person, place, and time.  Skin: Skin is warm and dry.  Psychiatric: Patient has a normal mood and affect. Patient's behavior is normal.   Triage Vitals: BP 126/80  Pulse 67  Temp(Src) 98.2 F (36.8 C) (Oral)  Resp 17  SpO2 98%     Assessment & Plan:  DIAGNOSTIC STUDIES: Oxygen Saturation is 98% on room air, normal by my interpretation.    A: 1) altered mental status 2) polypharmacy 3) tremor  4) DM 5) AS Results for orders placed in visit on 10/07/13  POCT CBC      Result Value Ref Range   WBC 9.5  4.6 - 10.2 K/uL   Lymph, poc 5.0 (*) 0.6 - 3.4   POC LYMPH PERCENT 52.2 (*) 10 - 50 %L   MID (cbc) 0.8  0 - 0.9   POC MID % 8.1  0 - 12 %M   POC Granulocyte 3.8  2 - 6.9   Granulocyte percent 39.7  37 - 80 %G   RBC 3.91 (*) 4.04 - 5.48 M/uL   Hemoglobin 11.6 (*) 12.2 - 16.2 g/dL   HCT, POC 24.5 (*) 80.9 - 47.9 %   MCV 91.6  80 - 97 fL   MCH, POC 29.7  27 - 31.2 pg   MCHC 32.4  31.8 - 35.4 g/dL   RDW, POC 98.3     Platelet Count, POC 207  142 - 424 K/uL   MPV 11.2  0 - 99.8 fL  POCT URINALYSIS DIPSTICK      Result Value Ref Range   Color, UA yellow     Clarity, UA slightly cloudy     Glucose, UA neg     Bilirubin, UA neg     Ketones, UA neg     Spec Grav, UA 1.020     Blood, UA neg     pH, UA 7.0     Protein, UA neg     Urobilinogen, UA 2.0     Nitrite, UA neg     Leukocytes, UA Trace    POCT UA - MICROSCOPIC ONLY      Result Value Ref Range   WBC, Ur, HPF, POC 0-2     RBC,  urine, microscopic neg     Bacteria, U Microscopic 1+     Mucus, UA neg     Epithelial cells, urine per micros 0-2     Crystals, Ur, HPF, POC neg     Casts, Ur, LPF, POC neg     Yeast, UA neg     Amorphous pos  Patient told by primary care physician to go via EMS to emergency department today but [facility]. Due to altered mental status today patient will go via EMS to the ER for further work-up. Will check urine, glucose, CBC, EKG prior to trasport    COORDINATION OF CARE: At 110 PM Discussed treatment plan with patient which includes UA, blood work. Patient agrees.  Do think patient needs further evaluation of her altered metal status. She is on multiple medications for her bipolar disease. She saw Dr. Evelene CroonKaur  5 weeks ago and at that time was started on Lamictal. This has helped her depression. She has had an MRI of the brain and an MRA of the brain and these have been unremarkable. She has aortic stenosis and is under cardiology care for this. I still suspect this recent worsening of symptoms is medication related.  I personally performed the services described in this documentation, which was scribed in my presence. The recorded information has been reviewed and is accurate.

## 2013-10-07 NOTE — Telephone Encounter (Signed)
FYI

## 2013-10-07 NOTE — ED Provider Notes (Signed)
CSN: [id number]     Arrival date & time 10/07/13  1504 History   First MD Initiated Contact with Patient 10/07/13 1511     Chief Complaint  Patient presents with  . Altered Mental Status  . Weakness     Patient is a 58 y.o. female presenting with dizziness. The history is provided by the patient.  Dizziness Quality:  Imbalance Severity:  Moderate Onset quality:  Gradual Duration:  1 week Timing:  Intermittent Progression:  Worsening Chronicity:  New Relieved by: sitting. Exacerbated by: walking. Associated symptoms: no chest pain, no diarrhea, no headaches, no shortness of breath, no syncope, no vision changes and no vomiting   pt reports for one week she has had dizziness with standing/walking She reports due to this she fell last weekend. No LOC.  No cp/sob.  No vomiting.  No abd pain.  No unilateral weakness.    She reports she feels tremors.  She reports she has shuffling gait.    She denies any traumatic injury from fall.  She reports it may be related to her lamictal that she started over 5 weeks ago   Past Medical History  Diagnosis Date  . Arthritis   . Allergic rhinitis   . Colitis   . Degenerative disc disease   . Hyperlipidemia   . Hypertension   . Celiac disease   . Bipolar disorder   . Macular degeneration   . Interstitial cystitis   . Diabetes mellitus without complication   . Anemia, iron deficiency 10/30/2012  . Heart murmur   . Anxiety   . Depression   . Liver disease   . Gastric ulcer   . TIA (transient ischemic attack) 06/2013   Past Surgical History  Procedure Laterality Date  . Tubal ligation    . Appendectomy    . Lumbar laminectomy    . Carpal tunnel release      bilateral  . Cesarean section    . Cysto/ hod/ instillatio clorpactin  07-03-2008  &  06-09-2001    INTERSTITIAL CYSTITIS  . Shoulder arthroscopy w/ rotator cuff repair  01-25-2008    LEFT SHOULDER /   INCLUDING CAPSULECTOMY WITH DEBRIDEMENT/  SAD WITH RESECTION OF AC  .  Right shoulder arthroscopy/ open resection distal clavicle/ debridement adhesive capsulitis/ rotator cuff repair  08-30-2003  . Tonsillectomy and adenoidectomy    . Transthoracic echocardiogram  03-28-2011    NORMAL LVSF/ EF 60-65%/ VERY MILD AORTIC STENOSIS, TRIVIAL REGURG./ MILDLY DILATED LEFT ATRIUM  . Cystoscopy  11/26/2011    Procedure: CYSTOSCOPY;  Surgeon: Valetta Fuller, MD;  [facility]: [facility];  Service: Urology;  Laterality: N/A;  clorpactin  in bladder  . 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 Cholecystectomy     Family History  Problem Relation Age of Onset  . Diabetes Mother   . Hypertension Mother   . Diabetes Brother   . Hypertension Brother   . Hyperlipidemia Brother   . Hypertension    . Colon cancer Neg Hx    History  Substance Use Topics  . Smoking status: Never Smoker   . Smokeless tobacco: Never Used  . Alcohol Use: No   OB History   Grav Para Term Preterm Abortions TAB SAB Ect Mult Living  Review of Systems  Constitutional: Negative for fever.  Eyes: Negative for visual disturbance.  Respiratory: Negative for shortness of breath.   Cardiovascular: Negative for chest pain and syncope.  Gastrointestinal: Negative for vomiting and diarrhea.  Musculoskeletal: Negative for back pain and neck pain.  Neurological: Positive for dizziness. Negative for syncope, facial asymmetry and headaches.  All other systems reviewed and are negative.     Allergies  Sulfonamide derivatives and Wheat bran  Home Medications   Current Outpatient Rx  Name  Route  Sig  Dispense  Refill  . acetaminophen (TYLENOL) 500 MG tablet   Oral   Take 1,000 mg by mouth every 6 (six) hours as needed.         . ARIPiprazole (ABILIFY) 10 MG tablet   Oral   Take 10 mg by mouth at bedtime.          Marland Kitchen. aspirin  81 MG tablet   Oral   Take 1 tablet (81 mg total) by mouth daily.         . fexofenadine (ALLEGRA) 180 MG tablet   Oral   Take 180 mg by mouth every morning.          Marland Kitchen. FLUoxetine (PROZAC) 40 MG capsule   Oral   Take 40 mg by mouth every morning.          . furosemide (LASIX) 20 MG tablet      TAKE ONE TABLET BY MOUTH ONE TIME DAILY    30 tablet   2   . iron polysaccharides (POLY-IRON 150) 150 MG capsule   Oral   Take 1 capsule (150 mg total) by mouth 2 (two) times daily.   180 capsule   3   . lamoTRIgine (LAMICTAL) 150 MG tablet   Oral   Take 150 mg by mouth daily.         Marland Kitchen. LORazepam (ATIVAN) 0.5 MG tablet   Oral   Take 0.5 mg by mouth 2 (two) times daily.          . mesalamine (LIALDA) 1.2 G EC tablet   Oral   Take 1 tablet (1.2 g total) by mouth 2 (two) times daily.   180 tablet   3   . omeprazole (PRILOSEC) 40 MG capsule   Oral   Take 1 capsule (40 mg total) by mouth 2 (two) times daily.   180 capsule   3   . pentosan polysulfate (ELMIRON) 100 MG capsule   Oral   Take 200 mg by mouth 2 (two) times daily.          . traZODone (DESYREL) 50 MG tablet   Oral   Take 25 mg by mouth at bedtime.            BP 152/66  Pulse 75  Temp(Src) 97.5 F (36.4 C) (Oral)  Resp 20  SpO2 94% Physical Exam CONSTITUTIONAL: Well developed/well nourished HEAD: Normocephalic/atraumatic EYES: EOMI/PERRL, no nystagmus,  no ptosis ENMT: Mucous membranes moist NECK: supple no meningeal signs Spine - no CTL tenderness CV: S1/S2 noted, loud systolic ejection murmur LUNGS: Lungs are clear to auscultation bilaterally, no apparent distress ABDOMEN: soft, nontender, no rebound or guarding GU:no cva tenderness NEURO:Awake/alert, facies symmetric, no arm or leg drift is noted Equal strength with shoulder abduction, elbow flex/extension, wrist flex/extension in upper extremities  Equal strength with hip flexion,knee flex/extension, foot dorsi/plantar  flexion Cranial nerves 3/4/5/6/01/05/09/11/12 tested and intact Pt with shuffling gait noted and she appears unsteady Sensation to  light touch intact in all extremities EXTREMITIES: pulses normal, full ROM, pitting edema noted to lower extremities.  No signs of trauma/deformity to any of her extremities SKIN: warm, color normal PSYCH: no abnormalities of mood noted  ED Course  Procedures   tPA in stroke considered but not given due to:  Onset over 3-4.5hours   6:33 PM Pt feels well She wants to go home She now admits most of these symptoms have been ongoing for months, worse in past 10 days She thinks she has parkinson disease She has shuffling gait and tremor, so this is possible I offered admission because I can not fully r/o subacute CVA She refuses admission and will f/u with PCP and likely neurology next week We discussed strict return precautions  She did have drop in glucose, but this improved with oral fluids She understands she is a fall risk but wishes to go home and reports she feels safe at home  Labs Review Labs Reviewed  COMPREHENSIVE METABOLIC PANEL - Abnormal; Notable for the following:    Potassium 3.6 (*)    Glucose, Bld 68 (*)    BUN 5 (*)    Albumin 2.6 (*)    AST 56 (*)    Total Bilirubin 1.7 (*)    All other components within normal limits  URINALYSIS, ROUTINE W REFLEX MICROSCOPIC - Abnormal; Notable for the following:    Leukocytes, UA TRACE (*)    All other components within normal limits  CBG MONITORING, ED - Abnormal; Notable for the following:    Glucose-Capillary 60 (*)    All other components within normal limits  ETHANOL  URINE RAPID DRUG SCREEN (HOSP PERFORMED)  TROPONIN I  LACTIC ACID, PLASMA  URINE MICROSCOPIC-ADD ON  CBG MONITORING, ED   Imaging Review Ct Head Wo Contrast  10/07/2013   CLINICAL DATA:  Altered mental status, weakness.  EXAM: CT HEAD WITHOUT CONTRAST  TECHNIQUE: Contiguous axial images were obtained from the base of  the skull through the vertex without intravenous contrast.  COMPARISON:  MR HEAD W/O CM dated 07/21/2013; CT HEAD W/O CM dated 07/21/2013  FINDINGS: The ventricles and sulci are normal. No intraparenchymal hemorrhage, mass effect nor midline shift. No acute large vascular territory infarcts.  No abnormal extra-axial fluid collections. Basal cisterns are patent.  No skull fracture. Visualized paranasal sinuses and mastoid air-cells are well-aerated. The included ocular globes and orbital contents are non-suspicious.  IMPRESSION: No acute intracranial process.   Electronically Signed   By: Awilda Metro   On: 10/07/2013 16:45   Dg Chest Portable 1 View  10/07/2013   CLINICAL DATA:  Cough.  EXAM: PORTABLE CHEST - 1 VIEW  COMPARISON:  DG CHEST 2 VIEW dated 07/21/2013  FINDINGS: Mediastinum and hilar structures normal. Cardiomegaly with mild pulmonary venous prominence. No pleural effusion or pneumothorax. No focal pulmonary infiltrate.  IMPRESSION: Cardiomegaly with mild pulmonary venous congestion . No focal infiltrate .   Electronically Signed   By: Maisie Fus  Register   On: 10/07/2013 16:11     EKG Interpretation   Date/Time:  Friday October 07 2013 16:11:05 EDT Ventricular Rate:  66 PR Interval:  165 QRS Duration: 97 QT Interval:  454 QTC Calculation: 476 R Axis:   94 Text Interpretation:  Sinus rhythm Borderline right axis deviation  Borderline T wave abnormalities Baseline wander in lead(s) V3 V5 artifact  noted Confirmed by Bebe Shaggy  MD, Layson Bertsch ([id number]) on 10/07/2013 4:46:29 PM      MDM   Final diagnoses:  Dizziness  Fall  Hypoglycemia    Nursing notes including past medical history and social history reviewed and considered in documentation Labs/vital reviewed and considered Previous records reviewed and considered xrays reviewed and considered     Joya Gaskins, MD 10/07/13 1836

## 2013-10-07 NOTE — Telephone Encounter (Signed)
Sabrina [name] from [facility] states that Sabrina was seen there for confusion, stumbling/losing her balance, and experiencing swelling in her feet. Patient saw Dr Cleta Alberts and he feels like the symptoms are coming from her medications that she is currently on specifically Lamictal. Patient was sent to the ED.

## 2013-10-07 NOTE — ED Notes (Signed)
Pt states about 10 days ago started feeling weak, easily confused (forgetting steps to do when performing routine tasks at work, difficulty remembering numbers), legs started feeling shaky, started having shuffling gait. Pt states sx have progressively worsened over past 10 days.

## 2013-10-07 NOTE — Discharge Instructions (Signed)

## 2013-10-07 NOTE — Telephone Encounter (Signed)
Called to triage patient.  No answer.  Left message for call back.   Patient has an appointment scheduled for 10/13/13 at 11:15a for loss of balance, stumbling,and  feet swelling.

## 2013-10-07 NOTE — Telephone Encounter (Signed)
Spoke with patient.  Patient stated that she has been confused, stumbling/losing her balance, and experiencing swelling in her feet.  She denies unilateral weakness, but states that she's weak throughout.  She is currently at work and states that she's mixing up numbers given to her and doesn't know what procedures in the right order.  Patient was advised to go to the emergency room and to not drive but to call EMS instead.  Patient has a hx. of TIA, hypertension, DM, and hyperlipidemia.

## 2013-10-07 NOTE — ED Notes (Addendum)
Per EMS patient reports to ED from [facility] where patient was seen for generalized weakness, muscle tremors, frequent falls, dizziness, muscle tremors for a week. Urgent Care physician states patient was put on new medications for bipolar disorder and suspects this to be the cause of the symptoms. EMS uncertain of patient's normal mental status, but states that patient works as a Museum/gallery exhibitions officer.

## 2013-10-10 ENCOUNTER — Encounter: Payer: Self-pay | Admitting: Family Medicine

## 2013-10-11 ENCOUNTER — Ambulatory Visit (INDEPENDENT_AMBULATORY_CARE_PROVIDER_SITE_OTHER): Payer: 59 | Admitting: Neurology

## 2013-10-11 ENCOUNTER — Encounter: Payer: Self-pay | Admitting: Neurology

## 2013-10-11 VITALS — BP 134/64 | HR 68 | Ht 63.0 in | Wt 156.0 lb

## 2013-10-11 DIAGNOSIS — R945 Abnormal results of liver function studies: Secondary | ICD-10-CM

## 2013-10-11 DIAGNOSIS — R7989 Other specified abnormal findings of blood chemistry: Secondary | ICD-10-CM

## 2013-10-11 DIAGNOSIS — R259 Unspecified abnormal involuntary movements: Secondary | ICD-10-CM

## 2013-10-11 DIAGNOSIS — R251 Tremor, unspecified: Secondary | ICD-10-CM

## 2013-10-11 DIAGNOSIS — R269 Unspecified abnormalities of gait and mobility: Secondary | ICD-10-CM | POA: Insufficient documentation

## 2013-10-11 NOTE — Progress Notes (Signed)
[facility] [address] [place]. [place] [address] [phone]       OFFICE FOLLOW-UP NOTE  Sabrina [name] Date of Birth:  04/02/1956 Medical Record Number:  [id number]   HPI: Sabrina [name] is a 9 year Caucasian lady who who had a scheduled appointment to see me today for followup from hospital consultation for TIA in January 2015 however she has a multitude of totally new complaints today which required detail evaluation. She initially was admitted on 07/21/13 with symptoms of transient left-sided weakness upon awakening in the morning. The symptoms resolved shortly after arrival exam was nonfocal. CT head was negative. MRI scan did not reveal any acute infarct. It did show some T1 hyperintensities in the basal ganglia consistent with patient's known liver cirrhosis. Lipid profile is significant only for LDL 109, HbA1c was normal.4. Patient started Aspirin for stroke prevention. Carotid ultrasound and 2-D echo were both normal. 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 She had bone marrow biopsy which was nondiagnostic. She has been seeing gastroenterologist Dr. Henrene Pastor etiology for liver cirrhosis unclear wether NASH or autoimmune or not. Patient does states that since discharge from the hospital last   she's had progressive symptoms of confusion, memory difficulties, or generalized weakness, tremors, gait and balance problems. She is required to walk with a walker to avoid falls or injuries. She states that the tremulousness in her hands and feet was so severe that she was seen in the emergency room on 04/08/14. Ct head was  unremarkable. Labs were significant for decreased glucose of 64 and she was felt to be hypoglycemic and treated. Bilirubin was elevated at 1.7 and liver enzymes were all slightly elevated. The patient had been started on Lamotrigine 4 weeks ago and this was discontinued thinking it may be  responsible for symptoms. The patient states her symptoms are persistent spread stopping lamotrigine. She hasn't been on aripiprazole for bipolar disorder for at least a year and she sees a psychiatrist Dr. Chucky May. The patient denies any significant fall head or neck injury neck pain, radicular pain, numbness tingling or burning in her feet. She is no trouble controlling her bowels or bladders. She has not had any spine imaging done.  ROS:   14 system review of systems is positive for confusion, memory loss, dizziness, numbness, speech difficulty, weakness, tremors, facial droop, agitation confusion, decreased concentration, anxiety, depression, walking difficulty, neck stiffness, daytime sleepiness, restless legs, excessive thirst, fatigue, cough, leg swelling, or murmur.  PMH:  Past Medical History  Diagnosis Date  . Arthritis   . Allergic rhinitis   . Colitis   . Degenerative disc disease   . Hyperlipidemia   . Hypertension   . Celiac disease   . Bipolar disorder   . Macular degeneration   . Interstitial cystitis   . Diabetes mellitus without complication   . Anemia, iron deficiency 10/30/2012  . Heart murmur   . Anxiety   . Depression   . Liver disease   . Gastric ulcer   . TIA (transient ischemic attack) 06/2013    Social History:  History   Social History  . Marital Status: Married    Spouse Name: N/A    Number of Children: 1  . Years of Education: N/A   Occupational History  . TRANSCRIPTIONIST --[employer]    Social History Main Topics  . Smoking status: Never Smoker   . Smokeless tobacco: Never Used  .  Alcohol Use: No  . Drug Use: No  . Sexual Activity: No   Other Topics Concern  . Not on file   Social History Narrative   Daughter lives in [place], [place]    Medications:   Current Outpatient Prescriptions on File Prior to Visit  Medication Sig Dispense Refill  . ARIPiprazole (ABILIFY) 10 MG tablet Take 10 mg by mouth at bedtime.       Marland Kitchen aspirin  81 MG tablet Take 1 tablet (81 mg total) by mouth daily.      . fexofenadine (ALLEGRA) 180 MG tablet Take 180 mg by mouth every morning.       Marland Kitchen FLUoxetine (PROZAC) 40 MG capsule Take 40 mg by mouth every morning.       . furosemide (LASIX) 20 MG tablet TAKE ONE TABLET BY MOUTH ONE TIME DAILY   30 tablet  2  . iron polysaccharides (POLY-IRON 150) 150 MG capsule Take 1 capsule (150 mg total) by mouth 2 (two) times daily.  180 capsule  3  . lamoTRIgine (LAMICTAL) 150 MG tablet Take 150 mg by mouth daily.      Marland Kitchen LORazepam (ATIVAN) 0.5 MG tablet Take 0.5 mg by mouth daily.       . mesalamine (LIALDA) 1.2 G EC tablet Take 1 tablet (1.2 g total) by mouth 2 (two) times daily.  180 tablet  3  . omeprazole (PRILOSEC) 40 MG capsule Take 1 capsule (40 mg total) by mouth 2 (two) times daily.  180 capsule  3  . pentosan polysulfate (ELMIRON) 100 MG capsule Take 200 mg by mouth 2 (two) times daily.       . traZODone (DESYREL) 50 MG tablet Take 25 mg by mouth at bedtime.         No current facility-administered medications on file prior to visit.    Allergies:   Allergies  Allergen Reactions  . Sulfonamide Derivatives     Classic angioedema reaction  . Wheat Bran     Celiac's disease    Physical Exam General: Frail middle-aged Caucasian lady, seated, in no evident distress Head: head normocephalic and atraumatic. Orohparynx benign Neck: supple with no carotid or supraclavicular bruits Cardiovascular: regular rate and rhythm, no murmurs Musculoskeletal: no deformity Skin:  no rash/petichiae. No jaundice on conjunctiva Vascular:  Normal pulses all extremities Filed Vitals:   10/11/13 1424  BP: 134/64  Pulse: 68   Neurologic Exam Mental Status: Awake and fully alert. Oriented to place and time. Recent and remote memory intact. Attention span, concentration and fund of knowledge limited. Mood and affect appropriate. Did not do Clock drawing. Mini-Mental status exam score 24/28 with deficits in  recall and attention. Animal fluency test 5 only. Genetic depression scale 3 only not depressed. Positive glabellar tap. Palmomental reflex absent. Cranial Nerves: Fundoscopic exam reveals sharp disc margins. Pupils equal, briskly reactive to light. Extraocular movements full without nystagmus. Visual fields full to confrontation. Hearing intact. Facial sensation intact. Face, tongue, palate moves normally and symmetrically. Mild mast face is present. Decreased facial expression. Motor: Normal bulk and tone. Normal strength in all tested extremity muscles. Mild asterixis in both hands. No resting tremor. Minimum intermittent action tremor of the hands. No cogwheel rigidity. Mild bradykinesia present. No drooling of saliva. Sensory.: intact to touch and pinprick and vibratory sensation.  Coordination: Rapid alternating movements normal in all extremities. Finger-to-nose and heel-to-shin performed accurately bilaterally. Gait and Station: Arises from chair with  difficulty. Stance is broadbased. Ataxic gait with short  steps with some festination. Poor postural balance with retropulsion and would fall if not caught Reflexes: 3+ brisk and symmetric except ankle jerks 1+. No clonus.. Toes downgoing.      ASSESSMENT: 75 year caucasian lady with subacute worsening of gait, balance,confusion, tremors and frequent falls of undetermined etiology. Recent abnormal lab work with elevated liver enzymes with known baseline mild cirrhosis. Clinical exam and findings suggest mild drug-induced parkinsonism but hyperreflexia raises concern for compressive spinal cord lesion versus new interval bilateralstrokes    PLAN: I had a long discussion with the patient and a friend regarding her worsening gait, balance and tremors and discuss plan for further evaluation. I recommend she reduce her Aripripazole to 5 mg daily and consider stopping it after seeing and discussing it with her psychiatrist Dr.Kaur I recommend she see  a gastroenterologist Dr. Henrene Pastor to discuss further evaluation for her abnormal liver function tests and mild jaundice. Check MRI scan of the brain and cervical spine to rule out any compressive lesions or new strokes. Continue to use a walker at all times to avoid falls.Keep appointment with Dr Birdie Riddle next week and stay out of work till then.Return for followup in 2 months or call earlier if necessary. This was a prolonged facet with extensive review history, hospital chart, lab work, imaging and medical decision making of high complexity with total time needed 60+ minutes.    Note: This document was prepared with digital dictation and possible smart phrase technology. Any transcriptional errors that result from this process are unintentional

## 2013-10-11 NOTE — Patient Instructions (Addendum)
I had a long discussion with the patient and a friend regarding her worsening gait, balance and tremors and discuss plan for further evaluation. I recommend she reduce her Aripripazole to 5 mg daily and consider stopping it after seeing and discussing it with her psychiatrist Dr.Kaur I recommend she see a gastroenterologist Dr. Marina Goodell to discuss further evaluation for her abnormal liver function tests and mild jaundice. Check MRI scan of the brain and cervical spine to rule out any compressive lesions or new strokes. Continue to use a walker at all times to avoid falls.Keep appointment with Dr Beverely Low next week and stay out of work till then.Return for followup in 2 months or call earlier if necessary.  Fall Prevention and Home Safety Falls cause injuries and can affect all age groups. It is possible to use preventive measures to significantly decrease the likelihood of falls. There are many simple measures which can make your home safer and prevent falls. OUTDOORS  Repair cracks and edges of walkways and driveways.  Remove high doorway thresholds.  Trim shrubbery on the main path into your home.  Have good outside lighting.  Clear walkways of tools, rocks, debris, and clutter.  Check that handrails are not broken and are securely fastened. Both sides of steps should have handrails.  Have leaves, snow, and ice cleared regularly.  Use sand or salt on walkways during winter months.  In the garage, clean up grease or oil spills. BATHROOM  Install night lights.  Install grab bars by the toilet and in the tub and shower.  Use non-skid mats or decals in the tub or shower.  Place a plastic non-slip stool in the shower to sit on, if needed.  Keep floors dry and clean up all water on the floor immediately.  Remove soap buildup in the tub or shower on a regular basis.  Secure bath mats with non-slip, double-sided rug tape.  Remove throw rugs and tripping hazards from the  floors. BEDROOMS  Install night lights.  Make sure a bedside light is easy to reach.  Do not use oversized bedding.  Keep a telephone by your bedside.  Have a firm chair with side arms to use for getting dressed.  Remove throw rugs and tripping hazards from the floor. KITCHEN  Keep handles on pots and pans turned toward the center of the stove. Use back burners when possible.  Clean up spills quickly and allow time for drying.  Avoid walking on wet floors.  Avoid hot utensils and knives.  Position shelves so they are not too high or low.  Place commonly used objects within easy reach.  If necessary, use a sturdy step stool with a grab bar when reaching.  Keep electrical cables out of the way.  Do not use floor polish or wax that makes floors slippery. If you must use wax, use non-skid floor wax.  Remove throw rugs and tripping hazards from the floor. STAIRWAYS  Never leave objects on stairs.  Place handrails on both sides of stairways and use them. Fix any loose handrails. Make sure handrails on both sides of the stairways are as long as the stairs.  Check carpeting to make sure it is firmly attached along stairs. Make repairs to worn or loose carpet promptly.  Avoid placing throw rugs at the top or bottom of stairways, or properly secure the rug with carpet tape to prevent slippage. Get rid of throw rugs, if possible.  Have an electrician put in a light switch at the  top and bottom of the stairs. OTHER FALL PREVENTION TIPS  Wear low-heel or rubber-soled shoes that are supportive and fit well. Wear closed toe shoes.  When using a stepladder, make sure it is fully opened and both spreaders are firmly locked. Do not climb a closed stepladder.  Add color or contrast paint or tape to grab bars and handrails in your home. Place contrasting color strips on first and last steps.  Learn and use mobility aids as needed. Install an electrical emergency response  system.  Turn on lights to avoid dark areas. Replace light bulbs that burn out immediately. Get light switches that glow.  Arrange furniture to create clear pathways. Keep furniture in the same place.  Firmly attach carpet with non-skid or double-sided tape.  Eliminate uneven floor surfaces.  Select a carpet pattern that does not visually hide the edge of steps.  Be aware of all pets. OTHER HOME SAFETY TIPS  Set the water temperature for 120 F (48.8 C).  Keep emergency numbers on or near the telephone.  Keep smoke detectors on every level of the home and near sleeping areas. Document Released: 06/06/2002 Document Revised: 12/16/2011 Document Reviewed: 09/05/2011 [facility] Patient Information 2014 [employer] [place].

## 2013-10-11 NOTE — Telephone Encounter (Signed)
Pt is scheduled with dr Pearlean Browniesethi @ 2:15 today.

## 2013-10-12 ENCOUNTER — Telehealth: Payer: Self-pay

## 2013-10-12 NOTE — Telephone Encounter (Signed)
Medication and allergies:  Reviewed and updated  90 day supply/mail order: n/a Local pharmacy:  [id number] - [place], [place] - [address]   Immunizations due:  UTD   A/P: No changes to personal, family history or past surgical hx PAP- 08/18/11- normal CCS- 07/21/12- normal MMG- 11/23/12- negative Flu- 04/28/13 Tdap- 07/01/07   To Discuss with Provider: Nothing at this time.

## 2013-10-13 ENCOUNTER — Ambulatory Visit (INDEPENDENT_AMBULATORY_CARE_PROVIDER_SITE_OTHER): Payer: 59 | Admitting: Family Medicine

## 2013-10-13 ENCOUNTER — Telehealth: Payer: Self-pay | Admitting: *Deleted

## 2013-10-13 ENCOUNTER — Emergency Department (HOSPITAL_COMMUNITY): Payer: 59

## 2013-10-13 ENCOUNTER — Ambulatory Visit: Payer: 59 | Admitting: Family Medicine

## 2013-10-13 ENCOUNTER — Encounter: Payer: Self-pay | Admitting: Family Medicine

## 2013-10-13 ENCOUNTER — Observation Stay (HOSPITAL_COMMUNITY): Payer: 59

## 2013-10-13 ENCOUNTER — Inpatient Hospital Stay (HOSPITAL_COMMUNITY)
Admission: EM | Admit: 2013-10-13 | Discharge: 2013-10-16 | DRG: 442 | Disposition: A | Discharge status: Home / Self Care | Payer: 59 | Attending: Internal Medicine | Admitting: Internal Medicine

## 2013-10-13 ENCOUNTER — Encounter (HOSPITAL_COMMUNITY): Payer: Self-pay | Admitting: Emergency Medicine

## 2013-10-13 VITALS — BP 120/72 | HR 78 | Temp 98.2°F | Resp 16

## 2013-10-13 DIAGNOSIS — R945 Abnormal results of liver function studies: Secondary | ICD-10-CM

## 2013-10-13 DIAGNOSIS — Z7982 Long term (current) use of aspirin: Secondary | ICD-10-CM

## 2013-10-13 DIAGNOSIS — I359 Nonrheumatic aortic valve disorder, unspecified: Secondary | ICD-10-CM | POA: Diagnosis present

## 2013-10-13 DIAGNOSIS — R7989 Other specified abnormal findings of blood chemistry: Secondary | ICD-10-CM

## 2013-10-13 DIAGNOSIS — Z569 Unspecified problems related to employment: Secondary | ICD-10-CM

## 2013-10-13 DIAGNOSIS — Z8673 Personal history of transient ischemic attack (TIA), and cerebral infarction without residual deficits: Secondary | ICD-10-CM

## 2013-10-13 DIAGNOSIS — R4789 Other speech disturbances: Secondary | ICD-10-CM | POA: Diagnosis present

## 2013-10-13 DIAGNOSIS — K7290 Hepatic failure, unspecified without coma: Secondary | ICD-10-CM

## 2013-10-13 DIAGNOSIS — K769 Liver disease, unspecified: Secondary | ICD-10-CM | POA: Diagnosis present

## 2013-10-13 DIAGNOSIS — K7460 Unspecified cirrhosis of liver: Secondary | ICD-10-CM

## 2013-10-13 DIAGNOSIS — G459 Transient cerebral ischemic attack, unspecified: Secondary | ICD-10-CM

## 2013-10-13 DIAGNOSIS — K5010 Crohn's disease of large intestine without complications: Secondary | ICD-10-CM | POA: Diagnosis present

## 2013-10-13 DIAGNOSIS — Z566 Other physical and mental strain related to work: Secondary | ICD-10-CM

## 2013-10-13 DIAGNOSIS — F341 Dysthymic disorder: Secondary | ICD-10-CM | POA: Diagnosis present

## 2013-10-13 DIAGNOSIS — E785 Hyperlipidemia, unspecified: Secondary | ICD-10-CM | POA: Diagnosis present

## 2013-10-13 DIAGNOSIS — H3530 Unspecified macular degeneration: Secondary | ICD-10-CM | POA: Diagnosis present

## 2013-10-13 DIAGNOSIS — R269 Unspecified abnormalities of gait and mobility: Secondary | ICD-10-CM | POA: Diagnosis present

## 2013-10-13 DIAGNOSIS — D689 Coagulation defect, unspecified: Secondary | ICD-10-CM | POA: Diagnosis present

## 2013-10-13 DIAGNOSIS — F411 Generalized anxiety disorder: Secondary | ICD-10-CM | POA: Diagnosis present

## 2013-10-13 DIAGNOSIS — E119 Type 2 diabetes mellitus without complications: Secondary | ICD-10-CM

## 2013-10-13 DIAGNOSIS — F319 Bipolar disorder, unspecified: Secondary | ICD-10-CM | POA: Diagnosis present

## 2013-10-13 DIAGNOSIS — I350 Nonrheumatic aortic (valve) stenosis: Secondary | ICD-10-CM | POA: Diagnosis present

## 2013-10-13 DIAGNOSIS — R5381 Other malaise: Secondary | ICD-10-CM

## 2013-10-13 DIAGNOSIS — R251 Tremor, unspecified: Secondary | ICD-10-CM

## 2013-10-13 DIAGNOSIS — E1169 Type 2 diabetes mellitus with other specified complication: Secondary | ICD-10-CM | POA: Diagnosis present

## 2013-10-13 DIAGNOSIS — I10 Essential (primary) hypertension: Secondary | ICD-10-CM | POA: Diagnosis present

## 2013-10-13 DIAGNOSIS — E876 Hypokalemia: Secondary | ICD-10-CM | POA: Diagnosis not present

## 2013-10-13 DIAGNOSIS — R5383 Other fatigue: Secondary | ICD-10-CM

## 2013-10-13 DIAGNOSIS — K7682 Hepatic encephalopathy: Principal | ICD-10-CM | POA: Diagnosis present

## 2013-10-13 DIAGNOSIS — R259 Unspecified abnormal involuntary movements: Secondary | ICD-10-CM

## 2013-10-13 DIAGNOSIS — Z833 Family history of diabetes mellitus: Secondary | ICD-10-CM

## 2013-10-13 DIAGNOSIS — I639 Cerebral infarction, unspecified: Secondary | ICD-10-CM

## 2013-10-13 DIAGNOSIS — Z79899 Other long term (current) drug therapy: Secondary | ICD-10-CM

## 2013-10-13 DIAGNOSIS — Z0279 Encounter for issue of other medical certificate: Secondary | ICD-10-CM

## 2013-10-13 DIAGNOSIS — R4182 Altered mental status, unspecified: Secondary | ICD-10-CM

## 2013-10-13 HISTORY — DX: Type 2 diabetes mellitus without complications: E11.9

## 2013-10-13 HISTORY — DX: Personal history of other medical treatment: Z92.89

## 2013-10-13 LAB — COMPREHENSIVE METABOLIC PANEL
ALT: 19 U/L (ref 0–35)
AST: 50 U/L — AB (ref 0–37)
Albumin: 2.5 g/dL — ABNORMAL LOW (ref 3.5–5.2)
Alkaline Phosphatase: 111 U/L (ref 39–117)
BUN: 5 mg/dL — AB (ref 6–23)
CO2: 21 mEq/L (ref 19–32)
Calcium: 8.3 mg/dL — ABNORMAL LOW (ref 8.4–10.5)
Chloride: 104 mEq/L (ref 96–112)
Creatinine, Ser: 0.51 mg/dL (ref 0.50–1.10)
GFR calc Af Amer: >90 mL/min (ref >90)
GFR calc non Af Amer: >90 mL/min (ref >90)
Glucose, Bld: 135 mg/dL — ABNORMAL HIGH (ref 70–99)
POTASSIUM: 3.5 meq/L — AB (ref 3.7–5.3)
SODIUM: 137 meq/L (ref 137–147)
TOTAL PROTEIN: 8.1 g/dL (ref 6.0–8.3)
Total Bilirubin: 2 mg/dL — ABNORMAL HIGH (ref 0.3–1.2)

## 2013-10-13 LAB — DIFFERENTIAL
BASOS ABS: 0.1 10*3/uL (ref 0.0–0.1)
BASOS PCT: 1 % (ref 0–1)
EOS ABS: 0 10*3/uL (ref 0.0–0.7)
Eosinophils Relative: 0 % (ref 0–5)
Lymphocytes Relative: 39 % (ref 12–46)
Lymphs Abs: 3.2 10*3/uL (ref 0.7–4.0)
Monocytes Absolute: 1.1 10*3/uL — ABNORMAL HIGH (ref 0.1–1.0)
Monocytes Relative: 13 % — ABNORMAL HIGH (ref 3–12)
NEUTROS ABS: 3.9 10*3/uL (ref 1.7–7.7)
NEUTROS PCT: 47 % (ref 43–77)

## 2013-10-13 LAB — URINALYSIS, ROUTINE W REFLEX MICROSCOPIC
Bilirubin Urine: NEGATIVE
Glucose, UA: NEGATIVE mg/dL
HGB URINE DIPSTICK: NEGATIVE
Ketones, ur: NEGATIVE mg/dL
Leukocytes, UA: NEGATIVE
NITRITE: NEGATIVE
PROTEIN: NEGATIVE mg/dL
Specific Gravity, Urine: 1.01 (ref 1.005–1.030)
UROBILINOGEN UA: 1 mg/dL (ref 0.0–1.0)
pH: 7 (ref 5.0–8.0)

## 2013-10-13 LAB — CBC
HCT: 34.2 % — ABNORMAL LOW (ref 36.0–46.0)
Hemoglobin: 12.2 g/dL (ref 12.0–15.0)
MCH: 31.1 pg (ref 26.0–34.0)
MCHC: 35.7 g/dL (ref 30.0–36.0)
MCV: 87.2 fL (ref 78.0–100.0)
PLATELETS: 226 10*3/uL (ref 150–400)
RBC: 3.92 MIL/uL (ref 3.87–5.11)
RDW: 16.6 % — ABNORMAL HIGH (ref 11.5–15.5)
WBC: 8.3 10*3/uL (ref 4.0–10.5)

## 2013-10-13 LAB — RAPID URINE DRUG SCREEN, HOSP PERFORMED
AMPHETAMINES: NOT DETECTED
Barbiturates: NOT DETECTED
Benzodiazepines: NOT DETECTED
Cocaine: NOT DETECTED
OPIATES: NOT DETECTED
Tetrahydrocannabinol: NOT DETECTED

## 2013-10-13 LAB — I-STAT CHEM 8, ED
CALCIUM ION: 1.16 mmol/L (ref 1.12–1.23)
CHLORIDE: 104 meq/L (ref 96–112)
CREATININE: 0.6 mg/dL (ref 0.50–1.10)
GLUCOSE: 135 mg/dL — AB (ref 70–99)
HEMATOCRIT: 42 % (ref 36.0–46.0)
Hemoglobin: 14.3 g/dL (ref 12.0–15.0)
POTASSIUM: 3.3 meq/L — AB (ref 3.7–5.3)
Sodium: 143 mEq/L (ref 137–147)
TCO2: 22 mmol/L (ref 0–100)

## 2013-10-13 LAB — APTT: APTT: 41 s — AB (ref 24–37)

## 2013-10-13 LAB — I-STAT CG4 LACTIC ACID, ED: Lactic Acid, Venous: 1.52 mmol/L (ref 0.5–2.2)

## 2013-10-13 LAB — I-STAT TROPONIN, ED: Troponin i, poc: 0.02 ng/mL (ref 0.00–0.08)

## 2013-10-13 LAB — PROTIME-INR
INR: 2.15 — AB (ref 0.00–1.49)
PROTHROMBIN TIME: 23.3 s — AB (ref 11.6–15.2)

## 2013-10-13 LAB — AMMONIA: AMMONIA: 70 umol/L — AB (ref 11–60)

## 2013-10-13 LAB — GLUCOSE, CAPILLARY: Glucose-Capillary: 127 mg/dL — ABNORMAL HIGH (ref 70–99)

## 2013-10-13 LAB — TSH: TSH: 1.38 u[IU]/mL (ref 0.350–4.500)

## 2013-10-13 LAB — ETHANOL: Alcohol, Ethyl (B): <11 mg/dL (ref 0–11)

## 2013-10-13 MED ORDER — PANTOPRAZOLE SODIUM 40 MG PO TBEC
40.0000 mg | DELAYED_RELEASE_TABLET | Freq: Every day | ORAL | Status: DC
  Administered 2013-10-14 – 2013-10-16 (×3): 40 mg via ORAL
  Filled 2013-10-13 (×3): qty 1

## 2013-10-13 MED ORDER — LORAZEPAM 0.5 MG PO TABS
0.5000 mg | ORAL_TABLET | Freq: Two times a day (BID) | ORAL | Status: DC
  Administered 2013-10-13 – 2013-10-16 (×6): 0.5 mg via ORAL
  Filled 2013-10-13 (×6): qty 1

## 2013-10-13 MED ORDER — FUROSEMIDE 20 MG PO TABS
20.0000 mg | ORAL_TABLET | Freq: Every day | ORAL | Status: DC
  Administered 2013-10-14 – 2013-10-16 (×3): 20 mg via ORAL
  Filled 2013-10-13 (×3): qty 1

## 2013-10-13 MED ORDER — ASPIRIN 325 MG PO TABS: 81.0000 mg | ORAL_TABLET | Freq: Every day | ORAL | Status: DC

## 2013-10-13 MED ORDER — ONDANSETRON HCL 4 MG PO TABS: 4.0000 mg | ORAL_TABLET | Freq: Four times a day (QID) | ORAL | Status: DC | PRN

## 2013-10-13 MED ORDER — FLUOXETINE HCL 20 MG PO CAPS
40.0000 mg | ORAL_CAPSULE | Freq: Every morning | ORAL | Status: DC
  Administered 2013-10-14 – 2013-10-16 (×3): 40 mg via ORAL
  Filled 2013-10-13 (×3): qty 2

## 2013-10-13 MED ORDER — ACETAMINOPHEN 325 MG PO TABS
650.0000 mg | ORAL_TABLET | Freq: Four times a day (QID) | ORAL | Status: DC | PRN
  Administered 2013-10-15 – 2013-10-16 (×2): 650 mg via ORAL
  Filled 2013-10-13 (×2): qty 2

## 2013-10-13 MED ORDER — PENTOSAN POLYSULFATE SODIUM 100 MG PO CAPS
200.0000 mg | ORAL_CAPSULE | Freq: Two times a day (BID) | ORAL | Status: DC
  Administered 2013-10-13 – 2013-10-15 (×5): 200 mg via ORAL
  Filled 2013-10-13 (×8): qty 2

## 2013-10-13 MED ORDER — ARIPIPRAZOLE 10 MG PO TABS
10.0000 mg | ORAL_TABLET | Freq: Every day | ORAL | Status: DC
  Administered 2013-10-13 – 2013-10-15 (×3): 10 mg via ORAL
  Filled 2013-10-13 (×4): qty 1

## 2013-10-13 MED ORDER — SODIUM CHLORIDE 0.9 % IJ SOLN
3.0000 mL | Freq: Two times a day (BID) | INTRAMUSCULAR | Status: DC
  Administered 2013-10-13 – 2013-10-16 (×5): 3 mL via INTRAVENOUS

## 2013-10-13 MED ORDER — ONDANSETRON HCL 4 MG/2ML IJ SOLN: 4.0000 mg | Freq: Four times a day (QID) | INTRAMUSCULAR | Status: DC | PRN

## 2013-10-13 MED ORDER — SODIUM CHLORIDE 0.9 % IV SOLN: INTRAVENOUS | Status: DC

## 2013-10-13 MED ORDER — MESALAMINE 1.2 G PO TBEC
1.2000 g | DELAYED_RELEASE_TABLET | Freq: Two times a day (BID) | ORAL | Status: DC
  Administered 2013-10-13 – 2013-10-15 (×5): 1.2 g via ORAL
  Filled 2013-10-13 (×8): qty 1

## 2013-10-13 MED ORDER — INSULIN ASPART 100 UNIT/ML ~~LOC~~ SOLN: 0.0000 [IU] | Freq: Three times a day (TID) | SUBCUTANEOUS | Status: DC

## 2013-10-13 MED ORDER — ASPIRIN 81 MG PO CHEW
81.0000 mg | CHEWABLE_TABLET | Freq: Every day | ORAL | Status: DC
  Administered 2013-10-13: 81 mg via ORAL
  Filled 2013-10-13: qty 1

## 2013-10-13 MED ORDER — POLYSACCHARIDE IRON COMPLEX 150 MG PO CAPS
150.0000 mg | ORAL_CAPSULE | Freq: Two times a day (BID) | ORAL | Status: DC
  Administered 2013-10-13 – 2013-10-16 (×6): 150 mg via ORAL
  Filled 2013-10-13 (×7): qty 1

## 2013-10-13 MED ORDER — MORPHINE SULFATE 2 MG/ML IJ SOLN: 2.0000 mg | INTRAMUSCULAR | Status: DC | PRN

## 2013-10-13 MED ORDER — LACTULOSE 10 GM/15ML PO SOLN
30.0000 g | Freq: Every day | ORAL | Status: DC
  Administered 2013-10-13 – 2013-10-14 (×2): 30 g via ORAL
  Filled 2013-10-13 (×2): qty 45

## 2013-10-13 MED ORDER — INSULIN ASPART 100 UNIT/ML ~~LOC~~ SOLN: 0.0000 [IU] | Freq: Every day | SUBCUTANEOUS | Status: DC

## 2013-10-13 MED ORDER — TRAZODONE HCL 50 MG PO TABS
50.0000 mg | ORAL_TABLET | Freq: Every day | ORAL | Status: DC
  Administered 2013-10-13 – 2013-10-15 (×3): 50 mg via ORAL
  Filled 2013-10-13 (×4): qty 1

## 2013-10-13 MED ORDER — HEPARIN SODIUM (PORCINE) 5000 UNIT/ML IJ SOLN
5000.0000 [IU] | Freq: Three times a day (TID) | INTRAMUSCULAR | Status: DC
  Administered 2013-10-13 – 2013-10-14 (×2): 5000 [IU] via SUBCUTANEOUS
  Filled 2013-10-13 (×5): qty 1

## 2013-10-13 MED ORDER — ACETAMINOPHEN 650 MG RE SUPP: 650.0000 mg | Freq: Four times a day (QID) | RECTAL | Status: DC | PRN

## 2013-10-13 NOTE — Telephone Encounter (Signed)
Sabrina [name], from Dr.Tabori office calling to let Dr. Pearlean Brownie know that the pt was sent to ER because she was having some problems while at her OV. Sabrina [name] also stated that they would let them know at the ER to page Dr. Pearlean Brownie. FYI

## 2013-10-13 NOTE — Progress Notes (Signed)
Pre visit review using our clinic review tool, if applicable. No additional management support is needed unless otherwise documented below in the visit note. 

## 2013-10-13 NOTE — ED Notes (Signed)
Lactic acid results given to Dr. Rancour 

## 2013-10-13 NOTE — Progress Notes (Signed)
Unit CM UR Completed by [facility] ED CM  W. Shakeda Pearse RN  

## 2013-10-13 NOTE — Consult Note (Signed)
Neurology Consultation Reason for Consult: Altered mental status Referring Physician: Rancour, S  CC: Altered Mental status  History is obtained from:Patient  HPI: Sabrina [name] is a 58 y.o. female with a history of problems with balance and speech difficulties for some time who found herself to be significantly worse on awakening at 7:30 am. She has been confused and states that she feels that she is falling to the right. She awoke around 4:30 am and was at that time at her normal compromised level of functioning.   She saw Dr. Pearlean Brownie 2 days ago who was concerned about her falls and has ordered an MRI brain and Cspine but this has yet to be performed.    LKW: 4:30 am tpa given?: no, outside of window.     ROS: A 14 point ROS was performed and is negative except as noted in the HPI.  Past Medical History  Diagnosis Date  . Arthritis   . Allergic rhinitis   . Colitis   . Degenerative disc disease   . Hyperlipidemia   . Hypertension   . Celiac disease   . Bipolar disorder   . Macular degeneration   . Interstitial cystitis   . Diabetes mellitus without complication   . Anemia, iron deficiency 10/30/2012  . Heart murmur   . Anxiety   . Depression   . Liver disease   . Gastric ulcer   . TIA (transient ischemic attack) 06/2013    Family History: Htn, hpl, dm   Social History: Tob: denies  Exam: Current vital signs: There were no vitals taken for this visit. Vital signs in last 24 hours: Temp:  [98.2 F (36.8 C)] 98.2 F (36.8 C) (04/16 1104) Pulse Rate:  [78] 78 (04/16 1104) Resp:  [16] 16 (04/16 1104) BP: (120)/(72) 120/72 mmHg (04/16 1104) SpO2:  [97 %] 97 % (04/16 1104)  General: in bed, NAD CV: RRR Mental Status: Patient is awake, alert, oriented to person, place, month, year. She is very slow to respond to questions, but is able to respond.  Immediate and remote memory are intact. Patient is able to give a clear and coherent history. No signs of  aphasia or neglect Cranial Nerves: II: Visual Fields are full. Pupils are equal, round, and reactive to light.  Discs are difficult to visualize. III,IV, VI: EOMI without ptosis or diploplia.  V: Facial sensation is symmetric to temperature VII: Facial movement is symmetric.  VIII: hearing is intact to voice X: Uvula elevates symmetrically XI: Shoulder shrug is symmetric. XII: tongue is midline without atrophy or fasciculations.  Motor: Tone is normal. Bulk is normal. 5/5 strength was present in bilateral arms, she has mild lower extremity drift bilaterally, 4+/5 to confrontation and symmetric.  Sensory: Sensation is symmetric to light touch and temperature in the arms and decreased to pin in the left leg.  Deep Tendon Reflexes: 2+ and symmetric in the biceps and patellae.  Cerebellar: FNF slow but intact bilaterally.  Gait: Not assessed due to acute nature of evaluation and multiple medical monitors in ED setting.  I have reviewed labs in epic and the results pertinent to this consultation are: Elevated INR   I have reviewed the images obtained: MRI brain - bilateral T1 hyperintensities in the BG.   Impression: 58 yo F with progressive gait problems, confusion in the setting of fiver dysfunction and celiac disease. She has a markedly elevated ANA and there is question of autoimmune hepatitis.   Given her progressive symptoms, I do  wonder with her celiac disease about dietary deficiency. Also, hepatic encephalopathy may be playing a role given an elevated ammonia level.   Recommendations: 1) Check serum vitamins B1, B3, B6, B12, E levels.  2) TSH, RPR 2) Would treat for possible hepatic encephalopathy.  3) will continue to follow.    Ritta SlotMcNeill Sherie Dobrowolski, MD [facility] [phone]  If 7pm- 7am, please page neurology on call as listed in AMION.

## 2013-10-13 NOTE — ED Notes (Signed)
Pt in via as a code stroke, pt woke up at 0430 this am and states she was at her normal at that time, ambulated to bathroom without difficulty, states she went back to bed, later woke back up at 7am and was fallin to the right side, went to her PMD office and they noted left sided facial droop, stated that patient was slow to answer questions and were concerned that she was confused, pt alert and oriented up on arrival, met by stroke team upon arrival and taken to CT, IV established en route, 20g in right hand, BP en route 120/60 and 147/71- CBG 158

## 2013-10-13 NOTE — H&P (Signed)
[facility] History and Physical  Sabrina QuailCynthia Stacey NGE:[id number] DOB: 1955-09-19 DOA: 10/13/2013  Referring physician: Emergency Department PCP: Neena RhymesKatherine Tabori, MD  Specialists:   Chief Complaint: Weakness  HPI: Sabrina [name] is a 58 y.o. female  With a hx of DM, htn, hld, celiac disease who presents to the ED with new onset R sided weakness and listing to the R on ambulation. The patient has been seen recently by Neurology as an outpatient for intermittent gait disturbances and tremors. On the morning of hospital admission, the patient noted new onset R sided weakness with leaning to the R on ambulation. She subsequently presented to the ED where a 1.5cm asymmetric hypodensity was noted in the R cerebellum. Neurology was consulted and the hospitalists consulted for admission.  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 Admits to feeling very depressed recently  Review of Systems:   Per above, the remainder of the 10pt ros reviewed and are neg  Past Medical History  Diagnosis Date  . Arthritis   . Allergic rhinitis   . Colitis   . Degenerative disc disease   . Hyperlipidemia   . Hypertension   . Celiac disease   . Bipolar disorder   . Macular degeneration   . Interstitial cystitis   . Diabetes mellitus without complication   . Anemia, iron deficiency 10/30/2012  . Heart murmur   . Anxiety   . Depression   . Liver disease   . Gastric ulcer   . TIA (transient ischemic attack) 06/2013   Past Surgical History  Procedure Laterality Date  . Tubal ligation    . Appendectomy    . Lumbar laminectomy    . Carpal tunnel release      bilateral  . Cesarean section    . Cysto/ hod/ instillatio clorpactin  07-03-2008  &  06-09-2001    INTERSTITIAL CYSTITIS  . Shoulder arthroscopy w/ rotator cuff repair  01-25-2008    LEFT SHOULDER /    INCLUDING CAPSULECTOMY WITH DEBRIDEMENT/  SAD WITH RESECTION OF AC  . Right shoulder arthroscopy/ open resection distal clavicle/ debridement adhesive capsulitis/ rotator cuff repair  08-30-2003  . Tonsillectomy and adenoidectomy    . Transthoracic echocardiogram  03-28-2011    NORMAL LVSF/ EF 60-65%/ VERY MILD AORTIC STENOSIS, TRIVIAL REGURG./ MILDLY DILATED LEFT ATRIUM  . Cystoscopy  11/26/2011    Procedure: CYSTOSCOPY;  Surgeon: Valetta Fulleravid S Grapey, MD;  [facility];  Service: Urology;  Laterality: N/A;  clorpactin  in bladder  . 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 Cholecystectomy     Social History:  reports that she has never smoked. She has never used smokeless tobacco. She reports that she does not drink alcohol or use illicit drugs.  where does patient live--home, ALF, SNF? and with whom if at home?  Can patient participate in ADLs?  Allergies  Allergen Reactions  . Sulfonamide Derivatives     Classic angioedema reaction  . Wheat Bran     Celiac's disease    Family History  Problem Relation Age of Onset  . Diabetes Mother   . Hypertension Mother   . Diabetes Brother   . Hypertension Brother   . Hyperlipidemia Brother   .  Hypertension    . Colon cancer Neg Hx     (be sure to complete)  Prior to Admission medications   Medication Sig Start Date End Date Taking? Authorizing Provider  ARIPiprazole (ABILIFY) 10 MG tablet Take 10 mg by mouth at bedtime.    Yes Historical Provider, MD  aspirin 81 MG tablet Take 1 tablet (81 mg total) by mouth daily. 07/23/13  Yes Clanford Cyndie Mull, MD  cetirizine (ZYRTEC) 10 MG tablet Take 10 mg by mouth daily.   Yes Historical Provider, MD  FLUoxetine (PROZAC) 40 MG capsule Take 40 mg by mouth every morning.    Yes Historical Provider, MD  furosemide (LASIX) 20 MG tablet Take 20 mg by  mouth daily.   Yes Historical Provider, MD  iron polysaccharides (POLY-IRON 150) 150 MG capsule Take 1 capsule (150 mg total) by mouth 2 (two) times daily. 09/05/13  Yes Hilarie Fredrickson, MD  Loperamide HCl (IMODIUM PO) Take 2 tablets by mouth 2 (two) times daily as needed (diarrhea).   Yes Historical Provider, MD  LORazepam (ATIVAN) 0.5 MG tablet Take 0.5 mg by mouth 2 (two) times daily.    Yes Historical Provider, MD  mesalamine (LIALDA) 1.2 G EC tablet Take 1.2 g by mouth 2 (two) times daily.   Yes Historical Provider, MD  Multiple Vitamins-Minerals (MULTIVITAMIN WITH MINERALS) tablet Take 1 tablet by mouth daily.   Yes Historical Provider, MD  omeprazole (PRILOSEC) 40 MG capsule Take 40 mg by mouth daily.   Yes Historical Provider, MD  pentosan polysulfate (ELMIRON) 100 MG capsule Take 200 mg by mouth 2 (two) times daily.    Yes Historical Provider, MD  traZODone (DESYREL) 50 MG tablet Take 50 mg by mouth at bedtime.   Yes Historical Provider, MD   Physical Exam: Filed Vitals:   10/13/13 1237 10/13/13 1300 10/13/13 1330  BP: 149/58 132/70 135/66  Pulse: 76 79 77  Temp: 98 F (36.7 C)    TempSrc: Oral    Resp: 20 14 15   Weight: 71.215 kg (157 lb)    SpO2: 100% 97% 97%     General:  Awake, in nad  Eyes: PERRL B  ENT: membranes moist, dentition fair  Neck: trachea midline, neck supple  Cardiovascular: regular, s1, s2, systolic murmur  Respiratory: normal resp effort, no wheezing  Abdomen: soft, nondistended  Skin: normal skin turgor, no abnormal skin lesions seen  Musculoskeletal: perfused, no clubbing  Psychiatric: mood/affect normal // no auditory/visual hallucinations  Neurologic: R sided facial droop on my exam (family believes this is baseline), 5/5 strength throughout, sensation intact throughout  Labs on Admission:  Basic Metabolic Panel:  Recent Labs Lab 10/07/13 1615 10/13/13 1216 10/13/13 1226  NA 140 137 143  K 3.6* 3.5* 3.3*  CL 105 104 104  CO2 25 21   --   GLUCOSE 68* 135* 135*  BUN 5* 5* <3*  CREATININE 0.55 0.51 0.60  CALCIUM 8.7 8.3*  --    Liver Function Tests:  Recent Labs Lab 10/07/13 1615 10/13/13 1216  AST 56* 50*  ALT 24 19  ALKPHOS 109 111  BILITOT 1.7* 2.0*  PROT 8.3 8.1  ALBUMIN 2.6* 2.5*   No results found for this basename: LIPASE, AMYLASE,  in the last 168 hours  Recent Labs Lab 10/13/13 1322  AMMONIA 70*   CBC:  Recent Labs Lab 10/07/13 1358 10/13/13 1216 10/13/13 1226  WBC 9.5 8.3  --   NEUTROABS  --  3.9  --  HGB 11.6* 12.2 14.3  HCT 35.8* 34.2* 42.0  MCV 91.6 87.2  --   PLT  --  226  --    Cardiac Enzymes:  Recent Labs Lab 10/07/13 1615  TROPONINI <0.30    BNP (last 3 results) No results found for this basename: PROBNP,  in the last 8760 hours CBG:  Recent Labs Lab 10/07/13 1616 10/07/13 1704  GLUCAP 60* 87    Radiological Exams on Admission: Ct Head Wo Contrast  10/13/2013   CLINICAL DATA:  Shuffling gait, increased falls, tremors and blurry vision. Increased confusion. Code stroke.  EXAM: CT HEAD WITHOUT CONTRAST  TECHNIQUE: Contiguous axial images were obtained from the base of the skull through the vertex without contrast.  COMPARISON:  10/07/2013  FINDINGS: No visible hemorrhage or hydrocephalus.  No extra-axial fluid.  Equivocal 1.5 cm diameter asymmetric hypodensity right cerebellum (image 8 series 2) could represent acute stroke in the appropriate clinical setting. It is possible this appearance has progressed compared with the prior normal study of 10/07/2013. Hounsfield artifact could produce this appearance however. No definite signs of vertebral or basilar hyperattenuation to suggest posterior circulation thrombosis.  No hemispheric infarct. No mass lesion or midline shift. Calvarium intact. Unremarkable orbits, sinuses, and mastoids.  IMPRESSION: Cannot exclude acute right cerebellar infarct. No intracranial hemorrhage or mass lesion.  Critical Value/emergent results  were called by telephone at the time of interpretation on 10/13/2013 at 12:33 PM to Dr. Amada Jupiter who verbally acknowledged these results.   Electronically Signed   By: Davonna Belling M.D.   On: 10/13/2013 12:35   Dg Chest Portable 1 View  10/13/2013   CLINICAL DATA:  Stroke symptoms  EXAM: PORTABLE CHEST - 1 VIEW  COMPARISON:  DG CHEST 1V PORT dated 10/07/2013  FINDINGS: The lungs are slightly less well inflated today than on the earlier study. There is no focal infiltrate. No area of atelectasis is demonstrated. The cardiopericardial silhouette remains enlarged. The pulmonary vascularity is not engorged. There is minimal stable prominence of the interstitial markings. There is no pleural effusion.  IMPRESSION: There is mild bilateral pulmonary hypoinflation. There is no definite evidence of pneumonia nor atelectasis nor CHF.   Electronically Signed   By: David  [place]   On: 10/13/2013 13:46    EKG: Independently reviewed. NSR  Assessment/Plan Principal Problem:   CVA (cerebral infarction) Active Problems:   DIABETES MELLITUS   HYPERLIPIDEMIA   ANXIETY DEPRESSION   HYPERTENSION   CROHN'S DISEASE-LARGE INTESTINE   BIPOLAR AFFECTIVE DISORDER   Aortic stenosis   Stress at work   1. Questionable CVA 1. New findings on CT noted 2. Neurology consulted 3. Will admit to med-tele 4. Cont ASA for now. 5. Will order MRI, echo, carotid dopplers 2. DM 1. Cont on SSI 2. Check a1c 3. HLD 1. Cont on statin 2. Check lipids 4. HTN 1. BP stable 5. Anxiety/depression/bipolar 1. Cont home meds for now 2. Per above, pt admits to much stress in life currently 6. Aortic stenosis 1. Appears stable 2. Echo ordered 3. Murmur on exam 7. DVT prophylaxis 1. Heparin subQ   Code Status: Full (must indicate code status--if unknown or must be presumed, indicate so) Family Communication: Pt and family in room (indicate person spoken with, if applicable, with phone number if by telephone) Disposition  Plan: Pending (indicate anticipated LOS)  Time spent:  Jerald Kief [facility] Pager [phone]  If 7PM-7AM, please contact night-coverage [email or web address] Password [facility] 10/13/2013, 3:05 PM

## 2013-10-13 NOTE — Assessment & Plan Note (Signed)
Recurrent.  Pt currently having confusion, difficulty speaking, weakness, leaning to right.  Pt's neuro exam has changed from 4:30 this AM per husband's report.  Family agrees that her current condition is MUCH different than her presentation at Neurology on Tuesday.  Concern for CVA.  EMS called and told to come w/ lights and sirens.  Pt and family agreeable.

## 2013-10-13 NOTE — Code Documentation (Signed)
58 year old presents to [facility] from MD office as code stroke.  Patient reports she was awake at 0430 this AM and went to the bathroom.  At that time she was at her normal baseline.  At 0730 when she awakened she noticed some some leaning to the right weakness and loss of balance.  She went to her MD office where they called EMS who called the code stroke. EMS reports she also fell last Friday with loss of balance issues and was seen in ED.   On arrival to ED she is alert - speech is normal - just slow to respond.  She has minor right facial palsy and equal drifts with both legs.  She has also has mild sensory loss right leg.  Her NIHSS is 4.  Her EPIC chart reveals a visit with Dr. Pearlean Brownie on 10/11/2013 with similar symptoms.  At that time he ordered repeat MRI which has not been done.  She is outside tPA window.  Has had recent medicine change with discontinuation of Lamictal.

## 2013-10-13 NOTE — Telephone Encounter (Signed)
Patient dropped off FMLA paperwork. Forms filled out as much as possible, billing sheet attached and placed in Dr. Rennis Goldenabori's folder. JG//CMA

## 2013-10-13 NOTE — ED Provider Notes (Signed)
CSN: [id number]     Arrival date & time 10/13/13  1211 History   First MD Initiated Contact with Patient 10/13/13 1218     Chief Complaint  Patient presents with  . Code Stroke    @EDPCLEARED @ (Consider location/radiation/quality/duration/timing/severity/associated sxs/prior Treatment) HPI Comments: Patient brought in as a code stroke with difficulty walking and off-balance feeling started at 4:30 this morning. She fell as she was going to fall to her right side. Patient is slow in her speech pattern which is a change. She was seen by her PMD who noted facial droop and sent her to the ED. She is alert and oriented x3. Code stroke called by EMS. Notably patient saw her neurologist 2 days ago for ongoing issues and possible Parkinson's disease. She denies any headache, chest pain, back pain abdominal pain, nausea or vomiting.  The history is provided by the patient and the EMS personnel. The history is limited by the condition of the patient.    Past Medical History  Diagnosis Date  . Arthritis   . Allergic rhinitis   . Colitis   . Degenerative disc disease   . Hyperlipidemia   . Hypertension   . Celiac disease   . Bipolar disorder   . Macular degeneration   . Interstitial cystitis   . Diabetes mellitus without complication   . Anemia, iron deficiency 10/30/2012  . Heart murmur   . Anxiety   . Depression   . Liver disease   . Gastric ulcer   . TIA (transient ischemic attack) 06/2013   Past Surgical History  Procedure Laterality Date  . Tubal ligation    . Appendectomy    . Lumbar laminectomy    . Carpal tunnel release      bilateral  . Cesarean section    . Cysto/ hod/ instillatio clorpactin  07-03-2008  &  06-09-2001    INTERSTITIAL CYSTITIS  . Shoulder arthroscopy w/ rotator cuff repair  01-25-2008    LEFT SHOULDER /   INCLUDING CAPSULECTOMY WITH DEBRIDEMENT/  SAD WITH RESECTION OF AC  . Right shoulder arthroscopy/ open resection distal clavicle/ debridement adhesive  capsulitis/ rotator cuff repair  08-30-2003  . Tonsillectomy and adenoidectomy    . Transthoracic echocardiogram  03-28-2011    NORMAL LVSF/ EF 60-65%/ VERY MILD AORTIC STENOSIS, TRIVIAL REGURG./ MILDLY DILATED LEFT ATRIUM  . Cystoscopy  11/26/2011    Procedure: CYSTOSCOPY;  Surgeon: Valetta Fuller, MD;  Location: [facility];  Service: Urology;  Laterality: N/A;  clorpactin  in bladder  . 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 Cholecystectomy     Family History  Problem Relation Age of Onset  . Diabetes Mother   . Hypertension Mother   . Diabetes Brother   . Hypertension Brother   . Hyperlipidemia Brother   . Hypertension    . Colon cancer Neg Hx    History  Substance Use Topics  . Smoking status: Never Smoker   . Smokeless tobacco: Never Used  . Alcohol Use: No   OB History   Grav Para Term Preterm Abortions TAB SAB Ect Mult Living                 Review of Systems  Unable to perform ROS: Acuity of condition      Allergies  Sulfonamide derivatives and Wheat bran  Home Medications  Prior to Admission medications   Medication Sig Start Date End Date Taking? Authorizing Provider  ARIPiprazole (ABILIFY) 10 MG tablet Take 10 mg by mouth at bedtime.     Historical Provider, MD  aspirin 81 MG tablet Take 1 tablet (81 mg total) by mouth daily. 07/23/13   Clanford Cyndie Mull, MD  fexofenadine (ALLEGRA) 180 MG tablet Take 180 mg by mouth every morning.     Historical Provider, MD  FLUoxetine (PROZAC) 40 MG capsule Take 40 mg by mouth every morning.     Historical Provider, MD  furosemide (LASIX) 20 MG tablet TAKE ONE TABLET BY MOUTH ONE TIME DAILY  08/12/13   Hilarie Fredrickson, MD  iron polysaccharides (POLY-IRON 150) 150 MG capsule Take 1 capsule (150 mg total) by mouth 2 (two) times daily. 09/05/13   Hilarie Fredrickson, MD  LORazepam  (ATIVAN) 0.5 MG tablet Take 0.5 mg by mouth daily.     Historical Provider, MD  mesalamine (LIALDA) 1.2 G EC tablet Take 1 tablet (1.2 g total) by mouth 2 (two) times daily. 09/05/13   Hilarie Fredrickson, MD  omeprazole (PRILOSEC) 40 MG capsule Take 1 capsule (40 mg total) by mouth 2 (two) times daily. 09/05/13   Hilarie Fredrickson, MD  pentosan polysulfate (ELMIRON) 100 MG capsule Take 200 mg by mouth 2 (two) times daily.     Historical Provider, MD  traZODone (DESYREL) 50 MG tablet Take 50 mg by mouth at bedtime.    Historical Provider, MD   BP 123/57  Pulse 71  Temp(Src) 98.1 F (36.7 C) (Oral)  Resp 16  Wt 157 lb (71.215 kg)  SpO2 100% Physical Exam  Constitutional: She is oriented to person, place, and time. She appears well-developed and well-nourished. No distress.   She is very slow to respond to questions, but is able to respond  HENT:  Head: Normocephalic and atraumatic.  Mouth/Throat: Oropharynx is clear and moist. No oropharyngeal exudate.  Eyes: Conjunctivae and EOM are normal. Pupils are equal, round, and reactive to light.  Neck: Normal range of motion. Neck supple.  Cardiovascular: Normal rate, regular rhythm and normal heart sounds.   No murmur heard. Pulmonary/Chest: Effort normal and breath sounds normal. No respiratory distress.  Abdominal: Soft. There is no tenderness. There is no rebound and no guarding.  Musculoskeletal: Normal range of motion. She exhibits no edema and no tenderness.  Neurological: She is alert and oriented to person, place, and time. A cranial nerve deficit is present. Coordination abnormal.  Equal strength in upper extremities. Lower extremities appear symmetrically weaker.    Skin: Skin is warm.    ED Course  Procedures (including critical care time) Labs Review Labs Reviewed  PROTIME-INR - Abnormal; Notable for the following:    Prothrombin Time 23.3 (*)    INR 2.15 (*)    All other components within normal limits  APTT - Abnormal; Notable for the  following:    aPTT 41 (*)    All other components within normal limits  CBC - Abnormal; Notable for the following:    HCT 34.2 (*)    RDW 16.6 (*)    All other components within normal limits  DIFFERENTIAL - Abnormal; Notable for the following:    Monocytes Relative 13 (*)    Monocytes Absolute 1.1 (*)    All other components within normal limits  COMPREHENSIVE METABOLIC PANEL - Abnormal; Notable for the following:    Potassium 3.5 (*)    Glucose, Bld 135 (*)  BUN 5 (*)    Calcium 8.3 (*)    Albumin 2.5 (*)    AST 50 (*)    Total Bilirubin 2.0 (*)    All other components within normal limits  AMMONIA - Abnormal; Notable for the following:    Ammonia 70 (*)    All other components within normal limits  I-STAT CHEM 8, ED - Abnormal; Notable for the following:    Potassium 3.3 (*)    BUN <3 (*)    Glucose, Bld 135 (*)    All other components within normal limits  ETHANOL  URINE RAPID DRUG SCREEN (HOSP PERFORMED)  URINALYSIS, ROUTINE W REFLEX MICROSCOPIC  COMPREHENSIVE METABOLIC PANEL  CBC  VITAMIN B1  VITAMIN B6  VITAMIN B12  VITAMIN E  MISCELLANEOUS TEST  TSH  RPR  I-STAT TROPOININ, ED  I-STAT TROPOININ, ED  I-STAT CG4 LACTIC ACID, ED    Imaging Review Ct Head Wo Contrast  10/13/2013   CLINICAL DATA:  Shuffling gait, increased falls, tremors and blurry vision. Increased confusion. Code stroke.  EXAM: CT HEAD WITHOUT CONTRAST  TECHNIQUE: Contiguous axial images were obtained from the base of the skull through the vertex without contrast.  COMPARISON:  10/07/2013  FINDINGS: No visible hemorrhage or hydrocephalus.  No extra-axial fluid.  Equivocal 1.5 cm diameter asymmetric hypodensity right cerebellum (image 8 series 2) could represent acute stroke in the appropriate clinical setting. It is possible this appearance has progressed compared with the prior normal study of 10/07/2013. Hounsfield artifact could produce this appearance however. No definite signs of vertebral or  basilar hyperattenuation to suggest posterior circulation thrombosis.  No hemispheric infarct. No mass lesion or midline shift. Calvarium intact. Unremarkable orbits, sinuses, and mastoids.  IMPRESSION: Cannot exclude acute right cerebellar infarct. No intracranial hemorrhage or mass lesion.  Critical Value/emergent results were called by telephone at the time of interpretation on 10/13/2013 at 12:33 PM to Dr. Amada Jupiter who verbally acknowledged these results.   Electronically Signed   By: Davonna Belling M.D.   On: 10/13/2013 12:35   Mr Brain Wo Contrast  10/13/2013   CLINICAL DATA:  58 year old female with new onset right side weakness and falling to the right. Gait disturbance and tremor. Altered mental status. Initial encounter.  EXAM: MRI HEAD WITHOUT CONTRAST  MRI CERVICAL SPINE WITHOUT CONTRAST  TECHNIQUE: Multiplanar, multiecho pulse sequences of the brain and surrounding structures, and cervical spine, to include the craniocervical junction and cervicothoracic junction, were obtained without intravenous contrast.  COMPARISON:  Brain MRI and MRA 07/21/2013. [facility] brain MRI 03/03/2009.  FINDINGS: MRI HEAD FINDINGS  Stable cerebral volume. Major intracranial vascular flow voids are stable. No restricted diffusion to suggest acute infarction. No midline shift, mass effect, evidence of mass lesion, ventriculomegaly, extra-axial collection or acute intracranial hemorrhage. Cervicomedullary junction and pituitary are within normal limits. Negative visualized cervical spine.  As noted in January, there is increased intrinsic T1 signal of the globus palate eye. Elsewhere largely unremarkable for age gray and white matter signal throughout the brain (minimal nonspecific cerebral white matter signal changes). Probable developmental venous anomaly in the left cerebellum incidentally re- identified.  Visualized orbit soft tissues are within normal limits. Visible internal auditory structures appear  normal. Visualized paranasal sinuses and mastoids are clear. Trace retained secretions in the nasopharynx. Visualized scalp soft tissues are within normal limits.  MRI CERVICAL SPINE FINDINGS  Study is intermittently degraded by motion artifact despite repeated imaging attempts.  Preserved cervical vertebral height and alignment. No marrow  edema or evidence of acute osseous abnormality.  Cervicomedullary junction is within normal limits. Spinal cord signal is within normal limits at all visualized levels. Normal patency of the cervical thecal sac. There is Mild disc bulging at C5-C6. No cervical neural foraminal stenosis identified.  Negative paraspinal soft tissues. Major vascular flow voids in the neck are preserved, dominant right vertebral artery.  IMPRESSION: 1.  No acute intracranial abnormality. 2. Continued abnormal T1 signal in the globus pallidi, as can be seen with hepatic insufficiency, cirrhosis, portosystemic shunt, portal vein thrombosis, total parenteral nutrition, and is less often with hyperglycemia or other metabolic disturbances such as Wilson's disease. Correlation with serum ammonia levels and liver function tests is recommended. 3. Negative cervical spine. Minimal/age congruent disc degeneration.   Electronically Signed   By: Augusto Gamble M.D.   On: 10/13/2013 16:57   Mr Cervical Spine Wo Contrast  10/13/2013   CLINICAL DATA:  58 year old female with new onset right side weakness and falling to the right. Gait disturbance and tremor. Altered mental status. Initial encounter.  EXAM: MRI HEAD WITHOUT CONTRAST  MRI CERVICAL SPINE WITHOUT CONTRAST  TECHNIQUE: Multiplanar, multiecho pulse sequences of the brain and surrounding structures, and cervical spine, to include the craniocervical junction and cervicothoracic junction, were obtained without intravenous contrast.  COMPARISON:  Brain MRI and MRA 07/21/2013. [facility] brain MRI 03/03/2009.  FINDINGS: MRI HEAD FINDINGS  Stable cerebral  volume. Major intracranial vascular flow voids are stable. No restricted diffusion to suggest acute infarction. No midline shift, mass effect, evidence of mass lesion, ventriculomegaly, extra-axial collection or acute intracranial hemorrhage. Cervicomedullary junction and pituitary are within normal limits. Negative visualized cervical spine.  As noted in January, there is increased intrinsic T1 signal of the globus palate eye. Elsewhere largely unremarkable for age gray and white matter signal throughout the brain (minimal nonspecific cerebral white matter signal changes). Probable developmental venous anomaly in the left cerebellum incidentally re- identified.  Visualized orbit soft tissues are within normal limits. Visible internal auditory structures appear normal. Visualized paranasal sinuses and mastoids are clear. Trace retained secretions in the nasopharynx. Visualized scalp soft tissues are within normal limits.  MRI CERVICAL SPINE FINDINGS  Study is intermittently degraded by motion artifact despite repeated imaging attempts.  Preserved cervical vertebral height and alignment. No marrow edema or evidence of acute osseous abnormality.  Cervicomedullary junction is within normal limits. Spinal cord signal is within normal limits at all visualized levels. Normal patency of the cervical thecal sac. There is Mild disc bulging at C5-C6. No cervical neural foraminal stenosis identified.  Negative paraspinal soft tissues. Major vascular flow voids in the neck are preserved, dominant right vertebral artery.  IMPRESSION: 1.  No acute intracranial abnormality. 2. Continued abnormal T1 signal in the globus pallidi, as can be seen with hepatic insufficiency, cirrhosis, portosystemic shunt, portal vein thrombosis, total parenteral nutrition, and is less often with hyperglycemia or other metabolic disturbances such as Wilson's disease. Correlation with serum ammonia levels and liver function tests is recommended. 3.  Negative cervical spine. Minimal/age congruent disc degeneration.   Electronically Signed   By: Augusto Gamble M.D.   On: 10/13/2013 16:57   Dg Chest Portable 1 View  10/13/2013   CLINICAL DATA:  Stroke symptoms  EXAM: PORTABLE CHEST - 1 VIEW  COMPARISON:  DG CHEST 1V PORT dated 10/07/2013  FINDINGS: The lungs are slightly less well inflated today than on the earlier study. There is no focal infiltrate. No area of atelectasis is demonstrated. The  cardiopericardial silhouette remains enlarged. The pulmonary vascularity is not engorged. There is minimal stable prominence of the interstitial markings. There is no pleural effusion.  IMPRESSION: There is mild bilateral pulmonary hypoinflation. There is no definite evidence of pneumonia nor atelectasis nor CHF.   Electronically Signed   By: David  Swazil[place]   On: 10/13/2013 13:46     EKG Interpretation   Date/Time:  Thursday October 13 2013 12:42:36 EDT Ventricular Rate:  79 PR Interval:  167 QRS Duration: 98 QT Interval:  423 QTC Calculation: 485 R Axis:   61 Text Interpretation:  Sinus rhythm Probable left ventricular hypertrophy  No significant change was found Confirmed by Manus GunningANCOUR  MD, Zachary Nole [phone]([id number])  on 10/13/2013 1:12:38 PM      MDM   Final diagnoses:  CVA (cerebral infarction)   Progressive gait disturbance with falling to side since 430 AM.  Code stroke on arrival.  Seen with Neurology.  CT head with possible cereballar infarct, no hemorrhage. Patient not tPA candidate 2/2 out of window.  Neurology feels not acute CVA and questions hepatic encephalopahty from liver disease.  Ammonia 70. Patient will be admitted for further stroke workup.     Glynn OctaveStephen Dorann Davidson, MD 10/13/13 [phone]

## 2013-10-13 NOTE — Progress Notes (Signed)
   Subjective:    Patient ID: Sabrina [name], female    DOB: [birth year], 58 y.o.   MRN: [id number]  HPI ER f/u- went to [facility] on 4/10 after falling at work.  Was sent to ER.  Had CT, CXR, EKG all WNL.  Having shuffling gait, increased falls, tremor.  Today is leaning to the R- this is new.  Saw Dr Pearlean BrownieSethi yesterday- it was discussed that this is possibly medication related (Abilify) vs Parkinson's.  Neuro wants MRI.  Dr Evelene CroonKaur has stopped Lamictal, thinking this could be part of the problem.  Pt feels that today her leaning to the R is due to increased weakness.  Having difficulty w/ speech.  Family reports that 2 days ago when using a walker she kept drifting to the R.  Unable to read cell phone display due to blurry vision.  Increased confusion.   Review of Systems For ROS see HPI     Objective:   Physical Exam  Vitals reviewed. Constitutional:  Frail woman appearing older than stated age  HENT:  Head: Normocephalic and atraumatic.  Eyes: Pupils are equal, round, and reactive to light.  Cardiovascular: Normal rate, regular rhythm and intact distal pulses.   Murmur (III/VI blowing systolic murmur) heard. Pulmonary/Chest: Effort normal and breath sounds normal. No respiratory distress. She has no wheezes. She has no rales.  Musculoskeletal: She exhibits edema (R LE edema +1, no edema on L).  Neurological:  Difficulty w/ speech and cognition Leaning to the R- at 4:30am pt was able to walk to bathroom upright and straight, at 7:30am was leaning to the R Brisk, almost hyperreflexic bilaterally  Skin: Skin is warm and dry.          Assessment & Plan:

## 2013-10-14 DIAGNOSIS — R269 Unspecified abnormalities of gait and mobility: Secondary | ICD-10-CM

## 2013-10-14 DIAGNOSIS — K7290 Hepatic failure, unspecified without coma: Principal | ICD-10-CM

## 2013-10-14 DIAGNOSIS — K7460 Unspecified cirrhosis of liver: Secondary | ICD-10-CM

## 2013-10-14 DIAGNOSIS — K7682 Hepatic encephalopathy: Secondary | ICD-10-CM

## 2013-10-14 DIAGNOSIS — I6350 Cerebral infarction due to unspecified occlusion or stenosis of unspecified cerebral artery: Secondary | ICD-10-CM

## 2013-10-14 DIAGNOSIS — R7989 Other specified abnormal findings of blood chemistry: Secondary | ICD-10-CM

## 2013-10-14 DIAGNOSIS — I359 Nonrheumatic aortic valve disorder, unspecified: Secondary | ICD-10-CM

## 2013-10-14 LAB — COMPREHENSIVE METABOLIC PANEL
ALT: 19 U/L (ref 0–35)
AST: 48 U/L — AB (ref 0–37)
Albumin: 2.3 g/dL — ABNORMAL LOW (ref 3.5–5.2)
Alkaline Phosphatase: 94 U/L (ref 39–117)
BUN: 5 mg/dL — AB (ref 6–23)
CO2: 22 mEq/L (ref 19–32)
Calcium: 8 mg/dL — ABNORMAL LOW (ref 8.4–10.5)
Chloride: 110 mEq/L (ref 96–112)
Creatinine, Ser: 0.42 mg/dL — ABNORMAL LOW (ref 0.50–1.10)
GFR calc Af Amer: >90 mL/min (ref >90)
GFR calc non Af Amer: >90 mL/min (ref >90)
Glucose, Bld: 113 mg/dL — ABNORMAL HIGH (ref 70–99)
Potassium: 3.8 mEq/L (ref 3.7–5.3)
Sodium: 141 mEq/L (ref 137–147)
Total Bilirubin: 1.9 mg/dL — ABNORMAL HIGH (ref 0.3–1.2)
Total Protein: 7.4 g/dL (ref 6.0–8.3)

## 2013-10-14 LAB — CBC
HCT: 31.9 % — ABNORMAL LOW (ref 36.0–46.0)
Hemoglobin: 11.5 g/dL — ABNORMAL LOW (ref 12.0–15.0)
MCH: 31.3 pg (ref 26.0–34.0)
MCHC: 36.1 g/dL — ABNORMAL HIGH (ref 30.0–36.0)
MCV: 86.9 fL (ref 78.0–100.0)
Platelets: 201 10*3/uL (ref 150–400)
RBC: 3.67 MIL/uL — AB (ref 3.87–5.11)
RDW: 16.5 % — ABNORMAL HIGH (ref 11.5–15.5)
WBC: 9.2 10*3/uL (ref 4.0–10.5)

## 2013-10-14 LAB — GLUCOSE, CAPILLARY
GLUCOSE-CAPILLARY: 123 mg/dL — AB (ref 70–99)
Glucose-Capillary: 80 mg/dL (ref 70–99)
Glucose-Capillary: 105 mg/dL — ABNORMAL HIGH (ref 70–99)
Glucose-Capillary: 84 mg/dL (ref 70–99)

## 2013-10-14 LAB — RPR

## 2013-10-14 LAB — VITAMIN B12: VITAMIN B 12: 1966 pg/mL — AB (ref 211–911)

## 2013-10-14 MED ORDER — LACTULOSE 10 GM/15ML PO SOLN
15.0000 g | Freq: Every day | ORAL | Status: DC
  Administered 2013-10-15: 15 g via ORAL
  Filled 2013-10-14: qty 30

## 2013-10-14 NOTE — Progress Notes (Signed)
[facility] PROGRESS NOTE  Sabrina [name] UUV:[id number] DOB: 1955-10-06 DOA: 10/13/2013 PCP: Neena Rhymes, MD  Assessment/Plan: 1-Hepatic Encephalopathy; patient presents with gait disturbance, confusion, slurred speech. Symptoms improved with Lactulose. MRI negative for acute stroke.  B-12 1966. RPR non reactive. TSH 1.380.  Ammonia at 70 on admission. Repeat in am.   2-Liver failure; continue with lactulose, lasix. GI inform of admission.   3-Diabetes; last HB A1 at 5.5. Hypoglycemia. Repeat cbg. D 50 as needed. SSI  4-HLD; Avoid statin with liver diseases.   5-Anxiety/depression/bipolar: Cont home meds for now  6-Aortic stenosis; Appears stable.   7-DVT prophylaxis: discontinue heparin, high risk for bleeding. Scd.  8- Crohn's colitis. On mesalamine   Code Status: Full Code.  Family Communication:  Disposition Plan: remain inpatient. PT evaluation.    Consultants:  Neurology  Procedures:  none  Antibiotics:  none  HPI/Subjective: Patient doing better. Alert, speech clear.  omplaining about the diarrhea.   Objective: Filed Vitals:   10/14/13 0613  BP: 130/56  Pulse: 65  Temp: 98.7 F (37.1 C)  Resp: 18    Intake/Output Summary (Last 24 hours) at 10/14/13 1056 Last data filed at 10/13/13 1848  Gross per 24 hour  Intake      0 ml  Output    300 ml  Net   -300 ml   Filed Weights   10/13/13 1237  Weight: 71.215 kg (157 lb)    Exam:   General:  Alert, following command.   Cardiovascular: S 1, S 2 RRR  Respiratory: CTA  Abdomen: BS present, soft, NT  Musculoskeletal: no edema.   Data Reviewed: Basic Metabolic Panel:  Recent Labs Lab 10/07/13 1615 10/13/13 1216 10/13/13 1226 10/14/13 0500  NA 140 137 143 141  K 3.6* 3.5* 3.3* 3.8  CL 105 104 104 110  CO2 25 21  --  22  GLUCOSE 68* 135* 135* 113*  BUN 5* 5* <3* 5*  CREATININE 0.55 0.51 0.60 0.42*  CALCIUM 8.7 8.3*  --  8.0*   Liver Function Tests:  Recent  Labs Lab 10/07/13 1615 10/13/13 1216 10/14/13 0500  AST 56* 50* 48*  ALT 24 19 19   ALKPHOS 109 111 94  BILITOT 1.7* 2.0* 1.9*  PROT 8.3 8.1 7.4  ALBUMIN 2.6* 2.5* 2.3*   No results found for this basename: LIPASE, AMYLASE,  in the last 168 hours  Recent Labs Lab 10/13/13 1322  AMMONIA 70*   CBC:  Recent Labs Lab 10/07/13 1358 10/13/13 1216 10/13/13 1226 10/14/13 0500  WBC 9.5 8.3  --  9.2  NEUTROABS  --  3.9  --   --   HGB 11.6* 12.2 14.3 11.5*  HCT 35.8* 34.2* 42.0 31.9*  MCV 91.6 87.2  --  86.9  PLT  --  226  --  201   Cardiac Enzymes:  Recent Labs Lab 10/07/13 1615  TROPONINI <0.30   BNP (last 3 results) No results found for this basename: PROBNP,  in the last 8760 hours CBG:  Recent Labs Lab 10/07/13 1616 10/07/13 1704 10/13/13 2133 10/14/13 0742  GLUCAP 60* 87 127* 80    No results found for this or any previous visit (from the past 240 hour(s)).   Studies: Ct Head Wo Contrast  10/13/2013   CLINICAL DATA:  Shuffling gait, increased falls, tremors and blurry vision. Increased confusion. Code stroke.  EXAM: CT HEAD WITHOUT CONTRAST  TECHNIQUE: Contiguous axial images were obtained from the base of the skull through the vertex without  contrast.  COMPARISON:  10/07/2013  FINDINGS: No visible hemorrhage or hydrocephalus.  No extra-axial fluid.  Equivocal 1.5 cm diameter asymmetric hypodensity right cerebellum (image 8 series 2) could represent acute stroke in the appropriate clinical setting. It is possible this appearance has progressed compared with the prior normal study of 10/07/2013. Hounsfield artifact could produce this appearance however. No definite signs of vertebral or basilar hyperattenuation to suggest posterior circulation thrombosis.  No hemispheric infarct. No mass lesion or midline shift. Calvarium intact. Unremarkable orbits, sinuses, and mastoids.  IMPRESSION: Cannot exclude acute right cerebellar infarct. No intracranial hemorrhage or mass  lesion.  Critical Value/emergent results were called by telephone at the time of interpretation on 10/13/2013 at 12:33 PM to Dr. Amada Jupiter who verbally acknowledged these results.   Electronically Signed   By: Davonna Belling M.D.   On: 10/13/2013 12:35   Mr Brain Wo Contrast  10/13/2013   CLINICAL DATA:  58 year old female with new onset right side weakness and falling to the right. Gait disturbance and tremor. Altered mental status. Initial encounter.  EXAM: MRI HEAD WITHOUT CONTRAST  MRI CERVICAL SPINE WITHOUT CONTRAST  TECHNIQUE: Multiplanar, multiecho pulse sequences of the brain and surrounding structures, and cervical spine, to include the craniocervical junction and cervicothoracic junction, were obtained without intravenous contrast.  COMPARISON:  Brain MRI and MRA 07/21/2013. [facility] brain MRI 03/03/2009.  FINDINGS: MRI HEAD FINDINGS  Stable cerebral volume. Major intracranial vascular flow voids are stable. No restricted diffusion to suggest acute infarction. No midline shift, mass effect, evidence of mass lesion, ventriculomegaly, extra-axial collection or acute intracranial hemorrhage. Cervicomedullary junction and pituitary are within normal limits. Negative visualized cervical spine.  As noted in January, there is increased intrinsic T1 signal of the globus palate eye. Elsewhere largely unremarkable for age gray and white matter signal throughout the brain (minimal nonspecific cerebral white matter signal changes). Probable developmental venous anomaly in the left cerebellum incidentally re- identified.  Visualized orbit soft tissues are within normal limits. Visible internal auditory structures appear normal. Visualized paranasal sinuses and mastoids are clear. Trace retained secretions in the nasopharynx. Visualized scalp soft tissues are within normal limits.  MRI CERVICAL SPINE FINDINGS  Study is intermittently degraded by motion artifact despite repeated imaging attempts.  Preserved  cervical vertebral height and alignment. No marrow edema or evidence of acute osseous abnormality.  Cervicomedullary junction is within normal limits. Spinal cord signal is within normal limits at all visualized levels. Normal patency of the cervical thecal sac. There is Mild disc bulging at C5-C6. No cervical neural foraminal stenosis identified.  Negative paraspinal soft tissues. Major vascular flow voids in the neck are preserved, dominant right vertebral artery.  IMPRESSION: 1.  No acute intracranial abnormality. 2. Continued abnormal T1 signal in the globus pallidi, as can be seen with hepatic insufficiency, cirrhosis, portosystemic shunt, portal vein thrombosis, total parenteral nutrition, and is less often with hyperglycemia or other metabolic disturbances such as Wilson's disease. Correlation with serum ammonia levels and liver function tests is recommended. 3. Negative cervical spine. Minimal/age congruent disc degeneration.   Electronically Signed   By: Augusto Gamble M.D.   On: 10/13/2013 16:57   Mr Cervical Spine Wo Contrast  10/13/2013   CLINICAL DATA:  58 year old female with new onset right side weakness and falling to the right. Gait disturbance and tremor. Altered mental status. Initial encounter.  EXAM: MRI HEAD WITHOUT CONTRAST  MRI CERVICAL SPINE WITHOUT CONTRAST  TECHNIQUE: Multiplanar, multiecho pulse sequences of the brain and surrounding  structures, and cervical spine, to include the craniocervical junction and cervicothoracic junction, were obtained without intravenous contrast.  COMPARISON:  Brain MRI and MRA 07/21/2013. [facility] brain MRI 03/03/2009.  FINDINGS: MRI HEAD FINDINGS  Stable cerebral volume. Major intracranial vascular flow voids are stable. No restricted diffusion to suggest acute infarction. No midline shift, mass effect, evidence of mass lesion, ventriculomegaly, extra-axial collection or acute intracranial hemorrhage. Cervicomedullary junction and pituitary are  within normal limits. Negative visualized cervical spine.  As noted in January, there is increased intrinsic T1 signal of the globus palate eye. Elsewhere largely unremarkable for age gray and white matter signal throughout the brain (minimal nonspecific cerebral white matter signal changes). Probable developmental venous anomaly in the left cerebellum incidentally re- identified.  Visualized orbit soft tissues are within normal limits. Visible internal auditory structures appear normal. Visualized paranasal sinuses and mastoids are clear. Trace retained secretions in the nasopharynx. Visualized scalp soft tissues are within normal limits.  MRI CERVICAL SPINE FINDINGS  Study is intermittently degraded by motion artifact despite repeated imaging attempts.  Preserved cervical vertebral height and alignment. No marrow edema or evidence of acute osseous abnormality.  Cervicomedullary junction is within normal limits. Spinal cord signal is within normal limits at all visualized levels. Normal patency of the cervical thecal sac. There is Mild disc bulging at C5-C6. No cervical neural foraminal stenosis identified.  Negative paraspinal soft tissues. Major vascular flow voids in the neck are preserved, dominant right vertebral artery.  IMPRESSION: 1.  No acute intracranial abnormality. 2. Continued abnormal T1 signal in the globus pallidi, as can be seen with hepatic insufficiency, cirrhosis, portosystemic shunt, portal vein thrombosis, total parenteral nutrition, and is less often with hyperglycemia or other metabolic disturbances such as Wilson's disease. Correlation with serum ammonia levels and liver function tests is recommended. 3. Negative cervical spine. Minimal/age congruent disc degeneration.   Electronically Signed   By: Augusto Gamble M.D.   On: 10/13/2013 16:57   Dg Chest Portable 1 View  10/13/2013   CLINICAL DATA:  Stroke symptoms  EXAM: PORTABLE CHEST - 1 VIEW  COMPARISON:  DG CHEST 1V PORT dated 10/07/2013   FINDINGS: The lungs are slightly less well inflated today than on the earlier study. There is no focal infiltrate. No area of atelectasis is demonstrated. The cardiopericardial silhouette remains enlarged. The pulmonary vascularity is not engorged. There is minimal stable prominence of the interstitial markings. There is no pleural effusion.  IMPRESSION: There is mild bilateral pulmonary hypoinflation. There is no definite evidence of pneumonia nor atelectasis nor CHF.   Electronically Signed   By: David  [place]   On: 10/13/2013 13:46    Scheduled Meds: . sodium chloride   Intravenous STAT  . ARIPiprazole  10 mg Oral QHS  . FLUoxetine  40 mg Oral q morning - 10a  . furosemide  20 mg Oral Daily  . insulin aspart  0-15 Units Subcutaneous TID WC  . insulin aspart  0-5 Units Subcutaneous QHS  . iron polysaccharides  150 mg Oral BID  . lactulose  30 g Oral Daily  . LORazepam  0.5 mg Oral BID  . mesalamine  1.2 g Oral BID  . pantoprazole  40 mg Oral Daily  . pentosan polysulfate  200 mg Oral BID  . sodium chloride  3 mL Intravenous Q12H  . traZODone  50 mg Oral QHS   Continuous Infusions:   Principal Problem:   CVA (cerebral infarction) Active Problems:   DIABETES MELLITUS  HYPERLIPIDEMIA   ANXIETY DEPRESSION   HYPERTENSION   CROHN'S DISEASE-LARGE INTESTINE   BIPOLAR AFFECTIVE DISORDER   Aortic stenosis   Stress at work    Time spent: 35 minutes.     Sabrina [name]  [facility] Pager [phone]. If 7PM-7AM, please contact night-coverage at [email or web address], password [email or web address] 10/14/2013, 10:56 AM  LOS: 1 day

## 2013-10-14 NOTE — Progress Notes (Signed)
Subjective: Patient is much improved, other than diarrhea  Exam: Filed Vitals:   10/14/13 0613  BP: 130/56  Pulse: 65  Temp: 98.7 F (37.1 C)  Resp: 18   Gen: In bed, NAD MS: Awake, alert, speech is much more fluid and patient is much more interactive. Oriented x 3 QB:VQXIH, EOMI Motor: MAEW, no focal weakness Sensory:intact to LT  Very subtle asterixis is present.  Impression: 58 yo F with several weeks of progressive confusion, tremor, gait unsteadiness who has improved considerably with lactulose. Given this response in the setting of weeks of progressive worsening prior to lactulose, I favor hepatic encephalopathy as the diagnosis.   Recommendations: 1) Would favor continuing to treat as hepatic encephalopathy 2) No further recommendations at this time, please call with any further questions or concerns.   Ritta Slot, MD [facility] [phone]  If 7pm- 7am, please page neurology on call as listed in AMION.

## 2013-10-14 NOTE — Consult Note (Addendum)
[facility] Consult: 12:33 PM 10/14/2013  LOS: 1 day    Referring Provider: Hartley Barefoot Primary Care Physician:  Neena Rhymes, MD Primary Gastroenterologist:  Dr. Marina Goodell.      Reason for Consultation:  Encephalopathy.    HPI: Sabrina [name] is a 58 y.o. female.  Hx mild Crohn's colitis (dx in 2008, no disease 06/2012 on colonoscopy), celiac disease, PUD (gastric ulcer 12/2012).  Cirrhosis (cryptogennic but with obesity, ? NASH; + autoimmune markers, ? AIH) diagnosed at time of splenectomy/cholecystectomy 01/2013. Coagulopathy dates back to 12/2012. On po iron for 10/2012 hx of IDA.  She still works full time as a Museum/gallery exhibitions officer .   Several weeks of progressive confusion, tremor, gait unsteadiness, speech difficulty.  Admitted from ED 4/16 with acute right sided weakness, confusion.  CT head showed right cerebellar hypodensity.  MRI showed abnormal signal at Globus palllidi as can be seen with hepatic insufficiency, cirrhosis, portosystemic shunt, portal vein thrombosis, total parenteral nutrition, and is less often with hyperglycemia or other metabolic disturbances such as Wilson's disease.  LFTs minimally elevated.  Ammonia 70.   PT/INR elevated.   Sxs improved considerably after lactulose added. Neurologist favors hepatic encephalopathy as cause.   Normally has about 3 - 4 formed stools per day.  Tries to comply with celiac diet but not perfect.  Lactulose is causing diarrheal stools, about 6 yesterday and she is incontinent.   Past Medical History  Diagnosis Date  . Arthritis   . Allergic rhinitis   . Colitis   . Degenerative disc disease   . Hyperlipidemia   . Hypertension   . Celiac disease   . Bipolar disorder   . Macular degeneration   . Interstitial cystitis   . Heart murmur     . Anxiety   . Depression   . Liver disease   . Gastric ulcer   . TIA (transient ischemic attack) 06/2013; ?10/13/2013  . Type II diabetes mellitus   . Anemia, iron deficiency 10/30/2012  . History of blood transfusion     "maybe 3-4; related to low counts" (10/13/2013)    Past Surgical History  Procedure Laterality Date  . Appendectomy    . Lumbar laminectomy    . Carpal tunnel release Bilateral   . Cesarean section  1983  . Cysto/ hod/ instillatio clorpactin  07-03-2008  &  06-09-2001    INTERSTITIAL CYSTITIS  . Left shoulder /   including capsulectomy with debridement/  sad with resection of ac Left 01-25-2008  . Right shoulder arthroscopy/ open resection distal clavicle/ debridement adhesive capsulitis/ rotator cuff repair Right 08-30-2003  . Tonsillectomy and adenoidectomy    . Transthoracic echocardiogram  03-28-2011    NORMAL LVSF/ EF 60-65%/ VERY MILD AORTIC STENOSIS, TRIVIAL REGURG./ MILDLY DILATED LEFT ATRIUM  . Cystoscopy  11/26/2011    Procedure: CYSTOSCOPY;  Surgeon: Valetta Fuller, MD;  Location: [facility];  Service: Urology;  Laterality: N/A;  clorpactin  in bladder  . Splenectomy, total N/A 02/23/2013    Procedure: OPEN SPLENECTOMY AND CHOLECYSTECTOMY ;  Surgeon:  [name], MD;  Location: [facility];  Service: General;  Laterality: N/A;  . Cholecystectomy    . Tubal ligation  1983  . Back surgery      Prior to Admission medications   Medication Sig Start Date End Date Taking? Authorizing Provider  ARIPiprazole (ABILIFY) 10 MG tablet Take 10 mg by mouth at bedtime.    Yes Historical Provider, MD  aspirin 81 MG tablet Take 1 tablet (81 mg total) by mouth daily. 07/23/13  Yes Clanford Cyndie Mull, MD  cetirizine (ZYRTEC) 10 MG tablet Take 10 mg by mouth daily.   Yes Historical Provider, MD  FLUoxetine (PROZAC) 40 MG capsule Take 40 mg by mouth every morning.    Yes Historical Provider, MD  furosemide (LASIX) 20 MG tablet Take 20 mg by mouth daily.   Yes  Historical Provider, MD  iron polysaccharides (POLY-IRON 150) 150 MG capsule Take 1 capsule (150 mg total) by mouth 2 (two) times daily. 09/05/13  Yes Hilarie Fredrickson, MD  Loperamide HCl (IMODIUM PO) Take 2 tablets by mouth 2 (two) times daily as needed (diarrhea).   Yes Historical Provider, MD  LORazepam (ATIVAN) 0.5 MG tablet Take 0.5 mg by mouth 2 (two) times daily.    Yes Historical Provider, MD  mesalamine (LIALDA) 1.2 G EC tablet Take 1.2 g by mouth 2 (two) times daily.   Yes Historical Provider, MD  Multiple Vitamins-Minerals (MULTIVITAMIN WITH MINERALS) tablet Take 1 tablet by mouth daily.   Yes Historical Provider, MD  omeprazole (PRILOSEC) 40 MG capsule Take 40 mg by mouth daily.   Yes Historical Provider, MD  pentosan polysulfate (ELMIRON) 100 MG capsule Take 200 mg by mouth 2 (two) times daily.    Yes Historical Provider, MD  traZODone (DESYREL) 50 MG tablet Take 50 mg by mouth at bedtime.   Yes Historical Provider, MD    Scheduled Meds: . ARIPiprazole  10 mg Oral QHS  . FLUoxetine  40 mg Oral q morning - 10a  . furosemide  20 mg Oral Daily  . iron polysaccharides  150 mg Oral BID  . lactulose  30 g Oral Daily  . LORazepam  0.5 mg Oral BID  . mesalamine  1.2 g Oral BID  . pantoprazole  40 mg Oral Daily  . pentosan polysulfate  200 mg Oral BID  . sodium chloride  3 mL Intravenous Q12H  . traZODone  50 mg Oral QHS   Infusions:   PRN Meds: acetaminophen, acetaminophen, morphine injection, ondansetron (ZOFRAN) IV, ondansetron   Allergies as of 10/13/2013 - Review Complete 10/13/2013  Allergen Reaction Noted  . Sulfonamide derivatives    . Wheat bran  07/07/2012    Family History  Problem Relation Age of Onset  . Diabetes Mother   . Hypertension Mother   . Diabetes Brother   . Hypertension Brother   . Hyperlipidemia Brother   . Hypertension    . Colon cancer Neg Hx     History   Social History  . Marital Status: Married    Spouse Name: N/A    Number of Children:  1  . Years of Education: N/A   Occupational History  . TRANSCRIPTIONIST --[employer]    Social History Main Topics  . Smoking status: Never Smoker   . Smokeless tobacco: Never Used  . Alcohol Use: No  . Drug Use: No  . Sexual Activity: No   Other Topics Concern  . Not on file   Social History Narrative  Daughter lives in [place], [place]    REVIEW OF SYSTEMS: Constitutional:  Weight before splenectomy was 290s. Dropped as low as 127 earlier this year, creeping back up to 140s currentlyl ENT:  + daily nose bleeds for 2 years. Pulm:  No SOB, no cough, no asthma CV:  No palpitations, no LE edema.  GU:  No hematuria, no frequency GI:  Per HPI Heme: no excessive bruising.  Transfusions:  None per her recall Neuro:  No headaches, no peripheral tingling or numbness.  Confusion and balance improved in last 24 hours. Derm:  + intermittent itching on trunk and arms.  No sores Endocrine:  No sweats or chills.  No polyuria or dysuria Immunization:  Up to date flu and pneumovax Travel:  None beyond local counties in last few months.    PHYSICAL EXAM: Vital signs in last 24 hours: Filed Vitals:   10/14/13 0613  BP: 130/56  Pulse: 65  Temp: 98.7 F (37.1 C)  Resp: 18   Wt Readings from Last 3 Encounters:  10/13/13 71.215 kg (157 lb)  10/11/13 70.761 kg (156 lb)  09/05/13 69.627 kg (153 lb 8 oz)   General: pleasant WF who looks well and comfortable.   Head:  No swelling or asymmetry  Eyes:  No pallor or icterus Ears:  Not HOH  Nose:  No congestion, no bleeding Mouth:  Clear, moist, excellent dental health Neck:  No JVD, no bruits, no TMG or mass Lungs:  Clear bil.  No cough.  Breathing not labored.  Heart: RRR.  No MRG Abdomen:  Soft, NT, ND.  No mass or organomegaly. .   Rectal: deferred   Musc/Skeltl: no joint contractures, swelling or erythema Extremities:  No pedal edema  Neurologic:  No asterixis or tremor, oriented x 3.  Slow but accurate responses to questions.   Skin:  Telangectasia on upper chest. Tattoos:  none Nodes:  No cervical adenopathy   Psych:  Cooperative, pleasant, affect blunted.  Not agitated.   Intake/Output from previous day: 04/16 0701 - 04/17 0700 In: -  Out: 300 [Urine:300] Intake/Output this shift: Total I/O In: 480 [P.O.:480] Out: -   LAB RESULTS:  Recent Labs  10/13/13 1216 10/13/13 1226 10/14/13 0500  WBC 8.3  --  9.2  HGB 12.2 14.3 11.5*  HCT 34.2* 42.0 31.9*  PLT 226  --  201  MCV    86  BMET Lab Results  Component Value Date   NA 141 10/14/2013   NA 143 10/13/2013   NA 137 10/13/2013   K 3.8 10/14/2013   K 3.3* 10/13/2013   K 3.5* 10/13/2013   CL 110 10/14/2013   CL 104 10/13/2013   CL 104 10/13/2013   CO2 22 10/14/2013   CO2 21 10/13/2013   CO2 25 10/07/2013   GLUCOSE 113* 10/14/2013   GLUCOSE 135* 10/13/2013   GLUCOSE 135* 10/13/2013   BUN 5* 10/14/2013   BUN <3* 10/13/2013   BUN 5* 10/13/2013   CREATININE 0.42* 10/14/2013   CREATININE 0.60 10/13/2013   CREATININE 0.51 10/13/2013   CALCIUM 8.0* 10/14/2013   CALCIUM 8.3* 10/13/2013   CALCIUM 8.7 10/07/2013   LFT  Recent Labs  10/13/13 1216 10/14/13 0500  PROT 8.1 7.4  ALBUMIN 2.5* 2.3*  AST 50* 48*  ALT 19 19  ALKPHOS 111 94  BILITOT 2.0* 1.9*   PT/INR Lab Results  Component Value Date   INR 2.15* 10/13/2013        Protime  23.3   Drugs of Abuse     Component Value Date/Time   LABOPIA NONE DETECTED 10/13/2013 1327   COCAINSCRNUR NONE DETECTED 10/13/2013 1327   LABBENZ NONE DETECTED 10/13/2013 1327   AMPHETMU NONE DETECTED 10/13/2013 1327   THCU NONE DETECTED 10/13/2013 1327   LABBARB NONE DETECTED 10/13/2013 1327     RADIOLOGY STUDIES: Ct Head Wo Contrast 10/13/2013   CLINICAL DATA:  Shuffling gait, increased falls, tremors and blurry vision. Increased confusion. Code stroke.  EXAM: CT HEAD WITHOUT CONTRAST  TECHNIQUE: Contiguous axial images were obtained from the base of the skull through the vertex without contrast.   COMPARISON:  10/07/2013  FINDINGS: No visible hemorrhage or hydrocephalus.  No extra-axial fluid.  Equivocal 1.5 cm diameter asymmetric hypodensity right cerebellum (image 8 series 2) could represent acute stroke in the appropriate clinical setting. It is possible this appearance has progressed compared with the prior normal study of 10/07/2013. Hounsfield artifact could produce this appearance however. No definite signs of vertebral or basilar hyperattenuation to suggest posterior circulation thrombosis.  No hemispheric infarct. No mass lesion or midline shift. Calvarium intact. Unremarkable orbits, sinuses, and mastoids.  IMPRESSION: Cannot exclude acute right cerebellar infarct. No intracranial hemorrhage or mass lesion.  Critical Value/emergent results were called by telephone at the time of interpretation on 10/13/2013 at 12:33 PM to Dr. Amada Jupiter who verbally acknowledged these results.   Electronically Signed   By: Davonna Belling M.D.   On: 10/13/2013 12:35   Mr Brain Wo Contrast Mr Cervical Spine Wo Contrast 10/13/2013   COMPARISON:  Brain MRI and MRA 07/21/2013. [facility] brain MRI 03/03/2009.  FINDINGS: MRI HEAD FINDINGS  Stable cerebral volume. Major intracranial vascular flow voids are stable. No restricted diffusion to suggest acute infarction. No midline shift, mass effect, evidence of mass lesion, ventriculomegaly, extra-axial collection or acute intracranial hemorrhage. Cervicomedullary junction and pituitary are within normal limits. Negative visualized cervical spine.  As noted in January, there is increased intrinsic T1 signal of the globus palate eye. Elsewhere largely unremarkable for age gray and white matter signal throughout the brain (minimal nonspecific cerebral white matter signal changes). Probable developmental venous anomaly in the left cerebellum incidentally re- identified.  Visualized orbit soft tissues are within normal limits. Visible internal auditory structures appear  normal. Visualized paranasal sinuses and mastoids are clear. Trace retained secretions in the nasopharynx. Visualized scalp soft tissues are within normal limits.  MRI CERVICAL SPINE FINDINGS  Study is intermittently degraded by motion artifact despite repeated imaging attempts.  Preserved cervical vertebral height and alignment. No marrow edema or evidence of acute osseous abnormality.  Cervicomedullary junction is within normal limits. Spinal cord signal is within normal limits at all visualized levels. Normal patency of the cervical thecal sac. There is Mild disc bulging at C5-C6. No cervical neural foraminal stenosis identified.  Negative paraspinal soft tissues. Major vascular flow voids in the neck are preserved, dominant right vertebral artery.  IMPRESSION: 1.  No acute intracranial abnormality. 2. Continued abnormal T1 signal in the globus pallidi, as can be seen with hepatic insufficiency, cirrhosis, portosystemic shunt, portal vein thrombosis, total parenteral nutrition, and is less often with hyperglycemia or other metabolic disturbances such as Wilson's disease. Correlation with serum ammonia levels and liver function tests is recommended. 3. Negative cervical spine. Minimal/age congruent disc degeneration.   Electronically Signed   By: Augusto Gamble M.D.   On: 10/13/2013 16:57   Dg Chest Portable 1 View 10/13/2013   CLINICAL DATA:  Stroke symptoms  EXAM: PORTABLE CHEST - 1 VIEW  COMPARISON:  DG CHEST 1V PORT dated 10/07/2013  FINDINGS: The lungs are slightly less well inflated today than on the earlier study. There is no focal infiltrate. No area of atelectasis is demonstrated. The cardiopericardial silhouette remains enlarged. The pulmonary vascularity is not engorged. There is minimal stable prominence of the interstitial markings. There is no pleural effusion.  IMPRESSION: There is mild bilateral pulmonary hypoinflation. There is no definite evidence of pneumonia nor atelectasis nor CHF.    Electronically Signed   By: David  [place]   On: 10/13/2013 13:46    ENDOSCOPIC STUDIES: 07/01/2013  EGD Healed Gastric ulcer.  Biopsy of inflammatory appearing gastric nodule.   12/2012  EGD for ulcer follow up.  ENDOSCOPIC IMPRESSION:  1. Improved appearrance of ulcer, but still with residual ulceration  RECOMMENDATIONS:  1. Await biopsy results  2. Continue current medications  3. Avoid NSAIDS (anti-inflammatory medicines) as you have been  4. Repeat Upper Endoscopy in 8 weeks to reassess ulcer   10/2012  EGD for Melena ENDOSCOPIC IMPRESSION:  1. Gastric ulcer. Source for recent GI bleed. Status post CLO biopsy  2. Celiac disease  RECOMMENDATIONS:  1. Avoid NSAIDS (medicines like Advil, ibuprofen, Aleve, aspirin,  etc.)  2. Continue pantoprazole 40 mg twice daily until further notice  3. Continue oral iron therapy daily  4. Strict gluten-free diet  3. OP follow-up with Dr. Marina Goodell in 4 weeks.  06/2012  Colonoscopy  INDICATIONS:High risk patient with previously diagnosed Crohn's  disease: large intestine. Mild left sided 03-2007 ENDOSCOPIC IMPRESSION:  1. Normal mucosa in the terminal ileum  2. Normal colonic mucosa. No active Crohn's  RECOMMENDATIONS:  1. 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 Ammonia 70. Has improved with addition of Lactulose.  Neurologist, Dr Amada Jupiter favors dx of hepatic encephalopathy.   *  Hx gastric ulcer with melena 10/2012, healed on EGD of 07/01/2013.  Stays on chronic PPI, Prilosec 40 mg daily.   *  Hx left sided Crohn's colitis in 2008.  No colitis seen on 06/2012 colonoscopy.  On Lialda only.  *  Coagulopathy, likely sequela of cirrhosis.      PLAN:     *  Given neurologic improvement and the stool incontinence, will drop lactulose back to 15 ml daily. At this point not adding Rifaxamin but it remains an option if needed. .  *  Made ROV for pt 12/05/13 at 10 AM with Dr  Marina Goodell.  Should be seen by Dr Beverely Low within 3 to 4 weeks.   Dianah Field  10/14/2013, 12:33 PM Pager: [phone]      Attending physician's note   I have taken a history, examined the patient and reviewed the chart. I agree with the Advanced Practitioner's note, impression and recommendations. Cirrhosis with presumed hepatic encephalopathy which has improved/resolved with lactulose. Titrate lactulose dose for about 3 bowel movements per day. If incontinence remains a problem would change to rifaximin 550 mg bid. GI signing off. Outpatient follow up appts with Dr. Marina Goodell and Dr. Beverely Low as above.   Meryl Dare, MD Clementeen Graham

## 2013-10-15 LAB — COMPREHENSIVE METABOLIC PANEL
ALT: 16 U/L (ref 0–35)
AST: 43 U/L — ABNORMAL HIGH (ref 0–37)
Albumin: 2.1 g/dL — ABNORMAL LOW (ref 3.5–5.2)
Alkaline Phosphatase: 89 U/L (ref 39–117)
BILIRUBIN TOTAL: 1.7 mg/dL — AB (ref 0.3–1.2)
BUN: 4 mg/dL — AB (ref 6–23)
CALCIUM: 8 mg/dL — AB (ref 8.4–10.5)
CHLORIDE: 109 meq/L (ref 96–112)
CO2: 20 meq/L (ref 19–32)
CREATININE: 0.48 mg/dL — AB (ref 0.50–1.10)
GFR calc non Af Amer: >90 mL/min (ref >90)
GLUCOSE: 73 mg/dL (ref 70–99)
Potassium: 3.3 mEq/L — ABNORMAL LOW (ref 3.7–5.3)
Sodium: 140 mEq/L (ref 137–147)
Total Protein: 6.8 g/dL (ref 6.0–8.3)

## 2013-10-15 LAB — GLUCOSE, CAPILLARY
GLUCOSE-CAPILLARY: 87 mg/dL (ref 70–99)
GLUCOSE-CAPILLARY: 101 mg/dL — AB (ref 70–99)
Glucose-Capillary: 66 mg/dL — ABNORMAL LOW (ref 70–99)
Glucose-Capillary: 123 mg/dL — ABNORMAL HIGH (ref 70–99)

## 2013-10-15 LAB — AMMONIA: AMMONIA: 52 umol/L (ref 11–60)

## 2013-10-15 MED ORDER — ARTIFICIAL TEARS OP OINT
TOPICAL_OINTMENT | Freq: Three times a day (TID) | OPHTHALMIC | Status: DC | PRN
  Filled 2013-10-15: qty 3.5

## 2013-10-15 MED ORDER — RIFAXIMIN 550 MG PO TABS
550.0000 mg | ORAL_TABLET | Freq: Two times a day (BID) | ORAL | Status: DC
  Administered 2013-10-15 – 2013-10-16 (×2): 550 mg via ORAL
  Filled 2013-10-15 (×4): qty 1

## 2013-10-15 MED ORDER — POTASSIUM CHLORIDE CRYS ER 20 MEQ PO TBCR: 40.0000 meq | EXTENDED_RELEASE_TABLET | Freq: Once | ORAL | Status: DC

## 2013-10-15 MED ORDER — ENSURE COMPLETE PO LIQD
237.0000 mL | Freq: Two times a day (BID) | ORAL | Status: DC
  Administered 2013-10-16: 237 mL via ORAL

## 2013-10-15 MED ORDER — ARTIFICIAL TEARS OP OINT
TOPICAL_OINTMENT | Freq: Three times a day (TID) | OPHTHALMIC | Status: DC
  Filled 2013-10-15: qty 3.5

## 2013-10-15 NOTE — Progress Notes (Signed)
[facility] PROGRESS NOTE  Sabrina QuailCynthia [name] OZH:[id number] DOB: 10/14/1955 DOA: 10/13/2013 PCP: Neena RhymesKatherine Tabori, MD  Assessment/Plan: 1-Hepatic Encephalopathy; patient presents with gait disturbance, confusion, slurred speech. Symptoms improved with Lactulose. MRI negative for acute stroke.  B-12 1966. RPR non reactive. TSH 1.380.  Ammonia at 70 on admission. Repeat today.  Per family patient 90 % at baseline.  Had 3 BM with lactulose. Will try rifaximin.   2-Liver failure; continue with lactulose, lasix. GI inform of admission. Follow up with Dr Marina GoodellPerry. bilirubin trending down.  Nutrition consult.   3-Diabetes; last HB A1 at 5.5.  Snack at bedtime.   4-HLD; Avoid statin with liver diseases.   5-Anxiety/depression/bipolar: Cont home meds for now  6-Aortic stenosis; Appears stable.   7-DVT prophylaxis: discontinue heparin, high risk for bleeding. Scd.  8- Crohn's colitis. On mesalamine 9-Hypokalemia; replete 40 meq times one.   Code Status: Full Code.  Family Communication:  Disposition Plan: remain inpatient. PT evaluation.    Consultants:  Neurology  Procedures:  none  Antibiotics:  none  HPI/Subjective: Speech better. Patient 90 % improved.  She has some irritation left eye. No vision changes, no eye ball pain.   Objective: Filed Vitals:   10/15/13 1421  BP: 118/48  Pulse: 63  Temp: 98.3 F (36.8 C)  Resp: 19    Intake/Output Summary (Last 24 hours) at 10/15/13 1423 Last data filed at 10/15/13 1345  Gross per 24 hour  Intake    420 ml  Output    600 ml  Net   -180 ml   Filed Weights   10/13/13 1237 10/14/13 2346  Weight: 71.215 kg (157 lb) 68.266 kg (150 lb 8 oz)    Exam:   General:  Alert, following command.   Cardiovascular: S 1, S 2 RRR  Respiratory: CTA  Abdomen: BS present, soft, NT  Musculoskeletal: no edema.   Data Reviewed: Basic Metabolic Panel:  Recent Labs Lab 10/13/13 1216 10/13/13 1226 10/14/13 0500  10/15/13 0301  NA 137 143 141 140  K 3.5* 3.3* 3.8 3.3*  CL 104 104 110 109  CO2 21  --  22 20  GLUCOSE 135* 135* 113* 73  BUN 5* <3* 5* 4*  CREATININE 0.51 0.60 0.42* 0.48*  CALCIUM 8.3*  --  8.0* 8.0*   Liver Function Tests:  Recent Labs Lab 10/13/13 1216 10/14/13 0500 10/15/13 0301  AST 50* 48* 43*  ALT 19 19 16   ALKPHOS 111 94 89  BILITOT 2.0* 1.9* 1.7*  PROT 8.1 7.4 6.8  ALBUMIN 2.5* 2.3* 2.1*   No results found for this basename: LIPASE, AMYLASE,  in the last 168 hours  Recent Labs Lab 10/13/13 1322  AMMONIA 70*   CBC:  Recent Labs Lab 10/13/13 1216 10/13/13 1226 10/14/13 0500  WBC 8.3  --  9.2  NEUTROABS 3.9  --   --   HGB 12.2 14.3 11.5*  HCT 34.2* 42.0 31.9*  MCV 87.2  --  86.9  PLT 226  --  201   Cardiac Enzymes: No results found for this basename: CKTOTAL, CKMB, CKMBINDEX, TROPONINI,  in the last 168 hours BNP (last 3 results) No results found for this basename: PROBNP,  in the last 8760 hours CBG:  Recent Labs Lab 10/14/13 1116 10/14/13 1730 10/14/13 2046 10/15/13 0728 10/15/13 1140  GLUCAP 105* 84 123* 66* 123*    No results found for this or any previous visit (from the past 240 hour(s)).   Studies: Mr Brain Wo Contrast  10/13/2013   CLINICAL DATA:  58 year old female with new onset right side weakness and falling to the right. Gait disturbance and tremor. Altered mental status. Initial encounter.  EXAM: MRI HEAD WITHOUT CONTRAST  MRI CERVICAL SPINE WITHOUT CONTRAST  TECHNIQUE: Multiplanar, multiecho pulse sequences of the brain and surrounding structures, and cervical spine, to include the craniocervical junction and cervicothoracic junction, were obtained without intravenous contrast.  COMPARISON:  Brain MRI and MRA 07/21/2013. [facility] brain MRI 03/03/2009.  FINDINGS: MRI HEAD FINDINGS  Stable cerebral volume. Major intracranial vascular flow voids are stable. No restricted diffusion to suggest acute infarction. No midline  shift, mass effect, evidence of mass lesion, ventriculomegaly, extra-axial collection or acute intracranial hemorrhage. Cervicomedullary junction and pituitary are within normal limits. Negative visualized cervical spine.  As noted in January, there is increased intrinsic T1 signal of the globus palate eye. Elsewhere largely unremarkable for age gray and white matter signal throughout the brain (minimal nonspecific cerebral white matter signal changes). Probable developmental venous anomaly in the left cerebellum incidentally re- identified.  Visualized orbit soft tissues are within normal limits. Visible internal auditory structures appear normal. Visualized paranasal sinuses and mastoids are clear. Trace retained secretions in the nasopharynx. Visualized scalp soft tissues are within normal limits.  MRI CERVICAL SPINE FINDINGS  Study is intermittently degraded by motion artifact despite repeated imaging attempts.  Preserved cervical vertebral height and alignment. No marrow edema or evidence of acute osseous abnormality.  Cervicomedullary junction is within normal limits. Spinal cord signal is within normal limits at all visualized levels. Normal patency of the cervical thecal sac. There is Mild disc bulging at C5-C6. No cervical neural foraminal stenosis identified.  Negative paraspinal soft tissues. Major vascular flow voids in the neck are preserved, dominant right vertebral artery.  IMPRESSION: 1.  No acute intracranial abnormality. 2. Continued abnormal T1 signal in the globus pallidi, as can be seen with hepatic insufficiency, cirrhosis, portosystemic shunt, portal vein thrombosis, total parenteral nutrition, and is less often with hyperglycemia or other metabolic disturbances such as Wilson's disease. Correlation with serum ammonia levels and liver function tests is recommended. 3. Negative cervical spine. Minimal/age congruent disc degeneration.   Electronically Signed   By: Augusto Gamble M.D.   On: 10/13/2013  16:57   Mr Cervical Spine Wo Contrast  10/13/2013   CLINICAL DATA:  58 year old female with new onset right side weakness and falling to the right. Gait disturbance and tremor. Altered mental status. Initial encounter.  EXAM: MRI HEAD WITHOUT CONTRAST  MRI CERVICAL SPINE WITHOUT CONTRAST  TECHNIQUE: Multiplanar, multiecho pulse sequences of the brain and surrounding structures, and cervical spine, to include the craniocervical junction and cervicothoracic junction, were obtained without intravenous contrast.  COMPARISON:  Brain MRI and MRA 07/21/2013. [facility] brain MRI 03/03/2009.  FINDINGS: MRI HEAD FINDINGS  Stable cerebral volume. Major intracranial vascular flow voids are stable. No restricted diffusion to suggest acute infarction. No midline shift, mass effect, evidence of mass lesion, ventriculomegaly, extra-axial collection or acute intracranial hemorrhage. Cervicomedullary junction and pituitary are within normal limits. Negative visualized cervical spine.  As noted in January, there is increased intrinsic T1 signal of the globus palate eye. Elsewhere largely unremarkable for age gray and white matter signal throughout the brain (minimal nonspecific cerebral white matter signal changes). Probable developmental venous anomaly in the left cerebellum incidentally re- identified.  Visualized orbit soft tissues are within normal limits. Visible internal auditory structures appear normal. Visualized paranasal sinuses and mastoids are clear. Trace retained secretions  in the nasopharynx. Visualized scalp soft tissues are within normal limits.  MRI CERVICAL SPINE FINDINGS  Study is intermittently degraded by motion artifact despite repeated imaging attempts.  Preserved cervical vertebral height and alignment. No marrow edema or evidence of acute osseous abnormality.  Cervicomedullary junction is within normal limits. Spinal cord signal is within normal limits at all visualized levels. Normal patency of  the cervical thecal sac. There is Mild disc bulging at C5-C6. No cervical neural foraminal stenosis identified.  Negative paraspinal soft tissues. Major vascular flow voids in the neck are preserved, dominant right vertebral artery.  IMPRESSION: 1.  No acute intracranial abnormality. 2. Continued abnormal T1 signal in the globus pallidi, as can be seen with hepatic insufficiency, cirrhosis, portosystemic shunt, portal vein thrombosis, total parenteral nutrition, and is less often with hyperglycemia or other metabolic disturbances such as Wilson's disease. Correlation with serum ammonia levels and liver function tests is recommended. 3. Negative cervical spine. Minimal/age congruent disc degeneration.   Electronically Signed   By: Augusto Gamble M.D.   On: 10/13/2013 16:57    Scheduled Meds: . ARIPiprazole  10 mg Oral QHS  . FLUoxetine  40 mg Oral q morning - 10a  . furosemide  20 mg Oral Daily  . iron polysaccharides  150 mg Oral BID  . LORazepam  0.5 mg Oral BID  . mesalamine  1.2 g Oral BID  . pantoprazole  40 mg Oral Daily  . pentosan polysulfate  200 mg Oral BID  . potassium chloride  40 mEq Oral Once  . rifaximin  550 mg Oral BID  . sodium chloride  3 mL Intravenous Q12H  . traZODone  50 mg Oral QHS   Continuous Infusions:   Principal Problem:   Encephalopathy, hepatic Active Problems:   DIABETES MELLITUS   HYPERLIPIDEMIA   ANXIETY DEPRESSION   HYPERTENSION   CROHN'S DISEASE-LARGE INTESTINE   BIPOLAR AFFECTIVE DISORDER   Aortic stenosis   Stress at work    Time spent: 35 minutes.     Roosevelt Eimers A Jowell Bossi  [facility] Pager [phone]. If 7PM-7AM, please contact night-coverage at [email or web address], password [facility] 10/15/2013, 2:23 PM  LOS: 2 days

## 2013-10-15 NOTE — Evaluation (Signed)
Physical Therapy Evaluation Patient Details Name: Sabrina [name] MRN: [id number] DOB: 1956/03/19 Today's Date: 10/15/2013   History of Present Illness  Patient presents with gait disturbance, confusion, slurred speech. Symptoms improved with Lactulose. MRI negative for acute stroke. Symptons thought to be due to hepatic encephalopathy. Pt with history of Crohn's, DM, bipolar, liver cirrhosis.  Clinical Impression  Pt admitted with above. Pt currently with functional limitations due to the deficits listed below (see PT Problem List).  Pt will benefit from skilled PT to increase their independence and safety with mobility to allow discharge home with husband. Expect pt will progress quickly.     Follow Up Recommendations Other (comment) (TBD)    Equipment Recommendations  Other (comment) (TBD)    Recommendations for Other Services       Precautions / Restrictions Precautions Precautions: Fall      Mobility  Bed Mobility Overal bed mobility: Independent                Transfers Overall transfer level: Needs assistance Equipment used: Rolling walker (2 wheeled) Transfers: Sit to/from Stand Sit to Stand: Supervision         General transfer comment: verbal cues for hand placement  Ambulation/Gait Ambulation/Gait assistance: Min guard Ambulation Distance (Feet): 150 Feet Assistive device: Rolling walker (2 wheeled);None Gait Pattern/deviations: Step-through pattern;Decreased stride length   Gait velocity interpretation: Below normal speed for age/gender General Gait Details: slightly unsteady and tending to run into objects on lt side (unsure if walker was pulling).  Stairs            Wheelchair Mobility    Modified Rankin (Stroke Patients Only)       Balance Overall balance assessment: Needs assistance Sitting-balance support: No upper extremity supported Sitting balance-Leahy Scale: Normal     Standing balance support: No upper extremity  supported Standing balance-Leahy Scale: Good                               Pertinent Vitals/Pain     Home Living Family/patient expects to be discharged to:: Private residence Living Arrangements: Spouse/significant other Available Help at Discharge: Family;Available PRN/intermittently Type of Home: Apartment Home Access: Stairs to enter Entrance Stairs-Rails: Right Entrance Stairs-Number of Steps: 1 flight Home Layout: One level Home Equipment: None      Prior Function Level of Independence: Independent         Comments: Pt works as a Museum/gallery exhibitions officer.  Husband also works.       Hand Dominance        Extremity/Trunk Assessment   Upper Extremity Assessment: Overall WFL for tasks assessed           Lower Extremity Assessment: Generalized weakness         Communication   Communication: No difficulties  Cognition Arousal/Alertness: Awake/alert Behavior During Therapy: WFL for tasks assessed/performed Overall Cognitive Status: Within Functional Limits for tasks assessed                      General Comments      Exercises        Assessment/Plan    PT Assessment Patient needs continued PT services  PT Diagnosis Difficulty walking;Generalized weakness   PT Problem List Decreased strength;Decreased balance;Decreased mobility;Decreased knowledge of use of DME  PT Treatment Interventions DME instruction;Gait training;Patient/family education;Stair training;Functional mobility training;Therapeutic exercise;Therapeutic activities;Balance training   PT Goals (Current goals can be found in the Care  Plan section) Acute Rehab PT Goals Patient Stated Goal: Return home PT Goal Formulation: With patient Time For Goal Achievement: 10/22/13 Potential to Achieve Goals: Good    Frequency Min 3X/week   Barriers to discharge Decreased caregiver support      Co-evaluation               End of Session Equipment Utilized  During Treatment: Gait belt Activity Tolerance: Patient tolerated treatment well Patient left: in chair;with call bell/phone within reach Nurse Communication: Mobility status         Time: 1208-1219 PT Time Calculation (min): 11 min   Charges:   PT Evaluation $Initial PT Evaluation Tier I: 1 Procedure PT Treatments $Gait Training: 8-22 mins   PT G CodesAngelina Ok:          Maeley Matton W Colbie Danner 10/15/2013, 12:40 PM  [employer]ary Jams Trickett PT [phone]

## 2013-10-16 LAB — GLUCOSE, CAPILLARY
Glucose-Capillary: 73 mg/dL (ref 70–99)
Glucose-Capillary: 155 mg/dL — ABNORMAL HIGH (ref 70–99)

## 2013-10-16 LAB — BASIC METABOLIC PANEL
BUN: 5 mg/dL — AB (ref 6–23)
CALCIUM: 8.2 mg/dL — AB (ref 8.4–10.5)
CO2: 22 mEq/L (ref 19–32)
CREATININE: 0.44 mg/dL — AB (ref 0.50–1.10)
Chloride: 111 mEq/L (ref 96–112)
GFR calc Af Amer: >90 mL/min (ref >90)
GFR calc non Af Amer: >90 mL/min (ref >90)
GLUCOSE: 75 mg/dL (ref 70–99)
Potassium: 3.3 mEq/L — ABNORMAL LOW (ref 3.7–5.3)
Sodium: 140 mEq/L (ref 137–147)

## 2013-10-16 LAB — VITAMIN E
ALPHA-TOCOPHEROL: 12.5 mg/L (ref 5.7–19.9)
GAMMA-TOCOPHEROL (VIT E): 0.9 mg/L (ref <=4.3)

## 2013-10-16 MED ORDER — RIFAXIMIN 550 MG PO TABS: 550.0000 mg | ORAL_TABLET | Freq: Two times a day (BID) | ORAL | Status: DC

## 2013-10-16 MED ORDER — ENSURE COMPLETE PO LIQD: 237.0000 mL | Freq: Two times a day (BID) | ORAL | Status: DC

## 2013-10-16 MED ORDER — LACTULOSE 10 G PO PACK: 10.0000 g | PACK | Freq: Two times a day (BID) | ORAL | Status: DC

## 2013-10-16 MED ORDER — POTASSIUM CHLORIDE CRYS ER 20 MEQ PO TBCR
40.0000 meq | EXTENDED_RELEASE_TABLET | Freq: Once | ORAL | Status: AC
  Administered 2013-10-16: 40 meq via ORAL
  Filled 2013-10-16: qty 2

## 2013-10-16 NOTE — Discharge Summary (Addendum)
Physician Discharge Summary  Sabrina [name] HYQ:[id number] DOB: 1956/03/31 DOA: 10/13/2013  PCP: Neena Rhymes, MD  Admit date: 10/13/2013 Discharge date: 10/16/2013  Time spent: 35 minutes  Recommendations for Outpatient Follow-up:  1. Need follow up with Dr Marina Goodell for further evaluation treatment liver failure.    Discharge Diagnoses:    Encephalopathy, hepatic   DIABETES MELLITUS   HYPERLIPIDEMIA   ANXIETY DEPRESSION   HYPERTENSION   CROHN'S DISEASE-LARGE INTESTINE   BIPOLAR AFFECTIVE DISORDER   Aortic stenosis   Stress at work   Discharge Condition: stable.  Diet recommendation: regular diet  Filed Weights   10/13/13 1237 10/14/13 2346 10/16/13 0528  Weight: 71.215 kg (157 lb) 68.266 kg (150 lb 8 oz) 68.1 kg (150 lb 2.1 oz)    History of present illness:  Sabrina [name] is a 58 y.o. female  With a hx of DM, htn, hld, celiac disease who presents to the ED with new onset R sided weakness and listing to the R on ambulation. The patient has been seen recently by Neurology as an outpatient for intermittent gait disturbances and tremors. On the morning of hospital admission, the patient noted new onset R sided weakness with leaning to the R on ambulation. She subsequently presented to the ED where a 1.5cm asymmetric hypodensity was noted in the R cerebellum. Neurology was consulted and the hospitalists consulted for admission.  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 Admits to feeling very depressed recently   Hospital Course:  Patient admitted with slurred speech, gait disturbance confusion. MRI negative for stroke. Ammonia level at 70. Her symptoms resolved with lactulose.   1-Hepatic Encephalopathy; patient presents with gait disturbance, confusion, slurred speech. Symptoms improved with Lactulose. MRI negative for acute stroke.   B-12 1966. RPR non reactive. TSH 1.380.  Ammonia at 70 on admission. Repeat today.  Per family patient 90 % at baseline.  Had 3 BM with lactulose. Will try rifaximin for encephalopathy prevention with her prior history of GI diseases (chron and celiac diseases).  I have provide prescriptions for both rifaximin and lactulose in case patient is not able to afford rifaximin.   2-Liver failure; continue with lactulose, lasix. GI inform of admission. Follow up with Dr Marina Goodell. bilirubin trending down.   3-Diabetes; last HB A1 at 5.5. Snack at bedtime. Patient with low blood sugar. Not on insulin. I have started ensure.  4-HLD; Avoid statin with liver diseases.  5-Anxiety/depression/bipolar: Cont home meds for now  6-Aortic stenosis; Appears stable.  7-DVT prophylaxis: discontinue heparin, high risk for bleeding. Scd.  8- Crohn's colitis. On mesalamine  9-Hypokalemia; replete 40 meq times one. 10-cojuntiva erythema; probably capylari rupture. No vision changes, no eye pain.   Procedures:    Consultations:  GI  Neurology   Discharge Exam: Filed Vitals:   10/16/13 0528  BP: 124/60  Pulse: 69  Temp: 98 F (36.7 C)  Resp: 18    General: No distress.  Cardiovascular: S 1, S 2 RRR Respiratory: CTA  Discharge Instructions You were cared for by a hospitalist during your hospital stay. If you have any questions about your discharge medications or the care you received while you were in the hospital after you are discharged, you can call the unit and asked to speak with the hospitalist on call if the hospitalist that took care of you is not available. Once  you are discharged, your primary care physician will handle any further medical issues. Please note that NO REFILLS for any discharge medications will be authorized once you are discharged, as it is imperative that you return to your primary care physician (or establish a relationship with a primary care physician if you do not have one)  for your aftercare needs so that they can reassess your need for medications and monitor your lab values.  Discharge Orders   Future Appointments Provider Department Dept Phone   12/05/2013 10:00 AM Hilarie FredricksonJohn N Perry, MD [facility] [phone]   12/20/2013 2:30 PM Ronal FearLynn E Lam, NP [facility] [phone]   Future Orders Complete By Expires   Diet - low sodium heart healthy  As directed    Increase activity slowly  As directed        Medication List         ARIPiprazole 10 MG tablet  Commonly known as:  ABILIFY  Take 10 mg by mouth at bedtime.     aspirin 81 MG tablet  Take 1 tablet (81 mg total) by mouth daily.     cetirizine 10 MG tablet  Commonly known as:  ZYRTEC  Take 10 mg by mouth daily.     feeding supplement (ENSURE COMPLETE) Liqd  Take 237 mLs by mouth 2 (two) times daily between meals.     FLUoxetine 40 MG capsule  Commonly known as:  PROZAC  Take 40 mg by mouth every morning.     furosemide 20 MG tablet  Commonly known as:  LASIX  Take 20 mg by mouth daily.     IMODIUM PO  Take 2 tablets by mouth 2 (two) times daily as needed (diarrhea).     iron polysaccharides 150 MG capsule  Commonly known as:  POLY-IRON 150  Take 1 capsule (150 mg total) by mouth 2 (two) times daily.     LORazepam 0.5 MG tablet  Commonly known as:  ATIVAN  Take 0.5 mg by mouth 2 (two) times daily.     mesalamine 1.2 G EC tablet  Commonly known as:  LIALDA  Take 1.2 g by mouth 2 (two) times daily.     multivitamin with minerals tablet  Take 1 tablet by mouth daily.     omeprazole 40 MG capsule  Commonly known as:  PRILOSEC  Take 40 mg by mouth daily.     pentosan polysulfate 100 MG capsule  Commonly known as:  ELMIRON  Take 200 mg by mouth 2 (two) times daily.     rifaximin 550 MG Tabs tablet  Commonly known as:  XIFAXAN  Take 1 tablet (550 mg total) by mouth 2 (two) times daily.     traZODone 50 MG tablet  Commonly known as:   DESYREL  Take 50 mg by mouth at bedtime.       Allergies  Allergen Reactions  . Sulfonamide Derivatives     Classic angioedema reaction  . Wheat Bran     Celiac's disease       Follow-up Information   Follow up with Yancey FlemingsJohn Perry, MD In 2 weeks.   Specialty:  Gastroenterology   Contact information:   [address] [place] [address] [phone]       Follow up with Neena RhymesKatherine Tabori, MD In 1 week.   Specialty:  Family Medicine   Contact information:   [id number] [place] [address] [phone]        The results of  significant diagnostics from this hospitalization (including imaging, microbiology, ancillary and laboratory) are listed below for reference.    Significant Diagnostic Studies: Ct Head Wo Contrast  10/13/2013   CLINICAL DATA:  Shuffling gait, increased falls, tremors and blurry vision. Increased confusion. Code stroke.  EXAM: CT HEAD WITHOUT CONTRAST  TECHNIQUE: Contiguous axial images were obtained from the base of the skull through the vertex without contrast.  COMPARISON:  10/07/2013  FINDINGS: No visible hemorrhage or hydrocephalus.  No extra-axial fluid.  Equivocal 1.5 cm diameter asymmetric hypodensity right cerebellum (image 8 series 2) could represent acute stroke in the appropriate clinical setting. It is possible this appearance has progressed compared with the prior normal study of 10/07/2013. Hounsfield artifact could produce this appearance however. No definite signs of vertebral or basilar hyperattenuation to suggest posterior circulation thrombosis.  No hemispheric infarct. No mass lesion or midline shift. Calvarium intact. Unremarkable orbits, sinuses, and mastoids.  IMPRESSION: Cannot exclude acute right cerebellar infarct. No intracranial hemorrhage or mass lesion.  Critical Value/emergent results were called by telephone at the time of interpretation on 10/13/2013 at 12:33 PM to Dr. Amada Jupiter who verbally acknowledged these results.    Electronically Signed   By: Davonna Belling M.D.   On: 10/13/2013 12:35   Ct Head Wo Contrast  10/07/2013   CLINICAL DATA:  Altered mental status, weakness.  EXAM: CT HEAD WITHOUT CONTRAST  TECHNIQUE: Contiguous axial images were obtained from the base of the skull through the vertex without intravenous contrast.  COMPARISON:  MR HEAD W/O CM dated 07/21/2013; CT HEAD W/O CM dated 07/21/2013  FINDINGS: The ventricles and sulci are normal. No intraparenchymal hemorrhage, mass effect nor midline shift. No acute large vascular territory infarcts.  No abnormal extra-axial fluid collections. Basal cisterns are patent.  No skull fracture. Visualized paranasal sinuses and mastoid air-cells are well-aerated. The included ocular globes and orbital contents are non-suspicious.  IMPRESSION: No acute intracranial process.   Electronically Signed   By: Awilda Metro   On: 10/07/2013 16:45   Mr Brain Wo Contrast  10/13/2013   CLINICAL DATA:  58 year old female with new onset right side weakness and falling to the right. Gait disturbance and tremor. Altered mental status. Initial encounter.  EXAM: MRI HEAD WITHOUT CONTRAST  MRI CERVICAL SPINE WITHOUT CONTRAST  TECHNIQUE: Multiplanar, multiecho pulse sequences of the brain and surrounding structures, and cervical spine, to include the craniocervical junction and cervicothoracic junction, were obtained without intravenous contrast.  COMPARISON:  Brain MRI and MRA 07/21/2013. [facility] brain MRI 03/03/2009.  FINDINGS: MRI HEAD FINDINGS  Stable cerebral volume. Major intracranial vascular flow voids are stable. No restricted diffusion to suggest acute infarction. No midline shift, mass effect, evidence of mass lesion, ventriculomegaly, extra-axial collection or acute intracranial hemorrhage. Cervicomedullary junction and pituitary are within normal limits. Negative visualized cervical spine.  As noted in January, there is increased intrinsic T1 signal of the globus  palate eye. Elsewhere largely unremarkable for age gray and white matter signal throughout the brain (minimal nonspecific cerebral white matter signal changes). Probable developmental venous anomaly in the left cerebellum incidentally re- identified.  Visualized orbit soft tissues are within normal limits. Visible internal auditory structures appear normal. Visualized paranasal sinuses and mastoids are clear. Trace retained secretions in the nasopharynx. Visualized scalp soft tissues are within normal limits.  MRI CERVICAL SPINE FINDINGS  Study is intermittently degraded by motion artifact despite repeated imaging attempts.  Preserved cervical vertebral height and alignment. No marrow edema or evidence of acute  osseous abnormality.  Cervicomedullary junction is within normal limits. Spinal cord signal is within normal limits at all visualized levels. Normal patency of the cervical thecal sac. There is Mild disc bulging at C5-C6. No cervical neural foraminal stenosis identified.  Negative paraspinal soft tissues. Major vascular flow voids in the neck are preserved, dominant right vertebral artery.  IMPRESSION: 1.  No acute intracranial abnormality. 2. Continued abnormal T1 signal in the globus pallidi, as can be seen with hepatic insufficiency, cirrhosis, portosystemic shunt, portal vein thrombosis, total parenteral nutrition, and is less often with hyperglycemia or other metabolic disturbances such as Wilson's disease. Correlation with serum ammonia levels and liver function tests is recommended. 3. Negative cervical spine. Minimal/age congruent disc degeneration.   Electronically Signed   By: Augusto Gamble M.D.   On: 10/13/2013 16:57   Mr Cervical Spine Wo Contrast  10/13/2013   CLINICAL DATA:  58 year old female with new onset right side weakness and falling to the right. Gait disturbance and tremor. Altered mental status. Initial encounter.  EXAM: MRI HEAD WITHOUT CONTRAST  MRI CERVICAL SPINE WITHOUT CONTRAST   TECHNIQUE: Multiplanar, multiecho pulse sequences of the brain and surrounding structures, and cervical spine, to include the craniocervical junction and cervicothoracic junction, were obtained without intravenous contrast.  COMPARISON:  Brain MRI and MRA 07/21/2013. [facility] brain MRI 03/03/2009.  FINDINGS: MRI HEAD FINDINGS  Stable cerebral volume. Major intracranial vascular flow voids are stable. No restricted diffusion to suggest acute infarction. No midline shift, mass effect, evidence of mass lesion, ventriculomegaly, extra-axial collection or acute intracranial hemorrhage. Cervicomedullary junction and pituitary are within normal limits. Negative visualized cervical spine.  As noted in January, there is increased intrinsic T1 signal of the globus palate eye. Elsewhere largely unremarkable for age gray and white matter signal throughout the brain (minimal nonspecific cerebral white matter signal changes). Probable developmental venous anomaly in the left cerebellum incidentally re- identified.  Visualized orbit soft tissues are within normal limits. Visible internal auditory structures appear normal. Visualized paranasal sinuses and mastoids are clear. Trace retained secretions in the nasopharynx. Visualized scalp soft tissues are within normal limits.  MRI CERVICAL SPINE FINDINGS  Study is intermittently degraded by motion artifact despite repeated imaging attempts.  Preserved cervical vertebral height and alignment. No marrow edema or evidence of acute osseous abnormality.  Cervicomedullary junction is within normal limits. Spinal cord signal is within normal limits at all visualized levels. Normal patency of the cervical thecal sac. There is Mild disc bulging at C5-C6. No cervical neural foraminal stenosis identified.  Negative paraspinal soft tissues. Major vascular flow voids in the neck are preserved, dominant right vertebral artery.  IMPRESSION: 1.  No acute intracranial abnormality. 2.  Continued abnormal T1 signal in the globus pallidi, as can be seen with hepatic insufficiency, cirrhosis, portosystemic shunt, portal vein thrombosis, total parenteral nutrition, and is less often with hyperglycemia or other metabolic disturbances such as Wilson's disease. Correlation with serum ammonia levels and liver function tests is recommended. 3. Negative cervical spine. Minimal/age congruent disc degeneration.   Electronically Signed   By: Augusto Gamble M.D.   On: 10/13/2013 16:57   Dg Chest Portable 1 View  10/13/2013   CLINICAL DATA:  Stroke symptoms  EXAM: PORTABLE CHEST - 1 VIEW  COMPARISON:  DG CHEST 1V PORT dated 10/07/2013  FINDINGS: The lungs are slightly less well inflated today than on the earlier study. There is no focal infiltrate. No area of atelectasis is demonstrated. The cardiopericardial silhouette remains enlarged. The  pulmonary vascularity is not engorged. There is minimal stable prominence of the interstitial markings. There is no pleural effusion.  IMPRESSION: There is mild bilateral pulmonary hypoinflation. There is no definite evidence of pneumonia nor atelectasis nor CHF.   Electronically Signed   By: David  [place]   On: 10/13/2013 13:46   Dg Chest Portable 1 View  10/07/2013   CLINICAL DATA:  Cough.  EXAM: PORTABLE CHEST - 1 VIEW  COMPARISON:  DG CHEST 2 VIEW dated 07/21/2013  FINDINGS: Mediastinum and hilar structures normal. Cardiomegaly with mild pulmonary venous prominence. No pleural effusion or pneumothorax. No focal pulmonary infiltrate.  IMPRESSION: Cardiomegaly with mild pulmonary venous congestion . No focal infiltrate .   Electronically Signed   By: Maisie Fus  Register   On: 10/07/2013 16:11    Microbiology: No results found for this or any previous visit (from the past 240 hour(s)).   Labs: Basic Metabolic Panel:  Recent Labs Lab 10/13/13 1216 10/13/13 1226 10/14/13 0500 10/15/13 0301 10/16/13 0520  NA 137 143 141 140 140  K 3.5* 3.3* 3.8 3.3* 3.3*  CL 104  104 110 109 111  CO2 21  --  22 20 22   GLUCOSE 135* 135* 113* 73 75  BUN 5* <3* 5* 4* 5*  CREATININE 0.51 0.60 0.42* 0.48* 0.44*  CALCIUM 8.3*  --  8.0* 8.0* 8.2*   Liver Function Tests:  Recent Labs Lab 10/13/13 1216 10/14/13 0500 10/15/13 0301  AST 50* 48* 43*  ALT 19 19 16   ALKPHOS 111 94 89  BILITOT 2.0* 1.9* 1.7*  PROT 8.1 7.4 6.8  ALBUMIN 2.5* 2.3* 2.1*   No results found for this basename: LIPASE, AMYLASE,  in the last 168 hours  Recent Labs Lab 10/13/13 1322 10/15/13 1645  AMMONIA 70* 52   CBC:  Recent Labs Lab 10/13/13 1216 10/13/13 1226 10/14/13 0500  WBC 8.3  --  9.2  NEUTROABS 3.9  --   --   HGB 12.2 14.3 11.5*  HCT 34.2* 42.0 31.9*  MCV 87.2  --  86.9  PLT 226  --  201   Cardiac Enzymes: No results found for this basename: CKTOTAL, CKMB, CKMBINDEX, TROPONINI,  in the last 168 hours BNP: BNP (last 3 results) No results found for this basename: PROBNP,  in the last 8760 hours CBG:  Recent Labs Lab 10/15/13 0728 10/15/13 1140 10/15/13 1636 10/15/13 2101 10/16/13 0727  GLUCAP 66* 123* 87 101* 73       Signed:  Zyla Dascenzo A Leatta Alewine  [facility] 10/16/2013, 11:04 AM

## 2013-10-16 NOTE — Progress Notes (Signed)
Physical Therapy Treatment Patient Details Name: Sabrina [name] MRN: [id number] DOB: 05-07-1956 Today's Date: 25-Oct-2013    History of Present Illness Patient presents with gait disturbance, confusion, slurred speech. Symptoms improved with Lactulose. MRI negative for acute stroke. Symptons thought to be due to hepatic encephalopathy. Pt with history of Crohn's, DM, bipolar, liver cirrhosis.    PT Comments    Pt doing well. Ready for dc home from PT standpoint. Advised pt to be careful if amb on uneven ground initially and to have husband with her if on uneven ground.  Follow Up Recommendations  No PT follow up     Equipment Recommendations  None recommended by PT    Recommendations for Other Services       Precautions / Restrictions Precautions Precautions: None    Mobility  Bed Mobility                  Transfers Overall transfer level: Modified independent   Transfers: Sit to/from Stand Sit to Stand: Modified independent (Device/Increase time)            Ambulation/Gait Ambulation/Gait assistance: Modified independent (Device/Increase time) Ambulation Distance (Feet): 250 Feet Assistive device: None;Straight cane Gait Pattern/deviations: Step-through pattern;Decreased stride length     General Gait Details: Pt with occasional posterior wt shift in stance but is able to self-correct. Cane didn't improve gait and actually tended to get in the way of her feet.   Stairs Stairs: Yes Stairs assistance: Supervision Stair Management: One rail Right;Alternating pattern;Forwards Number of Stairs: 12 General stair comments: No difficulty  Wheelchair Mobility    Modified Rankin (Stroke Patients Only)       Balance     Sitting balance-Leahy Scale: Normal       Standing balance-Leahy Scale: Good                      Cognition Arousal/Alertness: Awake/alert Behavior During Therapy: WFL for tasks assessed/performed Overall Cognitive  Status: Within Functional Limits for tasks assessed                      Exercises      General Comments        Pertinent Vitals/Pain N/A    Home Living               Home Equipment: Walker - 2 wheels      Prior Function            PT Goals (current goals can now be found in the care plan section) Progress towards PT goals: Goals met/education completed, patient discharged from PT    Frequency       PT Plan Other (comment) (Dc'd from PT)    Co-evaluation             End of Session   Activity Tolerance: Patient tolerated treatment well Patient left: in chair;with call bell/phone within reach     Time: 1055-1105 PT Time Calculation (min): 10 min  Charges:  $Gait Training: 8-22 mins                    G CodesShary Decamp Khalie Wince 2013/10/25, 11:18 AM  Suanne Marker PT [phone]

## 2013-10-16 NOTE — Progress Notes (Signed)
Physical Therapy Discharge Patient Details Name: Sabrina [name] MRN: [id number] DOB: 04/09/1956 Today's Date: 10/16/2013 Time: 2536-6440 PT Time Calculation (min): 10 min  Patient discharged from PT services secondary to goals met and no further PT needs identified.  Please see latest therapy progress note for current level of functioning and progress toward goals.    Progress and discharge plan discussed with patient and/or caregiver: Patient/Caregiver agrees with plan  GP     Shary Decamp [facility] 10/16/2013, 11:20 AM

## 2013-10-17 ENCOUNTER — Telehealth: Payer: Self-pay

## 2013-10-17 ENCOUNTER — Encounter: Payer: Self-pay | Admitting: Emergency Medicine

## 2013-10-17 ENCOUNTER — Telehealth: Payer: Self-pay | Admitting: Internal Medicine

## 2013-10-17 NOTE — Telephone Encounter (Signed)
Pt was taken to [facility] ED via EMS on 10/13/13 for increased confusion, blurry vision, and increased weakness (causing a change in gait and increased weakness on right side).  She was evaluated and treated for hepatic encephalopathy and liver failure.  She was discharged on 10/16/13 and was advised to follow up with her PCP and Dr. Marina Goodell.  Since discharge, patient states that she feels much better and is responding well to the lactulose.  She has increased strength and no longer has to walk with a walker.  Patient seems knowledgeable about her condition and knows the importance of taking her medications, especially her lactulose, as prescribed.  Information regarding lactulose and its side effects were discussed with the patient.  She was also encouraged to drink her Ensure as advised.    Medication and allergies:  Reviewed and updated New medications:  Reviewed and discussed Local pharmacy: [facility] on [address]  Personal, family history or past surgical hx:  Reviewed and updated  Hospital Follow-up appt scheduled for:  10/21/13 at 11:30 am.    To Discuss with Provider:  Pt has an appointment with Dr. Marina Goodell on June 8th and was told that she could not drive until after this appt.  Patient states she cannot wait this long and would like to speak to her PCP about this issue.

## 2013-10-17 NOTE — Telephone Encounter (Signed)
Left message for pt to call back  °

## 2013-10-17 NOTE — Telephone Encounter (Signed)
Pt was in the hospital and states she needs a 2 week follow-up appt. States she cannot drive until she has been seen. Pt scheduled to see Amy Esterwood PA 10/27/13@9am . Pt aware of appt.

## 2013-10-19 LAB — VITAMIN B6: VITAMIN B6: 11 ng/mL (ref 2.1–21.7)

## 2013-10-21 ENCOUNTER — Ambulatory Visit (INDEPENDENT_AMBULATORY_CARE_PROVIDER_SITE_OTHER): Payer: 59 | Admitting: Family Medicine

## 2013-10-21 ENCOUNTER — Encounter: Payer: Self-pay | Admitting: Family Medicine

## 2013-10-21 VITALS — BP 122/60 | HR 67 | Temp 98.2°F | Resp 16 | Wt 151.2 lb

## 2013-10-21 DIAGNOSIS — K7290 Hepatic failure, unspecified without coma: Secondary | ICD-10-CM

## 2013-10-21 DIAGNOSIS — L0232 Furuncle of buttock: Secondary | ICD-10-CM | POA: Insufficient documentation

## 2013-10-21 DIAGNOSIS — K7682 Hepatic encephalopathy: Secondary | ICD-10-CM

## 2013-10-21 DIAGNOSIS — K7460 Unspecified cirrhosis of liver: Secondary | ICD-10-CM

## 2013-10-21 LAB — VITAMIN B1: Vitamin B1 (Thiamine): 11 nmol/L (ref 8–30)

## 2013-10-21 MED ORDER — DOXYCYCLINE HYCLATE 100 MG PO TABS: 100.0000 mg | ORAL_TABLET | Freq: Two times a day (BID) | ORAL | Status: DC

## 2013-10-21 NOTE — Assessment & Plan Note (Signed)
New.  Start abx.  Reviewed supportive care and red flags that should prompt return.  Pt expressed understanding and is in agreement w/ plan.  

## 2013-10-21 NOTE — Progress Notes (Signed)
Pre visit review using our clinic review tool, if applicable. No additional management support is needed unless otherwise documented below in the visit note. 

## 2013-10-21 NOTE — Patient Instructions (Signed)
Follow up as needed Start the Doxycycline twice daily for 7 days for the buttock Warm compresses for pain relief Continue the Lactulose as directed You can drive! I will finish the FMLA papers today w/ you returning to work on modified schedule starting Monday and then regular schedule in 2 weeks Call with any questions or concerns I'm so glad you are feeling better!

## 2013-10-21 NOTE — Progress Notes (Signed)
   Subjective:    Patient ID: Sabrina QuailCynthia [name], female    DOB: 05-02-1956, 58 y.o.   MRN: [id number]  HPI Hospital f/u- pt was admitted on 4/16-4/19 w/ concern for stroke (inability to ambulate, confusion, inability to speak, facial drooping).  Was found to have hepatic encephalopathy rather than true CVA.  Now on Lactulose, feeling better.  Speaking and thinking clearly.  Walking w/o difficulty.  Pt would like to return to work next week but for 6 hrs/day for 4 days rather than 8 hr x5 days.  Has GI appt on 4/30  Butt boil- noted while in hospital.  Pt feels she misjudged sitting and struck it on arm of chair.  Area is painful, enlarging.  Not draining.   Review of Systems For ROS see HPI     Objective:   Physical Exam  Vitals reviewed. Constitutional: She is oriented to person, place, and time. She appears well-developed and well-nourished. No distress.  Cardiovascular: Normal rate and regular rhythm.   Murmur (III/VI SEM very loud at USB) heard. Pulmonary/Chest: Effort normal and breath sounds normal. No respiratory distress. She has no wheezes. She has no rales.  Musculoskeletal: She exhibits no edema.  Neurological: She is alert and oriented to person, place, and time. No cranial nerve deficit. Coordination normal.  Skin: Skin is warm and dry. There is erythema (2-3 cm area of firmness w/ mild overlying erythema, mildly TTP.  no fluctuance or obvious pus pocket).  Psychiatric: She has a normal mood and affect. Her behavior is normal. Thought content normal.          Assessment & Plan:

## 2013-10-24 NOTE — Telephone Encounter (Signed)
FYI

## 2013-10-24 NOTE — Telephone Encounter (Signed)
4.27.15 Caller name: Aram BeechamCynthia  Relation to pt: Call back number:[phone] Pharmacy:  Reason for call:  Pt states that they are not feeling any better and that they tried to return to work, but had to come home.  Pt's work wants disability paperwork filled out instead of the FMLA paperwork that was filled out.  Pt will be bringing this paperwork next week.  Pt just wanted Dr. Beverely Lowabori to know.

## 2013-10-27 ENCOUNTER — Other Ambulatory Visit (INDEPENDENT_AMBULATORY_CARE_PROVIDER_SITE_OTHER): Payer: 59

## 2013-10-27 ENCOUNTER — Encounter: Payer: Self-pay | Admitting: Family Medicine

## 2013-10-27 ENCOUNTER — Encounter: Payer: Self-pay | Admitting: Physician Assistant

## 2013-10-27 ENCOUNTER — Telehealth: Payer: Self-pay | Admitting: *Deleted

## 2013-10-27 ENCOUNTER — Ambulatory Visit (INDEPENDENT_AMBULATORY_CARE_PROVIDER_SITE_OTHER): Payer: 59 | Admitting: Physician Assistant

## 2013-10-27 VITALS — BP 108/72 | HR 72 | Ht 64.0 in | Wt 141.4 lb

## 2013-10-27 DIAGNOSIS — K7290 Hepatic failure, unspecified without coma: Secondary | ICD-10-CM

## 2013-10-27 DIAGNOSIS — K7460 Unspecified cirrhosis of liver: Secondary | ICD-10-CM

## 2013-10-27 DIAGNOSIS — K7682 Hepatic encephalopathy: Secondary | ICD-10-CM

## 2013-10-27 LAB — CBC WITH DIFFERENTIAL/PLATELET
BASOS PCT: 0.3 % (ref 0.0–3.0)
Basophils Absolute: 0 10*3/uL (ref 0.0–0.1)
Eosinophils Absolute: 0 10*3/uL (ref 0.0–0.7)
Eosinophils Relative: 0.1 % (ref 0.0–5.0)
HCT: 39.1 % (ref 36.0–46.0)
HEMOGLOBIN: 12.9 g/dL (ref 12.0–15.0)
Lymphocytes Relative: 29.6 % (ref 12.0–46.0)
Lymphs Abs: 2.3 10*3/uL (ref 0.7–4.0)
MCHC: 33 g/dL (ref 30.0–36.0)
MCV: 96.2 fl (ref 78.0–100.0)
MONO ABS: 0.8 10*3/uL (ref 0.1–1.0)
Monocytes Relative: 9.8 % (ref 3.0–12.0)
NEUTROS ABS: 4.7 10*3/uL (ref 1.4–7.7)
Neutrophils Relative %: 60.2 % (ref 43.0–77.0)
Platelets: 258 10*3/uL (ref 150.0–400.0)
RBC: 4.07 Mil/uL (ref 3.87–5.11)
RDW: 16.1 % — ABNORMAL HIGH (ref 11.5–14.6)
WBC: 7.9 10*3/uL (ref 4.5–10.5)

## 2013-10-27 LAB — COMPREHENSIVE METABOLIC PANEL
ALBUMIN: 2.6 g/dL — AB (ref 3.5–5.2)
ALK PHOS: 98 U/L (ref 39–117)
ALT: 27 U/L (ref 0–35)
AST: 62 U/L — ABNORMAL HIGH (ref 0–37)
BUN: 6 mg/dL (ref 6–23)
CHLORIDE: 104 meq/L (ref 96–112)
CO2: 28 mEq/L (ref 19–32)
CREATININE: 0.6 mg/dL (ref 0.4–1.2)
Calcium: 9 mg/dL (ref 8.4–10.5)
GFR: 102.68 mL/min (ref >60.00)
Glucose, Bld: 175 mg/dL — ABNORMAL HIGH (ref 70–99)
Potassium: 4.5 mEq/L (ref 3.5–5.1)
Sodium: 136 mEq/L (ref 135–145)
Total Bilirubin: 1.8 mg/dL — ABNORMAL HIGH (ref 0.3–1.2)
Total Protein: 8.2 g/dL (ref 6.0–8.3)

## 2013-10-27 LAB — AMMONIA: AMMONIA: 49 umol/L — AB (ref 11–35)

## 2013-10-27 MED ORDER — RIFAXIMIN 550 MG PO TABS: 550.0000 mg | ORAL_TABLET | Freq: Two times a day (BID) | ORAL | Status: AC

## 2013-10-27 MED ORDER — LACTULOSE 10 G PO PACK: PACK | ORAL | Status: DC

## 2013-10-27 NOTE — Progress Notes (Signed)
Subjective:    Patient ID: Sabrina [name], female    DOB: 16-Dec-1955, 58 y.o.   MRN: [id number]  HPI  [name] is a 58 year old white female known to Dr. [name]. She has history of Crohn's colitis, celiac disease and had a gastric ulcer with hemorrhage in July of 2014. This was followed to healing with last EGD done in January of 2015 showing a healed ulcer there is no evidence of varices. She also had undergone a splenectomy and cholecystectomy in August of 2014 and was noted at that time to have a cirrhotic-appearing liver. 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 She did have ascites in the fall of 2014 and had a paracentesis she has been on diuretics and has no appreciable ongoing volume overload. She had a recent admission in mid April 2015 with confusion and an unsteady gait.  Also is having difficulty with word finding. She was admitted to the hospitalist service and was seen in consultation by GI. She also had an admission in January of 2015 with possible TIA. Workup was done with MRI as of the head and this showed no evidence of acute intracranial abnormality she had abnormal T1 signal in the globus pallidi. as can be seen with hepatic insufficiency cirrhosis porta systemic shunting portal vein thrombosis or less, and Wilson's disease or other metabolic disturbance.  She did have a moderately elevated venous ammonia level on admission of 70. She responded to lactulose in the hospital and it was felt that her diagnosis was consistent with hepatic encephalopathy. She was discharged home on Chronulac 10 g twice daily but was having a lot of bowel movements at the time of discharge.  Patient comes back in today for followup stating that she's having at least 10 bowel movements every day with some urgency and a lot of rumbling and growling in her abdomen after with the lactulose. She says she's still  having problems with confusion and slow mentation. She tried to go back to work earlier this week but only lasted apparently for 2 hours. She had difficulty trying to use a computer. Her husband confirms that she is mentating more slowly than normal for her has difficulty finding words and difficulty operating things such as a telephone. She also feels unsteady and wobbly on her feet. She does not have any unilateral weakness etc. They both confirm that she's been more sleepy than usual as well. She did drive to the office this morning. Her husband apparently does not have a driver's license.  Husband reports that she had her Abilify dose cut in half because of the problems with confusion and that she's gradually been weaned off of Abilify Lamictal was apparently stopped 3 weeks ago.    Review of Systems  HENT: Negative.   Eyes: Negative.   Respiratory: Negative.   Cardiovascular: Negative.   Gastrointestinal: Negative.   Endocrine: Negative.   Genitourinary: Negative.   Musculoskeletal: Negative.   Skin: Negative.   Allergic/Immunologic: Negative.   Neurological: Positive for speech difficulty and weakness.  Hematological: Negative.   Psychiatric/Behavioral: Positive for confusion and decreased concentration.   Outpatient Prescriptions Prior to Visit  Medication Sig Dispense Refill  . ARIPiprazole (ABILIFY) 5 MG tablet Take 2.5 mg by mouth daily.       Marland Kitchen aspirin 81 MG tablet Take 1 tablet (81 mg total) by mouth daily.      . cetirizine (ZYRTEC) 10 MG  tablet Take 10 mg by mouth daily.      Marland Kitchen. doxycycline (VIBRA-TABS) 100 MG tablet Take 1 tablet (100 mg total) by mouth 2 (two) times daily.  14 tablet  0  . feeding supplement, ENSURE COMPLETE, (ENSURE COMPLETE) LIQD Take 237 mLs by mouth 2 (two) times daily between meals.  30 Bottle  0  . FLUoxetine (PROZAC) 40 MG capsule Take 40 mg by mouth every morning.       . furosemide (LASIX) 20 MG tablet Take 20 mg by mouth daily.      . iron  polysaccharides (POLY-IRON 150) 150 MG capsule Take 1 capsule (150 mg total) by mouth 2 (two) times daily.  180 capsule  3  . Loperamide HCl (IMODIUM PO) Take 2 tablets by mouth 2 (two) times daily as needed (diarrhea).      . LORazepam (ATIVAN) 0.5 MG tablet Take 0.5 mg by mouth 2 (two) times daily.       . mesalamine (LIALDA) 1.2 G EC tablet Take 1.2 g by mouth 2 (two) times daily.      . Multiple Vitamins-Minerals (MULTIVITAMIN WITH MINERALS) tablet Take 1 tablet by mouth daily.      Marland Kitchen. omeprazole (PRILOSEC) 40 MG capsule Take 40 mg by mouth daily.      . pentosan polysulfate (ELMIRON) 100 MG capsule Take 200 mg by mouth 2 (two) times daily.       . traZODone (DESYREL) 50 MG tablet Take 50 mg by mouth at bedtime.      Marland Kitchen. lactulose (CEPHULAC) 10 G packet Take 1 packet (10 g total) by mouth 2 (two) times daily.  30 each  0  . rifaximin (XIFAXAN) 550 MG TABS tablet Take 1 tablet (550 mg total) by mouth 2 (two) times daily.  60 tablet  0   No facility-administered medications prior to visit.   Allergies  Allergen Reactions  . Sulfonamide Derivatives     Classic angioedema reaction  . Wheat Bran     Celiac's disease   Patient Active Problem List   Diagnosis Date Noted  . Boil, buttock 10/21/2013  . Encephalopathy, hepatic 10/14/2013  . Stress at work 10/13/2013  . Abnormality of gait 10/11/2013  . Tremors of nervous system 10/11/2013  . Abnormal LFTs 10/11/2013  . Other malaise and fatigue 08/22/2013  . Hypoglycemia 07/22/2013  . Undiagnosed cardiac murmurs 07/22/2013  . TIA (transient ischemic attack) 07/21/2013  . Coagulopathy 07/21/2013  . Diabetes mellitus type 2, noninsulin dependent 07/21/2013  . Ascites 03/25/2013  . Cirrhosis of liver 03/25/2013  . 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 Protein-calorie malnutrition, severe 02/25/2013  . 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 Gastric ulcer with hemorrhage 10/30/2012  . Edema 10/29/2012  . Chest pain 10/29/2012  . Hypokalemia  10/29/2012  . Abnormal computed tomography of thoracic spine 09/22/2012  . Unspecified vitamin D deficiency 08/18/2012  . Recurrent aphthous ulcer 12/22/2011  . UTI (lower urinary tract infection) 09/25/2011  . Eustachian tube dysfunction 07/14/2011  . Aortic stenosis 03/20/2011  . Foot pain 12/26/2010  . BIPOLAR AFFECTIVE DISORDER 07/09/2010  . MACULAR DEGENERATION 07/09/2010  . INTERSTITIAL CYSTITIS 07/09/2010  . CROHN'S DISEASE-LARGE INTESTINE 01/10/2009  . DIABETES MELLITUS 08/04/2007  . HYPERLIPIDEMIA 08/04/2007  . ANXIETY DEPRESSION 08/04/2007  . HYPERTENSION 08/04/2007  . ALLERGIC RHINITIS 08/04/2007  . CELIAC DISEASE 08/04/2007  . ARTHRITIS 08/04/2007  . DEGENERATIVE DISC DISEASE 08/04/2007   History  Substance Use Topics  . Smoking status: Never Smoker   . Smokeless tobacco: Never  Used  . Alcohol Use: No   family history includes Diabetes in her brother and mother; Hyperlipidemia in her brother; Hypertension in her brother, mother, and another family member. There is no history of Colon cancer.     Objective:   Physical Exam  Well-developed white female in no acute distress accompanied by her husband. Blood pressure 108 /72 pulse 72 height 5 foot 4 weight 141. HEENT nontraumatic normocephalic EOMI PERRLA sclera anicteric, Supple no JVD, Cardiovascular regular rate and rhythm with S1-S2 she does have a systolic murmur pulmonary clear bilaterally, Abdomen soft no appreciable fluid wave bowel sounds are present nontender, Rectal exam not done, Extremities trace edema in the feet, Neuro/psychiatric mood and affect appropriate, mentation is slow but appropriate, oriented x3 no appreciable asterixis, and gait was not tested, affect is flat        IMP/PLAN:   #35   58 year old female with diagnosis of decompensated cirrhosis etiology not clear rule out [name] versus autoimmune now with complication of apparent hepatic encephalopathy and recent admission with confusion.  Patient  continues to have symptoms despite taking Chronulac twice daily which she apparently responded to in the hospital. She is having numerous bowel movements per day  I have  some concern that her symptoms may not all be from hepatic encephalopathy. She has history of bipolar disorder and meds are being decreased or stopped. Question other underlying neurologic disorder. Her mentation is a bit slow but she is certainly appropriate and has no asterixis et Karie Soda today  #2 history of gastric ulcer with hemorrhage followed to healing at EGD January 2015  #3 status post cholecystectomy and splenectomy 2014  #4 celiac disease  #5 Crohn's colitis currently asymptomatic  #6 diabetes mellitus  #7 bipolar disorder with depression   Plan ; check CBC with differential, CMET and venous ammonia today  Decrease Chronulac to 10 g once daily as she is having up to 10 bowel movements per day currently  Add Xifaxan 550 twice daily samples and prescription given  Patient was advised she should not be driving at this point  Will get her a followup appointment with neurology she had seen Dr. Pearlean Brownie  in the past. She may need further neurologic workup to rule out any other neurologic disorder, dementia etc.  Followup with Dr. Marina Goodell in 3-4 weeks

## 2013-10-27 NOTE — Patient Instructions (Signed)
Please go to the basement level to have your labs drawn.  We made you a follow up appointment with Dr. Yancey FlemingsJohn Perry on 11-15-2013 at 2;15 PM.   We will call you when we get the appointment with Dr. Marlis EdelsonSethi's office, Neurology.  We sent [facility]ills [facility] Xifaxan and [facility]ulose packets [facility].

## 2013-10-27 NOTE — Progress Notes (Signed)
Not sure this is liver either. Needs neurology evaluation before returning to GI

## 2013-10-27 NOTE — Telephone Encounter (Signed)
Pam with [facility] calling to see if can get pt in within the next 2 wks, for confusion and gait imbalance.  Last seen 10-11-13 by Dr. Pearlean Brownie, then also seen by neuro at [facility] 10-13-13.  MRI's (brain and cervical) done when in [facility].  Will send message to Dr. Pearlean Brownie.  If questions to call Mike Gip, PA with GI  [phone].

## 2013-10-28 ENCOUNTER — Ambulatory Visit: Payer: 59 | Admitting: Family Medicine

## 2013-10-28 NOTE — Telephone Encounter (Signed)
I consulted Dr. Pearlean Brownie yesterday and he is to call Amy PA. [phone].  I placed notes with phone # at his desk at 1245 today.

## 2013-10-28 NOTE — Telephone Encounter (Signed)
I called and left message for Amy to call me back to discuss.

## 2013-10-31 NOTE — Telephone Encounter (Signed)
Per Dr. Pearlean Brownie awaiting Amy's call back.

## 2013-11-01 ENCOUNTER — Telehealth: Payer: Self-pay | Admitting: Physician Assistant

## 2013-11-01 NOTE — Telephone Encounter (Signed)
Yes, we were trying to get her in sooner to see him- guess he called Friday when I was not working-will call him tomorrow- so let her know still in progress

## 2013-11-01 NOTE — Telephone Encounter (Signed)
Amy do you know anything about the neuro appt for this pt? Per phone note dated 10/27/13 Dr. Pearlean Brownie is waiting on a call back. Please advise.

## 2013-11-02 ENCOUNTER — Ambulatory Visit: Payer: Self-pay | Admitting: Neurology

## 2013-11-02 DIAGNOSIS — Z0279 Encounter for issue of other medical certificate: Secondary | ICD-10-CM

## 2013-11-02 NOTE — Telephone Encounter (Signed)
Spoke with pt and she is aware.

## 2013-11-03 ENCOUNTER — Encounter: Payer: Self-pay | Admitting: Family Medicine

## 2013-11-04 DIAGNOSIS — Z0279 Encounter for issue of other medical certificate: Secondary | ICD-10-CM

## 2013-11-04 NOTE — Telephone Encounter (Signed)
Disability forms were brought to my attention today. Forms filled out as much as possible and given to Dr. Beverely Low for completion. JG//CMA

## 2013-11-04 NOTE — Telephone Encounter (Signed)
Received completed/signed forms back. Called patient and she requested that forms be faxed to her employer and a copy for her be placed up front for pick up. All completed. JG//CMA

## 2013-11-07 ENCOUNTER — Telehealth: Payer: Self-pay | Admitting: Internal Medicine

## 2013-11-07 NOTE — Telephone Encounter (Signed)
As long as she is taking Xifaxan twice daily- she can stop the lactulose for a few days- then try to go back to 30 cc once daily- i communicated with Dr. Pearlean Brownie- he sais they would get her worked in-  Can we call and try to get her an appt again- thank you.. Reason for appt.. Confusion, decline in mental function r/o other neurological disorder, hx hepatic encephalopathy

## 2013-11-07 NOTE — Telephone Encounter (Signed)
Pt states that she is having about 20 BM's/day, states the lactulose is about to do her in. States the 20 some stools started on Saturday. Pt states she is only taking the lactulose once a day in the morning. Pt also wants to know if we have heard anything about the neurology referral. Please advise.

## 2013-11-07 NOTE — Telephone Encounter (Signed)
Spoke with pt and she is aware. Call placed to Dr. Marlis EdelsonSethi's office and they are still trying to work the pt in. Pt aware.

## 2013-11-09 ENCOUNTER — Encounter: Payer: Self-pay | Admitting: Neurology

## 2013-11-09 ENCOUNTER — Ambulatory Visit (INDEPENDENT_AMBULATORY_CARE_PROVIDER_SITE_OTHER): Payer: 59 | Admitting: Neurology

## 2013-11-09 VITALS — BP 119/63 | HR 65 | Ht 64.0 in | Wt 146.0 lb

## 2013-11-09 DIAGNOSIS — K7460 Unspecified cirrhosis of liver: Secondary | ICD-10-CM

## 2013-11-09 NOTE — Patient Instructions (Signed)
I had a long discussion the patient her friend regarding results of her evaluation during recent hospital stay, discuss her hyperammonemia and related symptoms and answered questions. The patient unfortunately this has not been able to tolerate lactulose with significant diarrhea. Check ammonia level today and if it is elevated may try half a packet of lactulose daily if tolerated without significant diarrhea. She was advised to keep her appointment with gastroenterologist Dr. Marina GoodellPerry next week and discuss alternative treatment options for elevated ammonia. Return for followup in 3 months or call earlier if necessary.

## 2013-11-10 LAB — AMMONIA: AMMONIA: 101 ug/dL — AB (ref 19–87)

## 2013-11-10 NOTE — Progress Notes (Signed)
[facility] [address] [place]. [place] [address] [phone]       OFFICE FOLLOW-UP NOTE  Sabrina. Sabrina [name] Date of Birth:  1955-09-10 Medical Record Number:  [id number]   HPI: Sabrina [name] is a 17 year Caucasian lady who who had a scheduled appointment to see me today for followup from hospital consultation for TIA in January 2015 however she has a multitude of totally new complaints today which required detail evaluation. She initially was admitted on 07/21/13 with symptoms of transient left-sided weakness upon awakening in the morning. The symptoms resolved shortly after arrival exam was nonfocal. CT head was negative. MRI scan did not reveal any acute infarct. It did show some T1 hyperintensities in the basal ganglia consistent with patient's known liver cirrhosis. Lipid profile is significant only for LDL 109, HbA1c was normal.4. Patient started Aspirin for stroke prevention. Carotid ultrasound and 2-D echo were both normal. 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 She had bone marrow biopsy which was nondiagnostic. She has been seeing gastroenterologist Dr. Henrene Pastor etiology for liver cirrhosis unclear wether NASH or autoimmune or not. Patient does states that since discharge from the hospital last   she's had progressive symptoms of confusion, memory difficulties, or generalized weakness, tremors, gait and balance problems. She is required to walk with a walker to avoid falls or injuries. She states that the tremulousness in her hands and feet was so severe that she was seen in the emergency room on 04/08/14. Ct head was  unremarkable. Labs were significant for decreased glucose of 64 and she was felt to be hypoglycemic and treated. Bilirubin was elevated at 1.7 and liver enzymes were all slightly elevated. The patient had been started on Lamotrigine 4 weeks ago and this was discontinued thinking it may be  responsible for symptoms. The patient states her symptoms are persistent spread stopping lamotrigine. She hasn't been on aripiprazole for bipolar disorder for at least a year and she sees a psychiatrist Dr. Chucky May. The patient denies any significant fall head or neck injury neck pain, radicular pain, numbness tingling or burning in her feet. She is no trouble controlling her bowels or bladders. She has not had any spine imaging done. Update 11/09/2013 : She returns for f/u after last visit 1 month ago accompanied by her friend. She was hospitalized at [facility] for a week and diagnosed with hyperammonemia  And showed significant improvement in cognition, tremors, dizziness , gait and balance after treatment with lactulose and normalization of ammonia levels.She is also on Rifaximin. She is on 10 gm lactulose daily but having 20-22 bowel movements per day and cannot function. She can walk safely but uses a walker rarely.She has not had any falls.She saw Dr Henrene Pastor last week. 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 ROS:   14 system review of systems is positive for confusion, memory loss, dizziness, numbness, speech difficulty, weakness, tremors, facial droop, agitation confusion, decreased concentration, anxiety, depression, walking difficulty, neck stiffness, daytime sleepiness, restless legs, excessive thirst, fatigue, cough, leg swelling, or murmur.  PMH:  Past Medical History  Diagnosis Date  . Arthritis   . Allergic rhinitis   . Colitis   . Degenerative disc disease   . Hyperlipidemia   . Hypertension   . Celiac disease   .  Bipolar disorder   . Macular degeneration   . Interstitial cystitis   . Heart murmur   . Anxiety   . Depression   . Liver disease   . Gastric ulcer   . TIA (transient ischemic attack) 06/2013; ?10/13/2013  . Type II diabetes mellitus   .  Anemia, iron deficiency 10/30/2012  . History of blood transfusion     "maybe 3-4; related to low counts" (10/13/2013)  . Hepatic encephalopathy   . Crohn's disease     Social History:  History   Social History  . Marital Status: Married    Spouse Name: N/A    Number of Children: 1  . Years of Education: N/A   Occupational History  . TRANSCRIPTIONIST --[employer]    Social History Main Topics  . Smoking status: Never Smoker   . Smokeless tobacco: Never Used  . Alcohol Use: No  . Drug Use: No  . Sexual Activity: No   Other Topics Concern  . Not on file   Social History Narrative   Daughter lives in [place], [place]    Medications:   Current Outpatient Prescriptions on File Prior to Visit  Medication Sig Dispense Refill  . aspirin 81 MG tablet Take 1 tablet (81 mg total) by mouth daily.      . cetirizine (ZYRTEC) 10 MG tablet Take 10 mg by mouth daily.      . feeding supplement, ENSURE COMPLETE, (ENSURE COMPLETE) LIQD Take 237 mLs by mouth 2 (two) times daily between meals.  30 Bottle  0  . FLUoxetine (PROZAC) 40 MG capsule Take 40 mg by mouth every morning.       . furosemide (LASIX) 20 MG tablet Take 20 mg by mouth daily.      . iron polysaccharides (POLY-IRON 150) 150 MG capsule Take 1 capsule (150 mg total) by mouth 2 (two) times daily.  180 capsule  3  . lactulose (CEPHULAC) 10 G packet Take 1 packet per day.  30 each  3  . LORazepam (ATIVAN) 0.5 MG tablet Take 0.5 mg by mouth 2 (two) times daily.       . mesalamine (LIALDA) 1.2 G EC tablet Take 1.2 g by mouth 2 (two) times daily.      . Multiple Vitamins-Minerals (MULTIVITAMIN WITH MINERALS) tablet Take 1 tablet by mouth daily.      Marland Kitchen omeprazole (PRILOSEC) 40 MG capsule Take 40 mg by mouth daily.      . pentosan polysulfate (ELMIRON) 100 MG capsule Take 200 mg by mouth 2 (two) times daily.       . rifaximin (XIFAXAN) 550 MG TABS tablet Take 1 tablet (550 mg total) by mouth 2 (two) times daily.  60 tablet  11  .  traZODone (DESYREL) 50 MG tablet Take 50 mg by mouth at bedtime.       No current facility-administered medications on file prior to visit.    Allergies:   Allergies  Allergen Reactions  . Sulfonamide Derivatives     Classic angioedema reaction  . Wheat Bran     Celiac's disease    Physical Exam General: Frail middle-aged Caucasian lady, seated, in no evident distress Head: head normocephalic and atraumatic. Orohparynx benign Neck: supple with no carotid or supraclavicular bruits Cardiovascular: regular rate and rhythm, no murmurs Musculoskeletal: no deformity Skin:  no rash/petichiae. No jaundice on conjunctiva Vascular:  Normal pulses all extremities Filed Vitals:   11/09/13 1306  BP: 119/63  Pulse: 65   Neurologic Exam  Mental Status: Awake and fully alert. Oriented to place and time. Recent and remote memory intact. Attention span, concentration and fund of knowledge limited. Mood and affect appropriate. Did not do Clock drawing. Mini-Mental status exam  Cranial Nerves: Fundoscopic exam reveals sharp disc margins. Pupils equal, briskly reactive to light. Extraocular movements full without nystagmus. Visual fields full to confrontation. Hearing intact. Facial sensation intact. Face, tongue, palate moves normally and symmetrically. Mild mast face is present. Decreased facial expression. Motor: Normal bulk and tone. Normal strength in all tested extremity muscles. No asterixis in both hands. No resting tremor. Minimum intermittent action tremor of the hands. No cogwheel rigidity. Mild bradykinesia present. No drooling of saliva. Sensory.: intact to touch and pinprick and vibratory sensation.  Coordination: Rapid alternating movements normal in all extremities. Finger-to-nose and heel-to-shin performed accurately bilaterally. Gait and Station: Arises from chair with  difficulty. Stance is broadbased. Ataxic gait with short steps with some festination. Poor postural balance with  retropulsion and would fall if not caught Reflexes: 3+ brisk and symmetric except ankle jerks 1+. No clonus.. Toes downgoing.      ASSESSMENT: 46 year caucasian lady with subacute worsening of gait, balance,confusion, tremors and frequent falls from hyperammonemia from liver cirrhosis .   PLAN:  I had a long discussion the patient her friend regarding results of her evaluation during recent hospital stay, discuss her hyperammonemia and related symptoms and answered questions. The patient unfortunately this has not been able to tolerate lactulose with significant diarrhea. Check ammonia level today and if it is elevated may try half a packet of lactulose daily if tolerated without significant diarrhea. She was advised to keep her appointment with gastroenterologist Dr. Henrene Pastor next week and discuss alternative treatment options for elevated ammonia. Return for followup in 3 months or call earlier if necessary.    Note: This document was prepared with digital dictation and possible smart phrase technology. Any transcriptional errors that result from this process are unintentional

## 2013-11-14 ENCOUNTER — Telehealth: Payer: Self-pay | Admitting: Neurology

## 2013-11-14 ENCOUNTER — Other Ambulatory Visit: Payer: Self-pay | Admitting: Internal Medicine

## 2013-11-14 DIAGNOSIS — K7682 Hepatic encephalopathy: Secondary | ICD-10-CM

## 2013-11-14 DIAGNOSIS — K7460 Unspecified cirrhosis of liver: Secondary | ICD-10-CM

## 2013-11-14 DIAGNOSIS — L0233 Carbuncle of buttock: Secondary | ICD-10-CM

## 2013-11-14 DIAGNOSIS — K7290 Hepatic failure, unspecified without coma: Secondary | ICD-10-CM

## 2013-11-14 NOTE — Telephone Encounter (Signed)
Patient requesting results of recent labs. Please call to advise.

## 2013-11-14 NOTE — Telephone Encounter (Signed)
Ammonia elevated. Try 1/2 dose lactulose? Per your note?

## 2013-11-14 NOTE — Telephone Encounter (Signed)
yes

## 2013-11-15 ENCOUNTER — Encounter: Payer: Self-pay | Admitting: Internal Medicine

## 2013-11-15 ENCOUNTER — Ambulatory Visit (INDEPENDENT_AMBULATORY_CARE_PROVIDER_SITE_OTHER): Payer: 59 | Admitting: Internal Medicine

## 2013-11-15 ENCOUNTER — Telehealth: Payer: Self-pay

## 2013-11-15 ENCOUNTER — Other Ambulatory Visit (INDEPENDENT_AMBULATORY_CARE_PROVIDER_SITE_OTHER): Payer: 59

## 2013-11-15 VITALS — BP 130/64 | HR 88 | Ht 64.0 in | Wt 149.0 lb

## 2013-11-15 DIAGNOSIS — K7290 Hepatic failure, unspecified without coma: Secondary | ICD-10-CM

## 2013-11-15 DIAGNOSIS — K259 Gastric ulcer, unspecified as acute or chronic, without hemorrhage or perforation: Secondary | ICD-10-CM

## 2013-11-15 DIAGNOSIS — K7682 Hepatic encephalopathy: Secondary | ICD-10-CM

## 2013-11-15 DIAGNOSIS — K7460 Unspecified cirrhosis of liver: Secondary | ICD-10-CM

## 2013-11-15 DIAGNOSIS — K5090 Crohn's disease, unspecified, without complications: Secondary | ICD-10-CM

## 2013-11-15 DIAGNOSIS — K900 Celiac disease: Secondary | ICD-10-CM

## 2013-11-15 LAB — BASIC METABOLIC PANEL
BUN: 8 mg/dL (ref 6–23)
CALCIUM: 8.6 mg/dL (ref 8.4–10.5)
CO2: 28 mEq/L (ref 19–32)
CREATININE: 0.7 mg/dL (ref 0.4–1.2)
Chloride: 105 mEq/L (ref 96–112)
GFR: 99.03 mL/min (ref >60.00)
Glucose, Bld: 130 mg/dL — ABNORMAL HIGH (ref 70–99)
Potassium: 4.2 mEq/L (ref 3.5–5.1)
Sodium: 134 mEq/L — ABNORMAL LOW (ref 135–145)

## 2013-11-15 LAB — HEPATIC FUNCTION PANEL
ALT: 27 U/L (ref 0–35)
AST: 66 U/L — ABNORMAL HIGH (ref 0–37)
Albumin: 2.5 g/dL — ABNORMAL LOW (ref 3.5–5.2)
Alkaline Phosphatase: 93 U/L (ref 39–117)
BILIRUBIN DIRECT: 0.6 mg/dL — AB (ref 0.0–0.3)
Total Bilirubin: 2.3 mg/dL — ABNORMAL HIGH (ref 0.2–1.2)
Total Protein: 8.1 g/dL (ref 6.0–8.3)

## 2013-11-15 LAB — AMMONIA: Ammonia: 35 umol/L (ref 11–35)

## 2013-11-15 LAB — PROTIME-INR
INR: 2.2 ratio — ABNORMAL HIGH (ref 0.8–1.0)
PROTHROMBIN TIME: 24.4 s — AB (ref 9.6–13.1)

## 2013-11-15 NOTE — Telephone Encounter (Signed)
Message copied by Jeanine LuzWRENN, Blade Scheff K on Tue Nov 15, 2013  8:35 AM ------      Message from: Hilarie FredricksonPERRY, JOHN N      Created: Mon Nov 14, 2013  6:12 PM       Ok to refill. Thanks       ----- Message -----         From: Jeanine LuzLeslie K Tully Mcinturff, CMA         Sent: 11/14/2013   3:46 PM           To: Hilarie FredricksonJohn N Perry, MD            Received a pharmacy refill request for Lasix 20mg  daily.  In office note dated 09/05/2013 you acknowledged this medication and instructed her to continue all her medications going forward.  However, it does not appear we are the ones who prescribe it for her.  Ok to refill or should she go through her pcp?       ------

## 2013-11-15 NOTE — Telephone Encounter (Signed)
Patient calling back regarding lab results.  Need results before noon today.

## 2013-11-15 NOTE — Telephone Encounter (Signed)
Pt [facility] level [facility]ry [facility]  Pt stated will be taking 1/2 dose lactulose and xifaxan 550mg  po bid and see how she does.  Has appt with Dr. Marina GoodellPerry again in 2 wks.  Will see Dr. Pearlean BrownieSethi in August. Pt to call back as needed.  Noted Ammonia 35 from Dr. Marina GoodellPerry visit. (not relayed to pt at this time).

## 2013-11-15 NOTE — Progress Notes (Signed)
HISTORY OF PRESENT ILLNESS:  Sabrina [name] is a 58 y.o. female with a very complicated past medical history who was initially followed in this office for mild Crohn's colitis and celiac disease. 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 She has not done particularly well since that time. Initial problems included ascites in volume overload related to decompensated cirrhosis. Earlier this year possible TIA. More recently multifactorial neurologic complaints thought possibly related to hepatic encephalopathy. She was seen by the advanced practitioner 3 weeks ago regarding the same. It was unclear to [place] at that time that her problems were strictly attributable to hepatic encephalopathy. Some of the features for atypical. As well there was no asterixis and incomplete response to lactulose. She was continued on lactulose and Xifaxan 550 mg twice a day added. She did undergo neurology consultation 11/09/2013. No diagnoses other than hepatic encephalopathy were considered. She has had very mild elevation of venous ammonia levels. She is accompanied today by her friend. She had been having 20 bowel movements per day. Half dose lactulose now results and 5 bowel movements per day. She is compliant with Xifaxan. She has had issues with hypokalemia in the past. This is secondary to furosemide. She also has a history of bipolar disorder. Has had her psych medications adjusted in recent months. She is currently out of work and not driving indefinitely, as instructed. She reports compliant with her celiac diet. No new GI complaints. Still with multiple neurologic complaints including tremor, ataxia. Confusion has resolved. She inquires about possible liver transplant  REVIEW OF SYSTEMS:  All non-GI ROS negative except for sinus and allergy, anxiety, arthritis, confusion,  depression, fatigue, heart murmur, urinary leakage  Past Medical History  Diagnosis Date  . Arthritis   . Allergic rhinitis   . Colitis   . Degenerative disc disease   . Hyperlipidemia   . Hypertension   . Celiac disease   . Bipolar disorder   . Macular degeneration   . Interstitial cystitis   . Heart murmur   . Anxiety   . Depression   . Liver disease   . Gastric ulcer   . TIA (transient ischemic attack) 06/2013; ?10/13/2013  . Type II diabetes mellitus   . Anemia, iron deficiency 10/30/2012  . History of blood transfusion     "maybe 3-4; related to low counts" (10/13/2013)  . Hepatic encephalopathy   . Crohn's disease   . Cirrhosis     Past Surgical History  Procedure Laterality Date  . Appendectomy    . Lumbar laminectomy    . Carpal tunnel release Bilateral   . Cesarean section  1983  . Cysto/ hod/ instillatio clorpactin  07-03-2008  &  06-09-2001    INTERSTITIAL CYSTITIS  . Left shoulder /   including capsulectomy with debridement/  sad with resection of ac Left 01-25-2008  . Right shoulder arthroscopy/ open resection distal clavicle/ debridement adhesive capsulitis/ rotator cuff repair Right 08-30-2003  . Tonsillectomy and adenoidectomy    . Transthoracic echocardiogram  03-28-2011    NORMAL LVSF/ EF 60-65%/ VERY MILD AORTIC STENOSIS, TRIVIAL REGURG./ MILDLY DILATED LEFT ATRIUM  . Cystoscopy  11/26/2011    Procedure: CYSTOSCOPY;  Surgeon: Sabrina Fuller, MD;  Location: [facility];  Service: Urology;  Laterality: N/A;  clorpactin  in bladder  . Splenectomy, total N/A 02/23/2013    Procedure: OPEN SPLENECTOMY AND CHOLECYSTECTOMY ;  Surgeon: Sabrina MentionHaywood M Ingram, MD;  Location: [facility];  Service: General;  Laterality: N/A;  . Cholecystectomy    . Tubal ligation  1983  . Back surgery      Social History Sabrina QuailCynthia [name]  reports that she has never smoked. She has never used smokeless tobacco. She reports that she does not drink alcohol or use illicit  drugs.  family history includes Diabetes in her brother and mother; Hyperlipidemia in her brother; Hypertension in her brother, mother, and another family member. There is no history of Colon cancer.  Allergies  Allergen Reactions  . Sulfonamide Derivatives     Classic angioedema reaction  . Wheat Bran     Celiac's disease       PHYSICAL EXAMINATION: Vital signs: BP 130/64  Pulse 88  Ht 5\' 4"  (1.626 m)  Wt 149 lb (67.586 kg)  BMI 25.56 kg/m2  Constitutional: Chronically ill well-appearing, no acute distress Psychiatric: alert and oriented x3, cooperative. Slightly slow speech Eyes: extraocular movements intact, anicteric, conjunctiva pink Mouth: oral pharynx moist, no lesions Neck: supple no lymphadenopathy Cardiovascular: heart regular rate and rhythm, no murmur Lungs: clear to auscultation bilaterally Abdomen: soft, nontender, nondistended, no obvious ascites, no peritoneal signs, normal bowel sounds, no organomegaly. Prior surgical incisions well-healed Rectal: Omitted Extremities: no lower extremity edema bilaterally Skin: no lesions on visible extremities Neuro: No focal deficits. No asterixis.    ASSESSMENT:  #1. 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 Altered mental status. May be related to hepatic encephalopathy in part. Not convinced this explains all of her neuropsychiatric complaints #3. History of Crohn's colitis  #4. History of celiac sprue #5. History of peptic ulcer #6. Bipolar disorder #7. Multiple other general medical problems  PLAN:  #1. Comprehensive metabolic panel, PT/INR, venous ammonia today #2. Continue lactulose and Xifaxan #3. Office followup in 2-3 weeks.

## 2013-11-15 NOTE — Patient Instructions (Signed)
Your physician has requested that you go to the basement for lab work before leaving today  We will call you to schedule an appointment with Amy Esterwood in about 2 weeks

## 2013-11-16 ENCOUNTER — Telehealth: Payer: Self-pay | Admitting: Internal Medicine

## 2013-11-16 NOTE — Telephone Encounter (Signed)
Patient was instructed to make an f/u appointment with Mike Gip in 2 weeks because Dr. Marina Goodell is booked up.  Patient remembered she already has an appointment scheduled with Dr. Marina Goodell in the next 2 weeks.  We agreed she would keep that appointment so she could speak directly with Dr. Marina Goodell.

## 2013-11-17 ENCOUNTER — Telehealth: Payer: Self-pay

## 2013-11-17 MED ORDER — PREDNISONE 10 MG PO TABS: ORAL_TABLET | ORAL | Status: DC

## 2013-11-17 NOTE — Telephone Encounter (Signed)
Typed disability letter for patient's employer, per her request.

## 2013-11-17 NOTE — Telephone Encounter (Signed)
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 I want to place her on prednisone 30 mg daily. I discussed this with her today in the office (she accompanied her friend who saw me). Please call this in for her. Call her to confirm. She should stay on this until her next office appointment with me in a few weeks. Thanks. Dr. Marina Goodell ------

## 2013-11-17 NOTE — Telephone Encounter (Signed)
Pt aware and script sent to the pharmacy. 

## 2013-12-05 ENCOUNTER — Other Ambulatory Visit: Payer: Self-pay

## 2013-12-05 ENCOUNTER — Ambulatory Visit (INDEPENDENT_AMBULATORY_CARE_PROVIDER_SITE_OTHER): Payer: BC Managed Care – PPO | Admitting: Internal Medicine

## 2013-12-05 ENCOUNTER — Other Ambulatory Visit (INDEPENDENT_AMBULATORY_CARE_PROVIDER_SITE_OTHER): Payer: BC Managed Care – PPO

## 2013-12-05 ENCOUNTER — Encounter: Payer: Self-pay | Admitting: Internal Medicine

## 2013-12-05 VITALS — BP 100/62 | HR 64 | Ht 64.0 in | Wt 138.6 lb

## 2013-12-05 DIAGNOSIS — K7460 Unspecified cirrhosis of liver: Secondary | ICD-10-CM

## 2013-12-05 DIAGNOSIS — K900 Celiac disease: Secondary | ICD-10-CM

## 2013-12-05 DIAGNOSIS — K7682 Hepatic encephalopathy: Secondary | ICD-10-CM

## 2013-12-05 DIAGNOSIS — K7290 Hepatic failure, unspecified without coma: Secondary | ICD-10-CM

## 2013-12-05 DIAGNOSIS — K5090 Crohn's disease, unspecified, without complications: Secondary | ICD-10-CM

## 2013-12-05 LAB — CBC WITH DIFFERENTIAL/PLATELET
BASOS ABS: 0 10*3/uL (ref 0.0–0.1)
BASOS PCT: 0.4 % (ref 0.0–3.0)
EOS ABS: 0 10*3/uL (ref 0.0–0.7)
Eosinophils Relative: 0.4 % (ref 0.0–5.0)
HCT: 40.7 % (ref 36.0–46.0)
HEMOGLOBIN: 13.6 g/dL (ref 12.0–15.0)
LYMPHS ABS: 4.2 10*3/uL — AB (ref 0.7–4.0)
Lymphocytes Relative: 39.2 % (ref 12.0–46.0)
MCHC: 33.4 g/dL (ref 30.0–36.0)
MCV: 97 fl (ref 78.0–100.0)
MONO ABS: 0.9 10*3/uL (ref 0.1–1.0)
Monocytes Relative: 8 % (ref 3.0–12.0)
NEUTROS ABS: 5.6 10*3/uL (ref 1.4–7.7)
Neutrophils Relative %: 52 % (ref 43.0–77.0)
Platelets: 148 10*3/uL — ABNORMAL LOW (ref 150.0–400.0)
RBC: 4.19 Mil/uL (ref 3.87–5.11)
RDW: 20.4 % — AB (ref 11.5–15.5)
WBC: 10.8 10*3/uL — ABNORMAL HIGH (ref 4.0–10.5)

## 2013-12-05 LAB — COMPREHENSIVE METABOLIC PANEL
ALK PHOS: 90 U/L (ref 39–117)
ALT: 41 U/L — ABNORMAL HIGH (ref 0–35)
AST: 56 U/L — AB (ref 0–37)
Albumin: 2.6 g/dL — ABNORMAL LOW (ref 3.5–5.2)
BUN: 12 mg/dL (ref 6–23)
CO2: 28 mEq/L (ref 19–32)
CREATININE: 0.7 mg/dL (ref 0.4–1.2)
Calcium: 8.6 mg/dL (ref 8.4–10.5)
Chloride: 98 mEq/L (ref 96–112)
GFR: 85.25 mL/min (ref >60.00)
Glucose, Bld: 500 mg/dL (ref 70–99)
POTASSIUM: 4.9 meq/L (ref 3.5–5.1)
Sodium: 128 mEq/L — ABNORMAL LOW (ref 135–145)
Total Bilirubin: 2.3 mg/dL — ABNORMAL HIGH (ref 0.2–1.2)
Total Protein: 7.7 g/dL (ref 6.0–8.3)

## 2013-12-05 LAB — PROTIME-INR
INR: 2 ratio — ABNORMAL HIGH (ref 0.8–1.0)
Prothrombin Time: 21.5 s — ABNORMAL HIGH (ref 9.6–13.1)

## 2013-12-05 NOTE — Patient Instructions (Signed)
Your physician has requested that you go to the basement for the following lab work before leaving today:  CBC, CMET, PT-INR.  You have a follow up appointment with Dr. Marina Goodell on 12/22/2013 at 2:00pm.

## 2013-12-05 NOTE — Progress Notes (Signed)
HISTORY OF PRESENT ILLNESS:  Sabrina [name] is a 58 y.o. female with multiple significant medical problems who was initially evaluated in this office for mild Crohn's colitis and celiac disease. Subsequent problems with peptic ulcer disease. 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 She has not done well since. Initially, problems with ascites. Subsequently evidence for encephalopathy. Blood work suggested autoimmune disease as a potential cause for her liver disease. NASH was another consideration. Last seen 11/15/2013. See that dictation. Because of mild but persistent liver test abnormalities and prolongation of prothrombin time, I began her on prednisone 30 mg daily. Followup at this time. Continuing on Xifaxan and lactulose. Blood sugar control has been poor with increased thirst and weight loss. She did not inform her healthcare providers of these issues. She is accompanied by her friend. No new problems. Appears bit depressed  REVIEW OF SYSTEMS:  All non-GI ROS negative except for blurred vision, increased thirst, increased urination  Past Medical History  Diagnosis Date  . Arthritis   . Allergic rhinitis   . Colitis   . Degenerative disc disease   . Hyperlipidemia   . Hypertension   . Celiac disease   . Bipolar disorder   . Macular degeneration   . Interstitial cystitis   . Heart murmur   . Anxiety   . Depression   . Liver disease   . Gastric ulcer   . TIA (transient ischemic attack) 06/2013; ?10/13/2013  . Type II diabetes mellitus   . Anemia, iron deficiency 10/30/2012  . History of blood transfusion     "maybe 3-4; related to low counts" (10/13/2013)  . Hepatic encephalopathy   . Crohn's disease   . Cirrhosis     Past Surgical History  Procedure Laterality Date  . Appendectomy    . Lumbar laminectomy    . Carpal tunnel release Bilateral   . Cesarean section  1983  . Cysto/ hod/ instillatio clorpactin   07-03-2008  &  06-09-2001    INTERSTITIAL CYSTITIS  . Left shoulder /   including capsulectomy with debridement/  sad with resection of ac Left 01-25-2008  . Right shoulder arthroscopy/ open resection distal clavicle/ debridement adhesive capsulitis/ rotator cuff repair Right 08-30-2003  . Tonsillectomy and adenoidectomy    . Transthoracic echocardiogram  03-28-2011    NORMAL LVSF/ EF 60-65%/ VERY MILD AORTIC STENOSIS, TRIVIAL REGURG./ MILDLY DILATED LEFT ATRIUM  . Cystoscopy  11/26/2011    Procedure: CYSTOSCOPY;  Surgeon: Valetta Fuller, MD;  Location: [facility];  Service: Urology;  Laterality: N/A;  clorpactin  in bladder  . 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 Cholecystectomy    . Tubal ligation  1983  . Back surgery      Social History Sabrina [name]  reports that she has never smoked. She has never used smokeless tobacco. She reports that she does not drink alcohol or use illicit drugs.  family history includes Diabetes in her brother and mother; Hyperlipidemia in her brother; Hypertension in her brother, mother, and another family member. There is no history of Colon cancer.  Allergies  Allergen Reactions  . Sulfonamide Derivatives     Classic angioedema reaction  . Wheat Bran     Celiac's disease       PHYSICAL EXAMINATION: Vital signs: BP 100/62  Pulse 64  Ht 5\' 4"  (1.626 m)  Wt 138 lb 9.6 oz (62.869 kg)  BMI 23.78 kg/m2 ;9 pound weight loss Constitutional: Thin, chronically ill-appearing, no acute distress Psychiatric: alert and oriented x3, cooperative Eyes: extraocular movements intact, anicteric, conjunctiva pink Mouth: oral pharynx moist, no lesions Neck: supple no lymphadenopathy Cardiovascular: heart regular rate and rhythm, no murmur Lungs: clear to auscultation bilaterally Abdomen: soft, nontender,  nondistended, no obvious ascites, no peritoneal signs, normal bowel sounds, no organomegaly. Prior surgical incision well-healed Extremities: no lower extremity edema bilaterally Skin: no lesions on visible extremities Neuro: No focal deficits. No asterixis.    ASSESSMENT:  #1. 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 #2. History of ascites #3. History of hepatic encephalopathy #4. History of Crohn's colitis #5. History of celiac disease #6. Multiple medical problems. Worsening of diabetes on prednisone. Not on diabetic medications currently.    PLAN:  #1. See PCP ASAP regarding diabetes. May need to get back on diabetic medication #2. Repeat labs today including comprehensive metabolic panel, CBC, PT/INR #3. Continue prednisone 30 mg daily. May need to adjust. Also, continue Xifaxan and lactulose #4. Introduce the idea of tertiary care liver evaluation. Patient is checking her insurance. #5. Office visit in 2 weeks

## 2013-12-06 ENCOUNTER — Encounter: Payer: Self-pay | Admitting: Family Medicine

## 2013-12-09 ENCOUNTER — Ambulatory Visit (INDEPENDENT_AMBULATORY_CARE_PROVIDER_SITE_OTHER): Payer: BC Managed Care – PPO | Admitting: Family Medicine

## 2013-12-09 ENCOUNTER — Ambulatory Visit: Payer: BC Managed Care – PPO | Admitting: Family Medicine

## 2013-12-09 ENCOUNTER — Encounter: Payer: Self-pay | Admitting: Family Medicine

## 2013-12-09 VITALS — BP 102/68 | HR 73 | Temp 98.4°F | Resp 16 | Wt 142.1 lb

## 2013-12-09 DIAGNOSIS — E119 Type 2 diabetes mellitus without complications: Secondary | ICD-10-CM

## 2013-12-09 LAB — BASIC METABOLIC PANEL
BUN: 8 mg/dL (ref 6–23)
CO2: 25 meq/L (ref 19–32)
CREATININE: 0.7 mg/dL (ref 0.4–1.2)
Calcium: 9 mg/dL (ref 8.4–10.5)
Chloride: 96 mEq/L (ref 96–112)
GFR: 95.6 mL/min (ref >60.00)
Glucose, Bld: 397 mg/dL — ABNORMAL HIGH (ref 70–99)
Potassium: 4.6 mEq/L (ref 3.5–5.1)
Sodium: 128 mEq/L — ABNORMAL LOW (ref 135–145)

## 2013-12-09 LAB — HEMOGLOBIN A1C: Hgb A1c MFr Bld: 8.8 % — ABNORMAL HIGH (ref 4.6–6.5)

## 2013-12-09 NOTE — Progress Notes (Signed)
Pre visit review using our clinic review tool, if applicable. No additional management support is needed unless otherwise documented below in the visit note. 

## 2013-12-09 NOTE — Patient Instructions (Signed)
Follow up in 4-6 weeks to recheck sugar Increase Metformin to 1000mg  twice daily (2 tabs twice daily) Try and limit your carb intake Continue to check sugars- 1/2 the time fasting, 1/2 the time 2 hrs after eating Call with any questions or concerns Hang in there!  We'll figure this out!

## 2013-12-09 NOTE — Progress Notes (Signed)
   Subjective:    Patient ID: Sabrina [name], female    DOB: 03-14-1956, 58 y.o.   MRN: [id number]  HPI Diabetes- pt reports CBGs are 'very high'.  Increased thirst, increased urination, blurry vision.  Pt was started on Prednisone by GI for possible autoimmune liver disease.  Recent CBG was 502.  'won't go below 397'.  Currently on Metformin 500 bid.  No CP, SOB, HAs, abd pain, N/V/D.  No longer on Prednisone.  A1C in Jan 5.5   Review of Systems For ROS see HPI     Objective:   Physical Exam  Vitals reviewed. Constitutional: She is oriented to person, place, and time. She appears well-developed and well-nourished. No distress.  HENT:  Head: Normocephalic and atraumatic.  Eyes: Conjunctivae and EOM are normal. Pupils are equal, round, and reactive to light.  Neck: Normal range of motion. Neck supple. No thyromegaly present.  Cardiovascular: Normal rate, regular rhythm, normal heart sounds and intact distal pulses.   No murmur heard. Pulmonary/Chest: Effort normal and breath sounds normal. No respiratory distress.  Abdominal: Soft. She exhibits no distension. There is no tenderness.  Musculoskeletal: She exhibits no edema.  Lymphadenopathy:    She has no cervical adenopathy.  Neurological: She is alert and oriented to person, place, and time.  Skin: Skin is warm and dry.  Psychiatric: She has a normal mood and affect. Her behavior is normal.          Assessment & Plan:

## 2013-12-09 NOTE — Assessment & Plan Note (Signed)
Chronic problem.  Deteriorated.  Pt's A1C in Jan was normal at 5.5  Now she reports she can't get her sugars under upper 300s.  Restarted Metformin a few days ago after sending MyChart message.  Will titrate to 1000mg  BID until we get A1C to determine next steps.  Will follow.

## 2013-12-10 ENCOUNTER — Other Ambulatory Visit: Payer: Self-pay | Admitting: Family Medicine

## 2013-12-10 MED ORDER — SITAGLIP PHOS-METFORMIN HCL ER 100-1000 MG PO TB24: 1.0000 | ORAL_TABLET | Freq: Every day | ORAL | Status: DC

## 2013-12-12 ENCOUNTER — Telehealth: Payer: Self-pay | Admitting: Internal Medicine

## 2013-12-12 ENCOUNTER — Other Ambulatory Visit: Payer: Self-pay | Admitting: General Practice

## 2013-12-12 NOTE — Telephone Encounter (Signed)
Increase lactulose to achieve 3-5 bowel movements daily

## 2013-12-12 NOTE — Telephone Encounter (Signed)
Pt states she was confused this morning. Pt states she has not had a bowel movement in 2 days. She is only taking Lactulose daily. Pt states she is taking xifaxan also. Please advise.

## 2013-12-12 NOTE — Telephone Encounter (Signed)
Spoke with pt and she is aware.

## 2013-12-20 ENCOUNTER — Ambulatory Visit: Payer: Self-pay | Admitting: Nurse Practitioner

## 2013-12-21 ENCOUNTER — Encounter: Payer: Self-pay | Admitting: Family Medicine

## 2013-12-22 ENCOUNTER — Ambulatory Visit (INDEPENDENT_AMBULATORY_CARE_PROVIDER_SITE_OTHER): Payer: BC Managed Care – PPO | Admitting: Internal Medicine

## 2013-12-22 ENCOUNTER — Encounter: Payer: Self-pay | Admitting: Internal Medicine

## 2013-12-22 VITALS — BP 120/74 | HR 80 | Ht 64.0 in | Wt 144.4 lb

## 2013-12-22 DIAGNOSIS — K5090 Crohn's disease, unspecified, without complications: Secondary | ICD-10-CM

## 2013-12-22 DIAGNOSIS — K7460 Unspecified cirrhosis of liver: Secondary | ICD-10-CM

## 2013-12-22 DIAGNOSIS — K900 Celiac disease: Secondary | ICD-10-CM

## 2013-12-22 DIAGNOSIS — K7290 Hepatic failure, unspecified without coma: Secondary | ICD-10-CM

## 2013-12-22 DIAGNOSIS — K7682 Hepatic encephalopathy: Secondary | ICD-10-CM

## 2013-12-22 MED ORDER — LACTULOSE 10 G PO PACK: PACK | ORAL | Status: DC

## 2013-12-22 NOTE — Progress Notes (Signed)
HISTORY OF PRESENT ILLNESS:  Sabrina [name] is a 58 y.o. female with a very significant past medical history as outlined above. Has been followed here for Crohn's colitis, celiac disease, and most recently cryptogenic cirrhosis likely secondary to autoimmune disease or NASH. Last seen in the office 2 weeks ago. At that time and diabetes out of control after being placed on prednisone. Has since see her PCP and is now on diabetic medication and off prednisone. Has gained back her weight. Overall has done better. Bowels are fluctuating, but generally 3 a 5 movements per day with lactulose. She continues on Xifaxan. Her per your week until today. Today she feels weak. Notes floating in her abdomen. No bleeding. No fevers. She is accompanied by her friend. She did investigate possible liver transplant center as with her insurance. [facility] is preferred network  REVIEW OF SYSTEMS:  All non-GI ROS negative except for fatigue  Past Medical History  Diagnosis Date  . Arthritis   . Allergic rhinitis   . Colitis   . Degenerative disc disease   . Hyperlipidemia   . Hypertension   . Celiac disease   . Bipolar disorder   . Macular degeneration   . Interstitial cystitis   . Heart murmur   . Anxiety   . Depression   . Liver disease   . Gastric ulcer   . TIA (transient ischemic attack) 06/2013; ?10/13/2013  . Type II diabetes mellitus   . Anemia, iron deficiency 10/30/2012  . History of blood transfusion     "maybe 3-4; related to low counts" (10/13/2013)  . Hepatic encephalopathy   . Crohn's disease   . Cirrhosis     Past Surgical History  Procedure Laterality Date  . Appendectomy    . Lumbar laminectomy    . Carpal tunnel release Bilateral   . Cesarean section  1983  . Cysto/ hod/ instillatio clorpactin  07-03-2008  &  06-09-2001    INTERSTITIAL CYSTITIS  . Left shoulder /   including capsulectomy with debridement/  sad with resection of ac Left 01-25-2008  . Right shoulder arthroscopy/ open  resection distal clavicle/ debridement adhesive capsulitis/ rotator cuff repair Right 08-30-2003  . Tonsillectomy and adenoidectomy    . Transthoracic echocardiogram  03-28-2011    NORMAL LVSF/ EF 60-65%/ VERY MILD AORTIC STENOSIS, TRIVIAL REGURG./ MILDLY DILATED LEFT ATRIUM  . Cystoscopy  11/26/2011    Procedure: CYSTOSCOPY;  Surgeon: Valetta Fuller, MD;  Location: [facility];  Service: Urology;  Laterality: N/A;  clorpactin  in bladder  . 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 Cholecystectomy    . Tubal ligation  1983  . Back surgery      Social History Sabrina [name]  reports that she has never smoked. She has never used smokeless tobacco. She reports that she does not drink alcohol or use illicit drugs.  family history includes Diabetes in her brother and mother; Hyperlipidemia in her brother; Hypertension in her brother, mother, and another family member. There is no history of Colon cancer.  Allergies  Allergen Reactions  . Sulfonamide Derivatives     Classic angioedema reaction  . Wheat Bran     Celiac's disease       PHYSICAL EXAMINATION: Vital signs: BP 120/74  Pulse 80  Ht 5\' 4"  (1.626 m)  Wt 144 lb 6.4 oz (65.499  kg)  BMI 24.77 kg/m2  Constitutional: Thin and chronically ill-appearing, no acute distress Psychiatric: alert and oriented x3, cooperative Eyes: extraocular movements intact, anicteric, conjunctiva pink Mouth: oral pharynx moist, no lesions Neck: supple no lymphadenopathy Cardiovascular: heart regular rate and rhythm, no murmur Lungs: clear to auscultation bilaterally Abdomen: soft, nontender, very slightly nondistended, no obvious ascites, no peritoneal signs, normal bowel sounds, no organomegaly Extremities: no lower extremity edema bilaterally Skin: Multiple spider telangiectasia on the chest and  back Neuro: No focal deficits. No asterixis.    ASSESSMENT:  #1. Cryptogenic cirrhosis. Stable #2. History of ascites. Now with complaints of abdominal bloating. Watch closely for the development of ascites. She will monitor weights and abdominal girth. She will contact us if there's any progression. #3. History of hepatic encephalopathy. No evidence of such on current medical regimen #4. History of Crohn's colitis #5. History of celiac disease #6. Multiple medical problems. Diabetes improved since last visit   PLAN:  #1. Continue furosemide #2. Continue lactulose and Xifaxan #3. Continue omeprazole #4. Continue mesalamine #5. [facility] regarding further assessment of and help with management of her liver disease. Additionally, the possible need for liver transplantation. #6. The patient is physically unable to attend to any work duties or drive. A letter has been provided to her employer regarding her inability to work until further notice. She acknowledges  My orders not to drive until further notice. #7. Office followup in 4 weeks

## 2013-12-22 NOTE — Patient Instructions (Signed)
We have sent the following medications to your pharmacy for you to pick up at your convenience:  Lactulose - dosage has been doubled.   Please follow up with Dr. Marina GoodellPerry on 02/02/2014 at 10:45am

## 2013-12-23 ENCOUNTER — Other Ambulatory Visit: Payer: Self-pay

## 2013-12-23 MED ORDER — LACTULOSE 10 G PO PACK: PACK | ORAL | Status: DC

## 2013-12-30 ENCOUNTER — Encounter (HOSPITAL_COMMUNITY): Payer: Self-pay | Admitting: Emergency Medicine

## 2013-12-30 ENCOUNTER — Emergency Department (HOSPITAL_COMMUNITY): Payer: BC Managed Care – PPO

## 2013-12-30 ENCOUNTER — Inpatient Hospital Stay (HOSPITAL_COMMUNITY)
Admission: EM | Admit: 2013-12-30 | Discharge: 2013-12-31 | DRG: 378 | Disposition: A | Discharge status: Home / Self Care | Payer: BC Managed Care – PPO | Attending: Internal Medicine | Admitting: Internal Medicine

## 2013-12-30 DIAGNOSIS — D62 Acute posthemorrhagic anemia: Secondary | ICD-10-CM | POA: Diagnosis present

## 2013-12-30 DIAGNOSIS — Z91018 Allergy to other foods: Secondary | ICD-10-CM

## 2013-12-30 DIAGNOSIS — I350 Nonrheumatic aortic (valve) stenosis: Secondary | ICD-10-CM

## 2013-12-30 DIAGNOSIS — I10 Essential (primary) hypertension: Secondary | ICD-10-CM

## 2013-12-30 DIAGNOSIS — R7989 Other specified abnormal findings of blood chemistry: Secondary | ICD-10-CM

## 2013-12-30 DIAGNOSIS — N3010 Interstitial cystitis (chronic) without hematuria: Secondary | ICD-10-CM

## 2013-12-30 DIAGNOSIS — Z79899 Other long term (current) drug therapy: Secondary | ICD-10-CM

## 2013-12-30 DIAGNOSIS — R933 Abnormal findings on diagnostic imaging of other parts of digestive tract: Secondary | ICD-10-CM

## 2013-12-30 DIAGNOSIS — K7460 Unspecified cirrhosis of liver: Secondary | ICD-10-CM | POA: Diagnosis present

## 2013-12-30 DIAGNOSIS — E785 Hyperlipidemia, unspecified: Secondary | ICD-10-CM | POA: Diagnosis present

## 2013-12-30 DIAGNOSIS — K7290 Hepatic failure, unspecified without coma: Secondary | ICD-10-CM

## 2013-12-30 DIAGNOSIS — Z9851 Tubal ligation status: Secondary | ICD-10-CM

## 2013-12-30 DIAGNOSIS — K900 Celiac disease: Secondary | ICD-10-CM

## 2013-12-30 DIAGNOSIS — K254 Chronic or unspecified gastric ulcer with hemorrhage: Principal | ICD-10-CM | POA: Diagnosis present

## 2013-12-30 DIAGNOSIS — R188 Other ascites: Secondary | ICD-10-CM

## 2013-12-30 DIAGNOSIS — Z9089 Acquired absence of other organs: Secondary | ICD-10-CM

## 2013-12-30 DIAGNOSIS — K50111 Crohn's disease of large intestine with rectal bleeding: Secondary | ICD-10-CM

## 2013-12-30 DIAGNOSIS — K922 Gastrointestinal hemorrhage, unspecified: Secondary | ICD-10-CM

## 2013-12-30 DIAGNOSIS — R945 Abnormal results of liver function studies: Secondary | ICD-10-CM

## 2013-12-30 DIAGNOSIS — D689 Coagulation defect, unspecified: Secondary | ICD-10-CM

## 2013-12-30 DIAGNOSIS — Z9081 Acquired absence of spleen: Secondary | ICD-10-CM

## 2013-12-30 DIAGNOSIS — K5731 Diverticulosis of large intestine without perforation or abscess with bleeding: Secondary | ICD-10-CM | POA: Diagnosis present

## 2013-12-30 DIAGNOSIS — K7469 Other cirrhosis of liver: Secondary | ICD-10-CM

## 2013-12-30 DIAGNOSIS — K7682 Hepatic encephalopathy: Secondary | ICD-10-CM

## 2013-12-30 DIAGNOSIS — Z882 Allergy status to sulfonamides status: Secondary | ICD-10-CM

## 2013-12-30 DIAGNOSIS — Z8673 Personal history of transient ischemic attack (TIA), and cerebral infarction without residual deficits: Secondary | ICD-10-CM

## 2013-12-30 DIAGNOSIS — K559 Vascular disorder of intestine, unspecified: Secondary | ICD-10-CM | POA: Diagnosis present

## 2013-12-30 DIAGNOSIS — Z8249 Family history of ischemic heart disease and other diseases of the circulatory system: Secondary | ICD-10-CM

## 2013-12-30 DIAGNOSIS — Z833 Family history of diabetes mellitus: Secondary | ICD-10-CM

## 2013-12-30 DIAGNOSIS — D696 Thrombocytopenia, unspecified: Secondary | ICD-10-CM | POA: Diagnosis present

## 2013-12-30 DIAGNOSIS — F411 Generalized anxiety disorder: Secondary | ICD-10-CM | POA: Diagnosis present

## 2013-12-30 DIAGNOSIS — I359 Nonrheumatic aortic valve disorder, unspecified: Secondary | ICD-10-CM | POA: Diagnosis present

## 2013-12-30 DIAGNOSIS — F319 Bipolar disorder, unspecified: Secondary | ICD-10-CM | POA: Diagnosis present

## 2013-12-30 DIAGNOSIS — K625 Hemorrhage of anus and rectum: Secondary | ICD-10-CM

## 2013-12-30 DIAGNOSIS — K5010 Crohn's disease of large intestine without complications: Secondary | ICD-10-CM | POA: Diagnosis present

## 2013-12-30 DIAGNOSIS — H3530 Unspecified macular degeneration: Secondary | ICD-10-CM | POA: Diagnosis present

## 2013-12-30 DIAGNOSIS — E119 Type 2 diabetes mellitus without complications: Secondary | ICD-10-CM

## 2013-12-30 LAB — CBC
HCT: 35.5 % — ABNORMAL LOW (ref 36.0–46.0)
HEMOGLOBIN: 12.7 g/dL (ref 12.0–15.0)
MCH: 33.7 pg (ref 26.0–34.0)
MCHC: 35.8 g/dL (ref 30.0–36.0)
MCV: 94.2 fL (ref 78.0–100.0)
Platelets: 230 10*3/uL (ref 150–400)
RBC: 3.77 MIL/uL — ABNORMAL LOW (ref 3.87–5.11)
RDW: 21.4 % — ABNORMAL HIGH (ref 11.5–15.5)
WBC: 15.6 10*3/uL — ABNORMAL HIGH (ref 4.0–10.5)

## 2013-12-30 LAB — URINALYSIS, ROUTINE W REFLEX MICROSCOPIC
GLUCOSE, UA: NEGATIVE mg/dL
Hgb urine dipstick: NEGATIVE
Ketones, ur: NEGATIVE mg/dL
LEUKOCYTES UA: NEGATIVE
Nitrite: POSITIVE — AB
PH: 5.5 (ref 5.0–8.0)
Protein, ur: NEGATIVE mg/dL
Specific Gravity, Urine: 1.021 (ref 1.005–1.030)
Urobilinogen, UA: 1 mg/dL (ref 0.0–1.0)

## 2013-12-30 LAB — ABO/RH: ABO/RH(D): A POS

## 2013-12-30 LAB — COMPREHENSIVE METABOLIC PANEL
ALT: 34 U/L (ref 0–35)
AST: 80 U/L — AB (ref 0–37)
Albumin: 2.6 g/dL — ABNORMAL LOW (ref 3.5–5.2)
Alkaline Phosphatase: 123 U/L — ABNORMAL HIGH (ref 39–117)
Anion gap: 12 (ref 5–15)
BUN: 7 mg/dL (ref 6–23)
CO2: 21 meq/L (ref 19–32)
Calcium: 9 mg/dL (ref 8.4–10.5)
Chloride: 101 mEq/L (ref 96–112)
Creatinine, Ser: 0.46 mg/dL — ABNORMAL LOW (ref 0.50–1.10)
GFR calc Af Amer: >90 mL/min (ref >90)
Glucose, Bld: 181 mg/dL — ABNORMAL HIGH (ref 70–99)
Potassium: 4.3 mEq/L (ref 3.7–5.3)
SODIUM: 134 meq/L — AB (ref 137–147)
Total Bilirubin: 2.4 mg/dL — ABNORMAL HIGH (ref 0.3–1.2)
Total Protein: 8.7 g/dL — ABNORMAL HIGH (ref 6.0–8.3)

## 2013-12-30 LAB — CBC WITH DIFFERENTIAL/PLATELET
Basophils Absolute: 0.1 10*3/uL (ref 0.0–0.1)
Basophils Relative: 1 % (ref 0–1)
EOS PCT: 1 % (ref 0–5)
Eosinophils Absolute: 0.1 10*3/uL (ref 0.0–0.7)
HCT: 35.2 % — ABNORMAL LOW (ref 36.0–46.0)
Hemoglobin: 12.7 g/dL (ref 12.0–15.0)
LYMPHS PCT: 28 % (ref 12–46)
Lymphs Abs: 3.4 10*3/uL (ref 0.7–4.0)
MCH: 34 pg (ref 26.0–34.0)
MCHC: 36.1 g/dL — ABNORMAL HIGH (ref 30.0–36.0)
MCV: 94.4 fL (ref 78.0–100.0)
Monocytes Absolute: 1.5 10*3/uL — ABNORMAL HIGH (ref 0.1–1.0)
Monocytes Relative: 12 % (ref 3–12)
NEUTROS PCT: 58 % (ref 43–77)
Neutro Abs: 7.2 10*3/uL (ref 1.7–7.7)
Platelets: 241 10*3/uL (ref 150–400)
RBC: 3.73 MIL/uL — ABNORMAL LOW (ref 3.87–5.11)
RDW: 21.1 % — ABNORMAL HIGH (ref 11.5–15.5)
WBC: 12.3 10*3/uL — ABNORMAL HIGH (ref 4.0–10.5)

## 2013-12-30 LAB — TYPE AND SCREEN
ABO/RH(D): A POS
ANTIBODY SCREEN: NEGATIVE

## 2013-12-30 LAB — GLUCOSE, CAPILLARY: Glucose-Capillary: 84 mg/dL (ref 70–99)

## 2013-12-30 LAB — PROTIME-INR
INR: 1.87 — ABNORMAL HIGH (ref 0.00–1.49)
Prothrombin Time: 21.5 seconds — ABNORMAL HIGH (ref 11.6–15.2)

## 2013-12-30 LAB — I-STAT CG4 LACTIC ACID, ED: LACTIC ACID, VENOUS: 3.52 mmol/L — AB (ref 0.5–2.2)

## 2013-12-30 LAB — CBG MONITORING, ED: Glucose-Capillary: 128 mg/dL — ABNORMAL HIGH (ref 70–99)

## 2013-12-30 LAB — URINE MICROSCOPIC-ADD ON

## 2013-12-30 LAB — LIPASE, BLOOD: Lipase: 56 U/L (ref 11–59)

## 2013-12-30 LAB — POC OCCULT BLOOD, ED: FECAL OCCULT BLD: POSITIVE — AB

## 2013-12-30 MED ORDER — LORAZEPAM 1 MG PO TABS
0.5000 mg | ORAL_TABLET | Freq: Two times a day (BID) | ORAL | Status: DC
Start: 2013-12-30 — End: 2013-12-31
  Administered 2013-12-30 – 2013-12-31 (×2): 0.5 mg via ORAL
  Filled 2013-12-30 (×2): qty 1

## 2013-12-30 MED ORDER — SODIUM CHLORIDE 0.9 % IV BOLUS (SEPSIS)
1000.0000 mL | Freq: Once | INTRAVENOUS | Status: AC
  Administered 2013-12-30: 1000 mL via INTRAVENOUS

## 2013-12-30 MED ORDER — PANTOPRAZOLE SODIUM 40 MG IV SOLR
40.0000 mg | Freq: Two times a day (BID) | INTRAVENOUS | Status: DC
  Administered 2013-12-30: 40 mg via INTRAVENOUS
  Filled 2013-12-30 (×3): qty 40

## 2013-12-30 MED ORDER — SODIUM CHLORIDE 0.9 % IJ SOLN
3.0000 mL | Freq: Two times a day (BID) | INTRAMUSCULAR | Status: DC
  Administered 2013-12-30 – 2013-12-31 (×2): 3 mL via INTRAVENOUS

## 2013-12-30 MED ORDER — FLUOXETINE HCL 20 MG PO TABS
40.0000 mg | ORAL_TABLET | Freq: Every morning | ORAL | Status: DC
Start: 2013-12-31 — End: 2013-12-31
  Administered 2013-12-31: 40 mg via ORAL
  Filled 2013-12-30: qty 2

## 2013-12-30 MED ORDER — RIFAXIMIN 550 MG PO TABS
550.0000 mg | ORAL_TABLET | Freq: Two times a day (BID) | ORAL | Status: DC
  Administered 2013-12-30 – 2013-12-31 (×2): 550 mg via ORAL
  Filled 2013-12-30 (×3): qty 1

## 2013-12-30 MED ORDER — ONDANSETRON HCL 4 MG PO TABS: 4.0000 mg | ORAL_TABLET | Freq: Four times a day (QID) | ORAL | Status: DC | PRN

## 2013-12-30 MED ORDER — MESALAMINE 1.2 G PO TBEC
1.2000 g | DELAYED_RELEASE_TABLET | Freq: Two times a day (BID) | ORAL | Status: DC
  Administered 2013-12-30 – 2013-12-31 (×2): 1.2 g via ORAL
  Filled 2013-12-30 (×3): qty 1

## 2013-12-30 MED ORDER — CEFTRIAXONE SODIUM 1 G IJ SOLR
1.0000 g | INTRAMUSCULAR | Status: DC
  Administered 2013-12-31: 1 g via INTRAVENOUS
  Filled 2013-12-30 (×2): qty 10

## 2013-12-30 MED ORDER — ONDANSETRON HCL 4 MG/2ML IJ SOLN: 4.0000 mg | Freq: Four times a day (QID) | INTRAMUSCULAR | Status: DC | PRN

## 2013-12-30 MED ORDER — IOHEXOL 350 MG/ML SOLN
100.0000 mL | Freq: Once | INTRAVENOUS | Status: AC | PRN
  Administered 2013-12-30: 100 mL via INTRAVENOUS

## 2013-12-30 MED ORDER — PANTOPRAZOLE SODIUM 40 MG IV SOLR
40.0000 mg | Freq: Once | INTRAVENOUS | Status: AC
  Administered 2013-12-30: 40 mg via INTRAVENOUS
  Filled 2013-12-30: qty 40

## 2013-12-30 MED ORDER — LACTULOSE 10 GM/15ML PO SOLN
10.0000 g | Freq: Every day | ORAL | Status: DC
  Administered 2013-12-31: 10 g via ORAL
  Filled 2013-12-30: qty 15

## 2013-12-30 MED ORDER — INSULIN ASPART 100 UNIT/ML ~~LOC~~ SOLN
0.0000 [IU] | Freq: Three times a day (TID) | SUBCUTANEOUS | Status: DC
  Administered 2013-12-31: 2 [IU] via SUBCUTANEOUS

## 2013-12-30 MED ORDER — PENTOSAN POLYSULFATE SODIUM 100 MG PO CAPS
200.0000 mg | ORAL_CAPSULE | Freq: Two times a day (BID) | ORAL | Status: DC
  Administered 2013-12-30 – 2013-12-31 (×2): 200 mg via ORAL
  Filled 2013-12-30 (×3): qty 2

## 2013-12-30 MED ORDER — TRAZODONE HCL 50 MG PO TABS
50.0000 mg | ORAL_TABLET | Freq: Every day | ORAL | Status: DC
  Administered 2013-12-30: 50 mg via ORAL
  Filled 2013-12-30 (×2): qty 1

## 2013-12-30 MED ORDER — INSULIN ASPART 100 UNIT/ML ~~LOC~~ SOLN: 0.0000 [IU] | Freq: Every day | SUBCUTANEOUS | Status: DC

## 2013-12-30 MED ORDER — ONDANSETRON HCL 4 MG/2ML IJ SOLN
4.0000 mg | Freq: Once | INTRAMUSCULAR | Status: AC
  Administered 2013-12-30: 4 mg via INTRAVENOUS
  Filled 2013-12-30: qty 2

## 2013-12-30 NOTE — Progress Notes (Signed)
Attempted to call ED for report. No answer. Will retry in a little while.

## 2013-12-30 NOTE — ED Notes (Signed)
Per pt sts she had a BM this am anf was nothing but blood, sts nausea all week and abdominal pain and distention. sts also some back pain.

## 2013-12-30 NOTE — ED Notes (Signed)
Dr. Allena Katz, hospitalist, at the bedside, seeing patient.

## 2013-12-30 NOTE — ED Provider Notes (Addendum)
CSN: [id number]     Arrival date & time 12/30/13  1420 History   First MD Initiated Contact with Patient 12/30/13 1641     Chief Complaint  Patient presents with  . Rectal Bleeding     (Consider location/radiation/quality/duration/timing/severity/associated sxs/prior Treatment) HPI  58 year old female with a history of hepatic cirrhosis presents with one episode of a bloody bowel movement. She states normally she has multiple bowel movements a day as she is on lactulose. She had 2 normal bowel movements and then her third one today was just pure blood. Denies specific pain while she is having a bowel movement. She states she's been having some right and left upper abdominal pain over last few days. This is similar to the pain she had one week ago when she saw Dr. Marina Goodell. Has had persistent nausea during this time but no vomiting. She states her abdomen was much more swollen today prior to arrival but has gone back to normal size. She rates the pain as moderate at this time. Denies any vomiting or hematemesis. No urinary symptoms. Is not a blood thinners besides a baby aspirin. She is currently set up to go to [facility] in 3 days for a liver transplant evaluation.  Past Medical History  Diagnosis Date  . Arthritis   . Allergic rhinitis   . Colitis   . Degenerative disc disease   . Hyperlipidemia   . Hypertension   . Celiac disease   . Bipolar disorder   . Macular degeneration   . Interstitial cystitis   . Heart murmur   . Anxiety   . Depression   . Liver disease   . Gastric ulcer   . TIA (transient ischemic attack) 06/2013; ?10/13/2013  . Type II diabetes mellitus   . Anemia, iron deficiency 10/30/2012  . History of blood transfusion     "maybe 3-4; related to low counts" (10/13/2013)  . Hepatic encephalopathy   . Crohn's disease   . Cirrhosis    Past Surgical History  Procedure Laterality Date  . Appendectomy    . Lumbar laminectomy    . Carpal tunnel release Bilateral   . Cesarean  section  1983  . Cysto/ hod/ instillatio clorpactin  07-03-2008  &  06-09-2001    INTERSTITIAL CYSTITIS  . Left shoulder /   including capsulectomy with debridement/  sad with resection of ac Left 01-25-2008  . Right shoulder arthroscopy/ open resection distal clavicle/ debridement adhesive capsulitis/ rotator cuff repair Right 08-30-2003  . Tonsillectomy and adenoidectomy    . Transthoracic echocardiogram  03-28-2011    NORMAL LVSF/ EF 60-65%/ VERY MILD AORTIC STENOSIS, TRIVIAL REGURG./ MILDLY DILATED LEFT ATRIUM  . Cystoscopy  11/26/2011    Procedure: CYSTOSCOPY;  Surgeon: Valetta Fuller, MD;  [facility];  Service: Urology;  Laterality: N/A;  clorpactin  in bladder  . 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 Cholecystectomy    . Tubal ligation  1983  . Back surgery     Family History  Problem Relation Age of Onset  . Diabetes Mother   . Hypertension Mother   . Diabetes Brother   . Hypertension Brother   . Hyperlipidemia Brother   . Hypertension    . Colon cancer Neg Hx    History  Substance Use Topics  . Smoking status: Never Smoker   .  Smokeless tobacco: Never Used  . Alcohol Use: No   OB History   Grav Para Term Preterm Abortions TAB SAB Ect Mult Living                 Review of Systems  Constitutional: Negative for fever.  Cardiovascular: Negative for chest pain.  Gastrointestinal: Positive for nausea, abdominal pain and blood in stool. Negative for vomiting.  Genitourinary: Negative for dysuria.  Neurological: Positive for dizziness (                ) and light-headedness (for past 3 days).  All other systems reviewed and are negative.     Allergies  Sulfonamide derivatives and Wheat bran  Home Medications   Prior to Admission medications   Medication Sig Start Date End Date Taking? Authorizing  Provider  aspirin 81 MG tablet Take 1 tablet (81 mg total) by mouth daily. 07/23/13  Yes Clanford Cyndie Mull, MD  cetirizine (ZYRTEC) 10 MG tablet Take 10 mg by mouth daily.   Yes Historical Provider, MD  feeding supplement, ENSURE COMPLETE, (ENSURE COMPLETE) LIQD Take 237 mLs by mouth 2 (two) times daily between meals. 10/16/13  Yes Belkys A Regalado, MD  FLUoxetine (PROZAC) 40 MG capsule Take 40 mg by mouth every morning.    Yes Historical Provider, MD  furosemide (LASIX) 20 MG tablet Take 20 mg by mouth daily.   Yes Historical Provider, MD  iron polysaccharides (POLY-IRON 150) 150 MG capsule Take 1 capsule (150 mg total) by mouth 2 (two) times daily. 09/05/13  Yes Hilarie Fredrickson, MD  lactulose (CEPHULAC) 10 G packet Take 2 packets per day. 12/23/13  Yes Hilarie Fredrickson, MD  LORazepam (ATIVAN) 0.5 MG tablet Take 0.5 mg by mouth 2 (two) times daily.    Yes Historical Provider, MD  mesalamine (LIALDA) 1.2 G EC tablet Take 1.2 g by mouth 2 (two) times daily.   Yes Historical Provider, MD  Multiple Vitamins-Minerals (MULTIVITAMIN WITH MINERALS) tablet Take 1 tablet by mouth daily.   Yes Historical Provider, MD  omeprazole (PRILOSEC) 40 MG capsule Take 40 mg by mouth daily.   Yes Historical Provider, MD  pentosan polysulfate (ELMIRON) 100 MG capsule Take 200 mg by mouth 2 (two) times daily.    Yes Historical Provider, MD  Potassium Carbonate GRAN Take 1 tablet by mouth daily.   Yes Historical Provider, MD  rifaximin (XIFAXAN) 550 MG TABS tablet Take 1 tablet (550 mg total) by mouth 2 (two) times daily. 10/27/13  Yes Amy S Esterwood, PA-C  SitaGLIPtin-MetFORMIN HCl (JANUMET XR) [phone] MG TB24 Take 1 tablet by mouth daily. 12/10/13  Yes Sheliah Hatch, MD  traZODone (DESYREL) 50 MG tablet Take 50 mg by mouth at bedtime.   Yes Historical Provider, MD   BP 129/59  Pulse 80  Temp(Src) 98.4 F (36.9 C) (Oral)  Resp 13  Wt 149 lb 4 oz (67.699 kg)  SpO2 97% Physical Exam  Nursing note and vitals  reviewed. Constitutional: She is oriented to person, place, and time. She appears well-developed and well-nourished.  HENT:  Head: Normocephalic and atraumatic.  Right Ear: External ear normal.  Left Ear: External ear normal.  Nose: Nose normal.  Eyes: Right eye exhibits no discharge. Left eye exhibits no discharge.  Cardiovascular: Normal rate, regular rhythm and normal heart sounds.   Pulmonary/Chest: Effort normal and breath sounds normal.  Abdominal: Soft. There is tenderness in the epigastric area.  Genitourinary: Rectal exam shows no external hemorrhoid, no  internal hemorrhoid and no tenderness.  Mild bright red blood on finger after rectal exam  Neurological: She is alert and oriented to person, place, and time.  Skin: Skin is warm and dry.    ED Course  Procedures (including critical care time) Labs Review Labs Reviewed  CBC WITH DIFFERENTIAL - Abnormal; Notable for the following:    WBC 12.3 (*)    RBC 3.73 (*)    HCT 35.2 (*)    MCHC 36.1 (*)    RDW 21.1 (*)    Monocytes Absolute 1.5 (*)    All other components within normal limits  COMPREHENSIVE METABOLIC PANEL - Abnormal; Notable for the following:    Sodium 134 (*)    Glucose, Bld 181 (*)    Creatinine, Ser 0.46 (*)    Total Protein 8.7 (*)    Albumin 2.6 (*)    AST 80 (*)    Alkaline Phosphatase 123 (*)    Total Bilirubin 2.4 (*)    All other components within normal limits  URINALYSIS, ROUTINE W REFLEX MICROSCOPIC - Abnormal; Notable for the following:    Color, Urine AMBER (*)    Bilirubin Urine SMALL (*)    Nitrite POSITIVE (*)    All other components within normal limits  PROTIME-INR - Abnormal; Notable for the following:    Prothrombin Time 21.5 (*)    INR 1.87 (*)    All other components within normal limits  URINE MICROSCOPIC-ADD ON - Abnormal; Notable for the following:    Bacteria, UA MANY (*)    Casts HYALINE CASTS (*)    All other components within normal limits  CBC - Abnormal; Notable for  the following:    WBC 15.6 (*)    RBC 3.77 (*)    HCT 35.5 (*)    RDW 21.4 (*)    All other components within normal limits  POC OCCULT BLOOD, ED - Abnormal; Notable for the following:    Fecal Occult Bld POSITIVE (*)    All other components within normal limits  I-STAT CG4 LACTIC ACID, ED - Abnormal; Notable for the following:    Lactic Acid, Venous 3.52 (*)    All other components within normal limits  CBG MONITORING, ED - Abnormal; Notable for the following:    Glucose-Capillary 128 (*)    All other components within normal limits  LIPASE, BLOOD  GLUCOSE, CAPILLARY  CBC  CBC  CBC  POC OCCULT BLOOD, ED  TYPE AND SCREEN  ABO/RH    Imaging Review Ct Cta Abd/pel W/cm &/or W/o Cm  12/30/2013   CLINICAL DATA:  Upper abdominal pain. Abdominal distention. Rectal bleeding.  EXAM: CTA ABDOMEN AND PELVIS wITHOUT AND WITH CONTRAST  TECHNIQUE: Multidetector CT imaging of the abdomen and pelvis was performed using the standard protocol during bolus administration of intravenous contrast. Multiplanar reconstructed images and MIPs were obtained and reviewed to evaluate the vascular anatomy.  CONTRAST:  OMNIPAQUE IOHEXOL 350 MG/ML SOLN  COMPARISON:  03/24/2013  FINDINGS: Bibasilar atelectasis.  Heart is mildly enlarged.  No effusions.  Changes of severe cirrhosis with nodular liver, enlargement of the left hepatic lobe atrophy of the right hepatic lobe. Moderate to large volume ascites throughout the abdomen and pelvis.  Prior cholecystectomy. Prior splenectomy. Pancreas, adrenals and kidneys are unremarkable. Uterus, adnexae and urinary bladder grossly unremarkable.  Evaluation of the large and small bowel somewhat limited without intravenous contrast and with the ascites. No definite focal abnormality. Mildly prominent mesenteric lymph nodes centrally.  These are presumably related to congestion or reactive. Aorta is normal caliber. Incidentally noted is a retro aortic left renal vein.  Review of  the MIP images confirms the above findings.  IMPRESSION: Severe cirrhosis with moderate to large volume ascites.  Prior cholecystectomy and splenectomy.  Bibasilar atelectasis.   Electronically Signed   By: Charlett NoseKevin  Dover M.D.   On: 12/30/2013 20:24     EKG Interpretation None      MDM   Final diagnoses:  BRBPR (bright red blood per rectum)    Patient's abdominal exam is unremarkable, mild tenderness noted but nonspecific. Given her elevated lactate and BRBPR, CT angio evaluated, shows no ischemia but shows ascites. Given lack of significant pain or fever I have low suspicion for SBP. Has continued to have bloody stools here. Has stable vitals and normal hgb, but is higher risk with her cirrhosis. D/w her GI, Dr. Marina GoodellPerry, who will see in AM. Stable for admission to floor.    Audree CamelScott T Bralin Garry, MD 12/31/13 [id number]  Audree CamelScott T Ajit Errico, MD 12/31/13 [phone]

## 2013-12-30 NOTE — ED Notes (Signed)
Patient request medication for nausea, reported to Dr. Criss AlvineGoldston.

## 2013-12-30 NOTE — ED Notes (Signed)
i-stat CG4 result given to Dr. Criss AlvineGoldston

## 2013-12-31 DIAGNOSIS — K7460 Unspecified cirrhosis of liver: Secondary | ICD-10-CM

## 2013-12-31 DIAGNOSIS — K7682 Hepatic encephalopathy: Secondary | ICD-10-CM

## 2013-12-31 DIAGNOSIS — R933 Abnormal findings on diagnostic imaging of other parts of digestive tract: Secondary | ICD-10-CM

## 2013-12-31 DIAGNOSIS — K7290 Hepatic failure, unspecified without coma: Secondary | ICD-10-CM

## 2013-12-31 DIAGNOSIS — R7989 Other specified abnormal findings of blood chemistry: Secondary | ICD-10-CM

## 2013-12-31 DIAGNOSIS — K625 Hemorrhage of anus and rectum: Secondary | ICD-10-CM

## 2013-12-31 LAB — COMPREHENSIVE METABOLIC PANEL
ALT: 27 U/L (ref 0–35)
AST: 73 U/L — AB (ref 0–37)
Albumin: 2.1 g/dL — ABNORMAL LOW (ref 3.5–5.2)
Alkaline Phosphatase: 88 U/L (ref 39–117)
Anion gap: 10 (ref 5–15)
BILIRUBIN TOTAL: 1.9 mg/dL — AB (ref 0.3–1.2)
BUN: 5 mg/dL — ABNORMAL LOW (ref 6–23)
CHLORIDE: 106 meq/L (ref 96–112)
CO2: 20 meq/L (ref 19–32)
CREATININE: 0.47 mg/dL — AB (ref 0.50–1.10)
Calcium: 8.1 mg/dL — ABNORMAL LOW (ref 8.4–10.5)
GLUCOSE: 109 mg/dL — AB (ref 70–99)
Potassium: 4.2 mEq/L (ref 3.7–5.3)
Sodium: 136 mEq/L — ABNORMAL LOW (ref 137–147)
Total Protein: 7 g/dL (ref 6.0–8.3)

## 2013-12-31 LAB — GLUCOSE, CAPILLARY
GLUCOSE-CAPILLARY: 104 mg/dL — AB (ref 70–99)
Glucose-Capillary: 126 mg/dL — ABNORMAL HIGH (ref 70–99)

## 2013-12-31 LAB — CBC
HCT: 32.7 % — ABNORMAL LOW (ref 36.0–46.0)
HCT: 35.6 % — ABNORMAL LOW (ref 36.0–46.0)
Hemoglobin: 11.3 g/dL — ABNORMAL LOW (ref 12.0–15.0)
Hemoglobin: 12.4 g/dL (ref 12.0–15.0)
MCH: 33.5 pg (ref 26.0–34.0)
MCH: 33.2 pg (ref 26.0–34.0)
MCHC: 34.6 g/dL (ref 30.0–36.0)
MCHC: 34.8 g/dL (ref 30.0–36.0)
MCV: 97 fL (ref 78.0–100.0)
MCV: 95.4 fL (ref 78.0–100.0)
PLATELETS: 195 10*3/uL (ref 150–400)
PLATELETS: 228 10*3/uL (ref 150–400)
RBC: 3.37 MIL/uL — ABNORMAL LOW (ref 3.87–5.11)
RBC: 3.73 MIL/uL — ABNORMAL LOW (ref 3.87–5.11)
RDW: 21 % — ABNORMAL HIGH (ref 11.5–15.5)
RDW: 21.4 % — AB (ref 11.5–15.5)
WBC: 10.3 10*3/uL (ref 4.0–10.5)
WBC: 11.6 10*3/uL — ABNORMAL HIGH (ref 4.0–10.5)

## 2013-12-31 MED ORDER — SPIRONOLACTONE 100 MG PO TABS: 100.0000 mg | ORAL_TABLET | Freq: Every day | ORAL | Status: DC

## 2013-12-31 MED ORDER — FUROSEMIDE 20 MG PO TABS
20.0000 mg | ORAL_TABLET | Freq: Every day | ORAL | Status: DC
  Administered 2013-12-31: 20 mg via ORAL
  Filled 2013-12-31: qty 1

## 2013-12-31 MED ORDER — SITAGLIP PHOS-METFORMIN HCL ER 100-1000 MG PO TB24: 1.0000 | ORAL_TABLET | Freq: Every day | ORAL | Status: DC

## 2013-12-31 MED ORDER — PANTOPRAZOLE SODIUM 40 MG PO TBEC
40.0000 mg | DELAYED_RELEASE_TABLET | Freq: Every day | ORAL | Status: DC
  Administered 2013-12-31: 40 mg via ORAL
  Filled 2013-12-31: qty 1

## 2013-12-31 MED ORDER — SPIRONOLACTONE 100 MG PO TABS
100.0000 mg | ORAL_TABLET | Freq: Every day | ORAL | Status: DC
  Administered 2013-12-31: 100 mg via ORAL
  Filled 2013-12-31: qty 1

## 2013-12-31 NOTE — Progress Notes (Signed)
Sabrina [name] discharged Home per MD order.  Discharge instructions reviewed and discussed with the patient, all questions and concerns answered. Copy of instructions and scripts given to patient.    Medication List    STOP taking these medications       aspirin 81 MG tablet      TAKE these medications       cetirizine 10 MG tablet  Commonly known as:  ZYRTEC  Take 10 mg by mouth daily.     feeding supplement (ENSURE COMPLETE) Liqd  Take 237 mLs by mouth 2 (two) times daily between meals.     FLUoxetine 40 MG capsule  Commonly known as:  PROZAC  Take 40 mg by mouth every morning.     furosemide 20 MG tablet  Commonly known as:  LASIX  Take 20 mg by mouth daily.     iron polysaccharides 150 MG capsule  Commonly known as:  POLY-IRON 150  Take 1 capsule (150 mg total) by mouth 2 (two) times daily.     lactulose 10 G packet  Commonly known as:  CEPHULAC  Take 2 packets per day.     LORazepam 0.5 MG tablet  Commonly known as:  ATIVAN  Take 0.5 mg by mouth 2 (two) times daily.     mesalamine 1.2 G EC tablet  Commonly known as:  LIALDA  Take 1.2 g by mouth 2 (two) times daily.     multivitamin with minerals tablet  Take 1 tablet by mouth daily.     omeprazole 40 MG capsule  Commonly known as:  PRILOSEC  Take 40 mg by mouth daily.     pentosan polysulfate 100 MG capsule  Commonly known as:  ELMIRON  Take 200 mg by mouth 2 (two) times daily.     Potassium Carbonate Gran  Take 1 tablet by mouth daily.     rifaximin 550 MG Tabs tablet  Commonly known as:  XIFAXAN  Take 1 tablet (550 mg total) by mouth 2 (two) times daily.     SitaGLIPtin-MetFORMIN HCl [phone] MG Tb24  Commonly known as:  JANUMET XR  Take 1 tablet by mouth daily.  Start taking on:  01/01/2014     spironolactone 100 MG tablet  Commonly known as:  ALDACTONE  Take 1 tablet (100 mg total) by mouth daily.     traZODone 50 MG tablet  Commonly known as:  DESYREL  Take 50 mg by mouth at bedtime.         Patients skin is clean, dry and intact, no evidence of skin break down. IV site discontinued and catheter remains intact. Site without signs and symptoms of complications. Dressing and pressure applied.  Patient escorted to car by NT in a wheelchair,  no distress noted upon discharge.  Julien Nordmann [facility] 12/31/2013 4:11 PM

## 2013-12-31 NOTE — Discharge Summary (Signed)
PATIENT DETAILS Name: Sabrina QuailCynthia [name] Age: 58 y.o. Sex: female Date of Birth: 25-Jul-1955 MRN: [id number]. Admit Date: 12/30/2013 Admitting Physician: Lynden OxfordPranav Patel, MD WUJ:[id number]:Katherine Beverely Lowabori, MD  Recommendations for Outpatient Follow-up:  1. Please check CBC/BMET next visit  PRIMARY DISCHARGE DIAGNOSIS:  Principal Problem:   Lower GI bleed Active Problems:   HYPERTENSION   CROHN'S DISEASE-LARGE INTESTINE   Celiac disease   Aortic stenosis   Gastric ulcer with hemorrhage   Ascites   Cirrhosis of liver   S/P splenectomy   Diabetes mellitus type 2, noninsulin dependent      PAST MEDICAL HISTORY: Past Medical History  Diagnosis Date  . Arthritis   . Allergic rhinitis   . Colitis   . Degenerative disc disease   . Hyperlipidemia   . Hypertension   . Celiac disease   . Bipolar disorder   . Macular degeneration   . Interstitial cystitis   . Heart murmur   . Anxiety   . Depression   . Liver disease   . Gastric ulcer   . TIA (transient ischemic attack) 06/2013; ?10/13/2013  . Type II diabetes mellitus   . Anemia, iron deficiency 10/30/2012  . History of blood transfusion     "maybe 3-4; related to low counts" (10/13/2013)  . Hepatic encephalopathy   . Crohn's disease   . Cirrhosis     DISCHARGE MEDICATIONS:   Medication List    STOP taking these medications       aspirin 81 MG tablet      TAKE these medications       cetirizine 10 MG tablet  Commonly known as:  ZYRTEC  Take 10 mg by mouth daily.     feeding supplement (ENSURE COMPLETE) Liqd  Take 237 mLs by mouth 2 (two) times daily between meals.     FLUoxetine 40 MG capsule  Commonly known as:  PROZAC  Take 40 mg by mouth every morning.     furosemide 20 MG tablet  Commonly known as:  LASIX  Take 20 mg by mouth daily.     iron polysaccharides 150 MG capsule  Commonly known as:  POLY-IRON 150  Take 1 capsule (150 mg total) by mouth 2 (two) times daily.     lactulose 10 G packet  Commonly known  as:  CEPHULAC  Take 2 packets per day.     LORazepam 0.5 MG tablet  Commonly known as:  ATIVAN  Take 0.5 mg by mouth 2 (two) times daily.     mesalamine 1.2 G EC tablet  Commonly known as:  LIALDA  Take 1.2 g by mouth 2 (two) times daily.     multivitamin with minerals tablet  Take 1 tablet by mouth daily.     omeprazole 40 MG capsule  Commonly known as:  PRILOSEC  Take 40 mg by mouth daily.     pentosan polysulfate 100 MG capsule  Commonly known as:  ELMIRON  Take 200 mg by mouth 2 (two) times daily.     Potassium Carbonate Gran  Take 1 tablet by mouth daily.     rifaximin 550 MG Tabs tablet  Commonly known as:  XIFAXAN  Take 1 tablet (550 mg total) by mouth 2 (two) times daily.     SitaGLIPtin-MetFORMIN HCl [phone] MG Tb24  Commonly known as:  JANUMET XR  Take 1 tablet by mouth daily.  Start taking on:  01/01/2014     spironolactone 100 MG tablet  Commonly known as:  ALDACTONE  Take  1 tablet (100 mg total) by mouth daily.     traZODone 50 MG tablet  Commonly known as:  DESYREL  Take 50 mg by mouth at bedtime.        ALLERGIES:   Allergies  Allergen Reactions  . Sulfonamide Derivatives     Classic angioedema reaction  . Wheat Bran     Celiac's disease    BRIEF HPI:  See H&P, Labs, Consult and Test reports for all details in brief, Sabrina [name] is a 57 y.o. female with Past medical history of cirrhosis, ascites, hypertension, CVA disease, Crohn's disease, diabetes mellitus. Patient presented with complaints of bright red blood per rectum  CONSULTATIONS:   GI  PERTINENT RADIOLOGIC STUDIES: Ct Cta Abd/pel W/cm &/or W/o Cm  12/30/2013   CLINICAL DATA:  Upper abdominal pain. Abdominal distention. Rectal bleeding.  EXAM: CTA ABDOMEN AND PELVIS wITHOUT AND WITH CONTRAST  TECHNIQUE: Multidetector CT imaging of the abdomen and pelvis was performed using the standard protocol during bolus administration of intravenous contrast. Multiplanar reconstructed images  and MIPs were obtained and reviewed to evaluate the vascular anatomy.  CONTRAST:  OMNIPAQUE IOHEXOL 350 MG/ML SOLN  COMPARISON:  03/24/2013  FINDINGS: Bibasilar atelectasis.  Heart is mildly enlarged.  No effusions.  Changes of severe cirrhosis with nodular liver, enlargement of the left hepatic lobe atrophy of the right hepatic lobe. Moderate to large volume ascites throughout the abdomen and pelvis.  Prior cholecystectomy. Prior splenectomy. Pancreas, adrenals and kidneys are unremarkable. Uterus, adnexae and urinary bladder grossly unremarkable.  Evaluation of the large and small bowel somewhat limited without intravenous contrast and with the ascites. No definite focal abnormality. Mildly prominent mesenteric lymph nodes centrally. These are presumably related to congestion or reactive. Aorta is normal caliber. Incidentally noted is a retro aortic left renal vein.  Review of the MIP images confirms the above findings.  IMPRESSION: Severe cirrhosis with moderate to large volume ascites.  Prior cholecystectomy and splenectomy.  Bibasilar atelectasis.   Electronically Signed   By: Charlett Nose M.D.   On: 12/30/2013 20:24     PERTINENT LAB RESULTS: CBC:  Recent Labs  12/31/13 0549 12/31/13 1343  WBC 10.3 11.6*  HGB 11.3* 12.4  HCT 32.7* 35.6*  PLT 195 228   CMET CMP     Component Value Date/Time   NA 136* 12/31/2013 0549   K 4.2 12/31/2013 0549   CL 106 12/31/2013 0549   CO2 20 12/31/2013 0549   GLUCOSE 109* 12/31/2013 0549   BUN 5* 12/31/2013 0549   CREATININE 0.47* 12/31/2013 0549   CREATININE 0.54 03/24/2013 1357   CALCIUM 8.1* 12/31/2013 0549   PROT 7.0 12/31/2013 0549   ALBUMIN 2.1* 12/31/2013 0549   AST 73* 12/31/2013 0549   ALT 27 12/31/2013 0549   ALKPHOS 88 12/31/2013 0549   BILITOT 1.9* 12/31/2013 0549   GFRNONAA >90 12/31/2013 0549   GFRAA >90 12/31/2013 0549    GFR Estimated Creatinine Clearance: 71.8 ml/min (by C-G formula based on Cr of 0.47).  Recent Labs  12/30/13 1458  LIPASE 56    No results found for this basename: CKTOTAL, CKMB, CKMBINDEX, TROPONINI,  in the last 72 hours No components found with this basename: POCBNP,  No results found for this basename: DDIMER,  in the last 72 hours No results found for this basename: HGBA1C,  in the last 72 hours No results found for this basename: CHOL, HDL, LDLCALC, TRIG, CHOLHDL, LDLDIRECT,  in the last 72 hours No results  found for this basename: TSH, T4TOTAL, FREET3, T3FREE, THYROIDAB,  in the last 72 hours No results found for this basename: VITAMINB12, FOLATE, FERRITIN, TIBC, IRON, RETICCTPCT,  in the last 72 hours Coags:  Recent Labs  12/30/13 1458  INR 1.87*   Microbiology: No results found for this or any previous visit (from the past 240 hour(s)).   BRIEF HOSPITAL COURSE:   Principal Problem:  Lower GI bleed  - Admitted with 3 episodes of hematochezia, etiology not very evident, suspicion for either ischemic colitis or diverticular bleeding. Bleeding seems to have resolved, patient had a brown stool today. Seen by GI, per Dr Marina Goodell (this MD spoke to him over the phone), ok to discharge. Hb remained stable, no need to transfuse.  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 Given ascites seen on CT abdomen,will add aldactone on discharge. Continue Lasix  Large volume ascites  - Seen on CT scan, however abdomen is not tense. No indication for paracentesis at this time. No fever, nontoxic looking, low suspicion for SBP.   History of Crohn's disease  - Continue with mesalamine. Currently without diarrhea or ongoing abdominal pain to suggest a  flare   DM-2  - CBGs stable-continue outpatient medications as noted above  TODAY-DAY OF DISCHARGE:  Subjective:   Poet Hineman today has no headache,no chest abdominal pain,no new weakness tingling or numbness, feels much better wants to go home today. Had brown stool today  Objective:   Blood pressure 123/71, pulse 89, temperature 98 F (36.7 C), temperature source Oral, resp. rate 16, height 5\' 4"  (1.626 m), weight 68.176 kg (150 lb 4.8 oz), SpO2 92.00%.  Intake/Output Summary (Last 24 hours) at 12/31/13 1452 Last data filed at 12/31/13 1022  Gross per 24 hour  Intake   1153 ml  Output      0 ml  Net   1153 ml   Filed Weights   12/30/13 1445 12/30/13 2241 12/31/13 0524  Weight: 67.699 kg (149 lb 4 oz) 69.7 kg (153 lb 10.6 oz) 68.176 kg (150 lb 4.8 oz)    Exam Awake Alert, Oriented *3, No new F.N deficits, Normal affect Melvin.AT,PERRAL Supple Neck,No JVD, No cervical lymphadenopathy appriciated.  Symmetrical Chest wall movement, Good air movement bilaterally, CTAB RRR,No Gallops,Rubs or new Murmurs, No Parasternal Heave +ve B.Sounds, Abd Soft, Non tender, No organomegaly appriciated, No rebound -guarding or rigidity. No Cyanosis, Clubbing or edema, No new Rash or bruise  DISCHARGE CONDITION: Stable  DISPOSITION: Home  DISCHARGE INSTRUCTIONS:    Activity:  As tolerated   Diet recommendation: Diabetic Diet Heart Healthy diet      Discharge Instructions   Call MD for:  persistant nausea and vomiting    Complete by:  As directed      Call MD for:    Complete by:  As directed   If rectal bleeding recurs     Diet - low sodium heart healthy    Complete by:  As directed      Diet Carb Modified    Complete by:  As directed      Increase activity slowly    Complete by:  As directed  Follow-up Information   Follow up with Neena Rhymes, MD. Schedule an appointment as soon as possible for a visit in 1 week.   Specialty:  Family Medicine    Contact information:   [phone] [address] [place] [address] [phone]       Follow up with [name], MD. Schedule an appointment as soon as possible for a visit in 2 weeks.   Specialty:  Gastroenterology   Contact information:   [address] [place] [address] [phone]      Total Time spent on discharge equals 45 minutes.  SignedJeoffrey Massed 12/31/2013 2:52 PM  **Disclaimer: This note may have been dictated with voice recognition software. Similar sounding words can inadvertently be transcribed and this note may contain transcription errors which may not have been corrected upon publication of note.**

## 2013-12-31 NOTE — Consult Note (Signed)
[facility] Consult: 8:22 AM 12/31/2013  LOS: 1 day    Referring Provider: Dr Maia Petties Primary Care Physician:  Neena Rhymes, MD Primary Gastroenterologist:  Dr. [name]    Reason for Consultation:  Hematochezia.    HPI: Zoraya Fiorenza is a 58 y.o. female.  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 S/p cholecystectomy, splenectomy and many other surgeries.  Meds include Xifaxan, Lactulose, Iron, Lialda. Upcoming appt next week with [facility] liver transplant MD.  Latest EGD, to follow up 10/2012 gastric ulcer (melena, anemia then) showed healed ulcer with residual nodule. Bx: Latest colonoscopy 06/2012: Normal to TI.  Yesterday AM took her Lactulose, had 2 loose stools, 3rd stool was BRB which transformed the commode water to bloody.  She had tense abdominal bloating and diffuse abdominal pain.  Next episode of hematochezia was in ED ~ 6 PM. Pain subsided over several hours.  Some nausea in ED, resolved and never recurred after dose of Zofran.  On non contrast CT angio has the cirrhosis and large volume ascites, prominent central mesenteric nodes. Large volume ascites seen on 02/2013 CT as well.  INR is 1.8, was 2.2 to 2.0 four weeks and six weeks ago. Hgb 12.7, on recheck today 11.3.  WBC count of 15.6, down to 10.3 after interval Rocephin initiation. Also getting BID IV Protonix but on Omeprazole 40 mg at home.   States last Thursday 8# weight gain in 24 hours, no subsequent weight gain but abdominal girth increased at that time, but was acutely worse yesterday.  No extremity edema.   No anorexia, no unusual bleeding/bruising.  No mental status changes or somnolence.  Compliant with diet and  meds. No NSAIDs Previous attempt at paracentesis right after splenectomy of 01/2013 was unsuccesful, unable to "find window".  No hx SBP.  No pruritus. No sores or rash.  Not clear she has received Hep A/B vaccination.      Past Medical History  Diagnosis Date  . Arthritis   . Allergic rhinitis   . Colitis   . Degenerative disc disease   . Hyperlipidemia   . Hypertension   . Celiac disease   . Bipolar disorder   . Macular degeneration   . Interstitial cystitis   . Heart murmur   . Anxiety   . Depression   . Liver disease   . Gastric ulcer   . TIA (transient ischemic attack) 06/2013; ?10/13/2013  . Type II diabetes mellitus   . Anemia, iron deficiency 10/30/2012  . History of blood transfusion     "maybe 3-4; related to low counts" (10/13/2013)  . Hepatic encephalopathy   . Crohn's disease   . Cirrhosis     Past Surgical History  Procedure Laterality Date  . Appendectomy    . Lumbar laminectomy    . Carpal tunnel release Bilateral   . Cesarean section  1983  . Cysto/ hod/ instillatio clorpactin  07-03-2008  &  06-09-2001    INTERSTITIAL CYSTITIS  . Left  shoulder /   including capsulectomy with debridement/  sad with resection of ac Left 01-25-2008  . Right shoulder arthroscopy/ open resection distal clavicle/ debridement adhesive capsulitis/ rotator cuff repair Right 08-30-2003  . Tonsillectomy and adenoidectomy    . Transthoracic echocardiogram  03-28-2011    NORMAL LVSF/ EF 60-65%/ VERY MILD AORTIC STENOSIS, TRIVIAL REGURG./ MILDLY DILATED LEFT ATRIUM  . Cystoscopy  11/26/2011    Procedure: CYSTOSCOPY;  Surgeon: Valetta Fuller, MD;  Location: [facility];  Service: Urology;  Laterality: N/A;  clorpactin  in bladder  . 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 Cholecystectomy  12/2012  . Tubal ligation  1983  . Back surgery       Prior to Admission medications   Medication Sig Start Date End Date Taking? Authorizing Provider  aspirin 81 MG tablet Take 1 tablet (81 mg total) by mouth daily. 07/23/13  Yes Clanford Cyndie Mull, MD  cetirizine (ZYRTEC) 10 MG tablet Take 10 mg by mouth daily.   Yes Historical Provider, MD  feeding supplement, ENSURE COMPLETE, (ENSURE COMPLETE) LIQD Take 237 mLs by mouth 2 (two) times daily between meals. 10/16/13  Yes Belkys A Regalado, MD  FLUoxetine (PROZAC) 40 MG capsule Take 40 mg by mouth every morning.    Yes Historical Provider, MD  furosemide (LASIX) 20 MG tablet Take 20 mg by mouth daily.   Yes Historical Provider, MD  iron polysaccharides (POLY-IRON 150) 150 MG capsule Take 1 capsule (150 mg total) by mouth 2 (two) times daily. 09/05/13  Yes Hilarie Fredrickson, MD  lactulose (CEPHULAC) 10 G packet Take 2 packets per day. 12/23/13  Yes Hilarie Fredrickson, MD  LORazepam (ATIVAN) 0.5 MG tablet Take 0.5 mg by mouth 2 (two) times daily.    Yes Historical Provider, MD  mesalamine (LIALDA) 1.2 G EC tablet Take 1.2 g by mouth 2 (two) times daily.   Yes Historical Provider, MD  Multiple Vitamins-Minerals (MULTIVITAMIN WITH MINERALS) tablet Take 1 tablet by mouth daily.   Yes Historical Provider, MD  omeprazole (PRILOSEC) 40 MG capsule Take 40 mg by mouth daily.   Yes Historical Provider, MD  pentosan polysulfate (ELMIRON) 100 MG capsule Take 200 mg by mouth 2 (two) times daily.    Yes Historical Provider, MD  Potassium Carbonate GRAN Take 1 tablet by mouth daily.   Yes Historical Provider, MD  rifaximin (XIFAXAN) 550 MG TABS tablet Take 1 tablet (550 mg total) by mouth 2 (two) times daily. 10/27/13  Yes Amy S Esterwood, PA-C  SitaGLIPtin-MetFORMIN HCl (JANUMET XR) [phone] MG TB24 Take 1 tablet by mouth daily. 12/10/13  Yes Sheliah Hatch, MD  traZODone (DESYREL) 50 MG tablet Take 50 mg by mouth at bedtime.   Yes Historical Provider, MD    Scheduled Meds: . cefTRIAXone (ROCEPHIN)  IV  1 g Intravenous  Q24H  . FLUoxetine  40 mg Oral q morning - 10a  . insulin aspart  0-15 Units Subcutaneous TID WC  . insulin aspart  0-5 Units Subcutaneous QHS  . lactulose  10 g Oral Daily  . LORazepam  0.5 mg Oral BID  . mesalamine  1.2 g Oral BID  . pantoprazole (PROTONIX) IV  40 mg Intravenous Q12H  . pentosan polysulfate  200 mg Oral BID  . rifaximin  550 mg Oral BID  . sodium chloride  3  mL Intravenous Q12H  . traZODone  50 mg Oral QHS   Infusions:   PRN Meds: ondansetron (ZOFRAN) IV, ondansetron   Allergies as of 12/30/2013 - Review Complete 12/30/2013  Allergen Reaction Noted  . Sulfonamide derivatives    . Wheat bran  07/07/2012    Family History  Problem Relation Age of Onset  . Diabetes Mother   . Hypertension Mother   . Diabetes Brother   . Hypertension Brother   . Hyperlipidemia Brother   . Hypertension    . Colon cancer Neg Hx     History   Social History  . Marital Status: Married    Spouse Name: N/A    Number of Children: 1  . Years of Education: N/A   Occupational History  . 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 Smoking status: Never Smoker   . Smokeless tobacco: Never Used  . Alcohol Use: No  . Drug Use: No  . Sexual Activity: No   Other Topics Concern  . Not on file   Social History Narrative   [place], [place]    REVIEW OF SYSTEMS: Per HPI.  12 system review completed.    PHYSICAL EXAM: Vital signs in last 24 hours: Filed Vitals:   12/31/13 0524  BP: 123/71  Pulse: 89  Temp: 98 F (36.7 C)  Resp: 16   Wt Readings from Last 3 Encounters:  12/31/13 68.176 kg (150 lb 4.8 oz)  12/22/13 65.499 kg (144 lb 6.4 oz)  12/09/13 64.467 kg (142 lb 2 oz)    General: pleasant, does not look ill Head:  No swellilng, trauma or asymmetry  Eyes:  No icterus or pallor Ears:  Not HOH  Nose:  No congestion Mouth:  Clear, moist Neck:  No mass, no JVD Lungs:  Fine crackles  at bases.  No dyspnea or cough Heart: RRR with 2/6 murmer systolic Abdomen:  Soft, mild protuberance.   Rectal: no mass, not tender.  Scant fresh blood on glove   Musc/Skeltl: no joint deformity or swelling  Extremities:  No peripheral edema  Neurologic:  Oriented x 3.  Excellent historian.  No tremor, no asterixis.  No limb weakness Skin:  Telangectasia on upper trunk Tattoos:  none Nodes:  No cervical adenopathy   Psych:  Pleasant, in good spirits.  Relaxed.   Intake/Output from previous day: 07/03 0701 - 07/04 0700 In: 1000 [I.V.:1000] Out: -  Intake/Output this shift:    LAB RESULTS:  Recent Labs  12/30/13 1458 12/30/13 2304 12/31/13 0549  WBC 12.3* 15.6* 10.3  HGB 12.7 12.7 11.3*  HCT 35.2* 35.5* 32.7*  PLT 241 230 195   BMET Lab Results  Component Value Date   NA 136* 12/31/2013   NA 134* 12/30/2013   NA 128* 12/09/2013   K 4.2 12/31/2013   K 4.3 12/30/2013   K 4.6 12/09/2013   CL 106 12/31/2013   CL 101 12/30/2013   CL 96 12/09/2013   CO2 20 12/31/2013   CO2 21 12/30/2013   CO2 25 12/09/2013   GLUCOSE 109* 12/31/2013   GLUCOSE 181* 12/30/2013   GLUCOSE 397* 12/09/2013   BUN 5* 12/31/2013   BUN 7 12/30/2013   BUN 8 12/09/2013   CREATININE 0.47* 12/31/2013   CREATININE 0.46* 12/30/2013   CREATININE 0.7 12/09/2013   CALCIUM 8.1* 12/31/2013   CALCIUM 9.0 12/30/2013   CALCIUM 9.0 12/09/2013   LFT  Recent Labs  12/30/13 1458 12/31/13 0549  PROT 8.7* 7.0  ALBUMIN 2.6* 2.1*  AST 80* 73*  ALT 34 27  ALKPHOS 123* 88  BILITOT 2.4* 1.9*   PT/INR Lab Results  Component Value Date   INR 1.87* 12/30/2013   INR 2.0* 12/05/2013   INR 2.2* 11/15/2013   Hepatitis Panel No results found for this basename: HEPBSAG, HCVAB, HEPAIGM, HEPBIGM,  in the last 72 hours C-Diff No components found with this basename: cdiff   Lipase     Component Value Date/Time   LIPASE 56 12/30/2013 1458     RADIOLOGY STUDIES: Ct Cta Abd/pel W/cm &/or W/o Cm 12/30/2013   CLINICAL DATA:  Upper abdominal pain.  Abdominal distention. Rectal bleeding.  EXAM: CTA ABDOMEN AND PELVIS wITHOUT AND WITH CONTRAST  TECHNIQUE: Multidetector CT imaging of the abdomen and pelvis was performed using the standard protocol during bolus administration of intravenous contrast. Multiplanar reconstructed images and MIPs were obtained and reviewed to evaluate the vascular anatomy.  CONTRAST:  OMNIPAQUE IOHEXOL 350 MG/ML SOLN  COMPARISON:  03/24/2013  FINDINGS: Bibasilar atelectasis.  Heart is mildly enlarged.  No effusions.  Changes of severe cirrhosis with nodular liver, enlargement of the left hepatic lobe atrophy of the right hepatic lobe. Moderate to large volume ascites throughout the abdomen and pelvis.  Prior cholecystectomy. Prior splenectomy. Pancreas, adrenals and kidneys are unremarkable. Uterus, adnexae and urinary bladder grossly unremarkable.  Evaluation of the large and small bowel somewhat limited without intravenous contrast and with the ascites. No definite focal abnormality. Mildly prominent mesenteric lymph nodes centrally. These are presumably related to congestion or reactive. Aorta is normal caliber. Incidentally noted is a retro aortic left renal vein.  Review of the MIP images confirms the above findings.  IMPRESSION: Severe cirrhosis with moderate to large volume ascites.  Prior cholecystectomy and splenectomy.  Bibasilar atelectasis.   Electronically Signed   By: Charlett Nose M.D.   On: 12/30/2013 20:24    ENDOSCOPIC STUDIES: 06/2013  EGD INDICATIONS: Surveillance. IMPRESSION: Healed ulcer w/ residual nodule (biopsied)  12/2012 EGD INDICATIONS: ulcer follow up. ENDOSCOPIC IMPRESSION:  1. Improved appearrance of ulcer, but still with residual ulceration  10/2012  EGD INDICATIONS: Melena. ENDOSCOPIC IMPRESSION:  1. Gastric ulcer. Source for recent GI bleed. Status post CLO biopsy  2. Celiac disease  06/2012  Colonoscopy To TI: normal, no Crohn's   01/2005 Capsule Endoscopy Villous blunting,  scatterd patchy erythema and some areas of mosaic patterning.   EGDs 2009, 2008, 2006, 2004  Colonoscopy 2003   IMPRESSION:   *  Hematochezia with abd pain.  Wonder about ischemic colitis, though not evident on CT angio. Pain resolved and last bloody episode was 6 PM yesterday.   *  Crohn's disease  *  Cirrhosis of liver  *  Large volume ascites. unsuccesful attempt at paracentesis summer 2014.  No sxs to suggest SBP.   *  Thrombocytopenia.  01/2013 Splenectomy.     PLAN:     *  Gluten free diet.  *  Switch back to gluten free diet *  CBC in AM *  Keep upcoming appt with [facility] hepatology next Tuesday.    Jennye Moccasin  12/31/2013, 8:22 AM Pager: [phone]  GI ATTENDING  History, laboratories, x-rays reviewed. Patient personally seen and examined. Agree with above H&P. Delora is very well known to me for multiple GI problems. Most recent issue surrounding her liver disease. Presents with minor hematochezia which has resolved. I witnessed her last stool which was  brown without any blood. Her abdominal discomfort is almost certainly secondary to some progressive ascites. She has been on furosemide. We will add spironolactone 100 mg daily. Okay for her to go home today. She does contact me for any problems. Anticipate consultation with [facility] transplant hepatology on Tuesday. Discussed with attending hospitalist physician and patient.  Wilhemina Bonito. Eda Keys., M.D. [facility] Division of Gastroenterology

## 2013-12-31 NOTE — Progress Notes (Signed)
PATIENT DETAILS Name: Sabrina [name] Age: 58 y.o. Sex: female Date of Birth: [birth year] Admit Date: 12/30/2013 Admitting Physician Lynden Oxford, MD [id number] Beverely Low, MD  Subjective: No further hematochezia since 10:30 PM last night  Assessment/Plan: Principal Problem:   Lower GI bleed - Admitted with 3 episodes of hematochezia, etiology not very evident, suspicion for either ischemic colitis or diverticular bleeding. Bleeding seems to have slowed down and has almost resolved. - Continue to monitor H&H, await formal GI evaluation.  Active Problems: Acute blood loss anemia - Likely secondary to above, hemoglobin on presentation was 12.7, it has decreased slightly to 11.3. Continue to monitor periodically and transfuse as needed.  History of liver cirrhosis - Cryptogenic etiology- thought to be either autoimmune or NASH, has been evaluated by gastroenterology as an outpatient, has been referred to [facility] hepatology-next appointment on 7/7 - Continue rifaximin, lactulose. Given ascites seen on CT abdomen, would benefit from initiation of diuretics on discharge.  Large volume ascites - Seen on CT scan, however abdomen is not tense. No indication for paracentesis at this time. No fever, nontoxic looking, low suspicion for SBP.  History of Crohn's disease - Continue with mesalamine. Currently without diarrhea or ongoing abdominal pain to suggest a flare   DM-2 - CBGs stable-continue  SSI  Disposition: Remain inpatient  DVT Prophylaxis: SCD's  Code Status: Full code  Family Communication None at bedside  Procedures:  None  CONSULTS:  GI  Time spent 40 minutes-which includes 50% of the time with face-to-face with patient/ family and coordinating care related to the above assessment and plan.    MEDICATIONS: Scheduled Meds: . cefTRIAXone (ROCEPHIN)  IV  1 g Intravenous Q24H  . FLUoxetine  40 mg Oral q morning - 10a  . insulin aspart  0-15 Units  Subcutaneous TID WC  . insulin aspart  0-5 Units Subcutaneous QHS  . lactulose  10 g Oral Daily  . LORazepam  0.5 mg Oral BID  . mesalamine  1.2 g Oral BID  . pantoprazole  40 mg Oral Q0600  . pentosan polysulfate  200 mg Oral BID  . rifaximin  550 mg Oral BID  . sodium chloride  3 mL Intravenous Q12H  . traZODone  50 mg Oral QHS   Continuous Infusions:  PRN Meds:.ondansetron (ZOFRAN) IV, ondansetron  Antibiotics: Anti-infectives   Start     Dose/Rate Route Frequency Ordered Stop   12/30/13 2300  cefTRIAXone (ROCEPHIN) 1 g in dextrose 5 % 50 mL IVPB     1 g 100 mL/hr over 30 Minutes Intravenous Every 24 hours 12/30/13 2242     12/30/13 2245  rifaximin (XIFAXAN) tablet 550 mg     550 mg Oral 2 times daily 12/30/13 2241         PHYSICAL EXAM: Vital signs in last 24 hours: Filed Vitals:   12/30/13 2100 12/30/13 2145 12/30/13 2241 12/31/13 0524  BP: 132/63 131/60 145/76 123/71  Pulse: 83 82 82 89  Temp:   97.9 F (36.6 C) 98 F (36.7 C)  TempSrc:   Oral Oral  Resp: 14 18 18 16   Height:   5\' 4"  (1.626 m)   Weight:   69.7 kg (153 lb 10.6 oz) 68.176 kg (150 lb 4.8 oz)  SpO2: 97% 95% 94% 92%    Weight change:  Filed Weights   12/30/13 1445 12/30/13 2241 12/31/13 0524  Weight: 67.699 kg (149 lb 4 oz) 69.7 kg (153 lb 10.6 oz) 68.176  kg (150 lb 4.8 oz)   Body mass index is 25.79 kg/(m^2).   Gen Exam: Awake and alert with clear speech.   Neck: Supple, No JVD.   Chest: B/L Clear.   CVS: S1 S2 Regular, no murmurs.  Abdomen: soft, BS +, non tender, non distended.  Extremities: no edema, lower extremities warm to touch. Neurologic: Non Focal.   Skin: No Rash.   Wounds: N/A.    Intake/Output from previous day:  Intake/Output Summary (Last 24 hours) at 12/31/13 1246 Last data filed at 12/31/13 1022  Gross per 24 hour  Intake   1153 ml  Output      0 ml  Net   1153 ml     LAB RESULTS: CBC  Recent Labs Lab 12/30/13 1458 12/30/13 2304 12/31/13 0549  WBC 12.3*  15.6* 10.3  HGB 12.7 12.7 11.3*  HCT 35.2* 35.5* 32.7*  PLT 241 230 195  MCV 94.4 94.2 97.0  MCH 34.0 33.7 33.5  MCHC 36.1* 35.8 34.6  RDW 21.1* 21.4* 21.0*  LYMPHSABS 3.4  --   --   MONOABS 1.5*  --   --   EOSABS 0.1  --   --   BASOSABS 0.1  --   --     Chemistries   Recent Labs Lab 12/30/13 1458 12/31/13 0549  NA 134* 136*  K 4.3 4.2  CL 101 106  CO2 21 20  GLUCOSE 181* 109*  BUN 7 5*  CREATININE 0.46* 0.47*  CALCIUM 9.0 8.1*    CBG:  Recent Labs Lab 12/30/13 1728 12/30/13 2322 12/31/13 0817 12/31/13 1215  GLUCAP 128* 84 104* 126*    GFR Estimated Creatinine Clearance: 71.8 ml/min (by C-G formula based on Cr of 0.47).  Coagulation profile  Recent Labs Lab 12/30/13 1458  INR 1.87*    Cardiac Enzymes No results found for this basename: CK, CKMB, TROPONINI, MYOGLOBIN,  in the last 168 hours  No components found with this basename: POCBNP,  No results found for this basename: DDIMER,  in the last 72 hours No results found for this basename: HGBA1C,  in the last 72 hours No results found for this basename: CHOL, HDL, LDLCALC, TRIG, CHOLHDL, LDLDIRECT,  in the last 72 hours No results found for this basename: TSH, T4TOTAL, FREET3, T3FREE, THYROIDAB,  in the last 72 hours No results found for this basename: VITAMINB12, FOLATE, FERRITIN, TIBC, IRON, RETICCTPCT,  in the last 72 hours  Recent Labs  12/30/13 1458  LIPASE 56    Urine Studies No results found for this basename: UACOL, UAPR, USPG, UPH, UTP, UGL, UKET, UBIL, UHGB, UNIT, UROB, ULEU, UEPI, UWBC, URBC, UBAC, CAST, CRYS, UCOM, BILUA,  in the last 72 hours  MICROBIOLOGY: No results found for this or any previous visit (from the past 240 hour(s)).  RADIOLOGY STUDIES/RESULTS: Ct Cta Abd/pel W/cm &/or W/o Cm  12/30/2013   CLINICAL DATA:  Upper abdominal pain. Abdominal distention. Rectal bleeding.  EXAM: CTA ABDOMEN AND PELVIS wITHOUT AND WITH CONTRAST  TECHNIQUE: Multidetector CT imaging of the  abdomen and pelvis was performed using the standard protocol during bolus administration of intravenous contrast. Multiplanar reconstructed images and MIPs were obtained and reviewed to evaluate the vascular anatomy.  CONTRAST:  OMNIPAQUE IOHEXOL 350 MG/ML SOLN  COMPARISON:  03/24/2013  FINDINGS: Bibasilar atelectasis.  Heart is mildly enlarged.  No effusions.  Changes of severe cirrhosis with nodular liver, enlargement of the left hepatic lobe atrophy of the right hepatic lobe. Moderate to large  volume ascites throughout the abdomen and pelvis.  Prior cholecystectomy. Prior splenectomy. Pancreas, adrenals and kidneys are unremarkable. Uterus, adnexae and urinary bladder grossly unremarkable.  Evaluation of the large and small bowel somewhat limited without intravenous contrast and with the ascites. No definite focal abnormality. Mildly prominent mesenteric lymph nodes centrally. These are presumably related to congestion or reactive. Aorta is normal caliber. Incidentally noted is a retro aortic left renal vein.  Review of the MIP images confirms the above findings.  IMPRESSION: Severe cirrhosis with moderate to large volume ascites.  Prior cholecystectomy and splenectomy.  Bibasilar atelectasis.   Electronically Signed   By: Charlett NoseKevin  Dover M.D.   On: 12/30/2013 20:24    Jeoffrey MassedGHIMIRE,SHANKER, MD  [facility] Pager:[phone]  If 7PM-7AM, please contact night-coverage [email or web address] Password [email or web address] 12/31/2013, 12:46 PM   LOS: 1 day   **Disclaimer: This note may have been dictated with voice recognition software. Similar sounding words can inadvertently be transcribed and this note may contain transcription errors which may not have been corrected upon publication of note.**

## 2013-12-31 NOTE — H&P (Signed)
[facility] History and Physical  [name]  NWG:[id number]  DOB: 12-14-1955  DOS: the patient was seen and examined on 12/30/2013 PCP: Sabrina Rhymes, MD  Chief Complaint: Bright red blood per rectum  HPI: Sabrina [name] is a 58 y.o. female with Past medical history of cirrhosis, ascites, hypertension, CVA disease, Crohn's disease, diabetes mellitus. Patient presented with complaints of bright red blood per rectum. She has so far 2 episodes of the same. She mentions she takes lactulose on a daily basis. Yesterday she took one dose of lactulose and had 10 bowel movements. Today she took one dose of lactulose and she had 2 regular bowel movements and her third bowel movement was bright red blood. She had another bowel movement when she was in the ED which was bright red blood in it. She also complain of some lower abdominal pain which is at present better. She denies any vomiting but had some nausea. She also denies any heartburn. She denies any dizziness or lightheadedness no chest pain or shortness of breath and she denies any recent change in her medication.  The patient is coming from home. And at her baseline independent for most of her ADL.  Review of Systems: as mentioned in the history of present illness.  A Comprehensive review of the other systems is negative.  Past Medical History  Diagnosis Date  . Arthritis   . Allergic rhinitis   . Colitis   . Degenerative disc disease   . Hyperlipidemia   . Hypertension   . Celiac disease   . Bipolar disorder   . Macular degeneration   . Interstitial cystitis   . Heart murmur   . Anxiety   . Depression   . Liver disease   . Gastric ulcer   . TIA (transient ischemic attack) 06/2013; ?10/13/2013  . Type II diabetes mellitus   . Anemia, iron deficiency 10/30/2012  . History of blood transfusion     "maybe 3-4; related to low counts" (10/13/2013)  . Hepatic encephalopathy   . Crohn's disease   . Cirrhosis     Past Surgical History  Procedure Laterality Date  . Appendectomy    . Lumbar laminectomy    . Carpal tunnel release Bilateral   . Cesarean section  1983  . Cysto/ hod/ instillatio clorpactin  07-03-2008  &  06-09-2001    INTERSTITIAL CYSTITIS  . Left shoulder /   including capsulectomy with debridement/  sad with resection of ac Left 01-25-2008  . Right shoulder arthroscopy/ open resection distal clavicle/ debridement adhesive capsulitis/ rotator cuff repair Right 08-30-2003  . Tonsillectomy and adenoidectomy    . Transthoracic echocardiogram  03-28-2011    NORMAL LVSF/ EF 60-65%/ VERY MILD AORTIC STENOSIS, TRIVIAL REGURG./ MILDLY DILATED LEFT ATRIUM  . Cystoscopy  11/26/2011    Procedure: CYSTOSCOPY;  Surgeon: Valetta Fuller, MD;  Location: [facility];  Service: Urology;  Laterality: N/A;  clorpactin  in bladder  . 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 Cholecystectomy  12/2012  . Tubal ligation  1983  . Back surgery     Social History:  reports that she has never smoked. She has never used smokeless tobacco. She reports that she does not drink alcohol or use illicit drugs.  Allergies  Allergen Reactions  . Sulfonamide Derivatives     Classic angioedema reaction  .  Wheat Bran     Celiac's disease    Family History  Problem Relation Age of Onset  . Diabetes Mother   . Hypertension Mother   . Diabetes Brother   . Hypertension Brother   . Hyperlipidemia Brother   . Hypertension    . Colon cancer Neg Hx     Prior to Admission medications   Medication Sig Start Date End Date Taking? Authorizing Provider  aspirin 81 MG tablet Take 1 tablet (81 mg total) by mouth daily. 07/23/13  Yes Clanford Cyndie MullL Johnson, MD  cetirizine (ZYRTEC) 10 MG tablet Take 10 mg by mouth daily.   Yes Historical Provider, MD  feeding supplement, ENSURE  COMPLETE, (ENSURE COMPLETE) LIQD Take 237 mLs by mouth 2 (two) times daily between meals. 10/16/13  Yes Belkys A Regalado, MD  FLUoxetine (PROZAC) 40 MG capsule Take 40 mg by mouth every morning.    Yes Historical Provider, MD  furosemide (LASIX) 20 MG tablet Take 20 mg by mouth daily.   Yes Historical Provider, MD  iron polysaccharides (POLY-IRON 150) 150 MG capsule Take 1 capsule (150 mg total) by mouth 2 (two) times daily. 09/05/13  Yes Hilarie FredricksonJohn N Perry, MD  lactulose (CEPHULAC) 10 G packet Take 2 packets per day. 12/23/13  Yes Hilarie FredricksonJohn N Perry, MD  LORazepam (ATIVAN) 0.5 MG tablet Take 0.5 mg by mouth 2 (two) times daily.    Yes Historical Provider, MD  mesalamine (LIALDA) 1.2 G EC tablet Take 1.2 g by mouth 2 (two) times daily.   Yes Historical Provider, MD  Multiple Vitamins-Minerals (MULTIVITAMIN WITH MINERALS) tablet Take 1 tablet by mouth daily.   Yes Historical Provider, MD  omeprazole (PRILOSEC) 40 MG capsule Take 40 mg by mouth daily.   Yes Historical Provider, MD  pentosan polysulfate (ELMIRON) 100 MG capsule Take 200 mg by mouth 2 (two) times daily.    Yes Historical Provider, MD  Potassium Carbonate GRAN Take 1 tablet by mouth daily.   Yes Historical Provider, MD  rifaximin (XIFAXAN) 550 MG TABS tablet Take 1 tablet (550 mg total) by mouth 2 (two) times daily. 10/27/13  Yes Amy S Esterwood, PA-C  SitaGLIPtin-MetFORMIN HCl (JANUMET XR) [phone] MG TB24 Take 1 tablet by mouth daily. 12/10/13  Yes Sheliah HatchKatherine E Tabori, MD  traZODone (DESYREL) 50 MG tablet Take 50 mg by mouth at bedtime.   Yes Historical Provider, MD    Physical Exam: Filed Vitals:   12/30/13 2100 12/30/13 2145 12/30/13 2241 12/31/13 0524  BP: 132/63 131/60 145/76 123/71  Pulse: 83 82 82 89  Temp:   97.9 F (36.6 C) 98 F (36.7 C)  TempSrc:   Oral Oral  Resp: 14 18 18 16   Height:   5\' 4"  (1.626 m)   Weight:   69.7 kg (153 lb 10.6 oz) 68.176 kg (150 lb 4.8 oz)  SpO2: 97% 95% 94% 92%    General: Alert, Awake and Oriented to  Time, Place and Person. Appear in mild distress Eyes: PERRL ENT: Oral Mucosa clear moist. Neck: No JVD Cardiovascular: S1 and S2 Present, aortic systolic Murmur, Peripheral Pulses Present Respiratory: Bilateral Air entry equal and Decreased, basal Crackles, no wheezes Abdomen: Bowel Sound Present, Soft and mild diffuse tender, distended, ascites present Skin: No Rash Extremities: Bilateral Pedal edema, no calf tenderness Neurologic: Grossly no focal neuro deficit.  Labs on Admission:  CBC:  Recent Labs Lab 12/30/13 1458 12/30/13 2304 12/31/13 0549  WBC 12.3* 15.6* 10.3  NEUTROABS 7.2  --   --  HGB 12.7 12.7 11.3*  HCT 35.2* 35.5* 32.7*  MCV 94.4 94.2 97.0  PLT 241 230 195    CMP     Component Value Date/Time   NA 134* 12/30/2013 1458   K 4.3 12/30/2013 1458   CL 101 12/30/2013 1458   CO2 21 12/30/2013 1458   GLUCOSE 181* 12/30/2013 1458   BUN 7 12/30/2013 1458   CREATININE 0.46* 12/30/2013 1458   CREATININE 0.54 03/24/2013 1357   CALCIUM 9.0 12/30/2013 1458   PROT 8.7* 12/30/2013 1458   ALBUMIN 2.6* 12/30/2013 1458   AST 80* 12/30/2013 1458   ALT 34 12/30/2013 1458   ALKPHOS 123* 12/30/2013 1458   BILITOT 2.4* 12/30/2013 1458   GFRNONAA >90 12/30/2013 1458   GFRAA >90 12/30/2013 1458     Recent Labs Lab 12/30/13 1458  LIPASE 56   No results found for this basename: AMMONIA,  in the last 168 hours  No results found for this basename: CKTOTAL, CKMB, CKMBINDEX, TROPONINI,  in the last 168 hours BNP (last 3 results) No results found for this basename: PROBNP,  in the last 8760 hours  Radiological Exams on Admission: Ct Cta Abd/pel W/cm &/or W/o Cm  12/30/2013   CLINICAL DATA:  Upper abdominal pain. Abdominal distention. Rectal bleeding.  EXAM: CTA ABDOMEN AND PELVIS wITHOUT AND WITH CONTRAST  TECHNIQUE: Multidetector CT imaging of the abdomen and pelvis was performed using the standard protocol during bolus administration of intravenous contrast. Multiplanar reconstructed images and MIPs  were obtained and reviewed to evaluate the vascular anatomy.  CONTRAST:  OMNIPAQUE IOHEXOL 350 MG/ML SOLN  COMPARISON:  03/24/2013  FINDINGS: Bibasilar atelectasis.  Heart is mildly enlarged.  No effusions.  Changes of severe cirrhosis with nodular liver, enlargement of the left hepatic lobe atrophy of the right hepatic lobe. Moderate to large volume ascites throughout the abdomen and pelvis.  Prior cholecystectomy. Prior splenectomy. Pancreas, adrenals and kidneys are unremarkable. Uterus, adnexae and urinary bladder grossly unremarkable.  Evaluation of the large and small bowel somewhat limited without intravenous contrast and with the ascites. No definite focal abnormality. Mildly prominent mesenteric lymph nodes centrally. These are presumably related to congestion or reactive. Aorta is normal caliber. Incidentally noted is a retro aortic left renal vein.  Review of the MIP images confirms the above findings.  IMPRESSION: Severe cirrhosis with moderate to large volume ascites.  Prior cholecystectomy and splenectomy.  Bibasilar atelectasis.   Electronically Signed   By: Charlett Nose M.D.   On: 12/30/2013 20:24     Assessment/Plan Principal Problem:   Lower GI bleed Active Problems:   HYPERTENSION   CROHN'S DISEASE-LARGE INTESTINE   Celiac disease   Aortic stenosis   Gastric ulcer with hemorrhage   Ascites   Cirrhosis of liver   S/P splenectomy   Diabetes mellitus type 2, noninsulin dependent   1. Lower GI bleed Patient presents with complaints of bright red blood per rectum. Colonoscopy 2 years ago was unremarkable. Gastroenterology was considered by ED recommend conservative management and monitoring for the patient. At present the patient will be admitted to telemetry we will follow serial CBC. Continue her home medications but hold metformin as a CT scan was performed with contrast. Carley Hammed Protonix every 12 hours and ceftriaxone first SBP prophylaxis due to her ascites and cirrhosis  of liver. Continue lialda  2. Diabetes mellitus Placing the patient on sliding scale.  3. Cirrhosis of liver Ascites At present does not appear to be significantly distended. At present we  will place her on SBP prophylaxis with ceftriaxone, continue rifaximin, continue lactulose.  Consults: Gastroenterology  DVT Prophylaxis: mechanical compression device Nutrition: Clear liquid diet  Code Status: Full  Family Communication:  family was present at bedside, opportunity was given to ask question and all questions were answered satisfactorily at the time of interview. Disposition: Admitted to observation in telemetry unit.  Author: Lynden Oxford, MD [facility] Pager: [phone] 12/31/2013, 6:16 AM    If 7PM-7AM, please contact night-coverage [email or web address] Password [email or web address]  **Disclaimer: This note may have been dictated with voice recognition software. Similar sounding words can inadvertently be transcribed and this note may contain transcription errors which may not have been corrected upon publication of note.**

## 2014-01-03 ENCOUNTER — Telehealth: Payer: Self-pay

## 2014-01-03 NOTE — Telephone Encounter (Signed)
Called and left a message for call back  

## 2014-01-04 ENCOUNTER — Telehealth: Payer: Self-pay | Admitting: Internal Medicine

## 2014-01-04 NOTE — Telephone Encounter (Signed)
Pt states Dr. Marina Goodell started her on Aldactone over the weekend but she has not noticed a difference in her abdomen. States she is still quite swollen and she wondered if it should have gone down from the Aldactone. Pt also wanted to know if she needs a sooner appt than 02/02/14 since she was in the ER. Please advise.

## 2014-01-04 NOTE — Telephone Encounter (Signed)
Admitted:  12/30/2013 Discharged:  12/31/2013  Pt is home.  Doing better.  Denies bleeding from rectum.  Some nausea, but no vomiting.  Has abdominal swelling (ascites---+hepatic encephalopathy and cirrohosis). Taking lasix, spironolactone, and lactulose as prescribed.  Went to [facility] yesterday to have a discussion with provider regarding liver transplant.  Will need a tb skin test with next appointment.  She's eating well. Drinking Ensure between meals.  Still independent of ADLs.    Medication and allergies:  Reviewed and updated Medication changes:  D/C Ativan and ASA; New medications:  Janumet and Spironolactone  Local pharmacy: [id number] - [place], [place] - [address]  No changes to personal, family history or past surgical hx   Follow up appointment with Dr. Marina Goodell scheduled 02/02/14 at 10:45 am  Hospital follow appointment with Dr. Beverely Low scheduled 01/10/14 at 11:30 am.  Recommendations for Outpatient Follow-up: (written on Discharge Summary) 1. Please check CBC/BMET next visit

## 2014-01-04 NOTE — Telephone Encounter (Signed)
Have her increase Aldactone from 100 mg daily to 200 mg daily. Also, increase furosemide from 20 mg daily to 40 mg daily. Have her diet with no added salt. Also, measure daily weights. Have her give us an update on Monday. Also, make sure she saw Dr. Julieta GuttingHayashi

## 2014-01-04 NOTE — Telephone Encounter (Signed)
Spoke with pt and she is aware.

## 2014-01-04 NOTE — Telephone Encounter (Signed)
Pt did see Dr. Julieta Gutting and really liked him. States she is supposed to go back in a month, had some labs drawn and they came up with a score to give to the transplant team. Do you want to see her prior to 02/02/14?

## 2014-01-04 NOTE — Telephone Encounter (Signed)
Okay for now. Just get update next week and we will go from there. Thanks

## 2014-01-09 ENCOUNTER — Telehealth: Payer: Self-pay | Admitting: Internal Medicine

## 2014-01-09 ENCOUNTER — Ambulatory Visit: Payer: BC Managed Care – PPO | Admitting: Family Medicine

## 2014-01-09 ENCOUNTER — Other Ambulatory Visit: Payer: Self-pay

## 2014-01-09 DIAGNOSIS — R188 Other ascites: Secondary | ICD-10-CM

## 2014-01-09 NOTE — Telephone Encounter (Signed)
Send her for abdominal ultrasound with paracentesis removing up to 5 L. Send the fluid for cell count with differential. Albumin replacement per protocol.

## 2014-01-09 NOTE — Telephone Encounter (Signed)
Pt scheduled for US guided paracentesis at [facility] 01/12/14@11am . Pt to arrive there at 10:45am. Pt to have no more than 5 liters of fluid removed and received Albumin IV 8gm for every liter of fluid drawn off. Fluid to be sent off for cell count and diff. Pt aware of appt date and time.

## 2014-01-09 NOTE — Telephone Encounter (Signed)
Pt calling with an update. States she still feels quite full and that her abdomen feels "stretched." Her wt is down from 150lb to 136lb. She is currently taking aldactone 200mg /day and lasix 40mg /day. Please advise.

## 2014-01-10 ENCOUNTER — Encounter: Payer: Self-pay | Admitting: Family Medicine

## 2014-01-10 ENCOUNTER — Ambulatory Visit (INDEPENDENT_AMBULATORY_CARE_PROVIDER_SITE_OTHER): Payer: BC Managed Care – PPO | Admitting: Family Medicine

## 2014-01-10 ENCOUNTER — Telehealth: Payer: Self-pay | Admitting: Internal Medicine

## 2014-01-10 VITALS — BP 122/72 | HR 72 | Temp 97.8°F | Resp 16 | Wt 135.0 lb

## 2014-01-10 DIAGNOSIS — Z23 Encounter for immunization: Secondary | ICD-10-CM

## 2014-01-10 DIAGNOSIS — E119 Type 2 diabetes mellitus without complications: Secondary | ICD-10-CM

## 2014-01-10 DIAGNOSIS — K7460 Unspecified cirrhosis of liver: Secondary | ICD-10-CM

## 2014-01-10 DIAGNOSIS — K7469 Other cirrhosis of liver: Secondary | ICD-10-CM

## 2014-01-10 LAB — CBC WITH DIFFERENTIAL/PLATELET
BASOS ABS: 0 10*3/uL (ref 0.0–0.1)
Basophils Relative: 0.2 % (ref 0.0–3.0)
Eosinophils Absolute: 0.2 10*3/uL (ref 0.0–0.7)
Eosinophils Relative: 1.7 % (ref 0.0–5.0)
HCT: 40.8 % (ref 36.0–46.0)
Hemoglobin: 13.4 g/dL (ref 12.0–15.0)
LYMPHS ABS: 2.1 10*3/uL (ref 0.7–4.0)
LYMPHS PCT: 21 % (ref 12.0–46.0)
MCHC: 32.8 g/dL (ref 30.0–36.0)
MCV: 103.7 fl — ABNORMAL HIGH (ref 78.0–100.0)
MONOS PCT: 8.7 % (ref 3.0–12.0)
Monocytes Absolute: 0.9 10*3/uL (ref 0.1–1.0)
NEUTROS PCT: 68.4 % (ref 43.0–77.0)
Neutro Abs: 6.9 10*3/uL (ref 1.4–7.7)
PLATELETS: 192 10*3/uL (ref 150.0–400.0)
RBC: 3.93 Mil/uL (ref 3.87–5.11)
RDW: 18.5 % — AB (ref 11.5–15.5)
WBC: 10.1 10*3/uL (ref 4.0–10.5)

## 2014-01-10 LAB — BASIC METABOLIC PANEL
BUN: 8 mg/dL (ref 6–23)
CALCIUM: 9.4 mg/dL (ref 8.4–10.5)
CO2: 24 mEq/L (ref 19–32)
CREATININE: 0.6 mg/dL (ref 0.4–1.2)
Chloride: 101 mEq/L (ref 96–112)
GFR: 115.17 mL/min (ref >60.00)
Glucose, Bld: 214 mg/dL — ABNORMAL HIGH (ref 70–99)
Potassium: 4.4 mEq/L (ref 3.5–5.1)
Sodium: 131 mEq/L — ABNORMAL LOW (ref 135–145)

## 2014-01-10 LAB — AMMONIA: AMMONIA: 63 umol/L — AB (ref 16–53)

## 2014-01-10 MED ORDER — SITAGLIP PHOS-METFORMIN HCL ER 100-1000 MG PO TB24: 1.0000 | ORAL_TABLET | Freq: Every day | ORAL | Status: DC

## 2014-01-10 NOTE — Telephone Encounter (Signed)
Pt called and cancelled her paracentesis that was scheduled for tomorrow. Pt states that all the fluid that was bothering her in her abdomen is gone, wt is now 134. Dr. Marina Goodell notified.

## 2014-01-10 NOTE — Assessment & Plan Note (Signed)
Chronic problem.  CBGs are much better controlled since starting Janumet.  Refill provided- 90 days at pt's request

## 2014-01-10 NOTE — Telephone Encounter (Signed)
Ok, thanks. Please touch base with her in one week to see how she's doing

## 2014-01-10 NOTE — Progress Notes (Signed)
   Subjective:    Patient ID: Sabrina [name], female    DOB: 27-May-1956, 58 y.o.   MRN: [id number]  HPI Hospital f/u- pt was admitted on 7/3 w/ BRBPR.  Had 3 episodes of hematochezia.  No longer having bleeding 'but I'm falling apart otherwise'.  Pt has paracentesis scheduled for Wednesday but pt isn't sure she needs this.  Last week reports abd was firm, distended, 'it looked like i swallowed a watermelon'.  Ascites has improved w/ increased diuretic dose- Aldactone 200 and Lasix 40.  Pt is considering liver transplant.  DM- chronic problem, fasting sugars are MUCH improved since starting Janumet, 77-121 are fasting.  Had 1 reading in the 50s.  Post-prandial #s are still fluctuating.   Review of Systems For ROS see HPI     Objective:   Physical Exam  Vitals reviewed. Constitutional: She is oriented to person, place, and time. She appears well-developed and well-nourished. No distress.  HENT:  Head: Normocephalic and atraumatic.  Cardiovascular: Normal rate, regular rhythm and intact distal pulses.   Murmur (III/VI SEM at RUSB) heard. Abdominal: Soft. Bowel sounds are normal. She exhibits no distension. There is no tenderness. There is no rebound.  Musculoskeletal: She exhibits no edema.  Neurological: She is alert and oriented to person, place, and time.  Skin: Skin is warm and dry. No rash noted. No erythema.  Psychiatric: She has a normal mood and affect. Her behavior is normal. Thought content normal.          Assessment & Plan:

## 2014-01-10 NOTE — Progress Notes (Signed)
Pre visit review using our clinic review tool, if applicable. No additional management support is needed unless otherwise documented below in the visit note. 

## 2014-01-10 NOTE — Assessment & Plan Note (Signed)
Chronic problem.  Hematochezia has resolved.  Was scheduled to have paracentesis tomorrow but w/ increased dose of diuretics, pt has no fluid to tap.  Had appt and [facility] to discuss liver transplantation- has list of requirements (labs, immunizations, etc) to be completed.  Will start on list and fax as results available

## 2014-01-10 NOTE — Patient Instructions (Signed)
Follow up as scheduled Cancel your paracentesis for tomorrow Schedule a nurse visit after 7/22 for your Prevnar and TB test (not a Thursday)- we'll print your vaccine records at this visit Dr Lamar SprinklesPerry's office can send your endoscopy/colonoscopy notes You are WAY overdue for a pap You are due for a mammo and can schedule at your convenience Call with any questions or concerns HANG IN THERE!!!

## 2014-01-11 ENCOUNTER — Encounter: Payer: Self-pay | Admitting: Family Medicine

## 2014-01-11 ENCOUNTER — Ambulatory Visit (HOSPITAL_COMMUNITY): Payer: BC Managed Care – PPO

## 2014-01-11 LAB — HEPATITIS A ANTIBODY, TOTAL: Hep A Total Ab: NONREACTIVE

## 2014-01-11 LAB — HEPATITIS B SURFACE ANTIBODY,QUALITATIVE: Hep B S Ab: POSITIVE — AB

## 2014-01-11 MED ORDER — ONDANSETRON HCL 4 MG PO TABS: 4.0000 mg | ORAL_TABLET | Freq: Three times a day (TID) | ORAL | Status: DC | PRN

## 2014-01-11 NOTE — Telephone Encounter (Signed)
Noted and reminder placed in epic. 

## 2014-01-12 ENCOUNTER — Ambulatory Visit (HOSPITAL_COMMUNITY): Payer: BC Managed Care – PPO

## 2014-01-17 ENCOUNTER — Telehealth: Payer: Self-pay

## 2014-01-17 ENCOUNTER — Other Ambulatory Visit: Payer: Self-pay

## 2014-01-17 ENCOUNTER — Telehealth: Payer: Self-pay | Admitting: Internal Medicine

## 2014-01-17 DIAGNOSIS — K7460 Unspecified cirrhosis of liver: Secondary | ICD-10-CM

## 2014-01-17 DIAGNOSIS — F4323 Adjustment disorder with mixed anxiety and depressed mood: Secondary | ICD-10-CM

## 2014-01-17 NOTE — Telephone Encounter (Signed)
Message copied by Chrystie Nose on Tue Jan 17, 2014  8:54 AM ------      Message from: HUNT, [name] R      Created: Wed Jan 11, 2014  8:32 AM       Call and check on pt ------

## 2014-01-17 NOTE — Telephone Encounter (Signed)
Spoke with pt and she is aware, order in epic. 

## 2014-01-17 NOTE — Telephone Encounter (Signed)
Pt states she has some papers that need to be filled out for FMLA/Long term disability. Pt has tried to get Dr. Julieta Gutting to do this but he is out of the country and will not return until next month and she needs these by the end of the month. Pt wants to know if Dr. Marina Goodell will fill these out for her. Please advise.

## 2014-01-17 NOTE — Telephone Encounter (Signed)
Called to check on pt and she states she is not feeling great. States she is having to take about 3 doses of lactulose/day and sometimes only has 1 BM/day. She states she takes one dose in the am, 2nd dose around 4pm, and if she has not gone to the bathroom a 3rd dose around 6pm. States she was not urinating much and [facility] and was told to increase her water intake to 1800cc/day. Saw her psychiatrist Friday and was started on Neurontin 100mg  BID in place of the ativan she had to come [facility]. Pt wanted to make sure Dr. Marina GoodellPerry is ok with this.

## 2014-01-17 NOTE — Telephone Encounter (Signed)
Yes [name]. If you don't mind, please fill out as much as possible. I can do the rest for her then sign of papers. Thanks

## 2014-01-17 NOTE — Telephone Encounter (Signed)
Pt aware.

## 2014-01-17 NOTE — Telephone Encounter (Signed)
Can increase amount of lactulose if needed. Needs repeat BMET this week. Thanks

## 2014-01-18 ENCOUNTER — Other Ambulatory Visit (INDEPENDENT_AMBULATORY_CARE_PROVIDER_SITE_OTHER): Payer: 59

## 2014-01-18 ENCOUNTER — Encounter: Payer: Self-pay | Admitting: Family Medicine

## 2014-01-18 DIAGNOSIS — K7460 Unspecified cirrhosis of liver: Secondary | ICD-10-CM

## 2014-01-18 LAB — BASIC METABOLIC PANEL
BUN: 11 mg/dL (ref 6–23)
CALCIUM: 9.4 mg/dL (ref 8.4–10.5)
CO2: 25 mEq/L (ref 19–32)
Chloride: 94 mEq/L — ABNORMAL LOW (ref 96–112)
Creatinine, Ser: 0.8 mg/dL (ref 0.4–1.2)
GFR: 73.62 mL/min (ref >60.00)
GLUCOSE: 276 mg/dL — AB (ref 70–99)
Potassium: 4.5 mEq/L (ref 3.5–5.1)
SODIUM: 127 meq/L — AB (ref 135–145)

## 2014-01-19 ENCOUNTER — Other Ambulatory Visit: Payer: Self-pay

## 2014-01-19 DIAGNOSIS — K7460 Unspecified cirrhosis of liver: Secondary | ICD-10-CM

## 2014-01-23 ENCOUNTER — Telehealth: Payer: Self-pay

## 2014-01-23 NOTE — Telephone Encounter (Signed)
Message copied by Chrystie Nose on Mon Jan 23, 2014  2:26 PM ------      Message from: HUNT, [place] R      Created: Thu Jan 19, 2014  9:11 AM      Regarding: BMET       Pt needs a BMET on Tuesday, order in. ------

## 2014-01-23 NOTE — Telephone Encounter (Signed)
Pt aware.

## 2014-01-24 ENCOUNTER — Ambulatory Visit: Payer: 59

## 2014-01-24 ENCOUNTER — Telehealth: Payer: Self-pay | Admitting: *Deleted

## 2014-01-24 ENCOUNTER — Other Ambulatory Visit: Payer: Self-pay

## 2014-01-24 ENCOUNTER — Other Ambulatory Visit (INDEPENDENT_AMBULATORY_CARE_PROVIDER_SITE_OTHER): Payer: 59

## 2014-01-24 ENCOUNTER — Telehealth: Payer: Self-pay | Admitting: Internal Medicine

## 2014-01-24 DIAGNOSIS — E871 Hypo-osmolality and hyponatremia: Secondary | ICD-10-CM

## 2014-01-24 DIAGNOSIS — Z23 Encounter for immunization: Secondary | ICD-10-CM

## 2014-01-24 DIAGNOSIS — Z111 Encounter for screening for respiratory tuberculosis: Secondary | ICD-10-CM

## 2014-01-24 DIAGNOSIS — K7460 Unspecified cirrhosis of liver: Secondary | ICD-10-CM

## 2014-01-24 LAB — BASIC METABOLIC PANEL
BUN: 10 mg/dL (ref 6–23)
CHLORIDE: 100 meq/L (ref 96–112)
CO2: 24 mEq/L (ref 19–32)
CREATININE: 0.8 mg/dL (ref 0.4–1.2)
Calcium: 9.7 mg/dL (ref 8.4–10.5)
GFR: 74.64 mL/min (ref >60.00)
Glucose, Bld: 278 mg/dL — ABNORMAL HIGH (ref 70–99)
Potassium: 4.8 mEq/L (ref 3.5–5.1)
SODIUM: 129 meq/L — AB (ref 135–145)

## 2014-01-24 NOTE — Telephone Encounter (Signed)
Received Diabetes Supply Order form via from [facility].  Placed in folder for Dr. Beverely Low to complete and sign.//AB/CMA

## 2014-01-24 NOTE — Telephone Encounter (Signed)
PPD given 01/24/14 . Pt to return Thursday to have results read.

## 2014-01-24 NOTE — Telephone Encounter (Signed)
Left message for pt to call back.  Pt aware and requests note be faxed to Dr. Evelene Croon [phone].

## 2014-01-24 NOTE — Telephone Encounter (Signed)
She just has to watch for drowsiness. Also, she should check with her pharmacist to make sure there aren't any significant drug drug interactions. Otherwise, okay

## 2014-01-24 NOTE — Telephone Encounter (Signed)
Dr. Marina GoodellPerry would Vistaril be ok for pt to take, her psychiatrist wanted to place her on it. Please advise.

## 2014-01-26 ENCOUNTER — Encounter: Payer: Self-pay | Admitting: General Practice

## 2014-01-26 ENCOUNTER — Other Ambulatory Visit (INDEPENDENT_AMBULATORY_CARE_PROVIDER_SITE_OTHER): Payer: 59

## 2014-01-26 ENCOUNTER — Telehealth: Payer: Self-pay | Admitting: Internal Medicine

## 2014-01-26 ENCOUNTER — Ambulatory Visit (INDEPENDENT_AMBULATORY_CARE_PROVIDER_SITE_OTHER): Payer: 59 | Admitting: Nurse Practitioner

## 2014-01-26 ENCOUNTER — Encounter: Payer: Self-pay | Admitting: Nurse Practitioner

## 2014-01-26 VITALS — BP 112/62 | HR 88 | Temp 98.6°F | Ht 64.0 in | Wt 131.8 lb

## 2014-01-26 DIAGNOSIS — R109 Unspecified abdominal pain: Secondary | ICD-10-CM

## 2014-01-26 DIAGNOSIS — K5010 Crohn's disease of large intestine without complications: Secondary | ICD-10-CM

## 2014-01-26 DIAGNOSIS — K7460 Unspecified cirrhosis of liver: Secondary | ICD-10-CM

## 2014-01-26 DIAGNOSIS — K7469 Other cirrhosis of liver: Secondary | ICD-10-CM

## 2014-01-26 LAB — URINALYSIS, ROUTINE W REFLEX MICROSCOPIC
HGB URINE DIPSTICK: NEGATIVE
Nitrite: POSITIVE — AB
Specific Gravity, Urine: 1.025 (ref 1.000–1.030)
URINE GLUCOSE: NEGATIVE
UROBILINOGEN UA: 1 (ref 0.0–1.0)
pH: 5 (ref 5.0–8.0)

## 2014-01-26 LAB — COMPREHENSIVE METABOLIC PANEL
ALBUMIN: 2.7 g/dL — AB (ref 3.5–5.2)
ALT: 49 U/L — ABNORMAL HIGH (ref 0–35)
AST: 101 U/L — AB (ref 0–37)
Alkaline Phosphatase: 99 U/L (ref 39–117)
BUN: 13 mg/dL (ref 6–23)
CALCIUM: 9.4 mg/dL (ref 8.4–10.5)
CHLORIDE: 98 meq/L (ref 96–112)
CO2: 21 mEq/L (ref 19–32)
Creatinine, Ser: 0.9 mg/dL (ref 0.4–1.2)
GFR: 68.86 mL/min (ref >60.00)
Glucose, Bld: 202 mg/dL — ABNORMAL HIGH (ref 70–99)
POTASSIUM: 4.7 meq/L (ref 3.5–5.1)
Sodium: 129 mEq/L — ABNORMAL LOW (ref 135–145)
Total Bilirubin: 3.2 mg/dL — ABNORMAL HIGH (ref 0.2–1.2)
Total Protein: 8.8 g/dL — ABNORMAL HIGH (ref 6.0–8.3)

## 2014-01-26 LAB — CBC WITH DIFFERENTIAL/PLATELET
Basophils Absolute: 0 10*3/uL (ref 0.0–0.1)
Basophils Relative: 0.2 % (ref 0.0–3.0)
EOS ABS: 0.1 10*3/uL (ref 0.0–0.7)
Eosinophils Relative: 0.8 % (ref 0.0–5.0)
HCT: 38.3 % (ref 36.0–46.0)
Hemoglobin: 13 g/dL (ref 12.0–15.0)
LYMPHS PCT: 20.1 % (ref 12.0–46.0)
Lymphs Abs: 2.3 10*3/uL (ref 0.7–4.0)
MCHC: 34 g/dL (ref 30.0–36.0)
MCV: 102.7 fl — ABNORMAL HIGH (ref 78.0–100.0)
Monocytes Absolute: 1.1 10*3/uL — ABNORMAL HIGH (ref 0.1–1.0)
Monocytes Relative: 9.6 % (ref 3.0–12.0)
NEUTROS PCT: 69.3 % (ref 43.0–77.0)
Neutro Abs: 7.9 10*3/uL — ABNORMAL HIGH (ref 1.4–7.7)
Platelets: 191 10*3/uL (ref 150.0–400.0)
RBC: 3.73 Mil/uL — AB (ref 3.87–5.11)
RDW: 16.2 % — ABNORMAL HIGH (ref 11.5–15.5)
WBC: 11.4 10*3/uL — AB (ref 4.0–10.5)

## 2014-01-26 LAB — TB SKIN TEST
Induration: 0 mm
TB SKIN TEST: NEGATIVE

## 2014-01-26 NOTE — Telephone Encounter (Signed)
Very difficult to assess over the phone.  She can take hyomax 0.375 mg twice a day as needed for pain.  If pain does not improve with medicine she needs to be seen for office evaluation

## 2014-01-26 NOTE — Telephone Encounter (Signed)
Pt called back and would like to be seen today. Pt scheduled to see Willette Cluster NP today at 1:30pm. Pt aware of appt.

## 2014-01-26 NOTE — Telephone Encounter (Signed)
Sabrina [name] pt with h/o cirrhosis. Is also being [facility]. Pt states that last evening she started having pain in her lower abdomen below her belly button that goes around to her back. States it is a constant stabbing pain. Pt states her abdomen is a liitle distended. Also states she is having to take about 4 doses of lactulose to have about 3 BM's. Dr. Arlyce DiceKaplan as doc of the day please advise.

## 2014-01-26 NOTE — Telephone Encounter (Signed)
Pt showed up in the office and stated that if we could not see her today [facility]. Pt left the office. Called pt and let her know that there were 2 appts added on later this afternoon if she wanted to be seen here this afternoon. Left message for pt to call back as soon as she got this message if she wanted to be seen today.

## 2014-01-26 NOTE — Progress Notes (Signed)
History of Present Illness:   Sabrina [name] is a 58 y.o. female with multiple, significant medical problems. She has been followed here for Crohn's colitis, celiac disease, PUD and most recently cryptogenic cirrhosis. She is s/p splenectomy and cholecystectomy. Patient presents with a two day history of diffuse abdominal pain. Pain unrelated to meals or defecation. Bowels moving adequately. Pain started in lower abdomen, now spreading to upper abdomen. No urinary symptoms. No fevers. She did have a hepatitis A and pneumococcal 13 vaccine yesterday.   Current Medications, Allergies, Past Medical History, Past Surgical History, Family History and Social History were reviewed in [employer] record.  Studies:   Ct Cta Abd/pel W/cm &/or W/o Cm  12/30/2013   CLINICAL DATA:  Upper abdominal pain. Abdominal distention. Rectal bleeding.  EXAM: CTA ABDOMEN AND PELVIS wITHOUT AND WITH CONTRAST  TECHNIQUE: Multidetector CT imaging of the abdomen and pelvis was performed using the standard protocol during bolus administration of intravenous contrast. Multiplanar reconstructed images and MIPs were obtained and reviewed to evaluate the vascular anatomy.  CONTRAST:  OMNIPAQUE IOHEXOL 350 MG/ML SOLN  COMPARISON:  03/24/2013  FINDINGS: Bibasilar atelectasis.  Heart is mildly enlarged.  No effusions.  Changes of severe cirrhosis with nodular liver, enlargement of the left hepatic lobe atrophy of the right hepatic lobe. Moderate to large volume ascites throughout the abdomen and pelvis.  Prior cholecystectomy. Prior splenectomy. Pancreas, adrenals and kidneys are unremarkable. Uterus, adnexae and urinary bladder grossly unremarkable.  Evaluation of the large and small bowel somewhat limited without intravenous contrast and with the ascites. No definite focal abnormality. Mildly prominent mesenteric lymph nodes centrally. These are presumably related to congestion or reactive. Aorta is  normal caliber. Incidentally noted is a retro aortic left renal vein.  Review of the MIP images confirms the above findings.  IMPRESSION: Severe cirrhosis with moderate to large volume ascites.  Prior cholecystectomy and splenectomy.  Bibasilar atelectasis.   Electronically Signed   By: Charlett Nose M.D.   On: 12/30/2013 20:24   Physical Exam: General: Pleasant, well developed , white female in no acute distress Head: Normocephalic and atraumatic Eyes:  sclerae anicteric, conjunctiva pink  Ears: Normal auditory acuity Lungs: Clear throughout to auscultation Heart: Regular rate and rhythm Abdomen: Soft, nondistended. Moderate bilateral lower quadrant tenderness, moderate epigastric tenderness.  No masses,  Normal bowel sounds Musculoskeletal: Symmetrical with no gross deformities  Extremities: No edema  Neurological: Alert oriented x 4, grossly nonfocal Psychological:  Alert and cooperative. Normal mood and affect  Assessment and Recommendations:  #1. Complicated 58 year old female with significant GI history  Including cryptogenic cirrhosis, mild Crohn's colitis, celiac disease hx of PUD. Patient presents today with a two-day history of significant, diffuse abdominal pain. Pain initially in lower abdomen, now moving to the top of the abdomen. She is not constipated. No nausea / vomiting. Denies fever. No urinary symptoms. Cause of pain unclear.No known history of diverticular disease based on colonoscopy and CT scans and pain so diffuse that diverticulitis is unlikely. Doubt doubt SBP as I don't appreciate much, if any, ascites on exam. Will check basic labs. I will send her for an ultrasound to look for ascites and if present then a diagnostic tap. Will check a urinalysis. I will touch base with patient tomorrow regarding lab results and further recommendations.   #2. Mild Crohn's colitis. Most recent colonoscopy Janauary 2014 was normal. Maintained on Mesalamine. BMs at baseline, no blood in  stool

## 2014-01-26 NOTE — Patient Instructions (Addendum)
Your physician has requested that you go to the basement for the following lab work before leaving today: Urinalysis, CBC, CMET  You have been scheduled for an abdominal ultrasound at [facility] (1st floor of hospital) on 01/27/14 at 8:30 am. Please arrive 15 minutes prior to your appointment for registration. Make certain not to have anything to eat or drink 6 hours prior to your appointment. Should you need to reschedule your appointment, please contact radiology at [phone].   CC:Dr Beverely Low

## 2014-01-27 ENCOUNTER — Ambulatory Visit: Payer: 59

## 2014-01-27 ENCOUNTER — Encounter: Payer: Self-pay | Admitting: Nurse Practitioner

## 2014-01-27 ENCOUNTER — Ambulatory Visit (HOSPITAL_COMMUNITY)
Admission: RE | Admit: 2014-01-27 | Discharge: 2014-01-27 | Disposition: A | Discharge status: Home / Self Care | Payer: 59 | Source: Ambulatory Visit | Attending: Nurse Practitioner | Admitting: Nurse Practitioner

## 2014-01-27 DIAGNOSIS — K5010 Crohn's disease of large intestine without complications: Secondary | ICD-10-CM | POA: Insufficient documentation

## 2014-01-27 DIAGNOSIS — R109 Unspecified abdominal pain: Secondary | ICD-10-CM | POA: Insufficient documentation

## 2014-01-27 DIAGNOSIS — K7460 Unspecified cirrhosis of liver: Secondary | ICD-10-CM | POA: Diagnosis not present

## 2014-01-27 NOTE — Progress Notes (Signed)
Patient ID: Sabrina [name], female   DOB: Nov 16, 1955, 58 y.o.   MRN: [id number] Only trace amount of ascites noted on today's US abdomen (primarily suprapubic), not safely accessible for paracentesis. GI service informed of above.

## 2014-01-27 NOTE — Progress Notes (Signed)
Agree with initial assessment and plans. Does she need repeat CT? Is she taking PPI (hx PUD)? Thanks

## 2014-01-30 ENCOUNTER — Telehealth: Payer: Self-pay | Admitting: Nurse Practitioner

## 2014-01-30 ENCOUNTER — Telehealth: Payer: Self-pay

## 2014-01-30 ENCOUNTER — Other Ambulatory Visit: Payer: Self-pay

## 2014-01-30 ENCOUNTER — Other Ambulatory Visit (INDEPENDENT_AMBULATORY_CARE_PROVIDER_SITE_OTHER): Payer: 59

## 2014-01-30 DIAGNOSIS — K7469 Other cirrhosis of liver: Secondary | ICD-10-CM

## 2014-01-30 DIAGNOSIS — E871 Hypo-osmolality and hyponatremia: Secondary | ICD-10-CM

## 2014-01-30 LAB — BASIC METABOLIC PANEL
BUN: 7 mg/dL (ref 6–23)
CO2: 25 meq/L (ref 19–32)
Calcium: 8.8 mg/dL (ref 8.4–10.5)
Chloride: 103 mEq/L (ref 96–112)
Creatinine, Ser: 0.7 mg/dL (ref 0.4–1.2)
GFR: 98.96 mL/min (ref >60.00)
Glucose, Bld: 191 mg/dL — ABNORMAL HIGH (ref 70–99)
Potassium: 4.5 mEq/L (ref 3.5–5.1)
Sodium: 129 mEq/L — ABNORMAL LOW (ref 135–145)

## 2014-01-30 LAB — URINE CULTURE: Colony Count: >=100000

## 2014-01-30 NOTE — Telephone Encounter (Signed)
Message copied by Chrystie Nose on Mon Jan 30, 2014  8:59 AM ------      Message from: HUNT, [place] R      Created: Tue Jan 24, 2014  2:56 PM      Regarding: BMET       Needs BMET in 1 week, order in. ------

## 2014-01-30 NOTE — Telephone Encounter (Signed)
Pt aware.

## 2014-01-30 NOTE — Telephone Encounter (Signed)
Spoke with patient and went over results again from 01/26/14.

## 2014-01-31 ENCOUNTER — Telehealth: Payer: Self-pay

## 2014-01-31 ENCOUNTER — Ambulatory Visit (HOSPITAL_COMMUNITY): Payer: 59

## 2014-01-31 MED ORDER — CIPROFLOXACIN HCL 500 MG PO TABS: 500.0000 mg | ORAL_TABLET | Freq: Two times a day (BID) | ORAL | Status: DC

## 2014-01-31 NOTE — Telephone Encounter (Signed)
Called patient and discussed urine culture results with her.  Made her aware of new prescription.  She agreed with plan. [facility] [address]. Pt is aware.

## 2014-01-31 NOTE — Telephone Encounter (Signed)
cipro 500 mg 1 po bid for 5 days  

## 2014-01-31 NOTE — Telephone Encounter (Signed)
Received fax from Meredith Pel, NP from Gastroenterology regarding urine culture result.  Please see Urine Culture result and note on 01/27/14 and please advise.

## 2014-02-01 ENCOUNTER — Telehealth: Payer: Self-pay | Admitting: *Deleted

## 2014-02-01 NOTE — Telephone Encounter (Signed)
I called [facility] and did a Prior auth for Xifaxan 550 mg twice daily.  Got an approval with # [id number] good until 02-02-2015.  I called the patient to advise I am calling the [facility] pharmacy, [address] to advise of this approval.  The patient said they have her Xifaxan copay savings card on file she had given them. I called them to advise of this approval for the patient.

## 2014-02-01 NOTE — Telephone Encounter (Signed)
I was told the patient will pay $10.00 copay after he ran the numbers on the copay card we gave her.

## 2014-02-02 ENCOUNTER — Other Ambulatory Visit (INDEPENDENT_AMBULATORY_CARE_PROVIDER_SITE_OTHER): Payer: 59

## 2014-02-02 ENCOUNTER — Ambulatory Visit (INDEPENDENT_AMBULATORY_CARE_PROVIDER_SITE_OTHER)
Admission: RE | Admit: 2014-02-02 | Discharge: 2014-02-02 | Disposition: A | Discharge status: Home / Self Care | Payer: 59 | Source: Ambulatory Visit | Attending: Internal Medicine | Admitting: Internal Medicine

## 2014-02-02 ENCOUNTER — Telehealth: Payer: Self-pay

## 2014-02-02 ENCOUNTER — Encounter: Payer: Self-pay | Admitting: Internal Medicine

## 2014-02-02 ENCOUNTER — Ambulatory Visit (INDEPENDENT_AMBULATORY_CARE_PROVIDER_SITE_OTHER): Payer: 59 | Admitting: Internal Medicine

## 2014-02-02 VITALS — BP 120/70 | HR 68 | Ht 64.0 in | Wt 136.4 lb

## 2014-02-02 DIAGNOSIS — K7682 Hepatic encephalopathy: Secondary | ICD-10-CM

## 2014-02-02 DIAGNOSIS — K7460 Unspecified cirrhosis of liver: Secondary | ICD-10-CM

## 2014-02-02 DIAGNOSIS — R109 Unspecified abdominal pain: Secondary | ICD-10-CM

## 2014-02-02 DIAGNOSIS — K7469 Other cirrhosis of liver: Secondary | ICD-10-CM

## 2014-02-02 DIAGNOSIS — K7290 Hepatic failure, unspecified without coma: Secondary | ICD-10-CM

## 2014-02-02 DIAGNOSIS — E871 Hypo-osmolality and hyponatremia: Secondary | ICD-10-CM

## 2014-02-02 LAB — CBC WITH DIFFERENTIAL/PLATELET
BASOS ABS: 0 10*3/uL (ref 0.0–0.1)
Basophils Relative: 0.1 % (ref 0.0–3.0)
EOS ABS: 0 10*3/uL (ref 0.0–0.7)
Eosinophils Relative: 0.1 % (ref 0.0–5.0)
HEMATOCRIT: 37.2 % (ref 36.0–46.0)
HEMOGLOBIN: 12.4 g/dL (ref 12.0–15.0)
LYMPHS ABS: 0.6 10*3/uL — AB (ref 0.7–4.0)
Lymphocytes Relative: 5.2 % — ABNORMAL LOW (ref 12.0–46.0)
MCHC: 33.2 g/dL (ref 30.0–36.0)
MCV: 103.6 fl — ABNORMAL HIGH (ref 78.0–100.0)
Monocytes Absolute: 0.6 10*3/uL (ref 0.1–1.0)
Monocytes Relative: 5.3 % (ref 3.0–12.0)
NEUTROS ABS: 9.8 10*3/uL — AB (ref 1.4–7.7)
Neutrophils Relative %: 89.3 % — ABNORMAL HIGH (ref 43.0–77.0)
Platelets: 145 10*3/uL — ABNORMAL LOW (ref 150.0–400.0)
RBC: 3.59 Mil/uL — ABNORMAL LOW (ref 3.87–5.11)
RDW: 15.8 % — AB (ref 11.5–15.5)
WBC: 10.9 10*3/uL — ABNORMAL HIGH (ref 4.0–10.5)

## 2014-02-02 LAB — BASIC METABOLIC PANEL
BUN: 9 mg/dL (ref 6–23)
CHLORIDE: 103 meq/L (ref 96–112)
CO2: 22 mEq/L (ref 19–32)
Calcium: 9.3 mg/dL (ref 8.4–10.5)
Creatinine, Ser: 0.8 mg/dL (ref 0.4–1.2)
GFR: 76.76 mL/min (ref >60.00)
Glucose, Bld: 204 mg/dL — ABNORMAL HIGH (ref 70–99)
POTASSIUM: 4.4 meq/L (ref 3.5–5.1)
Sodium: 131 mEq/L — ABNORMAL LOW (ref 135–145)

## 2014-02-02 LAB — COMPREHENSIVE METABOLIC PANEL
ALT: 45 U/L — AB (ref 0–35)
AST: 93 U/L — AB (ref 0–37)
Albumin: 2.4 g/dL — ABNORMAL LOW (ref 3.5–5.2)
Alkaline Phosphatase: 92 U/L (ref 39–117)
BILIRUBIN TOTAL: 3.8 mg/dL — AB (ref 0.2–1.2)
BUN: 9 mg/dL (ref 6–23)
CO2: 22 mEq/L (ref 19–32)
CREATININE: 0.8 mg/dL (ref 0.4–1.2)
Calcium: 9.3 mg/dL (ref 8.4–10.5)
Chloride: 103 mEq/L (ref 96–112)
GFR: 76.76 mL/min (ref >60.00)
Glucose, Bld: 204 mg/dL — ABNORMAL HIGH (ref 70–99)
Potassium: 4.4 mEq/L (ref 3.5–5.1)
Sodium: 131 mEq/L — ABNORMAL LOW (ref 135–145)
Total Protein: 8.1 g/dL (ref 6.0–8.3)

## 2014-02-02 LAB — AMYLASE: Amylase: 76 U/L (ref 27–131)

## 2014-02-02 LAB — LIPASE: Lipase: 37 U/L (ref 11.0–59.0)

## 2014-02-02 MED ORDER — IOHEXOL 300 MG/ML  SOLN
100.0000 mL | Freq: Once | INTRAMUSCULAR | Status: AC | PRN
  Administered 2014-02-02: 100 mL via INTRAVENOUS

## 2014-02-02 NOTE — Telephone Encounter (Signed)
Spoke with pt and she is aware.

## 2014-02-02 NOTE — Patient Instructions (Signed)
Your physician has requested that you go to the basement for the following lab work before leaving today: CBC, CMET, Amylase, Lipase  You have been scheduled for a CT scan of the abdomen and pelvis at [facility] ([address]---this is in the same building as [facility]).   You are scheduled on 02/02/2014 at 3:00pm. You should arrive 15 minutes prior to your appointment time for registration. Please follow the written instructions below on the day of your exam:  WARNING: IF YOU ARE ALLERGIC TO IODINE/X-RAY DYE, PLEASE NOTIFY RADIOLOGY IMMEDIATELY AT [phone]! YOU WILL BE GIVEN A 13 HOUR PREMEDICATION PREP.  1) Do not eat or drink anything after 11:00am (4 hours prior to your test) 2) You have been given 2 bottles of oral contrast to drink. The solution may taste better if refrigerated, but do NOT add ice or any other liquid to this solution. Shake well before drinking.    Drink 1 bottle of contrast @ 1:00pm (2 hours prior to your exam)  Drink 1 bottle of contrast @ 2:00pm (1 hour prior to your exam)  You may take any medications as prescribed with a small amount of water except for the following: Metformin, Glucophage, Glucovance, Avandamet, Riomet, Fortamet, Actoplus Met, Janumet, Glumetza or Metaglip. The above medications must be held the day of the exam AND 48 hours after the exam.  The purpose of you drinking the oral contrast is to aid in the visualization of your intestinal tract. The contrast solution may cause some diarrhea. Before your exam is started, you will be given a small amount of fluid to drink. Depending on your individual set of symptoms, you may also receive an intravenous injection of x-ray contrast/dye. Plan on being at [facility] for 30 minutes or long, depending on the type of exam you are having performed.  If you have any questions regarding your exam or if you need to reschedule, you may call the CT department at [phone] between  the hours of 8:00 am and 5:00 pm, Monday-Friday.   Please follow up with Dr. Henrene Pastor on 03/20/2014 at 2:15pm  ________________________________________________________________________

## 2014-02-02 NOTE — Telephone Encounter (Signed)
Pt forgot to ask Dr. Marina Goodell if she still needs to be limiting her fluid intake due to her sodium level. Please advise.

## 2014-02-02 NOTE — Progress Notes (Signed)
HISTORY OF PRESENT ILLNESS:  Sabrina [name] is a 58 y.o. female with multiple significant medical problems as listed below. 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 Her liver disease is quite advanced. She has been seen at [facility] by Dr. Julieta Gutting. Her last visit in this office was 01/26/2014 with the nurse practitioner. At that time, diffuse abdominal discomfort. She was felt to have UTI. This was confirmed. Klebsiella identified. Now on ciprofloxacin. She presents today for routine followup. She remains off diuretics for hyponatremia. She has had slight increase in her weight. She is a number of issues today. First, moderately severe upper abdominal pain throughout the upper abdomen with radiation into the back. This began today. No nausea or vomiting. She continues on PPI for history of GERD and ulcer disease. She is status post cholecystectomy. No fevers. Next, constipation. She needs take lactulose, for packets daily to achieve bowel movements. She continues on Xifaxan. Next, she reports being denied her disability. 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 This is what she was denied. Finally, she is due for followup at [facility] next week. She is accompanied today by her friend.  REVIEW OF SYSTEMS:  All non-GI ROS negative except for sinus and allergy, anxiety, arthritis, back pain, confusion, cough, depression, fatigue, headaches, heart murmur, sleeping problems, increased thirst  Past Medical History  Diagnosis Date  . Arthritis   . Allergic rhinitis   . Colitis   . Degenerative disc disease   . Hyperlipidemia   . Hypertension   . Celiac disease   . Bipolar disorder   . Macular degeneration   . Interstitial cystitis   . Heart murmur   . Anxiety   . Depression   . Liver disease   . Gastric ulcer   . TIA (transient ischemic attack) 06/2013;  ?10/13/2013  . Type II diabetes mellitus   . Anemia, iron deficiency 10/30/2012  . History of blood transfusion     "maybe 3-4; related to low counts" (10/13/2013)  . Hepatic encephalopathy   . Crohn's disease   . Cirrhosis     Past Surgical History  Procedure Laterality Date  . Appendectomy    . Lumbar laminectomy    . Carpal tunnel release Bilateral   . Cesarean section  1983  . Cysto/ hod/ instillatio clorpactin  07-03-2008  &  06-09-2001    INTERSTITIAL CYSTITIS  . Left shoulder /   including capsulectomy with debridement/  sad with resection of ac Left 01-25-2008  . Right shoulder arthroscopy/ open resection distal clavicle/ debridement adhesive capsulitis/ rotator cuff repair Right 08-30-2003  . Tonsillectomy and adenoidectomy    . Transthoracic echocardiogram  03-28-2011    NORMAL LVSF/ EF 60-65%/ VERY MILD AORTIC STENOSIS, TRIVIAL REGURG./ MILDLY DILATED LEFT ATRIUM  . Cystoscopy  11/26/2011    Procedure: CYSTOSCOPY;  Surgeon: Valetta Fuller, MD;  Location: [facility];  Service: Urology;  Laterality: N/A;  clorpactin  in bladder  . 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 Cholecystectomy  12/2012  . Tubal ligation  1983  . Back surgery      Social History NAYELLY LAUGHMAN  reports that she has never smoked. She has never used smokeless tobacco.  She reports that she does not drink alcohol or use illicit drugs.  family history includes Diabetes in her brother and mother; Hyperlipidemia in her brother; Hypertension in her brother, mother, and another family member. There is no history of Colon cancer.  Allergies  Allergen Reactions  . Sulfonamide Derivatives     Classic angioedema reaction  . Wheat Bran     Celiac's disease       PHYSICAL EXAMINATION: Vital signs: BP 120/70  Pulse 68  Ht 5\' 4"  (1.626 m)  Wt 136 lb 6  oz (61.859 kg)  BMI 23.40 kg/m2  Constitutional: Chronically ill-appearing with bilateral temporal wasting, no acute distress Psychiatric: alert and oriented x3, cooperative Eyes: extraocular movements intact, mild icterus present, conjunctiva pink Mouth: oral pharynx moist, no lesions Neck: supple no lymphadenopathy Cardiovascular: heart regular rate and rhythm, no murmur Lungs: clear to auscultation bilaterally Abdomen: soft, nontender, nondistended, no obvious ascites, no peritoneal signs, normal bowel sounds, no organomegaly Extremities: no lower extremity edema bilaterally Skin: Multiple spider angiomas.  Neuro: No focal deficits. Slurred speech. Mild asterixis    ASSESSMENT:  #1. Cryptogenic cirrhosis. Doing poorly. Previous problems with ascites. Diuretics discontinued due to hyponatremia. Hyponatremia persists, though better, despite holding diuretics. Now with evidence of hepatic encephalopathy (mild). #2. Upper  abdominal pain. Etiology unclear. Rule out pancreatitis #3. Constipation #4. Disability. This lady is disabled. She cannot work. Furthermore, she cannot drive. We will fill out [employer] paperwork to assist with her transportation. I will refill on her disability forms so that she gets appropriate help #5. Recent UTI. Treated   PLAN:  #1. Increase lactulose to achieve 3-5 bowel movements daily. #2. Continue other medications #3. CBC, comprehensive metabolic panel, amylase, lipase today to evaluate abdominal pain #4. Contrast-enhanced CT scan of the abdomen and pelvis today to evaluate abdominal pain #5. [facility] #6. Routine GI followup in 6 weeks.

## 2014-02-02 NOTE — Telephone Encounter (Signed)
Yes, free water restriction 2000 cc daily

## 2014-02-03 ENCOUNTER — Telehealth: Payer: Self-pay | Admitting: Gastroenterology

## 2014-02-03 NOTE — Telephone Encounter (Signed)
Reviewed with pt the instructions per Dr. Marina GoodellPerry regarding her PPI and the tylenol.

## 2014-02-03 NOTE — Telephone Encounter (Signed)
Faxed results as requested

## 2014-02-07 ENCOUNTER — Telehealth: Payer: Self-pay | Admitting: Internal Medicine

## 2014-02-07 ENCOUNTER — Telehealth: Payer: Self-pay

## 2014-02-07 MED ORDER — LACTULOSE 10 G PO PACK: 10.0000 g | PACK | Freq: Four times a day (QID) | ORAL | Status: DC

## 2014-02-07 NOTE — Telephone Encounter (Signed)
Received Prior Authorization Request form on Janumet XR via fax from [facility].  Placed in folder for Dr. Beverely Lowabori to complete.//AB/CMA

## 2014-02-07 NOTE — Telephone Encounter (Signed)
Caller name:Noami Relation to pt: Call back number:[phone] Pharmacy:[facility] [address]  Reason for call: Teran called and said the pharmacy had called her and said that we need to supply more information on her SITAGLIPTIN-METFORMIN HCL ER [phone] MG PO TB24 for her new insurance company. The pharmacy was to fax over a form for us to fill out. Aram BeechamCynthia would like for someone to call and let her know when we receive the form from the pharmacy.

## 2014-02-07 NOTE — Telephone Encounter (Signed)
Refilled Laculose reflecting the new dose of 4 times a day per Dr. Marina Goodell at last office visit

## 2014-02-08 NOTE — Telephone Encounter (Signed)
Caller name:Everlynn Relation to pt: Call back number:[phone] Pharmacy:[facility] [address]  Reason for call:  Savi called to check on Prior Authorization status she is going to run out of her medicine on Saturday, so she was just wanting to make sure it was done before then.

## 2014-02-09 ENCOUNTER — Other Ambulatory Visit: Payer: Self-pay | Admitting: General Practice

## 2014-02-09 MED ORDER — SAXAGLIPTIN-METFORMIN ER 5-1000 MG PO TB24: 1.0000 | ORAL_TABLET | Freq: Every day | ORAL | Status: AC

## 2014-02-13 ENCOUNTER — Telehealth: Payer: Self-pay

## 2014-02-13 NOTE — Telephone Encounter (Signed)
Pt results negative. Results faxed to [facility] as requested.

## 2014-02-13 NOTE — Telephone Encounter (Signed)
Message copied by Chrystie NoseHUNT, Siani [place] R on Mon Feb 13, 2014  1:37 PM ------      Message from: Carla Whilden, [place] R      Created: Mon Jan 30, 2014  2:53 PM      Regarding: BMET       Pt needs bmet in 2 weeks, order in ------

## 2014-02-13 NOTE — Telephone Encounter (Signed)
Pt aware.

## 2014-02-14 ENCOUNTER — Telehealth: Payer: Self-pay | Admitting: Internal Medicine

## 2014-02-14 NOTE — Telephone Encounter (Signed)
Spoke with pt and she is aware.

## 2014-02-14 NOTE — Telephone Encounter (Signed)
Sure, as long as we get the results when available

## 2014-02-14 NOTE — Telephone Encounter (Signed)
Pt is supposed to have BMET this week but states she has no transportation until next Thursday. States she is supposed to have some labs done at [facility] for Dr. Julieta Gutting next Thursday and wants to know if we can add labs that we need to be done at [facility]. Please advsie.

## 2014-02-15 ENCOUNTER — Telehealth: Payer: Self-pay

## 2014-02-15 NOTE — Telephone Encounter (Signed)
Spoke with [facility] rep who wanted to clarify the labs needing to be done for patient.  Per a phone note between Selinda Michaels and Dr. Marina Goodell, she needs a BMET.  She is going to fax me labs taken on 8/13 and make sure [facility] faxes the results of the labs she is having drawn later this wee.

## 2014-02-22 ENCOUNTER — Ambulatory Visit (INDEPENDENT_AMBULATORY_CARE_PROVIDER_SITE_OTHER): Payer: 59 | Admitting: Family Medicine

## 2014-02-22 ENCOUNTER — Other Ambulatory Visit: Payer: Self-pay | Admitting: General Practice

## 2014-02-22 ENCOUNTER — Other Ambulatory Visit: Payer: Self-pay | Admitting: Family Medicine

## 2014-02-22 ENCOUNTER — Encounter: Payer: Self-pay | Admitting: Family Medicine

## 2014-02-22 ENCOUNTER — Ambulatory Visit: Payer: 59 | Admitting: Neurology

## 2014-02-22 VITALS — BP 118/78 | HR 81 | Temp 98.2°F | Resp 16 | Wt 135.0 lb

## 2014-02-22 DIAGNOSIS — H6980 Other specified disorders of Eustachian tube, unspecified ear: Secondary | ICD-10-CM

## 2014-02-22 DIAGNOSIS — R51 Headache: Secondary | ICD-10-CM

## 2014-02-22 DIAGNOSIS — M316 Other giant cell arteritis: Secondary | ICD-10-CM

## 2014-02-22 DIAGNOSIS — E43 Unspecified severe protein-calorie malnutrition: Secondary | ICD-10-CM

## 2014-02-22 DIAGNOSIS — H6983 Other specified disorders of Eustachian tube, bilateral: Secondary | ICD-10-CM

## 2014-02-22 DIAGNOSIS — J40 Bronchitis, not specified as acute or chronic: Secondary | ICD-10-CM

## 2014-02-22 DIAGNOSIS — R519 Headache, unspecified: Secondary | ICD-10-CM

## 2014-02-22 HISTORY — DX: Bronchitis, not specified as acute or chronic: J40

## 2014-02-22 LAB — CBC WITH DIFFERENTIAL/PLATELET
BASOS PCT: 0.4 % (ref 0.0–3.0)
Basophils Absolute: 0 10*3/uL (ref 0.0–0.1)
EOS ABS: 0.3 10*3/uL (ref 0.0–0.7)
Eosinophils Relative: 3.5 % (ref 0.0–5.0)
HCT: 37.7 % (ref 36.0–46.0)
Hemoglobin: 12.5 g/dL (ref 12.0–15.0)
Lymphocytes Relative: 17.7 % (ref 12.0–46.0)
Lymphs Abs: 1.7 10*3/uL (ref 0.7–4.0)
MCHC: 33.2 g/dL (ref 30.0–36.0)
MCV: 105.5 fl — AB (ref 78.0–100.0)
Monocytes Absolute: 1 10*3/uL (ref 0.1–1.0)
Monocytes Relative: 10.1 % (ref 3.0–12.0)
NEUTROS ABS: 6.5 10*3/uL (ref 1.4–7.7)
Neutrophils Relative %: 68.3 % (ref 43.0–77.0)
Platelets: 193 10*3/uL (ref 150.0–400.0)
RBC: 3.58 Mil/uL — AB (ref 3.87–5.11)
RDW: 16.7 % — ABNORMAL HIGH (ref 11.5–15.5)
WBC: 9.5 10*3/uL (ref 4.0–10.5)

## 2014-02-22 LAB — SEDIMENTATION RATE: Sed Rate: 67 mm/hr — ABNORMAL HIGH (ref 0–22)

## 2014-02-22 MED ORDER — PREDNISONE 20 MG PO TABS: ORAL_TABLET | ORAL | Status: DC

## 2014-02-22 MED ORDER — BENZONATATE 200 MG PO CAPS: 200.0000 mg | ORAL_CAPSULE | Freq: Three times a day (TID) | ORAL | Status: DC | PRN

## 2014-02-22 MED ORDER — AMOXICILLIN 500 MG PO CAPS: 500.0000 mg | ORAL_CAPSULE | Freq: Two times a day (BID) | ORAL | Status: DC

## 2014-02-22 NOTE — Assessment & Plan Note (Signed)
New.  Pt's sxs consistent w/ bronchitis.  Due to underlying liver disease and immunosuppression b/c of this, will start abx.  Reviewed supportive care and red flags that should prompt return.  Pt expressed understanding and is in agreement w/ plan.

## 2014-02-22 NOTE — Assessment & Plan Note (Signed)
New.  Unclear as to cause.  Possibly due to temporal wasting and exposure of nerves as they cross the zygomatic arch.  Pt does not have visual changes, pain w/ opening mouth, or jaw claudication to suggest temporal arteritis.  Will check ESR to r/o.  Will follow closely.

## 2014-02-22 NOTE — Progress Notes (Signed)
   Subjective:    Patient ID: Sabrina [name], female    DOB: [birth year], 58 y.o.   MRN: [id number]  HPI Ear pain- sxs started 2 weeks ago.  sxs started w/ cough- intermittently productive of white sputum.  Bilateral ear pain extending forward into jaw- just started today.  No fever.  Not painful to chew.  No visual changes.  No sinus pain.   Review of Systems For ROS see HPI     Objective:   Physical Exam  Vitals reviewed. Constitutional: She appears well-developed and well-nourished. No distress.  HENT:  Head: Normocephalic and atraumatic.  TMs normal bilaterally Mild nasal congestion Throat w/out erythema, edema, or exudate + TTP over zygomatic arches bilaterally  Eyes: Conjunctivae and EOM are normal. Pupils are equal, round, and reactive to light.  Neck: Normal range of motion. Neck supple.  Cardiovascular: Normal rate, regular rhythm, normal heart sounds and intact distal pulses.   No murmur heard. Pulmonary/Chest: Effort normal and breath sounds normal. No respiratory distress. She has no wheezes.  + hacking cough  Lymphadenopathy:    She has no cervical adenopathy.          Assessment & Plan:

## 2014-02-22 NOTE — Progress Notes (Signed)
Pre visit review using our clinic review tool, if applicable. No additional management support is needed unless otherwise documented below in the visit note. 

## 2014-02-22 NOTE — Assessment & Plan Note (Signed)
Pt's ear pain and normal PE consistent w/ eustachian tube dysfxn.  Pt to resume Claritin daily.

## 2014-02-22 NOTE — Patient Instructions (Signed)
Follow up as needed Start the Amoxicillin twice daily for infection Use the cough pills as needed We'll notify you of your lab results and make any changes if needed Call with any questions or concerns Hang in there!!

## 2014-02-22 NOTE — Assessment & Plan Note (Signed)
Pt has lost nearly all subcutaneous fat.  Question whether pt's facial pain is due to temporal wasting and exposure of the nerves as they track over the zygomatic arch.  Encouraged pt to eat regularly.

## 2014-02-23 ENCOUNTER — Other Ambulatory Visit: Payer: Self-pay

## 2014-02-23 ENCOUNTER — Telehealth: Payer: Self-pay

## 2014-02-23 ENCOUNTER — Encounter: Payer: Self-pay | Admitting: Vascular Surgery

## 2014-02-23 ENCOUNTER — Encounter (HOSPITAL_COMMUNITY): Payer: Self-pay | Admitting: Pharmacy Technician

## 2014-02-23 ENCOUNTER — Ambulatory Visit (INDEPENDENT_AMBULATORY_CARE_PROVIDER_SITE_OTHER): Payer: 59 | Admitting: Vascular Surgery

## 2014-02-23 VITALS — BP 117/47 | HR 71 | Ht 64.0 in | Wt 137.0 lb

## 2014-02-23 DIAGNOSIS — M316 Other giant cell arteritis: Secondary | ICD-10-CM | POA: Insufficient documentation

## 2014-02-23 NOTE — Progress Notes (Signed)
[facility] [place] HISTORY AND PHYSICAL   History of Present Illness:  Patient is a 58 y.o. year old female who presents for evaluation of headaches and possible temporal arteritis. The patient has a history of chronic headaches. However in the last several weeks they have been much worse. This is primarily centered over the left temporal region. She states she has had some blurriness of her left thigh. She was started on steroids yesterday by Dr. Beverely Low.  Other medical problems include NASH cirrhosis awaiting liver transplant, hyperlipidemia, hypertension, bipolar disorder, celiac disease, depression, anxiety, gastric ulcer, diabetes, encephalopathy, Crohn's disease all of these are currently stable.    Past Medical History  Diagnosis Date  . Arthritis   . Allergic rhinitis   . Colitis   . Degenerative disc disease   . Hyperlipidemia   . Hypertension   . Celiac disease   . Bipolar disorder   . Macular degeneration   . Interstitial cystitis   . Heart murmur   . Anxiety   . Depression   . Liver disease   . Gastric ulcer   . TIA (transient ischemic attack) 06/2013; ?10/13/2013  . Type II diabetes mellitus   . Anemia, iron deficiency 10/30/2012  . History of blood transfusion     "maybe 3-4; related to low counts" (10/13/2013)  . Hepatic encephalopathy   . Crohn's disease   . Cirrhosis   . Stroke     Past Surgical History  Procedure Laterality Date  . Appendectomy    . Lumbar laminectomy    . Carpal tunnel release Bilateral   . Cesarean section  1983  . Cysto/ hod/ instillatio clorpactin  07-03-2008  &  06-09-2001    INTERSTITIAL CYSTITIS  . Left shoulder /   including capsulectomy with debridement/  sad with resection of ac Left 01-25-2008  . Right shoulder arthroscopy/ open resection distal clavicle/ debridement adhesive capsulitis/ rotator cuff repair Right 08-30-2003  . Tonsillectomy and adenoidectomy    . Transthoracic echocardiogram  03-28-2011     NORMAL LVSF/ EF 60-65%/ VERY MILD AORTIC STENOSIS, TRIVIAL REGURG./ MILDLY DILATED LEFT ATRIUM  . Cystoscopy  11/26/2011    Procedure: CYSTOSCOPY;  Surgeon: Valetta Fuller, MD;  Location: [facility];  Service: Urology;  Laterality: N/A;  clorpactin  in bladder  . 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 Cholecystectomy  12/2012  . Tubal ligation  1983  . Back surgery      Social History History  Substance Use Topics  . Smoking status: Never Smoker   . Smokeless tobacco: Never Used  . Alcohol Use: No    Family History Family History  Problem Relation Age of Onset  . Diabetes Mother   . Hypertension Mother   . Hyperlipidemia Mother   . Diabetes Brother   . Hypertension Brother   . Hyperlipidemia Brother   . Hypertension    . Colon cancer Neg Hx   . Deep vein thrombosis Father     Allergies  Allergies  Allergen Reactions  . Sulfonamide Derivatives     Classic angioedema reaction  . Ciprofloxacin     Mouth ulcers  . Wheat Bran     Celiac's disease     Current Outpatient Prescriptions  Medication Sig Dispense Refill  . amoxicillin (AMOXIL) 500 MG capsule Take 1 capsule (500 mg total)  by mouth 2 (two) times daily.  14 capsule  0  . benzonatate (TESSALON) 200 MG capsule Take 1 capsule (200 mg total) by mouth 3 (three) times daily as needed for cough.  60 capsule  0  . FLUoxetine (PROZAC) 40 MG capsule Take 40 mg by mouth every morning.       . furosemide (LASIX) 40 MG tablet Take 40 mg by mouth daily.      . gabapentin (NEURONTIN) 600 MG tablet Take 600 mg by mouth 4 (four) times daily.      . iron polysaccharides (POLY-IRON 150) 150 MG capsule Take 1 capsule (150 mg total) by mouth 2 (two) times daily.  180 capsule  3  . lactulose (CHRONULAC) 10 GM/15ML solution Take by mouth.      . loratadine (CLARITIN) 10 MG tablet  Take 10 mg by mouth daily.      . mesalamine (LIALDA) 1.2 G EC tablet Take 1.2 g by mouth 2 (two) times daily.      . Multiple Vitamins-Minerals (MULTIVITAMIN WITH MINERALS) tablet Take 1 tablet by mouth daily.      . Nutritional Supplements (ENSURE PO) Take by mouth.      . omeprazole (PRILOSEC) 40 MG capsule Take 40 mg by mouth daily.      . ondansetron (ZOFRAN) 4 MG tablet Take 1 tablet (4 mg total) by mouth every 8 (eight) hours as needed for nausea or vomiting.  20 tablet  0  . pentosan polysulfate (ELMIRON) 100 MG capsule Take 200 mg by mouth 2 (two) times daily.       . predniSONE (DELTASONE) 20 MG tablet Take 2 tablets by mouth for 10 days, then take 1 tablet by mouth for 5 days, then 1/2 tablet by mouth for 4 days.  27 tablet  0  . rifaximin (XIFAXAN) 550 MG TABS tablet Take 1 tablet (550 mg total) by mouth 2 (two) times daily.  60 tablet  11  . Saxagliptin-Metformin 10-998 MG TB24 Take 1 tablet by mouth daily.  30 tablet  6  . spironolactone (ALDACTONE) 50 MG tablet Take 50 mg by mouth daily.      . traZODone (DESYREL) 50 MG tablet Take 50 mg by mouth at bedtime.       No current facility-administered medications for this visit.    ROS:   General:  No weight loss, Fever, chills  HEENT: No recent headaches, no nasal bleeding, no visual changes, no sore throat  Neurologic: No dizziness, blackouts, seizures. No recent symptoms of stroke or mini- stroke. No recent episodes of slurred speech, or temporary blindness.  Cardiac: No recent episodes of chest pain/pressure, no shortness of breath at rest.  + shortness of breath with exertion.  Denies history of atrial fibrillation or irregular heartbeat  Vascular: No history of rest pain in feet.  No history of claudication.  No history of non-healing ulcer, No history of DVT   Pulmonary: No home oxygen, no productive cough, no hemoptysis,  No asthma or wheezing  Musculoskeletal:  [ ] Arthritis, [ ] Low back pain,  [ ] Joint  pain  Hematologic:No history of hypercoagulable state.  No history of easy bleeding.  No history of anemia  Gastrointestinal: No hematochezia or melena,  No gastroesophageal reflux, no trouble swallowing  Urinary: [ ] chronic Kidney disease, [ ] on HD - [ ] MWF or [ ] TTHS, [ ] Burning with urination, [ ] Frequent urination, [ ] Difficulty urinating;   Skin: No   rashes  Psychological: No history of anxiety,  No history of depression   Physical Examination  Filed Vitals:   02/23/14 1450 02/23/14 1454  BP: 125/53 117/47  Pulse: 71   Height: 5\' 4"  (1.626 m)   Weight: 137 lb (62.143 kg)   SpO2: 100%     Body mass index is 23.5 kg/(m^2).  General:  Alert and oriented, no acute distress HEENT: Normal, mild tenderness over the left temporal region Neck: No bruit or JVD Skin: No rash, spider angiomata over the face and chest Musculoskeletal: No deformity or edema  Neurologic: Upper and lower extremity motor 5/5 and symmetric  ASSESSMENT:  Headaches, possible temporal arteritis   PLAN:  Left temporal artery biopsy on Monday, August 31. Risks benefits possible complications and procedure details were explained the patient today. These include but are not limited to. Incisional numbness bleeding infection   Fabienne Bruns, MD [facility] [place] Office: [phone] Pager: [phone]

## 2014-02-23 NOTE — Telephone Encounter (Signed)
Fasting sugars will be elevated when taking Prednisone but at this time, there is no need to change meds since this is hopefully short term!

## 2014-02-23 NOTE — Telephone Encounter (Signed)
Pt notified and expressed an understanding.  

## 2014-02-23 NOTE — Telephone Encounter (Signed)
Sabrina [name] [phone]  After taking 1 dose of her prednisone she had a fasting blood sugar of 209 this morning, she was wondering if she needed to increase her medicine or what should she do. Call her when you can.

## 2014-02-24 ENCOUNTER — Encounter (HOSPITAL_COMMUNITY)
Admission: RE | Admit: 2014-02-24 | Discharge: 2014-02-24 | Disposition: A | Discharge status: Home / Self Care | Payer: 59 | Source: Ambulatory Visit | Attending: Vascular Surgery | Admitting: Vascular Surgery

## 2014-02-24 ENCOUNTER — Encounter (HOSPITAL_COMMUNITY): Payer: Self-pay

## 2014-02-24 DIAGNOSIS — J309 Allergic rhinitis, unspecified: Secondary | ICD-10-CM | POA: Diagnosis not present

## 2014-02-24 DIAGNOSIS — Z8673 Personal history of transient ischemic attack (TIA), and cerebral infarction without residual deficits: Secondary | ICD-10-CM | POA: Diagnosis not present

## 2014-02-24 DIAGNOSIS — K900 Celiac disease: Secondary | ICD-10-CM | POA: Diagnosis not present

## 2014-02-24 DIAGNOSIS — Z882 Allergy status to sulfonamides status: Secondary | ICD-10-CM | POA: Diagnosis not present

## 2014-02-24 DIAGNOSIS — E785 Hyperlipidemia, unspecified: Secondary | ICD-10-CM | POA: Diagnosis not present

## 2014-02-24 DIAGNOSIS — Z9089 Acquired absence of other organs: Secondary | ICD-10-CM | POA: Diagnosis not present

## 2014-02-24 DIAGNOSIS — Z881 Allergy status to other antibiotic agents status: Secondary | ICD-10-CM | POA: Diagnosis not present

## 2014-02-24 DIAGNOSIS — I10 Essential (primary) hypertension: Secondary | ICD-10-CM | POA: Diagnosis not present

## 2014-02-24 DIAGNOSIS — E119 Type 2 diabetes mellitus without complications: Secondary | ICD-10-CM | POA: Diagnosis not present

## 2014-02-24 DIAGNOSIS — H3530 Unspecified macular degeneration: Secondary | ICD-10-CM | POA: Diagnosis not present

## 2014-02-24 DIAGNOSIS — F319 Bipolar disorder, unspecified: Secondary | ICD-10-CM | POA: Diagnosis not present

## 2014-02-24 DIAGNOSIS — Z79899 Other long term (current) drug therapy: Secondary | ICD-10-CM | POA: Diagnosis not present

## 2014-02-24 DIAGNOSIS — R51 Headache: Secondary | ICD-10-CM | POA: Diagnosis present

## 2014-02-24 DIAGNOSIS — M129 Arthropathy, unspecified: Secondary | ICD-10-CM | POA: Diagnosis not present

## 2014-02-24 DIAGNOSIS — R011 Cardiac murmur, unspecified: Secondary | ICD-10-CM | POA: Diagnosis not present

## 2014-02-24 DIAGNOSIS — Z91018 Allergy to other foods: Secondary | ICD-10-CM | POA: Diagnosis not present

## 2014-02-24 DIAGNOSIS — N3010 Interstitial cystitis (chronic) without hematuria: Secondary | ICD-10-CM | POA: Diagnosis not present

## 2014-02-24 LAB — POCT I-STAT 4, (NA,K, GLUC, HGB,HCT)
Glucose, Bld: 284 mg/dL — ABNORMAL HIGH (ref 70–99)
HCT: 42 % (ref 36.0–46.0)
Hemoglobin: 14.3 g/dL (ref 12.0–15.0)
POTASSIUM: 4.4 meq/L (ref 3.7–5.3)
SODIUM: 135 meq/L — AB (ref 137–147)

## 2014-02-24 MED ORDER — CHLORHEXIDINE GLUCONATE CLOTH 2 % EX PADS: 6.0000 | MEDICATED_PAD | Freq: Once | CUTANEOUS | Status: DC

## 2014-02-24 MED ORDER — SODIUM CHLORIDE 0.9 % IV SOLN: INTRAVENOUS | Status: DC

## 2014-02-24 NOTE — Pre-Procedure Instructions (Signed)
TONI SCHNIPKE  02/24/2014   Your procedure is scheduled on:  02/27/14  Report to [facility] Admitting at 7 AM.  Call this number if you have problems the morning of surgery: [phone]   Remember:   Do not eat food or drink liquids after midnight.   Take these medicines the morning of surgery with A SIP OF WATER: prozac,neurontin,amixil,claritin   Do not wear jewelry, make-up or nail polish.  Do not wear lotions, powders, or perfumes. You may wear deodorant.  Do not shave 48 hours prior to surgery. Men may shave face and neck.  Do not bring valuables to the hospital.  [facility] is not responsible                  for any belongings or valuables.               Contacts, dentures or bridgework may not be worn into surgery.  Leave suitcase in the car. After surgery it may be brought to your room.  For patients admitted to the hospital, discharge time is determined by your                treatment team.               Patients discharged the day of surgery will not be allowed to drive  home.  Name and phone number of your driver:   Special Instructions: Shower using CHG 2 nights before surgery and the night before surgery.  If you shower the day of surgery use CHG.  Use special wash - you have one bottle of CHG for all showers.  You should use approximately 1/3 of the bottle for each shower.   Please read over the following fact sheets that you were given: Pain Booklet, Coughing and Deep Breathing and Surgical Site Infection Prevention

## 2014-02-26 MED ORDER — DEXTROSE 5 % IV SOLN
1.5000 g | INTRAVENOUS | Status: AC
  Administered 2014-02-27: 1.5 g via INTRAVENOUS
  Filled 2014-02-26: qty 1.5

## 2014-02-27 ENCOUNTER — Encounter (HOSPITAL_COMMUNITY): Payer: Self-pay | Admitting: Anesthesiology

## 2014-02-27 ENCOUNTER — Ambulatory Visit (HOSPITAL_COMMUNITY)
Admission: RE | Admit: 2014-02-27 | Discharge: 2014-02-27 | Disposition: A | Discharge status: Home / Self Care | Payer: 59 | Source: Ambulatory Visit | Attending: Vascular Surgery | Admitting: Vascular Surgery

## 2014-02-27 ENCOUNTER — Telehealth: Payer: Self-pay | Admitting: Vascular Surgery

## 2014-02-27 ENCOUNTER — Ambulatory Visit (HOSPITAL_COMMUNITY): Payer: 59

## 2014-02-27 ENCOUNTER — Encounter (HOSPITAL_COMMUNITY): Payer: 59 | Admitting: Vascular Surgery

## 2014-02-27 ENCOUNTER — Ambulatory Visit (HOSPITAL_COMMUNITY): Payer: 59 | Admitting: Anesthesiology

## 2014-02-27 ENCOUNTER — Encounter (HOSPITAL_COMMUNITY)
Admission: RE | Disposition: A | Discharge status: Home / Self Care | Payer: Self-pay | Source: Ambulatory Visit | Attending: Vascular Surgery

## 2014-02-27 DIAGNOSIS — R51 Headache: Secondary | ICD-10-CM

## 2014-02-27 DIAGNOSIS — Z881 Allergy status to other antibiotic agents status: Secondary | ICD-10-CM | POA: Insufficient documentation

## 2014-02-27 DIAGNOSIS — Z91018 Allergy to other foods: Secondary | ICD-10-CM | POA: Insufficient documentation

## 2014-02-27 DIAGNOSIS — Z882 Allergy status to sulfonamides status: Secondary | ICD-10-CM | POA: Insufficient documentation

## 2014-02-27 DIAGNOSIS — Z9089 Acquired absence of other organs: Secondary | ICD-10-CM | POA: Insufficient documentation

## 2014-02-27 DIAGNOSIS — J309 Allergic rhinitis, unspecified: Secondary | ICD-10-CM | POA: Insufficient documentation

## 2014-02-27 DIAGNOSIS — K900 Celiac disease: Secondary | ICD-10-CM | POA: Insufficient documentation

## 2014-02-27 DIAGNOSIS — E119 Type 2 diabetes mellitus without complications: Secondary | ICD-10-CM | POA: Insufficient documentation

## 2014-02-27 DIAGNOSIS — H3530 Unspecified macular degeneration: Secondary | ICD-10-CM | POA: Insufficient documentation

## 2014-02-27 DIAGNOSIS — I10 Essential (primary) hypertension: Secondary | ICD-10-CM | POA: Insufficient documentation

## 2014-02-27 DIAGNOSIS — R188 Other ascites: Secondary | ICD-10-CM

## 2014-02-27 DIAGNOSIS — F319 Bipolar disorder, unspecified: Secondary | ICD-10-CM | POA: Insufficient documentation

## 2014-02-27 DIAGNOSIS — E785 Hyperlipidemia, unspecified: Secondary | ICD-10-CM | POA: Insufficient documentation

## 2014-02-27 DIAGNOSIS — N3010 Interstitial cystitis (chronic) without hematuria: Secondary | ICD-10-CM | POA: Insufficient documentation

## 2014-02-27 DIAGNOSIS — Z8673 Personal history of transient ischemic attack (TIA), and cerebral infarction without residual deficits: Secondary | ICD-10-CM | POA: Insufficient documentation

## 2014-02-27 DIAGNOSIS — R011 Cardiac murmur, unspecified: Secondary | ICD-10-CM | POA: Insufficient documentation

## 2014-02-27 DIAGNOSIS — M129 Arthropathy, unspecified: Secondary | ICD-10-CM | POA: Insufficient documentation

## 2014-02-27 DIAGNOSIS — Z79899 Other long term (current) drug therapy: Secondary | ICD-10-CM | POA: Insufficient documentation

## 2014-02-27 HISTORY — DX: Bronchitis, not specified as acute or chronic: J40

## 2014-02-27 HISTORY — PX: ARTERY BIOPSY: SHX891

## 2014-02-27 LAB — GLUCOSE, CAPILLARY
Glucose-Capillary: 160 mg/dL — ABNORMAL HIGH (ref 70–99)
Glucose-Capillary: 157 mg/dL — ABNORMAL HIGH (ref 70–99)

## 2014-02-27 SURGERY — BIOPSY TEMPORAL ARTERY
Anesthesia: Monitor Anesthesia Care | Site: Head | Laterality: Left

## 2014-02-27 MED ORDER — FENTANYL CITRATE 0.05 MG/ML IJ SOLN
INTRAMUSCULAR | Status: AC
  Filled 2014-02-27: qty 5

## 2014-02-27 MED ORDER — TRAMADOL HCL 50 MG PO TABS: 50.0000 mg | ORAL_TABLET | Freq: Four times a day (QID) | ORAL | Status: DC | PRN

## 2014-02-27 MED ORDER — ONDANSETRON HCL 4 MG/2ML IJ SOLN: 4.0000 mg | Freq: Four times a day (QID) | INTRAMUSCULAR | Status: DC | PRN

## 2014-02-27 MED ORDER — OXYCODONE HCL 5 MG PO TABS: 5.0000 mg | ORAL_TABLET | Freq: Once | ORAL | Status: DC | PRN

## 2014-02-27 MED ORDER — ONDANSETRON HCL 4 MG/2ML IJ SOLN
INTRAMUSCULAR | Status: AC
  Filled 2014-02-27: qty 2

## 2014-02-27 MED ORDER — FENTANYL CITRATE 0.05 MG/ML IJ SOLN
INTRAMUSCULAR | Status: DC | PRN
  Administered 2014-02-27 (×5): 50 ug via INTRAVENOUS

## 2014-02-27 MED ORDER — ALBUMIN HUMAN 25 % IV SOLN
12.5000 g | Freq: Once | INTRAVENOUS | Status: DC
  Filled 2014-02-27: qty 50

## 2014-02-27 MED ORDER — PROPOFOL INFUSION 10 MG/ML OPTIME
INTRAVENOUS | Status: DC | PRN
  Administered 2014-02-27: 75 ug/kg/min via INTRAVENOUS

## 2014-02-27 MED ORDER — LIDOCAINE HCL (CARDIAC) 20 MG/ML IV SOLN
INTRAVENOUS | Status: DC | PRN
  Administered 2014-02-27: 50 mg via INTRAVENOUS

## 2014-02-27 MED ORDER — LACTATED RINGERS IV SOLN
INTRAVENOUS | Status: DC
  Administered 2014-02-27: 09:00:00 via INTRAVENOUS

## 2014-02-27 MED ORDER — PROPOFOL 10 MG/ML IV BOLUS
INTRAVENOUS | Status: AC
  Filled 2014-02-27: qty 20

## 2014-02-27 MED ORDER — ONDANSETRON HCL 4 MG/2ML IJ SOLN
INTRAMUSCULAR | Status: DC | PRN
  Administered 2014-02-27: 4 mg via INTRAVENOUS

## 2014-02-27 MED ORDER — LIDOCAINE HCL (CARDIAC) 20 MG/ML IV SOLN
INTRAVENOUS | Status: AC
  Filled 2014-02-27: qty 5

## 2014-02-27 MED ORDER — LIDOCAINE HCL (PF) 1 % IJ SOLN
INTRAMUSCULAR | Status: AC
  Filled 2014-02-27: qty 30

## 2014-02-27 MED ORDER — 0.9 % SODIUM CHLORIDE (POUR BTL) OPTIME
TOPICAL | Status: DC | PRN
  Administered 2014-02-27: 1000 mL

## 2014-02-27 MED ORDER — OXYCODONE HCL 5 MG/5ML PO SOLN: 5.0000 mg | Freq: Once | ORAL | Status: DC | PRN

## 2014-02-27 MED ORDER — LIDOCAINE HCL (PF) 1 % IJ SOLN
INTRAMUSCULAR | Status: DC | PRN
  Administered 2014-02-27: 3 mL via INTRADERMAL

## 2014-02-27 MED ORDER — LACTATED RINGERS IV SOLN
INTRAVENOUS | Status: DC | PRN
  Administered 2014-02-27: 09:00:00 via INTRAVENOUS

## 2014-02-27 MED ORDER — METHYLPREDNISOLONE SODIUM SUCC 125 MG IJ SOLR
INTRAMUSCULAR | Status: AC
  Filled 2014-02-27: qty 2

## 2014-02-27 MED ORDER — PROPOFOL 10 MG/ML IV BOLUS
INTRAVENOUS | Status: DC | PRN
  Administered 2014-02-27: 20 mg via INTRAVENOUS

## 2014-02-27 MED ORDER — FENTANYL CITRATE 0.05 MG/ML IJ SOLN: 25.0000 ug | INTRAMUSCULAR | Status: DC | PRN

## 2014-02-27 MED ORDER — METHYLPREDNISOLONE SODIUM SUCC 125 MG IJ SOLR
INTRAMUSCULAR | Status: DC | PRN
  Administered 2014-02-27: 125 mg via INTRAVENOUS

## 2014-02-27 SURGICAL SUPPLY — 38 items
ADH SKN CLS APL DERMABOND .7 (GAUZE/BANDAGES/DRESSINGS) ×1
BALL CTTN LRG ABS STRL LF (GAUZE/BANDAGES/DRESSINGS) ×1
BLADE 10 SAFETY STRL DISP (BLADE) ×2 IMPLANT
CANISTER SUCTION 2500CC (MISCELLANEOUS) ×2 IMPLANT
CLIP TI WIDE RED SMALL 6 (CLIP) ×2 IMPLANT
CONT SPEC 4OZ CLIKSEAL STRL BL (MISCELLANEOUS) ×2 IMPLANT
COTTONBALL LRG STERILE PKG (GAUZE/BANDAGES/DRESSINGS) ×2 IMPLANT
COVER SURGICAL LIGHT HANDLE (MISCELLANEOUS) ×2 IMPLANT
DECANTER SPIKE VIAL GLASS SM (MISCELLANEOUS) ×2 IMPLANT
DERMABOND ADVANCED (GAUZE/BANDAGES/DRESSINGS) ×1
DERMABOND ADVANCED .7 DNX12 (GAUZE/BANDAGES/DRESSINGS) ×1 IMPLANT
DRAPE ORTHO SPLIT 77X108 STRL (DRAPES) ×2
DRAPE PROXIMA HALF (DRAPES) ×2 IMPLANT
DRAPE SURG ORHT 6 SPLT 77X108 (DRAPES) ×1 IMPLANT
ELECT REM PT RETURN 9FT ADLT (ELECTROSURGICAL) ×2
ELECTRODE REM PT RTRN 9FT ADLT (ELECTROSURGICAL) ×1 IMPLANT
GAUZE SPONGE 4X4 16PLY XRAY LF (GAUZE/BANDAGES/DRESSINGS) ×2 IMPLANT
GEL ULTRASOUND 20GR AQUASONIC (MISCELLANEOUS) ×2 IMPLANT
GLOVE BIO SURGEON STRL SZ7.5 (GLOVE) ×2 IMPLANT
GOWN STRL REUS W/ TWL LRG LVL3 (GOWN DISPOSABLE) ×3 IMPLANT
GOWN STRL REUS W/TWL LRG LVL3 (GOWN DISPOSABLE) ×6
KIT BASIN OR (CUSTOM PROCEDURE TRAY) ×2 IMPLANT
KIT ROOM TURNOVER OR (KITS) ×2 IMPLANT
NEEDLE HYPO 25GX1X1/2 BEV (NEEDLE) ×2 IMPLANT
NS IRRIG 1000ML POUR BTL (IV SOLUTION) ×2 IMPLANT
PACK GENERAL/GYN (CUSTOM PROCEDURE TRAY) ×2 IMPLANT
PAD ARMBOARD 7.5X6 YLW CONV (MISCELLANEOUS) ×4 IMPLANT
PROBE PENCIL 8 MHZ STRL DISP (MISCELLANEOUS) ×2 IMPLANT
SUCTION FRAZIER TIP 10 FR DISP (SUCTIONS) IMPLANT
SUT PROLENE 6 0 CC (SUTURE) IMPLANT
SUT SILK 3 0 (SUTURE) ×2
SUT SILK 3-0 18XBRD TIE 12 (SUTURE) ×1 IMPLANT
SUT VIC AB 3-0 SH 27 (SUTURE) ×2
SUT VIC AB 3-0 SH 27XBRD (SUTURE) ×1 IMPLANT
SYR CONTROL 10ML LL (SYRINGE) ×2 IMPLANT
TOWEL OR 17X24 6PK STRL BLUE (TOWEL DISPOSABLE) ×2 IMPLANT
TOWEL OR 17X26 10 PK STRL BLUE (TOWEL DISPOSABLE) ×2 IMPLANT
WATER STERILE IRR 1000ML POUR (IV SOLUTION) ×2 IMPLANT

## 2014-02-27 NOTE — Telephone Encounter (Addendum)
Message copied by Fredrich Birks on Mon Feb 27, 2014  3:10 PM ------      Message from: Lorin Mercy K      Created: Mon Feb 27, 2014 10:44 AM      Regarding: Schedule                   ----- Message -----         From: Dara Lords, PA-C         Sent: 02/27/2014  10:25 AM           To: [facility] Charge Pool            S/p temporal artery bx 02/27/14.  F/u with [facility] in 2 weeks.            Thanks,      Samantha ------  02/27/14: spoke with patient to schedule appointment, dpm

## 2014-02-27 NOTE — Interval H&P Note (Signed)
History and Physical Interval Note:  02/27/2014 7:26 AM  Sabrina [name]  has presented today for surgery, with the diagnosis of Giant cell arteritis   The various methods of treatment have been discussed with the patient and family. After consideration of risks, benefits and other options for treatment, the patient has consented to  Procedure(s): BIOPSY TEMPORAL ARTERY (Left) as a surgical intervention .  The patient's history has been reviewed, patient examined, no change in status, stable for surgery.  I have reviewed the patient's chart and labs.  Questions were answered to the patient's satisfaction.     FIELDS,CHARLES E

## 2014-02-27 NOTE — Transfer of Care (Signed)
Immediate Anesthesia Transfer of Care Note  [name]  Procedure(s) Performed: Procedure(s): BIOPSY TEMPORAL ARTERY (Left)  Patient Location: PACU  Anesthesia Type:MAC  Level of Consciousness: awake, alert , oriented and patient cooperative  Airway & Oxygen Therapy: Patient Spontanous Breathing  Post-op Assessment: Report given to PACU RN, Post -op Vital signs reviewed and stable and Patient moving all extremities  Post vital signs: Reviewed and stable  Complications: No apparent anesthesia complications

## 2014-02-27 NOTE — Progress Notes (Signed)
Call to Dr. Darrick Penna for the confirmation on order for preop, no new orders.  Spoke with Dr. Chaney Malling, re: pt. Recent diagnosis of bronchitis, new order rec'd for 2 V CXR this a.m.

## 2014-02-27 NOTE — Anesthesia Preprocedure Evaluation (Addendum)
Anesthesia Evaluation  Patient identified by MRN, date of birth, ID band Patient awake    Reviewed: Allergy & Precautions, H&P , NPO status , Patient's Chart, lab work & pertinent test results  Airway Mallampati: II  Neck ROM: full    Dental   Pulmonary  Recent URI.  Currently taking antibiotics.  Pt feels better already. breath sounds clear to auscultation        Cardiovascular hypertension, + Peripheral Vascular Disease Rhythm:Regular Rate:Normal     Neuro/Psych  Headaches, Anxiety Depression Bipolar Disorder TIACVA    GI/Hepatic PUD, (+) Cirrhosis -      , H/o hepatic encephalopathy   Endo/Other  diabetes, Type 2  Renal/GU      Musculoskeletal  (+) Arthritis -,   Abdominal   Peds  Hematology   Anesthesia Other Findings   Reproductive/Obstetrics                          Anesthesia Physical Anesthesia Plan  ASA: III  Anesthesia Plan: MAC   Post-op Pain Management:    Induction: Intravenous  Airway Management Planned: Simple Face Mask  Additional Equipment:   Intra-op Plan:   Post-operative Plan:   Informed Consent: I have reviewed the patients History and Physical, chart, labs and discussed the procedure including the risks, benefits and alternatives for the proposed anesthesia with the patient or authorized representative who has indicated his/her understanding and acceptance.     Plan Discussed with: CRNA, Anesthesiologist and Surgeon  Anesthesia Plan Comments:         Anesthesia Quick Evaluation

## 2014-02-27 NOTE — Anesthesia Postprocedure Evaluation (Signed)
Anesthesia Post Note  [name]  Procedure(s) Performed: Procedure(s) (LRB): BIOPSY TEMPORAL ARTERY (Left)  Anesthesia type: MAC  Patient location: PACU  Post pain: Pain level controlled and Adequate analgesia  Post assessment: Post-op Vital signs reviewed, Patient's Cardiovascular Status Stable and Respiratory Function Stable  Last Vitals:  Filed Vitals:   02/27/14 1130  BP:   Pulse:   Temp: 37 C  Resp:     Post vital signs: Reviewed and stable  Level of consciousness: awake, alert  and oriented  Complications: No apparent anesthesia complications

## 2014-02-27 NOTE — Discharge Instructions (Signed)

## 2014-02-27 NOTE — Op Note (Signed)
Procedure: Left temporal artery biopsy  Preoperative diagnosis: Headaches  Postoperative diagnosis: Same  Anesthesia: Local with IV sedation  Specimens: Left temporal artery  Operative details: After obtaining informed consent, the patient was taken to the operating room. The patient was placed in supine position on the operating room table. Next Doppler was used to map out the course of the left superficial temporal artery. This area was prepped and draped in usual sterile fashion. Local anesthesia was infiltrated over this area. Skin incision was made over the area of the left superficial temporal artery. The temporal artery was dissected free circumferentially. This was ligated proximally and distally with 3-0 silk ties. The intervening segment of approximately 3 cm was excised completely and sent to pathology as a specimen. Hemostasis was obtained. The subcutaneous tissues reapproximated using a running 3-0 Vicryl suture. The skin was closed with 4 0 Vicryl subcuticular stitch. Dermabond was applied the skin incision. The patient tolerated procedure well and there were no complications. Instrument sponge and needle counts were correct at the end of the case. The patient was taken to the recovery room in stable condition.  Fabienne Bruns, MD [facility] [place] Office: [phone] Pager: [phone]

## 2014-02-27 NOTE — H&P (View-Only) (Signed)
[facility] [place] HISTORY AND PHYSICAL   History of Present Illness:  Patient is a 58 y.o. year old female who presents for evaluation of headaches and possible temporal arteritis. The patient has a history of chronic headaches. However in the last several weeks they have been much worse. This is primarily centered over the left temporal region. She states she has had some blurriness of her left thigh. She was started on steroids yesterday by Dr. Beverely Low.  Other medical problems include NASH cirrhosis awaiting liver transplant, hyperlipidemia, hypertension, bipolar disorder, celiac disease, depression, anxiety, gastric ulcer, diabetes, encephalopathy, Crohn's disease all of these are currently stable.    Past Medical History  Diagnosis Date  . Arthritis   . Allergic rhinitis   . Colitis   . Degenerative disc disease   . Hyperlipidemia   . Hypertension   . Celiac disease   . Bipolar disorder   . Macular degeneration   . Interstitial cystitis   . Heart murmur   . Anxiety   . Depression   . Liver disease   . Gastric ulcer   . TIA (transient ischemic attack) 06/2013; ?10/13/2013  . Type II diabetes mellitus   . Anemia, iron deficiency 10/30/2012  . History of blood transfusion     "maybe 3-4; related to low counts" (10/13/2013)  . Hepatic encephalopathy   . Crohn's disease   . Cirrhosis   . Stroke     Past Surgical History  Procedure Laterality Date  . Appendectomy    . Lumbar laminectomy    . Carpal tunnel release Bilateral   . Cesarean section  1983  . Cysto/ hod/ instillatio clorpactin  07-03-2008  &  06-09-2001    INTERSTITIAL CYSTITIS  . Left shoulder /   including capsulectomy with debridement/  sad with resection of ac Left 01-25-2008  . Right shoulder arthroscopy/ open resection distal clavicle/ debridement adhesive capsulitis/ rotator cuff repair Right 08-30-2003  . Tonsillectomy and adenoidectomy    . Transthoracic echocardiogram  03-28-2011     NORMAL LVSF/ EF 60-65%/ VERY MILD AORTIC STENOSIS, TRIVIAL REGURG./ MILDLY DILATED LEFT ATRIUM  . Cystoscopy  11/26/2011    Procedure: CYSTOSCOPY;  Surgeon: Valetta Fuller, MD;  Location: [facility];  Service: Urology;  Laterality: N/A;  clorpactin  in bladder  . 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 Cholecystectomy  12/2012  . Tubal ligation  1983  . Back surgery      Social History History  Substance Use Topics  . Smoking status: Never Smoker   . Smokeless tobacco: Never Used  . Alcohol Use: No    Family History Family History  Problem Relation Age of Onset  . Diabetes Mother   . Hypertension Mother   . Hyperlipidemia Mother   . Diabetes Brother   . Hypertension Brother   . Hyperlipidemia Brother   . Hypertension    . Colon cancer Neg Hx   . Deep vein thrombosis Father     Allergies  Allergies  Allergen Reactions  . Sulfonamide Derivatives     Classic angioedema reaction  . Ciprofloxacin     Mouth ulcers  . Wheat Bran     Celiac's disease     Current Outpatient Prescriptions  Medication Sig Dispense Refill  . amoxicillin (AMOXIL) 500 MG capsule Take 1 capsule (500 mg total)  by mouth 2 (two) times daily.  14 capsule  0  . benzonatate (TESSALON) 200 MG capsule Take 1 capsule (200 mg total) by mouth 3 (three) times daily as needed for cough.  60 capsule  0  . FLUoxetine (PROZAC) 40 MG capsule Take 40 mg by mouth every morning.       . furosemide (LASIX) 40 MG tablet Take 40 mg by mouth daily.      Marland Kitchen gabapentin (NEURONTIN) 600 MG tablet Take 600 mg by mouth 4 (four) times daily.      . iron polysaccharides (POLY-IRON 150) 150 MG capsule Take 1 capsule (150 mg total) by mouth 2 (two) times daily.  180 capsule  3  . lactulose (CHRONULAC) 10 GM/15ML solution Take by mouth.      . loratadine (CLARITIN) 10 MG tablet  Take 10 mg by mouth daily.      . mesalamine (LIALDA) 1.2 G EC tablet Take 1.2 g by mouth 2 (two) times daily.      . Multiple Vitamins-Minerals (MULTIVITAMIN WITH MINERALS) tablet Take 1 tablet by mouth daily.      . Nutritional Supplements (ENSURE PO) Take by mouth.      Marland Kitchen omeprazole (PRILOSEC) 40 MG capsule Take 40 mg by mouth daily.      . ondansetron (ZOFRAN) 4 MG tablet Take 1 tablet (4 mg total) by mouth every 8 (eight) hours as needed for nausea or vomiting.  20 tablet  0  . pentosan polysulfate (ELMIRON) 100 MG capsule Take 200 mg by mouth 2 (two) times daily.       . predniSONE (DELTASONE) 20 MG tablet Take 2 tablets by mouth for 10 days, then take 1 tablet by mouth for 5 days, then 1/2 tablet by mouth for 4 days.  27 tablet  0  . rifaximin (XIFAXAN) 550 MG TABS tablet Take 1 tablet (550 mg total) by mouth 2 (two) times daily.  60 tablet  11  . Saxagliptin-Metformin 10-998 MG TB24 Take 1 tablet by mouth daily.  30 tablet  6  . spironolactone (ALDACTONE) 50 MG tablet Take 50 mg by mouth daily.      . traZODone (DESYREL) 50 MG tablet Take 50 mg by mouth at bedtime.       No current facility-administered medications for this visit.    ROS:   General:  No weight loss, Fever, chills  HEENT: No recent headaches, no nasal bleeding, no visual changes, no sore throat  Neurologic: No dizziness, blackouts, seizures. No recent symptoms of stroke or mini- stroke. No recent episodes of slurred speech, or temporary blindness.  Cardiac: No recent episodes of chest pain/pressure, no shortness of breath at rest.  + shortness of breath with exertion.  Denies history of atrial fibrillation or irregular heartbeat  Vascular: No history of rest pain in feet.  No history of claudication.  No history of non-healing ulcer, No history of DVT   Pulmonary: No home oxygen, no productive cough, no hemoptysis,  No asthma or wheezing  Musculoskeletal:   Arthritis,  Low back pain,   Joint  pain  Hematologic:No history of hypercoagulable state.  No history of easy bleeding.  No history of anemia  Gastrointestinal: No hematochezia or melena,  No gastroesophageal reflux, no trouble swallowing  Urinary:  chronic Kidney disease,  on HD -  MWF or  TTHS,  Burning with urination,  Frequent urination,  Difficulty urinating;   Skin: No  rashes  Psychological: No history of anxiety,  No history of depression   Physical Examination  Filed Vitals:   02/23/14 1450 02/23/14 1454  BP: 125/53 117/47  Pulse: 71   Height: 5\' 4"  (1.626 m)   Weight: 137 lb (62.143 kg)   SpO2: 100%     Body mass index is 23.5 kg/(m^2).  General:  Alert and oriented, no acute distress HEENT: Normal, mild tenderness over the left temporal region Neck: No bruit or JVD Skin: No rash, spider angiomata over the face and chest Musculoskeletal: No deformity or edema  Neurologic: Upper and lower extremity motor 5/5 and symmetric  ASSESSMENT:  Headaches, possible temporal arteritis   PLAN:  Left temporal artery biopsy on Monday, August 31. Risks benefits possible complications and procedure details were explained the patient today. These include but are not limited to. Incisional numbness bleeding infection   Fabienne Bruns, MD [facility] [place] Office: [phone] Pager: [phone]

## 2014-02-28 ENCOUNTER — Encounter (HOSPITAL_COMMUNITY): Payer: Self-pay | Admitting: Vascular Surgery

## 2014-03-02 ENCOUNTER — Encounter: Payer: Self-pay | Admitting: Family Medicine

## 2014-03-03 ENCOUNTER — Encounter: Payer: Self-pay | Admitting: Family Medicine

## 2014-03-03 ENCOUNTER — Ambulatory Visit (INDEPENDENT_AMBULATORY_CARE_PROVIDER_SITE_OTHER): Payer: 59 | Admitting: Family Medicine

## 2014-03-03 VITALS — BP 130/80 | HR 76 | Temp 98.3°F | Resp 17 | Wt 135.5 lb

## 2014-03-03 DIAGNOSIS — J309 Allergic rhinitis, unspecified: Secondary | ICD-10-CM

## 2014-03-03 MED ORDER — FLUTICASONE PROPIONATE 50 MCG/ACT NA SUSP: 2.0000 | Freq: Every day | NASAL | Status: DC

## 2014-03-03 NOTE — Assessment & Plan Note (Signed)
Deteriorated.  Still suspect this is cause of pt's cough and nasal congestion.  Pt on OTC antihistamine daily.  Add nasal steroid for daily use- reviewed proper technique.  Add Mucinex DM- will refrain from codeine or phenergan based syrups due to liver dysfunction due to risk of excessive sedation.  Reviewed supportive care and red flags that should prompt return.  Pt expressed understanding and is in agreement w/ plan.

## 2014-03-03 NOTE — Progress Notes (Signed)
Pre visit review using our clinic review tool, if applicable. No additional management support is needed unless otherwise documented below in the visit note. 

## 2014-03-03 NOTE — Patient Instructions (Signed)
Follow up as needed Continue the Claritin daily Add the Flonase- 2 sprays each nostril Add Mucinex DM in addition to tessalon for cough Drink plenty of fluids REST! Call if no improvement by next week Hang in there! Happy Labor Day!

## 2014-03-03 NOTE — Telephone Encounter (Signed)
Pt was scheduled for 9/4 at 1pm for an acute.

## 2014-03-03 NOTE — Progress Notes (Signed)
   Subjective:    Patient ID: Sabrina [name], female    DOB: [birth year], 58 y.o.   MRN: [id number]  Cough Associated symptoms include ear pain.  Epistaxis   Otalgia  Associated symptoms include coughing.   Cough- pt reports cough is productive of 'white mucous-y stuffy' this AM but throughout the day is dry.  Taking Tessalon w/o relief.  Cough is keeping her awake at night.  No fever.  Taking Claritin daily.  Not using nasal steroid.  Feels well w/ exception of cough   Review of Systems  HENT: Positive for ear pain and nosebleeds.   Respiratory: Positive for cough.    For ROS see HPI     Objective:   Physical Exam  Vitals reviewed. Constitutional: She appears well-developed and well-nourished. No distress.  HENT:  Head: Normocephalic and atraumatic.  Right Ear: Tympanic membrane normal.  Left Ear: Tympanic membrane normal.  Nose: Mucosal edema and rhinorrhea present. Right sinus exhibits no maxillary sinus tenderness and no frontal sinus tenderness. Left sinus exhibits no maxillary sinus tenderness and no frontal sinus tenderness.  Mouth/Throat: Mucous membranes are normal. Posterior oropharyngeal erythema (w/ PND) present.  Dry blood in R nostril  Eyes: Conjunctivae and EOM are normal. Pupils are equal, round, and reactive to light.  Neck: Normal range of motion. Neck supple.  Cardiovascular: Normal rate, regular rhythm and normal heart sounds.   Pulmonary/Chest: Effort normal and breath sounds normal. No respiratory distress. She has no wheezes. She has no rales.  Dry cough  Lymphadenopathy:    She has no cervical adenopathy.          Assessment & Plan:

## 2014-03-07 ENCOUNTER — Encounter: Payer: Self-pay | Admitting: Family Medicine

## 2014-03-07 MED ORDER — AZITHROMYCIN 250 MG PO TABS: ORAL_TABLET | ORAL | Status: DC

## 2014-03-08 ENCOUNTER — Other Ambulatory Visit: Payer: Self-pay | Admitting: General Practice

## 2014-03-08 MED ORDER — BENZONATATE 200 MG PO CAPS: 200.0000 mg | ORAL_CAPSULE | Freq: Three times a day (TID) | ORAL | Status: DC | PRN

## 2014-03-09 ENCOUNTER — Encounter: Payer: Self-pay | Admitting: Family Medicine

## 2014-03-10 ENCOUNTER — Emergency Department (HOSPITAL_COMMUNITY): Payer: 59

## 2014-03-10 ENCOUNTER — Encounter (HOSPITAL_COMMUNITY): Payer: Self-pay | Admitting: Emergency Medicine

## 2014-03-10 ENCOUNTER — Telehealth: Payer: Self-pay | Admitting: Family Medicine

## 2014-03-10 ENCOUNTER — Emergency Department (HOSPITAL_COMMUNITY)
Admission: EM | Admit: 2014-03-10 | Discharge: 2014-03-10 | Disposition: A | Discharge status: Home / Self Care | Payer: 59 | Attending: Emergency Medicine | Admitting: Emergency Medicine

## 2014-03-10 DIAGNOSIS — D509 Iron deficiency anemia, unspecified: Secondary | ICD-10-CM | POA: Insufficient documentation

## 2014-03-10 DIAGNOSIS — F319 Bipolar disorder, unspecified: Secondary | ICD-10-CM | POA: Diagnosis not present

## 2014-03-10 DIAGNOSIS — R059 Cough, unspecified: Secondary | ICD-10-CM | POA: Diagnosis present

## 2014-03-10 DIAGNOSIS — I10 Essential (primary) hypertension: Secondary | ICD-10-CM | POA: Insufficient documentation

## 2014-03-10 DIAGNOSIS — Z8739 Personal history of other diseases of the musculoskeletal system and connective tissue: Secondary | ICD-10-CM | POA: Diagnosis not present

## 2014-03-10 DIAGNOSIS — Z792 Long term (current) use of antibiotics: Secondary | ICD-10-CM | POA: Diagnosis not present

## 2014-03-10 DIAGNOSIS — Z79899 Other long term (current) drug therapy: Secondary | ICD-10-CM | POA: Insufficient documentation

## 2014-03-10 DIAGNOSIS — F411 Generalized anxiety disorder: Secondary | ICD-10-CM | POA: Insufficient documentation

## 2014-03-10 DIAGNOSIS — J029 Acute pharyngitis, unspecified: Secondary | ICD-10-CM | POA: Insufficient documentation

## 2014-03-10 DIAGNOSIS — K5090 Crohn's disease, unspecified, without complications: Secondary | ICD-10-CM | POA: Insufficient documentation

## 2014-03-10 DIAGNOSIS — R011 Cardiac murmur, unspecified: Secondary | ICD-10-CM | POA: Diagnosis not present

## 2014-03-10 DIAGNOSIS — J90 Pleural effusion, not elsewhere classified: Secondary | ICD-10-CM

## 2014-03-10 DIAGNOSIS — IMO0002 Reserved for concepts with insufficient information to code with codable children: Secondary | ICD-10-CM | POA: Diagnosis not present

## 2014-03-10 DIAGNOSIS — E119 Type 2 diabetes mellitus without complications: Secondary | ICD-10-CM | POA: Insufficient documentation

## 2014-03-10 DIAGNOSIS — Z8669 Personal history of other diseases of the nervous system and sense organs: Secondary | ICD-10-CM | POA: Diagnosis not present

## 2014-03-10 DIAGNOSIS — Z8673 Personal history of transient ischemic attack (TIA), and cerebral infarction without residual deficits: Secondary | ICD-10-CM | POA: Diagnosis not present

## 2014-03-10 DIAGNOSIS — Z87448 Personal history of other diseases of urinary system: Secondary | ICD-10-CM | POA: Insufficient documentation

## 2014-03-10 DIAGNOSIS — R05 Cough: Secondary | ICD-10-CM | POA: Diagnosis present

## 2014-03-10 LAB — BASIC METABOLIC PANEL
Anion gap: 12 (ref 5–15)
BUN: 10 mg/dL (ref 6–23)
CALCIUM: 8.6 mg/dL (ref 8.4–10.5)
CO2: 22 mEq/L (ref 19–32)
Chloride: 101 mEq/L (ref 96–112)
Creatinine, Ser: 0.59 mg/dL (ref 0.50–1.10)
GFR calc Af Amer: >90 mL/min (ref >90)
GLUCOSE: 228 mg/dL — AB (ref 70–99)
Potassium: 4.3 mEq/L (ref 3.7–5.3)
Sodium: 135 mEq/L — ABNORMAL LOW (ref 137–147)

## 2014-03-10 LAB — CBC
HCT: 34.4 % — ABNORMAL LOW (ref 36.0–46.0)
Hemoglobin: 12.7 g/dL (ref 12.0–15.0)
MCH: 34.7 pg — ABNORMAL HIGH (ref 26.0–34.0)
MCHC: 36.9 g/dL — ABNORMAL HIGH (ref 30.0–36.0)
MCV: 94 fL (ref 78.0–100.0)
PLATELETS: 149 10*3/uL — AB (ref 150–400)
RBC: 3.66 MIL/uL — ABNORMAL LOW (ref 3.87–5.11)
RDW: 18 % — AB (ref 11.5–15.5)
WBC: 12 10*3/uL — ABNORMAL HIGH (ref 4.0–10.5)

## 2014-03-10 MED ORDER — IOHEXOL 300 MG/ML  SOLN
80.0000 mL | Freq: Once | INTRAMUSCULAR | Status: AC | PRN
  Administered 2014-03-10: 80 mL via INTRAVENOUS

## 2014-03-10 MED ORDER — OXYCODONE HCL 5 MG PO TABS
5.0000 mg | ORAL_TABLET | Freq: Once | ORAL | Status: AC
  Administered 2014-03-10: 5 mg via ORAL
  Filled 2014-03-10: qty 1

## 2014-03-10 MED ORDER — OXYCODONE HCL 5 MG PO TABS
5.0000 mg | ORAL_TABLET | ORAL | Status: DC | PRN
Start: 2014-03-10 — End: 2014-03-16

## 2014-03-10 MED ORDER — DOXYCYCLINE HYCLATE 50 MG PO CAPS: 50.0000 mg | ORAL_CAPSULE | Freq: Two times a day (BID) | ORAL | Status: AC

## 2014-03-10 NOTE — ED Notes (Signed)
CT staff at bedside attempting IV insertion for CT chest with contrast, pt updated on plan of care

## 2014-03-10 NOTE — ED Notes (Addendum)
Shes had a cough with white sputum x weeks. Her PCP has treated her with "several medications including antibiotics" with no relief. She states she can  Not sleep due to the cough. She started to have facial and bilateral ear pain and noticed an ulcer in her mouth also. SHE IS Currently awaiting a liver transplant

## 2014-03-10 NOTE — ED Notes (Signed)
While ambulating, pt  Maintained O2 sats between 97-100%. Pt's gait was unsteady, but pt states that this is normal for her, and that she usually walks with a walker. No SOB noted with ambulation.

## 2014-03-10 NOTE — Telephone Encounter (Signed)
Pt states she sent dr. Beverely Low a message through my-chart and wants to make her aware that she will go to the emergency room today.

## 2014-03-10 NOTE — ED Notes (Signed)
Per Edman Circle, MD pt given a sprite.

## 2014-03-10 NOTE — ED Provider Notes (Signed)
Pt reports increasing cough for several weeks and not improving, feels like she is "losing her air" She has pleural effusion which she was unaware of having previously She also reports left ear pain/headache for several days Will get CT head for HA and will get CT chest for worsening cough and h/o pleural effusion Will attempt to contact PCP for followup  Joya Gaskins, MD 03/10/14 1621

## 2014-03-10 NOTE — ED Notes (Signed)
CT staff unable to obtain access

## 2014-03-10 NOTE — ED Notes (Signed)
IV team aware of need for IV 

## 2014-03-10 NOTE — Telephone Encounter (Signed)
Noted  

## 2014-03-10 NOTE — ED Notes (Signed)
Attempted to start IV, unsuccessful attempt, Stengel MD advised not necessary for plan of care at this time.

## 2014-03-10 NOTE — ED Provider Notes (Signed)
D/w dr Beverely Low Will see in office next week for further evaluation of pleural effusion   Joya Gaskins, MD 03/10/14 [phone]

## 2014-03-10 NOTE — Discharge Instructions (Signed)
Cough, Adult  A cough is a reflex that helps clear your throat and airways. It can help heal the body or may be a reaction to an irritated airway. A cough may only last 2 or 3 weeks (acute) or may last more than 8 weeks (chronic).  CAUSES Acute cough:  Viral or bacterial infections. Chronic cough:  Infections.  Allergies.  Asthma.  Post-nasal drip.  Smoking.  Heartburn or acid reflux.  Some medicines.  Chronic lung problems (COPD).  Cancer. SYMPTOMS   Cough.  Fever.  Chest pain.  Increased breathing rate.  High-pitched whistling sound when breathing (wheezing).  Colored mucus that you cough up (sputum). TREATMENT   A bacterial cough may be treated with antibiotic medicine.  A viral cough must run its course and will not respond to antibiotics.  Your caregiver may recommend other treatments if you have a chronic cough. HOME CARE INSTRUCTIONS   Only take over-the-counter or prescription medicines for pain, discomfort, or fever as directed by your caregiver. Use cough suppressants only as directed by your caregiver.  Use a cold steam vaporizer or humidifier in your bedroom or home to help loosen secretions.  Sleep in a semi-upright position if your cough is worse at night.  Rest as needed.  Stop smoking if you smoke. SEEK IMMEDIATE MEDICAL CARE IF:   You have pus in your sputum.  Your cough starts to worsen.  You cannot control your cough with suppressants and are losing sleep.  You begin coughing up blood.  You have difficulty breathing.  You develop pain which is getting worse or is uncontrolled with medicine.  You have a fever. MAKE SURE YOU:   Understand these instructions.  Will watch your condition.  Will get help right away if you are not doing well or get worse. Document Released: 12/13/2010 Document Revised: 09/08/2011 Document Reviewed: 12/13/2010 Exit[employer] Patient Information 2015 [employer]. This information is not intended  to replace advice given to you by your health care provider. Make sure you discuss any questions you have with your health care provider.  

## 2014-03-10 NOTE — ED Provider Notes (Signed)
CSN: [id number]     Arrival date & time 03/10/14  1119 History   None    Chief Complaint  Patient presents with  . Cough    Patient is a 58 y.o. female presenting with cough. The history is provided by the patient.  Cough Cough characteristics:  Non-productive (sometimes white sputum) Severity:  Moderate Onset quality:  Gradual Duration:  3 weeks Timing:  Intermittent Progression:  Unchanged Chronicity:  New Smoker: no   Context: not occupational exposure and not sick contacts   Relieved by:  Nothing Worsened by:  Nothing tried Ineffective treatments: zpack, tessalon. Associated symptoms: ear fullness (left), ear pain (left), rhinorrhea and sore throat (after coughing)   Associated symptoms: no chest pain, no chills, no diaphoresis, no fever, no headaches, no rash, no shortness of breath and no wheezing   Risk factors: no recent travel    Seen PCP many times. Told she has bronchitis. Ear pain for past week and PCP said exam was normal.  Not immunosuppressed.  On liver [facility] list at [facility] for nonETOH cirrhosis.  Recent temporal biopsy for suspected GCA was negative. ESR has been elevated.    Past Medical History  Diagnosis Date  . Arthritis   . Allergic rhinitis   . Colitis   . Degenerative disc disease   . Hyperlipidemia   . Hypertension   . Celiac disease   . Bipolar disorder   . Macular degeneration   . Interstitial cystitis   . Heart murmur   . Anxiety   . Depression   . Liver disease   . Gastric ulcer   . TIA (transient ischemic attack) 06/2013; ?10/13/2013  . Type II diabetes mellitus   . Anemia, iron deficiency 10/30/2012  . History of blood transfusion     "maybe 3-4; related to low counts" (10/13/2013)  . Hepatic encephalopathy   . Crohn's disease   . Cirrhosis   . Stroke   . Bronchitis 02/22/2014    seen by Dr. Birdie Riddle, treated /w antibiotic  but as of 02/27/2014- sympytoms have resolved.    Past Surgical History  Procedure Laterality Date  .  Appendectomy    . Lumbar laminectomy    . Carpal tunnel release Bilateral   . Cesarean section  1983  . Cysto/ hod/ instillatio clorpactin  07-03-2008  &  06-09-2001    INTERSTITIAL CYSTITIS  . Left shoulder /   including capsulectomy with debridement/  sad with resection of ac Left 01-25-2008  . Right shoulder arthroscopy/ open resection distal clavicle/ debridement adhesive capsulitis/ rotator cuff repair Right 08-30-2003  . Tonsillectomy and adenoidectomy    . Transthoracic echocardiogram  03-28-2011    NORMAL LVSF/ EF 60-65%/ VERY MILD AORTIC STENOSIS, TRIVIAL REGURG./ MILDLY DILATED LEFT ATRIUM  . Cystoscopy  11/26/2011    Procedure: CYSTOSCOPY;  Surgeon: Bernestine Amass, MD;  Location[facility];  Service: Urology;  Laterality: N/A;  clorpactin  in bladder  . 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 Cholecystectomy  12/2012  . Tubal ligation  1983  . Back surgery    . Artery biopsy Left 02/27/2014    Procedure: BIOPSY TEMPORAL ARTERY;  Surgeon: Elam Dutch, MD;  Location: [facility];  Service: Vascular;  Laterality: Left;   Family History  Problem Relation Age of Onset  . Diabetes Mother   . Hypertension  Mother   . Hyperlipidemia Mother   . Diabetes Brother   . Hypertension Brother   . Hyperlipidemia Brother   . Hypertension    . Colon cancer Neg Hx   . Deep vein thrombosis Father    History  Substance Use Topics  . Smoking status: Never Smoker   . Smokeless tobacco: Never Used  . Alcohol Use: No   OB History   Grav Para Term Preterm Abortions TAB SAB Ect Mult Living                 Review of Systems  Constitutional: Negative for fever, chills and diaphoresis.  HENT: Positive for congestion, ear pain (left), postnasal drip, rhinorrhea and sore throat (after coughing).   Respiratory: Positive for cough. Negative for  shortness of breath and wheezing.   Cardiovascular: Negative for chest pain.  Gastrointestinal: Negative for nausea and vomiting.  Skin: Negative for rash.  Neurological: Negative for headaches.  All other systems reviewed and are negative.    Allergies  Sulfonamide derivatives; Ciprofloxacin; and Wheat bran  Home Medications   Prior to Admission medications   Medication Sig Start Date End Date Taking? Authorizing Provider  azithromycin (ZITHROMAX) 250 MG tablet 2 tabs on day 1, 1 tab on day 2-5 03/07/14  Yes Sheliah Hatch, MD  benzonatate (TESSALON) 200 MG capsule Take 1 capsule (200 mg total) by mouth 3 (three) times daily as needed for cough. 03/08/14  Yes Sheliah Hatch, MD  FLUoxetine (PROZAC) 40 MG capsule Take 40 mg by mouth every morning.    Yes Historical Provider, MD  fluticasone (FLONASE) 50 MCG/ACT nasal spray Place 2 sprays into both nostrils daily. 03/03/14  Yes Sheliah Hatch, MD  furosemide (LASIX) 40 MG tablet Take 40 mg by mouth daily.   Yes Historical Provider, MD  gabapentin (NEURONTIN) 600 MG tablet Take 600 mg by mouth 4 (four) times daily.   Yes Historical Provider, MD  iron polysaccharides (POLY-IRON 150) 150 MG capsule Take 1 capsule (150 mg total) by mouth 2 (two) times daily. 09/05/13  Yes Hilarie Fredrickson, MD  lactulose (CHRONULAC) 10 GM/15ML solution Take 30 g by mouth See admin instructions. May take up to 4 times daily. Patient states she usually takes once to twice daily.   Yes Historical Provider, MD  loratadine (CLARITIN) 10 MG tablet Take 10 mg by mouth daily.   Yes Historical Provider, MD  mesalamine (LIALDA) 1.2 G EC tablet Take 1.2 g by mouth 2 (two) times daily.   Yes Historical Provider, MD  Multiple Vitamins-Minerals (MULTIVITAMIN WITH MINERALS) tablet Take 1 tablet by mouth daily.   Yes Historical Provider, MD  Nutritional Supplements (ENSURE PO) Take 237 mLs by mouth 2 (two) times daily between meals.    Yes Historical Provider, MD  omeprazole  (PRILOSEC) 40 MG capsule Take 40 mg by mouth at bedtime.    Yes Historical Provider, MD  pentosan polysulfate (ELMIRON) 100 MG capsule Take 200 mg by mouth 2 (two) times daily.    Yes Historical Provider, MD  rifaximin (XIFAXAN) 550 MG TABS tablet Take 1 tablet (550 mg total) by mouth 2 (two) times daily. 10/27/13  Yes Amy S Esterwood, PA-C  Saxagliptin-Metformin 10-998 MG TB24 Take 1 tablet by mouth daily. 02/09/14  Yes Sheliah Hatch, MD  spironolactone (ALDACTONE) 50 MG tablet Take 50 mg by mouth daily.   Yes Historical Provider, MD  traMADol (ULTRAM) 50 MG tablet Take 1 tablet (50 mg total) by mouth every  6 (six) hours as needed. 02/27/14  Yes Samantha J Rhyne, PA-C  traZODone (DESYREL) 50 MG tablet Take 50 mg by mouth at bedtime.   Yes Historical Provider, MD   BP 119/56  Pulse 76  Temp(Src) 98 F (36.7 C) (Oral)  Resp 18  Ht $R'5\' 4"'HW$  (1.626 m)  Wt 134 lb (60.782 kg)  BMI 22.99 kg/m2  SpO2 100% Physical Exam  Nursing note and vitals reviewed. Constitutional: She is oriented to person, place, and time. She appears well-developed and well-nourished. No distress.  HENT:  Head: Normocephalic and atraumatic.  Nose: Nose normal.  Oropharynx with ertyhema no exudates, + post nasal drip.  Turbinates erythematous.  TMs clear, landmarks noted, no canal erythema. + light reflex  Eyes: Conjunctivae are normal. No scleral icterus.  Neck: Normal range of motion. Neck supple. No tracheal deviation present.  Cardiovascular: Normal rate and regular rhythm.   Murmur (3/6 systolic) heard. Pulmonary/Chest: Effort normal. No respiratory distress. She has no wheezes. She has no rales. She exhibits no tenderness.  Decreased breath sounds ar R base  Abdominal: Soft. Bowel sounds are normal. She exhibits no distension and no mass. There is no tenderness.  Musculoskeletal: Normal range of motion. She exhibits no edema and no tenderness.  No lower extremity edema, erythema or warmth  Neurological: She is  alert and oriented to person, place, and time. Coordination normal.  Skin: Skin is warm and dry. No rash noted.  Psychiatric: She has a normal mood and affect.    ED Course  Procedures (including critical care time) Labs Review Labs Reviewed  BASIC METABOLIC PANEL - Abnormal; Notable for the following:    Sodium 135 (*)    Glucose, Bld 228 (*)    All other components within normal limits  CBC - Abnormal; Notable for the following:    WBC 12.0 (*)    RBC 3.66 (*)    HCT 34.4 (*)    MCH 34.7 (*)    MCHC 36.9 (*)    RDW 18.0 (*)    Platelets 149 (*)    All other components within normal limits    Imaging Review Dg Chest 2 View (if Patient Has Fever And/or Copd)  03/10/2014   CLINICAL DATA:  Shortness of breath and cough for 3 weeks, history diabetes, cirrhosis, Crohn's disease, hypertension, stroke  EXAM: CHEST  2 VIEW  COMPARISON:  02/27/2014  FINDINGS: Upper normal heart size.  Normal mediastinal contours and pulmonary vascularity.  Moderate RIGHT pleural effusion and basilar atelectasis, unchanged.  Remaining lungs clear.  Persistent central peribronchial thickening.  No pneumothorax or acute osseous findings.  Surgical clips in upper abdomen bilaterally.  IMPRESSION: Persistent moderate RIGHT pleural effusion and basilar atelectasis.  Mild bronchitic changes.   Electronically Signed   By: Lavonia Dana M.D.   On: 03/10/2014 12:33   Ct Head Wo Contrast  03/10/2014   CLINICAL DATA:  58 year old female with left side headache and ear pain. History of temporal artery biopsy.  EXAM: CT HEAD WITHOUT CONTRAST  TECHNIQUE: Contiguous axial images were obtained from the base of the skull through the vertex without intravenous contrast.  COMPARISON:  10/13/2013 CT and MRI and prior studies.  FINDINGS: Mild generalized cerebral volume loss again noted.  No acute intracranial abnormalities are identified, including mass lesion or mass effect, hydrocephalus, extra-axial fluid collection, midline shift,  hemorrhage, or acute infarction.  The visualized bony calvarium is unremarkable.  The mastoid air cells and middle/ inner ears are clear.  IMPRESSION:  No evidence of acute abnormality.  Mild atrophy.   Electronically Signed   By: Hassan Rowan M.D.   On: 03/10/2014 19:09   Ct Chest W Contrast  03/10/2014   CLINICAL DATA:  Cough.  EXAM: CT CHEST WITH CONTRAST  TECHNIQUE: Multidetector CT imaging of the chest was performed during intravenous contrast administration.  CONTRAST:  28mL OMNIPAQUE IOHEXOL 300 MG/ML  SOLN  COMPARISON:  Chest CT 12/30/2012.  FINDINGS: Mediastinum: Heart size is normal. There is no significant pericardial fluid, thickening or pericardial calcification. There is atherosclerosis of the thoracic aorta, the great vessels of the mediastinum and the coronary arteries, including calcified atherosclerotic plaque in the left anterior descending and right coronary arteries. Separate origin of the left vertebral artery directly off the aortic arch (normal anatomical variant) incidentally noted. No pathologically enlarged mediastinal or hilar lymph nodes. Esophagus is unremarkable in appearance.  Lungs/Pleura: New large right pleural effusion with extensive passive atelectasis of the majority of the basal segments of the right lower lobe, and some posterior aspects of the right middle lobe. Patchy areas of ground-glass attenuation in the lungs bilaterally, particularly in the anterior aspect of the left upper lobe are nonspecific. No confluent consolidative airspace disease. No definite suspicious appearing pulmonary nodule or mass.  Upper Abdomen: The liver has a shrunken appearance and mildly nodular contour, indicative of cirrhosis. Status post splenectomy. Significant volume of ascites noted in the visualized upper abdomen.  Musculoskeletal: There are no aggressive appearing lytic or blastic lesions noted in the visualized portions of the skeleton.  IMPRESSION: 1. Large right pleural effusion is new  compared to the prior examination, but simple in appearance layering dependently with extensive atelectasis in the right lung, as detailed above. 2. Patchy areas of nonspecific ground-glass attenuation in the lungs bilaterally, particularly in the anterior aspect of the left upper lobe. These are favored to be of infectious or inflammatory etiology. 3. Stigmata of cirrhosis with ascites incompletely visualized. 4. Status post splenectomy. 5. Atherosclerosis, including 2 vessel coronary artery disease. Please note that although the presence of coronary artery calcium documents the presence of coronary artery disease, the severity of this disease and any potential stenosis cannot be assessed on this non-gated CT examination. Assessment for potential risk factor modification, dietary therapy or pharmacologic therapy may be warranted, if clinically indicated.   Electronically Signed   By: Vinnie Langton M.D.   On: 03/10/2014 19:14     EKG Interpretation None      MDM   Final diagnoses:  Cough  Pleural effusion    Pt very tearful. Frustrated she still has symptoms and has seen PCP multiple times. Well appearing otherwise. VSS.  No hypoxia. No resp distress.  Source of ear pain not entirely clear, exam normal. May be related to congestion. Pt is adamant there must be something wrong inside head to cause ear pain and is not satisfied with current treatment plan.  CT head negative.   Etiology of cough broad.  May be viral and within expected course for viral illness. No signs of pharyngitis.  CT ordered after discussion with PCP and pt wishes. R pleural effusion appears simple and was present for past few months, suspect from liver disease.  Multiple areas at periphery of lung with opacities, will switch abx to doxy for possible PNA. Sx treated with roxycodone with some relief.  Pt continues to question result of imaging studies.   Pt will f.u with PCP within week if sx persist.    Ronalee Belts  Henry Russel,  MD 03/11/14 1505

## 2014-03-13 ENCOUNTER — Telehealth: Payer: Self-pay | Admitting: Pulmonary Disease

## 2014-03-13 ENCOUNTER — Other Ambulatory Visit: Payer: Self-pay | Admitting: Family Medicine

## 2014-03-13 DIAGNOSIS — J90 Pleural effusion, not elsewhere classified: Secondary | ICD-10-CM

## 2014-03-13 NOTE — ED Provider Notes (Signed)
I have personally seen and examined the patient.  I have discussed the plan of care with the resident.  I have reviewed the documentation on PMH/FH/Soc. History.  I have reviewed the documentation of the resident and agree.   Joya Gaskins, MD 03/13/14 [phone]

## 2014-03-13 NOTE — Telephone Encounter (Signed)
Sorry 9/23 @ 8:45 was the eariliest one we had for a consult.Caren Griffins

## 2014-03-13 NOTE — Telephone Encounter (Signed)
Called and spoke with [facility] from Dr. Beverely Low office and she stated that the pt will need to be seen before 9/23 with MW.  She stated that the pt is not breathing very well and needs to be seen for consult this week.  RA please advise if we can overbook you on Thursday in [place].  Thanks  Allergies  Allergen Reactions  . Sulfonamide Derivatives     Classic angioedema reaction  . Ciprofloxacin     Mouth ulcers  . Wheat Bran     Celiac's disease    Current Outpatient Prescriptions on File Prior to Visit  Medication Sig Dispense Refill  . benzonatate (TESSALON) 200 MG capsule Take 1 capsule (200 mg total) by mouth 3 (three) times daily as needed for cough.  60 capsule  0  . doxycycline (VIBRAMYCIN) 50 MG capsule Take 1 capsule (50 mg total) by mouth 2 (two) times daily.  14 capsule  0  . FLUoxetine (PROZAC) 40 MG capsule Take 40 mg by mouth every morning.       . fluticasone (FLONASE) 50 MCG/ACT nasal spray Place 2 sprays into both nostrils daily.  16 g  6  . furosemide (LASIX) 40 MG tablet Take 40 mg by mouth daily.      Marland Kitchen gabapentin (NEURONTIN) 600 MG tablet Take 600 mg by mouth 4 (four) times daily.      . iron polysaccharides (POLY-IRON 150) 150 MG capsule Take 1 capsule (150 mg total) by mouth 2 (two) times daily.  180 capsule  3  . lactulose (CHRONULAC) 10 GM/15ML solution Take 30 g by mouth See admin instructions. May take up to 4 times daily. Patient states she usually takes once to twice daily.      Marland Kitchen loratadine (CLARITIN) 10 MG tablet Take 10 mg by mouth daily.      . mesalamine (LIALDA) 1.2 G EC tablet Take 1.2 g by mouth 2 (two) times daily.      . Multiple Vitamins-Minerals (MULTIVITAMIN WITH MINERALS) tablet Take 1 tablet by mouth daily.      . Nutritional Supplements (ENSURE PO) Take 237 mLs by mouth 2 (two) times daily between meals.       Marland Kitchen omeprazole (PRILOSEC) 40 MG capsule Take 40 mg by mouth at bedtime.       Marland Kitchen oxyCODONE (ROXICODONE) 5 MG immediate release tablet Take 1 tablet  (5 mg total) by mouth every 4 (four) hours as needed for severe pain.  12 tablet  0  . pentosan polysulfate (ELMIRON) 100 MG capsule Take 200 mg by mouth 2 (two) times daily.       . rifaximin (XIFAXAN) 550 MG TABS tablet Take 1 tablet (550 mg total) by mouth 2 (two) times daily.  60 tablet  11  . Saxagliptin-Metformin 10-998 MG TB24 Take 1 tablet by mouth daily.  30 tablet  6  . spironolactone (ALDACTONE) 50 MG tablet Take 50 mg by mouth daily.      . traMADol (ULTRAM) 50 MG tablet Take 1 tablet (50 mg total) by mouth every 6 (six) hours as needed.  15 tablet  0  . traZODone (DESYREL) 50 MG tablet Take 50 mg by mouth at bedtime.       No current facility-administered medications on file prior to visit.

## 2014-03-15 ENCOUNTER — Ambulatory Visit (INDEPENDENT_AMBULATORY_CARE_PROVIDER_SITE_OTHER): Payer: 59 | Admitting: Family Medicine

## 2014-03-15 ENCOUNTER — Encounter: Payer: Self-pay | Admitting: Family Medicine

## 2014-03-15 VITALS — BP 130/80 | HR 80 | Temp 98.5°F | Resp 17 | Wt 129.5 lb

## 2014-03-15 DIAGNOSIS — G500 Trigeminal neuralgia: Secondary | ICD-10-CM

## 2014-03-15 DIAGNOSIS — J90 Pleural effusion, not elsewhere classified: Secondary | ICD-10-CM

## 2014-03-15 MED ORDER — CARBAMAZEPINE 200 MG PO TABS: ORAL_TABLET | ORAL | Status: DC

## 2014-03-15 NOTE — Patient Instructions (Signed)
We'll notify you of your pulmonary appt Start the Carbamazepine tonight- get both doses starting tomorrow If no improvement in pain on 1 tab twice daily, increase to 2 tabs twice daily after 3 days We'll call you with your neuro appt Call with any questions or concerns Hang in there!

## 2014-03-15 NOTE — Progress Notes (Signed)
Pre visit review using our clinic review tool, if applicable. No additional management support is needed unless otherwise documented below in the visit note. 

## 2014-03-15 NOTE — Progress Notes (Signed)
   Subjective:    Patient ID: Sabrina [name], female    DOB: 1955/10/30, 58 y.o.   MRN: [id number]  HPI ER f/u- pt went to ER on 9/11 for severe ER pain in L ear and persistant cough.  Pt had normal head CT.  Chest CT showed simple, but large effusion on R.  Possible infectious process going on- switched to Doxy.  Prior to receiving oxycodone, pt was not able to sleep x4 days.  Pt has slept since starting pain meds but pain has not improved.  Cough makes pain 'excrutiating'.  Cough meds are not helping.   Review of Systems For ROS see HPI     Objective:   Physical Exam  Vitals reviewed. Constitutional: She is oriented to person, place, and time. She appears well-developed and well-nourished.  Obviously uncomfortable  HENT:  Head: Normocephalic and atraumatic.  Mouth/Throat: Oropharynx is clear and moist.  TTP over L anterior scalp TMs WNL bilaterally  Eyes: Conjunctivae and EOM are normal. Pupils are equal, round, and reactive to light.  Neck: Neck supple.  Pulmonary/Chest: Effort normal. No respiratory distress. She has no wheezes. She has no rales.  Decreased BS at R base Hacking cough  Lymphadenopathy:    She has no cervical adenopathy.  Neurological: She is alert and oriented to person, place, and time. No cranial nerve deficit. Coordination normal.  Skin: Skin is warm and dry.          Assessment & Plan:

## 2014-03-15 NOTE — Telephone Encounter (Signed)
Spoke with RA, okay to double-book tomorrow 9/17 in [facility] office at 2pm.  Please make sure pt is aware she may have to wait a little bit  Called spoke with [facility] w/ Dr Rennis Golden office and informed her of the above.  She voiced her understanding.  Appt scheduled, nothing further needed. Will sign off.

## 2014-03-15 NOTE — Telephone Encounter (Signed)
Jenn with dr tabori's office has not heard anything about this appt. They need this schd tomorrow. Call jenn at [phone]

## 2014-03-16 ENCOUNTER — Ambulatory Visit (INDEPENDENT_AMBULATORY_CARE_PROVIDER_SITE_OTHER): Payer: 59 | Admitting: Pulmonary Disease

## 2014-03-16 ENCOUNTER — Encounter: Payer: Self-pay | Admitting: Pulmonary Disease

## 2014-03-16 VITALS — BP 132/77 | HR 75 | Temp 97.8°F | Ht 64.0 in | Wt 129.0 lb

## 2014-03-16 DIAGNOSIS — J40 Bronchitis, not specified as acute or chronic: Secondary | ICD-10-CM

## 2014-03-16 DIAGNOSIS — J90 Pleural effusion, not elsewhere classified: Secondary | ICD-10-CM

## 2014-03-16 NOTE — Progress Notes (Signed)
Subjective:    Patient ID: Sabrina [name], female    DOB: February 04, 1956, 58 y.o.   MRN: [id number]  HPI 58 year old never smoker referred for evaluation of cough and right pleural effusion. She is accompanied by her husband, Sabrina [name]. She presented with pancytopenia and splenomegaly in 2014, underwent splenectomy in 01/2013 and was diagnosed with nonalcoholic cirrhosis (perry). She underwent bone marrow biopsy in 12/2012. She also has been diagnosed with Crohn's and celiac disease  Ascites was noted to be present, but she has never undergone a paracentesis. She had a good response to Lasix and Aldactone. She underwent a transplant evaluation and stopped taking diuretics in 01/2014. I note a CT abdomen on 12/30/13 and again on 02/02/14 that shows moderate ascites. She reports constant congestion, and sinus drip. She developed an episode of bronchitis and has received 3 antibiotic courses over the last 6 weeks. She was also given Tessalon Perles which did not seem to help. CT chest was performed on 03/10/14 during an ER visit which showed a new right pleural effusion. This was also noted on chest x-ray on 8/31 but not present in 10/13/13. She is very concerned about this effusion. She denies weight loss, does admit to abdominal distention, no pedal edema. No history of dyspnea, orthopnea or paroxysmal nocturnal dyspnea, She denies reflux symptoms, or significant environmental exposures. She has a 1 daughter and 2 cats at home. 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 Arthritis   . Allergic rhinitis   . Colitis   . Degenerative disc disease   . Hyperlipidemia   . Hypertension   . Celiac disease   . Bipolar disorder   . Macular degeneration   . Interstitial cystitis   . Heart murmur   . Anxiety   . Depression   . Liver disease   . Gastric ulcer   . TIA (transient  ischemic attack) 06/2013; ?10/13/2013  . Type II diabetes mellitus   . Anemia, iron deficiency 10/30/2012  . History of blood transfusion     "maybe 3-4; related to low counts" (10/13/2013)  . Hepatic encephalopathy   . Crohn's disease   . Cirrhosis   . Stroke   . Bronchitis 02/22/2014    seen by Dr. Birdie Riddle, treated /w antibiotic  but as of 02/27/2014- sympytoms have resolved.     Past Surgical History  Procedure Laterality Date  . Appendectomy    . Lumbar laminectomy    . Carpal tunnel release Bilateral   . Cesarean section  1983  . Cysto/ hod/ instillatio clorpactin  07-03-2008  &  06-09-2001    INTERSTITIAL CYSTITIS  . Left shoulder /   including capsulectomy with debridement/  sad with resection of ac Left 01-25-2008  . Right shoulder arthroscopy/ open resection distal clavicle/ debridement adhesive capsulitis/ rotator cuff repair Right 08-30-2003  . Tonsillectomy and adenoidectomy    . Transthoracic echocardiogram  03-28-2011    NORMAL LVSF/ EF 60-65%/ VERY MILD AORTIC STENOSIS, TRIVIAL REGURG./ MILDLY DILATED LEFT ATRIUM  . Cystoscopy  11/26/2011    Procedure: CYSTOSCOPY;  Surgeon: Bernestine Amass, MD;  [facility];  Service: Urology;  Laterality: N/A;  clorpactin  in bladder  . 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 Cholecystectomy  12/2012  . Tubal ligation  1983  . Back surgery    . Artery biopsy Left 02/27/2014    Procedure: BIOPSY TEMPORAL ARTERY;  Surgeon: Elam Dutch, MD;  Location: [place];  Service: Vascular;  Laterality: Left;    Allergies  Allergen Reactions  . Sulfonamide Derivatives     Classic angioedema reaction  . Ciprofloxacin     Mouth ulcers  . Wheat Bran     Celiac's disease     History   Social History  . Marital Status: Married    Spouse Name: N/A    Number of Children: 1  . Years of  Education: N/A   Occupational History  . TRANSCRIPTIONIST --[employer]    Social History Main Topics  . Smoking status: Never Smoker   . Smokeless tobacco: Never Used  . Alcohol Use: No  . Drug Use: No  . Sexual Activity: No   Other Topics Concern  . Not on file   Social History Narrative   Daughter lives in [place], [place]   Family History  Problem Relation Age of Onset  . Diabetes Mother   . Hypertension Mother   . Hyperlipidemia Mother   . Diabetes Brother   . Hypertension Brother   . Hyperlipidemia Brother   . Hypertension    . Colon cancer Neg Hx   . Deep vein thrombosis Father      Review of Systems  Constitutional: Negative for fever and unexpected weight change.  HENT: Positive for congestion and postnasal drip. Negative for dental problem, ear pain, nosebleeds, rhinorrhea, sinus pressure, sneezing, sore throat and trouble swallowing.   Eyes: Negative for redness and itching.  Respiratory: Positive for cough, shortness of breath and wheezing. Negative for chest tightness.   Cardiovascular: Negative for palpitations and leg swelling.  Gastrointestinal: Negative for nausea and vomiting.  Genitourinary: Negative for dysuria.  Musculoskeletal: Negative for joint swelling.  Skin: Negative for rash.  Neurological: Negative for headaches.  Hematological: Does not bruise/bleed easily.  Psychiatric/Behavioral: Negative for dysphoric mood. The patient is not nervous/anxious.        Objective:   Physical Exam  Gen. Pleasant, well-nourished, in no distress, normal affect ENT - no lesions, no post nasal drip Neck: No JVD, no thyromegaly, no carotid bruits Lungs: no use of accessory muscles, no dullness to percussion, decreased right base without rales or rhonchi  Cardiovascular: Rhythm regular, heart sounds  normal, no murmurs or gallops, no peripheral edema Abdomen: soft and non-tender, no hepatosplenomegaly, BS normal. Musculoskeletal: No deformities, no  cyanosis or clubbing Neuro:  alert, non focal Skin- spider nevi over her upper chest       Assessment & Plan:

## 2014-03-16 NOTE — Patient Instructions (Signed)
You have fluid around your right lung - pleural effusion  -likely due to your cirrhosis Cough may be related to bronchitis OK to take DELSYM cough syrup 2 tsp thrice daily as needed for cough Take CLARITIN -D daily x 2 weeks Stay on diuretics (lasix + aldactone)  Repeat CXR next visit Call if breathing worse

## 2014-03-17 NOTE — Assessment & Plan Note (Signed)
She has right pleural effusion  -likely due to cirrhosis and stopping her diuretics  Stay on diuretics (lasix + aldactone)  Repeat CXR next visit, fusion is unchanged her increased-will proceed with thoracenteses. But I do expect that diuretics will help resolve this Call if breathing worse

## 2014-03-17 NOTE — Assessment & Plan Note (Signed)
She likely has a post bronchitic subacute cough-do not feel that more antibiotics are necessary OK to take DELSYM cough syrup 2 tsp thrice daily as needed for cough Take CLARITIN -D daily x 2 weeks

## 2014-03-19 NOTE — Assessment & Plan Note (Signed)
New.  Suspect pt's severe, shooting pain is due to trigeminal neuralgia.  Start Carbamazepine and refer back to neuro.  Pt expressed understanding and is in agreement w/ plan.

## 2014-03-19 NOTE — Assessment & Plan Note (Signed)
New.  Noted on CT done in ER.  Suspect this is cause of pt's cough due to diaphragm irritation.  Pt w/ decreased breath on R.  Suspect that her effusion is related to her ascites but must r/o other causes- infections, malignant, etc.  Pt likely needs pleuracentesis- will attempt to move up her pulmonary appt.  Pt expressed understanding and is in agreement w/ plan.

## 2014-03-20 ENCOUNTER — Ambulatory Visit (INDEPENDENT_AMBULATORY_CARE_PROVIDER_SITE_OTHER): Payer: 59 | Admitting: Internal Medicine

## 2014-03-20 ENCOUNTER — Telehealth: Payer: Self-pay | Admitting: Family Medicine

## 2014-03-20 ENCOUNTER — Other Ambulatory Visit (INDEPENDENT_AMBULATORY_CARE_PROVIDER_SITE_OTHER): Payer: 59

## 2014-03-20 ENCOUNTER — Encounter: Payer: Self-pay | Admitting: Internal Medicine

## 2014-03-20 VITALS — BP 100/60 | HR 68 | Ht 64.0 in | Wt 132.1 lb

## 2014-03-20 DIAGNOSIS — K7290 Hepatic failure, unspecified without coma: Secondary | ICD-10-CM

## 2014-03-20 DIAGNOSIS — K766 Portal hypertension: Secondary | ICD-10-CM

## 2014-03-20 DIAGNOSIS — K7460 Unspecified cirrhosis of liver: Secondary | ICD-10-CM

## 2014-03-20 DIAGNOSIS — K7682 Hepatic encephalopathy: Secondary | ICD-10-CM

## 2014-03-20 LAB — CBC WITH DIFFERENTIAL/PLATELET
Basophils Absolute: 0 10*3/uL (ref 0.0–0.1)
Basophils Relative: 0.2 % (ref 0.0–3.0)
Eosinophils Absolute: 0.2 10*3/uL (ref 0.0–0.7)
Eosinophils Relative: 2 % (ref 0.0–5.0)
HEMATOCRIT: 37.6 % (ref 36.0–46.0)
Hemoglobin: 12.7 g/dL (ref 12.0–15.0)
LYMPHS ABS: 0.9 10*3/uL (ref 0.7–4.0)
LYMPHS PCT: 9.5 % — AB (ref 12.0–46.0)
MCHC: 33.7 g/dL (ref 30.0–36.0)
MCV: 105.2 fl — ABNORMAL HIGH (ref 78.0–100.0)
MONOS PCT: 7.6 % (ref 3.0–12.0)
Monocytes Absolute: 0.7 10*3/uL (ref 0.1–1.0)
Neutro Abs: 7.3 10*3/uL (ref 1.4–7.7)
Neutrophils Relative %: 80.7 % — ABNORMAL HIGH (ref 43.0–77.0)
PLATELETS: 173 10*3/uL (ref 150.0–400.0)
RBC: 3.57 Mil/uL — ABNORMAL LOW (ref 3.87–5.11)
RDW: 17.7 % — AB (ref 11.5–15.5)
WBC: 9.1 10*3/uL (ref 4.0–10.5)

## 2014-03-20 LAB — PROTIME-INR
INR: 2.3 ratio — ABNORMAL HIGH (ref 0.8–1.0)
PROTHROMBIN TIME: 24.5 s — AB (ref 9.6–13.1)

## 2014-03-20 LAB — BASIC METABOLIC PANEL
BUN: 10 mg/dL (ref 6–23)
CO2: 24 meq/L (ref 19–32)
Calcium: 8.5 mg/dL (ref 8.4–10.5)
Chloride: 95 mEq/L — ABNORMAL LOW (ref 96–112)
Creatinine, Ser: 0.7 mg/dL (ref 0.4–1.2)
GFR: 90.8 mL/min (ref >60.00)
Glucose, Bld: 279 mg/dL — ABNORMAL HIGH (ref 70–99)
Potassium: 4.3 mEq/L (ref 3.5–5.1)
Sodium: 127 mEq/L — ABNORMAL LOW (ref 135–145)

## 2014-03-20 LAB — COMPREHENSIVE METABOLIC PANEL
ALT: 43 U/L — AB (ref 0–35)
AST: 80 U/L — ABNORMAL HIGH (ref 0–37)
Albumin: 2.5 g/dL — ABNORMAL LOW (ref 3.5–5.2)
Alkaline Phosphatase: 109 U/L (ref 39–117)
BUN: 10 mg/dL (ref 6–23)
CALCIUM: 8.5 mg/dL (ref 8.4–10.5)
CO2: 24 mEq/L (ref 19–32)
Chloride: 95 mEq/L — ABNORMAL LOW (ref 96–112)
Creatinine, Ser: 0.7 mg/dL (ref 0.4–1.2)
GFR: 90.8 mL/min (ref >60.00)
Glucose, Bld: 279 mg/dL — ABNORMAL HIGH (ref 70–99)
POTASSIUM: 4.3 meq/L (ref 3.5–5.1)
Sodium: 127 mEq/L — ABNORMAL LOW (ref 135–145)
Total Bilirubin: 2.9 mg/dL — ABNORMAL HIGH (ref 0.2–1.2)
Total Protein: 8.3 g/dL (ref 6.0–8.3)

## 2014-03-20 NOTE — Telephone Encounter (Signed)
Pt is requesting a call back, pt states she has having side effects from carbamazepine (TEGRETOL) 200 MG tablet pt states she is having dizziness, can't function, states she having trouble walking and she throwing up. States she stopped taking the medicine on Saturday and she still having side effects.

## 2014-03-20 NOTE — Progress Notes (Signed)
HISTORY OF PRESENT ILLNESS:  Sabrina [name] is a 58 y.o. female with MULTIPLE SIGNIFICANT medical problems as previously well documented. 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 Since her diagnosis of liver disease approximately one year ago, she has done poorly with rapid progression of disease. She is being evaluated at [facility] for possible liver transplant. She is in dissipate in a three-day comprehensive evaluation in mid October. She was last evaluated in this office 04/04/2014. See that dictation for details. Chief complaint that time was abdominal pain. Laboratories were unrevealing. Contrast-enhanced CT scan of the abdomen and pelvis revealed chronic, but no acute changes. She has since been seen at [facility]. Also, in the emergency room a few weeks ago. Laboratories reviewed. She is accompanied today by her husband, Jonni [name]. This is the first time I have met this gentleman. Since her last visit she reports concerns over possible temporal arteritis. She was placed on prednisone with worsening blood sugars. Temporal artery biopsy was negative. Prednisone discontinued. Trigeminal neuralgia was diagnosed. Tegretol started last week. Several days later she developed significant muscle weakness and incoordination. As well, nausea with vomiting. Tegretol was stopped 2 days ago. Feeling just slightly better. She had back off on lactulose because she difficulty getting to the bathroom. No bleeding or fevers. She was also seen by pulmonary. Diagnosed with pleural effusion.  REVIEW OF SYSTEMS:  All non-GI ROS negative except for cough, throat pain.  Past Medical History  Diagnosis Date  . Arthritis   . Allergic rhinitis   . Colitis   . Degenerative disc disease   . Hyperlipidemia   . Hypertension   . Celiac disease   . Bipolar disorder   . Macular degeneration   . Interstitial  cystitis   . Heart murmur   . Anxiety   . Depression   . Liver disease   . Gastric ulcer   . TIA (transient ischemic attack) 06/2013; ?10/13/2013  . Type II diabetes mellitus   . Anemia, iron deficiency 10/30/2012  . History of blood transfusion     "maybe 3-4; related to low counts" (10/13/2013)  . Hepatic encephalopathy   . Crohn's disease   . Cirrhosis   . Stroke   . Bronchitis 02/22/2014    seen by Dr. Birdie Riddle, treated /w antibiotic  but as of 02/27/2014- sympytoms have resolved.     Past Surgical History  Procedure Laterality Date  . Appendectomy    . Lumbar laminectomy    . Carpal tunnel release Bilateral   . Cesarean section  1983  . Cysto/ hod/ instillatio clorpactin  07-03-2008  &  06-09-2001    INTERSTITIAL CYSTITIS  . Left shoulder /   including capsulectomy with debridement/  sad with resection of ac Left 01-25-2008  . Right shoulder arthroscopy/ open resection distal clavicle/ debridement adhesive capsulitis/ rotator cuff repair Right 08-30-2003  . Tonsillectomy and adenoidectomy    . Transthoracic echocardiogram  03-28-2011    NORMAL LVSF/ EF 60-65%/ VERY MILD AORTIC STENOSIS, TRIVIAL REGURG./ MILDLY DILATED LEFT ATRIUM  . Cystoscopy  11/26/2011    Procedure: CYSTOSCOPY;  Surgeon: Bernestine Amass, MD;  Location: [facility];  Service: Urology;  Laterality: N/A;  clorpactin  in bladder  . 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  Cholecystectomy  12/2012  . Tubal ligation  1983  . Back surgery    . Artery biopsy Left 02/27/2014    Procedure: BIOPSY TEMPORAL ARTERY;  Surgeon: Elam Dutch, MD;  Location: [place];  Service: Vascular;  Laterality: Left;    Social History Arlyn Dunning  reports that she has never smoked. She has never used smokeless tobacco. She reports that she does not drink alcohol or use illicit  drugs.  family history includes Deep vein thrombosis in her father; Diabetes in her brother and mother; Hyperlipidemia in her brother and mother; Hypertension in her brother, mother, and another family member. There is no history of Colon cancer.  Allergies  Allergen Reactions  . Sulfonamide Derivatives     Classic angioedema reaction  . Ciprofloxacin     Mouth ulcers  . Wheat Bran     Celiac's disease       PHYSICAL EXAMINATION: Vital signs: BP 100/60  Pulse 68  Ht $R'5\' 4"'Qz$  (1.626 m)  Wt 132 lb 2 oz (59.932 kg)  BMI 22.67 kg/m2  Constitutional: Chronically ill-appearing, no acute distress but very weak. Psychiatric: Slow speech but  oriented x3, cooperative Eyes: extraocular movements intact, anicteric, conjunctiva pink Mouth: oral pharynx moist, no lesions Neck: supple no lymphadenopathy Cardiovascular: heart regular rate and rhythm, no murmur Lungs: clear to auscultation bilaterally. Dullness at the right base to percussion . Absent breath sounds right base Abdomen: soft, nontender, nondistended, no obvious ascites, no peritoneal signs, normal bowel sounds, no organomegaly Extremities: no lower extremity edema bilaterally Skin: Ecchymosis from phlebotomy and multiple spider angiomata. no additional lesions on visible extremities Neuro: No focal deficits. No asterixis.   ASSESSMENT:  #1. Cryptogenic cirrhosis. Advanced liver disease. Portal hypertension with history of ascites. History of hepatic encephalopathy. Overall worsening of condition likely side effect of Tegretol. Currently undergoing transplant evaluation [facility]. She is very weak and unable to care for herself. #2. Multiple other GI problems #3. Multiple other non-GI problems   PLAN:  #1. Continue to discontinue Tegretol #2. Resume lactulose #3. CBC, comprehensive metabolic panel, prothrombin time today #4. Keep appointment at [facility] in a few weeks #5. Initiate home healthcare. She needs help #6. Office followup  here after [facility] evaluation #7. Ongoing general medical care with Dr. Birdie Riddle

## 2014-03-20 NOTE — Telephone Encounter (Signed)
Pt states that she able to move her extremities, but does not have coordination of her hands.  She can hold things in her hand, but after a few minutes drops things.  When she tries to stand her knees buckle and she sways back and forth.  Stating again that she feels like a drunk person.  Dr.Tabori recommendations were discussed with the patient, she stated that she would call Neuro and discuss symptoms with them. She stated that she has already stopped taking the Carbamazepine and does not intend to take anymore.

## 2014-03-20 NOTE — Telephone Encounter (Signed)
If pt truly has no movement of extremities this sounds like an emergency and she needs to be evaluated immediately.  Carbamazepine does list 'difficulty w/ muscle control' as side effect but paralysis is a different issue.  The concentration issues, feeling drunk, vomiting, etc sound more consistent w/ medication side effect.    Pt should call neuro and see if there is anyway for her to be seen sooner- describing her sxs to them.  HOLD Carbamazepine.

## 2014-03-20 NOTE — Telephone Encounter (Signed)
Last OV:  Wednesday, September 16 for trigeminal neuralgia of left side of face and pleural effusion.  She was started on Carbamazepine 1 tab po BID x3 days and if no pain improvement increase to 2 tabs BID for the trigeminal neuralgia.  Pt started the Carbamazepine Wednesday evening and started experiencing side effects on Saturday evening after increasing medication to 2 tabs BID.    Symptoms:  No control/function of extremities. Muscle twitching.  Inability to focus. Throwing up.  Hallucinations.  "Feel drunk."  Double vision.  Denies difficulty swallowing.  Speech clear, but slightly slurred.   Last dose of medication: Saturday, but side effects have persisted.    Appointment with Neuro scheduled for 03/27/14.    Please advise.

## 2014-03-20 NOTE — Patient Instructions (Signed)
Your physician has requested that you go to the basement for the following lab work before leaving today:  CBC, CMET, PT INR  Please follow up with Dr. Marina Goodell on 05/05/2014.    We will contact [facility] to initiate some help for you.  They will contact you regarding this.

## 2014-03-21 ENCOUNTER — Other Ambulatory Visit: Payer: Self-pay

## 2014-03-21 ENCOUNTER — Encounter: Payer: Self-pay | Admitting: Vascular Surgery

## 2014-03-21 DIAGNOSIS — K7460 Unspecified cirrhosis of liver: Secondary | ICD-10-CM

## 2014-03-22 ENCOUNTER — Encounter: Payer: Self-pay | Admitting: Vascular Surgery

## 2014-03-22 ENCOUNTER — Ambulatory Visit (INDEPENDENT_AMBULATORY_CARE_PROVIDER_SITE_OTHER): Payer: Self-pay | Admitting: Vascular Surgery

## 2014-03-22 ENCOUNTER — Institutional Professional Consult (permissible substitution): Payer: 59 | Admitting: Internal Medicine

## 2014-03-22 ENCOUNTER — Telehealth: Payer: Self-pay

## 2014-03-22 VITALS — BP 123/73 | HR 68 | Resp 18 | Ht 64.5 in | Wt 131.8 lb

## 2014-03-22 DIAGNOSIS — M316 Other giant cell arteritis: Secondary | ICD-10-CM

## 2014-03-22 NOTE — Progress Notes (Signed)
Patient is a 58 year old female who returns for followup today. She underwent a left temporal artery biopsy on August 31. This showed benign vascular tissue. No evidence of arteritis. Currently a workup is being pursued for trigeminal neuralgia. Patient reports no numbness around the incision. She does complain of fullness in her left ear and some pain behind the left ear.  Physical exam:  Filed Vitals:   03/22/14 1317  BP: 123/73  Pulse: 68  Resp: 18  Height: 5' 4.5" (1.638 m)  Weight: 131 lb 12.8 oz (59.784 kg)    Well-healed left temporal incision  Assessment/plan: Doing well status post left temporal artery biopsy. No evidence of temporal arteritis.Her pain posterior to her left ear and occipital region do not appear to be related to the temporal artery incision. She will followup on as-needed basis.  Fabienne Bruns, MD [facility] Office: [phone] Pager: [phone]

## 2014-03-22 NOTE — Telephone Encounter (Signed)
Referral made to [facility]. Records faxed to [phone] for social work, PT and OT and help with ADL's.

## 2014-03-22 NOTE — Telephone Encounter (Signed)
Message copied by Chrystie Nose on Wed Mar 22, 2014  1:44 PM ------      Message from: Jeanine Luz      Created: Tue Mar 21, 2014  9:24 AM       Per Dr. Marina Goodell, please contact [facility] about getting help for Patient.  Thanks!! ------

## 2014-03-27 ENCOUNTER — Other Ambulatory Visit (INDEPENDENT_AMBULATORY_CARE_PROVIDER_SITE_OTHER): Payer: 59

## 2014-03-27 ENCOUNTER — Telehealth: Payer: Self-pay | Admitting: Internal Medicine

## 2014-03-27 ENCOUNTER — Ambulatory Visit (INDEPENDENT_AMBULATORY_CARE_PROVIDER_SITE_OTHER): Payer: 59 | Admitting: Neurology

## 2014-03-27 ENCOUNTER — Encounter: Payer: Self-pay | Admitting: Neurology

## 2014-03-27 VITALS — BP 112/61 | HR 86 | Ht 64.0 in | Wt 134.4 lb

## 2014-03-27 DIAGNOSIS — G518 Other disorders of facial nerve: Secondary | ICD-10-CM

## 2014-03-27 DIAGNOSIS — K7460 Unspecified cirrhosis of liver: Secondary | ICD-10-CM

## 2014-03-27 DIAGNOSIS — K900 Celiac disease: Secondary | ICD-10-CM

## 2014-03-27 DIAGNOSIS — K259 Gastric ulcer, unspecified as acute or chronic, without hemorrhage or perforation: Secondary | ICD-10-CM

## 2014-03-27 DIAGNOSIS — K253 Acute gastric ulcer without hemorrhage or perforation: Secondary | ICD-10-CM

## 2014-03-27 DIAGNOSIS — K5090 Crohn's disease, unspecified, without complications: Secondary | ICD-10-CM

## 2014-03-27 DIAGNOSIS — D509 Iron deficiency anemia, unspecified: Secondary | ICD-10-CM

## 2014-03-27 LAB — CBC WITH DIFFERENTIAL/PLATELET
BASOS ABS: 0.2 10*3/uL — AB (ref 0.0–0.1)
Basophils Relative: 1.7 % (ref 0.0–3.0)
Eosinophils Absolute: 0.4 10*3/uL (ref 0.0–0.7)
Eosinophils Relative: 3.3 % (ref 0.0–5.0)
HEMATOCRIT: 39.2 % (ref 36.0–46.0)
Hemoglobin: 12.9 g/dL (ref 12.0–15.0)
LYMPHS ABS: 1.6 10*3/uL (ref 0.7–4.0)
Lymphocytes Relative: 14.9 % (ref 12.0–46.0)
MCHC: 32.8 g/dL (ref 30.0–36.0)
MCV: 106.6 fl — AB (ref 78.0–100.0)
MONO ABS: 1.3 10*3/uL — AB (ref 0.1–1.0)
Monocytes Relative: 11.7 % (ref 3.0–12.0)
Neutro Abs: 7.4 10*3/uL (ref 1.4–7.7)
Neutrophils Relative %: 68.4 % (ref 43.0–77.0)
PLATELETS: 199 10*3/uL (ref 150.0–400.0)
RBC: 3.68 Mil/uL — ABNORMAL LOW (ref 3.87–5.11)
RDW: 17.9 % — AB (ref 11.5–15.5)
WBC: 10.9 10*3/uL — ABNORMAL HIGH (ref 4.0–10.5)

## 2014-03-27 LAB — BASIC METABOLIC PANEL
BUN: 11 mg/dL (ref 6–23)
CALCIUM: 9.2 mg/dL (ref 8.4–10.5)
CO2: 25 mEq/L (ref 19–32)
CREATININE: 0.8 mg/dL (ref 0.4–1.2)
Chloride: 99 mEq/L (ref 96–112)
GFR: 78.97 mL/min (ref >60.00)
Glucose, Bld: 168 mg/dL — ABNORMAL HIGH (ref 70–99)
Potassium: 4.5 mEq/L (ref 3.5–5.1)
Sodium: 128 mEq/L — ABNORMAL LOW (ref 135–145)

## 2014-03-27 MED ORDER — TOPIRAMATE 25 MG PO TABS
25.0000 mg | ORAL_TABLET | Freq: Every day | ORAL | Status: DC
Start: 2014-03-27 — End: 2014-04-18

## 2014-03-27 NOTE — Addendum Note (Signed)
Addended by: Salome Spotted on: 03/27/2014 01:06 PM   Modules accepted: Medications

## 2014-03-27 NOTE — Telephone Encounter (Signed)
Discussed with pt that the results are not back yet. 

## 2014-03-27 NOTE — Progress Notes (Signed)
[facility] [address] [place]. [place] [address] [phone]       OFFICE CONSULT NOTE  Ms. Sabrina [name] Date of Birth:  03-Oct-1955 Medical Record Number:  [id number]   Referring MD:  Rob Hickman  Reason for Referral:  Left facial pain HPI: 77 year Caucasian lady with cryptogenic liver cirrhosis and chronic ascites who has been having left face, ear and neck pain in the last 2 months. She describes the pain has been constant involving the left here and sharp in nature and occasionally she has sharp shooting pain involving her jaw. She has a couple of trigger points in the inside of the left cheek as well as left. She has been taking gabapentin 600 mg times daily from a psychiatrist for chronic anxiety. She was started on Tegretol by her primary physician but when she reached 200 mg twice daily she could not tolerate it due to speech difficulties, denies weakness, vomiting and stopped it. She has taken hydrocodone after tablet but that has helped her sleep mainly. She is unable to ride for specific triggers for her pain but states that it hurts when she chews her food, eats opens her mouth. She in fact underwent temporal artery biopsy by Dr. Fabienne Bruns on 03/07/14 which was negative for vasculitis or temporal arteritis. Patient also complains of occasional intermittent numb feeling involving the inside of the left cheek and tongue but this does not stay long. She is currently been followed by Dr. Marina Goodell gastroenterologist and is on the [facility] liver transplant list. She does have ascites and a right-sided pleural effusion. She was seen by me in may this year for followup for an episode of TIA in January 2015 followed by episode of confusion related to hyperammonemia which improved after treatment with lactulose and rifaximin. She had CT scan of the head done on 03/10/14 which I have personally reviewed and shows no acute abnormality and only mild generalized  atrophy. A previous MRI scan of the brain dated 10/13/2013 had shown similar findings except there was abnormal T1 signal hyperintensity in the globus pallidus consistent with her chronic hepatic encephalopathy.  ROS:   14 system review of systems is positive for  ear pain, facial pain, blurred vision, double vision, cough, murmur, excessive thirst, swollen abdomen, insomnia, daytime sleepiness, dizziness, memory loss, easy bruising, confusion, decreased concentration, walking difficulty and depression.  PMH:  Past Medical History  Diagnosis Date  . Arthritis   . Allergic rhinitis   . Colitis   . Degenerative disc disease   . Hyperlipidemia   . Hypertension   . Celiac disease   . Bipolar disorder   . Macular degeneration   . Interstitial cystitis   . Heart murmur   . Anxiety   . Depression   . Liver disease   . Gastric ulcer   . TIA (transient ischemic attack) 06/2013; ?10/13/2013  . Type II diabetes mellitus   . Anemia, iron deficiency 10/30/2012  . History of blood transfusion     "maybe 3-4; related to low counts" (10/13/2013)  . Hepatic encephalopathy   . Crohn's disease   . Cirrhosis   . Stroke   . Bronchitis 02/22/2014    seen by Dr. Beverely Low, treated /w antibiotic  but as of 02/27/2014- sympytoms have resolved.     Social History:  History   Social History  . Marital Status: Married    Spouse Name: N/A    Number of Children: 1  . Years  of Education: N/A   Occupational History  . TRANSCRIPTIONIST --[employer]    Social History Main Topics  . Smoking status: Never Smoker   . Smokeless tobacco: Never Used  . Alcohol Use: No  . Drug Use: No  . Sexual Activity: No   Other Topics Concern  . Not on file   Social History Narrative   Daughter lives in [place], [place]    Medications:   Current Outpatient Prescriptions on File Prior to Visit  Medication Sig Dispense Refill  . FLUoxetine (PROZAC) 40 MG capsule Take 40 mg by mouth every morning.       . fluticasone  (FLONASE) 50 MCG/ACT nasal spray Place 2 sprays into both nostrils daily.  16 g  6  . gabapentin (NEURONTIN) 600 MG tablet Take 600 mg by mouth 4 (four) times daily.      . iron polysaccharides (POLY-IRON 150) 150 MG capsule Take 1 capsule (150 mg total) by mouth 2 (two) times daily.  180 capsule  3  . lactulose (CHRONULAC) 10 GM/15ML solution Take 30 g by mouth See admin instructions. May take up to 4 times daily. Patient states she usually takes once to twice daily.      Marland Kitchen loratadine (CLARITIN) 10 MG tablet Take 10 mg by mouth daily.      . mesalamine (LIALDA) 1.2 G EC tablet Take 1.2 g by mouth 2 (two) times daily.      . Multiple Vitamins-Minerals (MULTIVITAMIN WITH MINERALS) tablet Take 1 tablet by mouth daily.      . Nutritional Supplements (ENSURE PO) Take 237 mLs by mouth 2 (two) times daily between meals.       Marland Kitchen omeprazole (PRILOSEC) 40 MG capsule Take 40 mg by mouth at bedtime.       . pentosan polysulfate (ELMIRON) 100 MG capsule Take 200 mg by mouth 2 (two) times daily.       . rifaximin (XIFAXAN) 550 MG TABS tablet Take 1 tablet (550 mg total) by mouth 2 (two) times daily.  60 tablet  11  . Saxagliptin-Metformin 10-998 MG TB24 Take 1 tablet by mouth daily.  30 tablet  6  . traZODone (DESYREL) 50 MG tablet Take 50 mg by mouth at bedtime.       No current facility-administered medications on file prior to visit.    Allergies:   Allergies  Allergen Reactions  . Sulfonamide Derivatives     Classic angioedema reaction  . Ciprofloxacin     Mouth ulcers  . Wheat Bran     Celiac's disease    Physical Exam General:  Frail middle aged Caucasian lady, seated, in no evident distress Head: head normocephalic and atraumatic.   Neck: supple with no carotid or supraclavicular bruits Cardiovascular: regular rate and rhythm, soft ejection murmur throughout precordium Musculoskeletal: no deformity Skin: prominent spider naevi on chest and abdomen. Few scattered patechiae. Vascular:   Normal pulses all extremities Abdomen- distended mild ascites  Neurologic Exam Mental Status: Awake and fully alert. Oriented to place and time. Recent and remote memory intact. Attention span, concentration and fund of knowledge slightly diminished. Mood and affect appropriate.  Cranial Nerves: Fundoscopic exam reveals sharp disc margins. Pupils equal, briskly reactive to light. Extraocular movements full without nystagmus. Visual fields full to confrontation. Hearing intact. Facial sensation intact. Face, tongue, palate moves normally and symmetrically.  Motor: Normal bulk and tone. Normal strength in all tested extremity muscles. Sensory.: intact to touch and pinprick and vibratory sensation.  Coordination: Rapid  alternating movements normal in all extremities. Finger-to-nose and heel-to-shin performed accurately bilaterally. Gait and Station: Arises from chair without difficulty. Stance is normal. Gait demonstrates normal stride length and balance . Not able to heel, toe and tandem walk without difficulty.  Reflexes: 1+ and symmetric. Toes downgoing.      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    PLAN: I had a long discussion with patient and her husband about her facial neuralgic pain, discussed evaluation, treatment plan and answered questions. Try topamax 25 mg hs x 1 week followed by twice daily and increase as tolerated. May consider elavil if needed later. Check brain MRI w/wo trigeminal nerve protocol. Follow up in 2 months   Note: This document was prepared with digital dictation and possible smart phrase technology. Any transcriptional errors that result from this process are unintentional.

## 2014-03-27 NOTE — Patient Instructions (Signed)
I had a long discussion with patient and her husband about her facial neuralgic pain, discussed evaluation, treatment plan and answered questions. Try topamax 25 mg hs x 1 week followed by twice daily and increase as tolerated. May consider elavil if needed later. Check brain MRI w/wo. Follow up in 2 months  Trigeminal Neuralgia Trigeminal neuralgia is a nerve disorder that causes sudden attacks of severe facial pain. It is caused by damage to the trigeminal nerve, a major nerve in the face. It is more common in women and in the elderly, although it can also happen in younger patients. Attacks last from a few seconds to several minutes and can occur from a couple of times per year to several times per day. Trigeminal neuralgia can be a very distressing and disabling condition. Surgery may be needed in very severe cases if medical treatment does not give relief. HOME CARE INSTRUCTIONS   If your caregiver prescribed medication to help prevent attacks, take as directed.  To help prevent attacks:  Chew on the unaffected side of the mouth.  Avoid touching your face.  Avoid blasts of hot or cold air.  Men may wish to grow a beard to avoid having to shave. SEEK IMMEDIATE MEDICAL CARE IF:  Pain is unbearable and your medicine does not help.  You develop new, unexplained symptoms (problems).  You have problems that may be related to a medication you are taking. Document Released: 06/13/2000 Document Revised: 09/08/2011 Document Reviewed: 04/13/2009 [facility] Patient Information 2015 [place], [place]. This information is not intended to replace advice given to you by your health care provider. Make sure you discuss any questions you have with your health care provider.

## 2014-03-28 ENCOUNTER — Telehealth: Payer: Self-pay | Admitting: Neurology

## 2014-03-28 ENCOUNTER — Encounter: Payer: Self-pay | Admitting: Family Medicine

## 2014-03-28 ENCOUNTER — Other Ambulatory Visit: Payer: Self-pay

## 2014-03-28 DIAGNOSIS — K7460 Unspecified cirrhosis of liver: Secondary | ICD-10-CM

## 2014-03-28 NOTE — Telephone Encounter (Signed)
Patient calling with questions regarding her new topamax script, says that she was reading the pamphlet and she wants [place] to know that she is on metformin, please return call and advise.

## 2014-03-28 NOTE — Telephone Encounter (Signed)
Spoke with pt, see result note.  

## 2014-03-29 NOTE — Telephone Encounter (Signed)
Spoke to patient and answered concerns

## 2014-03-31 ENCOUNTER — Encounter: Payer: Self-pay | Admitting: Family Medicine

## 2014-03-31 DIAGNOSIS — R040 Epistaxis: Secondary | ICD-10-CM

## 2014-04-03 NOTE — Telephone Encounter (Signed)
Referral placed.

## 2014-04-03 NOTE — Telephone Encounter (Signed)
Spoke with the pt to see if her Janumet XR was approved.  Pt stated that her med was switched from Janumet XR to Saxagliptin-Metformin 5-1000mg  daily.//AB/CMA

## 2014-04-04 ENCOUNTER — Other Ambulatory Visit (INDEPENDENT_AMBULATORY_CARE_PROVIDER_SITE_OTHER): Payer: 59

## 2014-04-04 DIAGNOSIS — K7460 Unspecified cirrhosis of liver: Secondary | ICD-10-CM

## 2014-04-04 LAB — BASIC METABOLIC PANEL
BUN: 10 mg/dL (ref 6–23)
CO2: 22 mEq/L (ref 19–32)
CREATININE: 0.7 mg/dL (ref 0.4–1.2)
Calcium: 9.2 mg/dL (ref 8.4–10.5)
Chloride: 105 mEq/L (ref 96–112)
GFR: 97.17 mL/min (ref >60.00)
Glucose, Bld: 193 mg/dL — ABNORMAL HIGH (ref 70–99)
POTASSIUM: 4.1 meq/L (ref 3.5–5.1)
Sodium: 132 mEq/L — ABNORMAL LOW (ref 135–145)

## 2014-04-05 ENCOUNTER — Other Ambulatory Visit: Payer: Self-pay

## 2014-04-05 DIAGNOSIS — K7469 Other cirrhosis of liver: Secondary | ICD-10-CM

## 2014-04-06 ENCOUNTER — Ambulatory Visit (INDEPENDENT_AMBULATORY_CARE_PROVIDER_SITE_OTHER): Payer: 59 | Admitting: Pulmonary Disease

## 2014-04-06 ENCOUNTER — Encounter: Payer: Self-pay | Admitting: Pulmonary Disease

## 2014-04-06 ENCOUNTER — Ambulatory Visit (HOSPITAL_BASED_OUTPATIENT_CLINIC_OR_DEPARTMENT_OTHER)
Admission: RE | Admit: 2014-04-06 | Discharge: 2014-04-06 | Disposition: A | Discharge status: Home / Self Care | Payer: 59 | Source: Ambulatory Visit | Attending: Pulmonary Disease | Admitting: Pulmonary Disease

## 2014-04-06 ENCOUNTER — Ambulatory Visit (INDEPENDENT_AMBULATORY_CARE_PROVIDER_SITE_OTHER): Payer: 59

## 2014-04-06 VITALS — BP 117/69 | HR 76 | Temp 96.9°F | Ht 64.0 in | Wt 135.0 lb

## 2014-04-06 DIAGNOSIS — Z23 Encounter for immunization: Secondary | ICD-10-CM

## 2014-04-06 DIAGNOSIS — G518 Other disorders of facial nerve: Secondary | ICD-10-CM

## 2014-04-06 DIAGNOSIS — J90 Pleural effusion, not elsewhere classified: Secondary | ICD-10-CM | POA: Diagnosis not present

## 2014-04-06 DIAGNOSIS — R05 Cough: Secondary | ICD-10-CM | POA: Diagnosis present

## 2014-04-06 DIAGNOSIS — J40 Bronchitis, not specified as acute or chronic: Secondary | ICD-10-CM

## 2014-04-06 NOTE — Assessment & Plan Note (Signed)
The right pleural effusion has increased in size. I do think that this is related to her ascites and stopping diuretics. She also has an ejection systolic murmur, prior echo has only showed aortic valve calcification and no significant stenosis. She does not have overt symptoms of heart failure. Her diuretics need to be resumed with close monitoring of her sodium levels- I will defer [facility]. If required we can proceed with diagnostic and therapeutic thoracentesis, especially if her dyspnea worsens

## 2014-04-06 NOTE — Patient Instructions (Signed)
You have fluid around your right lung (effusion) -this has increased since sep 2015 since you are off diuretics Have Dr Julieta Guttinghayashi call me after your evaluation & we can remove this fluid esp if breathing gets worse Flu shot

## 2014-04-06 NOTE — Progress Notes (Signed)
   Subjective:    Patient ID: Sabrina [name], female    DOB: 10-06-1955, 58 y.o.   MRN: [id number]  HPI  58 year old never smoker for FU of cough and right pleural effusion.  She is accompanied by her husband, Jonni [name].  She presented with pancytopenia and splenomegaly in 2014, underwent splenectomy in 01/2013 and was diagnosed with nonalcoholic cirrhosis (perry). She underwent bone marrow biopsy in 12/2012. She also has been diagnosed with Crohn's and celiac disease  Ascites was noted to be present, but she has never undergone a paracentesis. She had a good response to Lasix and Aldactone. She underwent a transplant evaluation and stopped taking diuretics in 01/2014. I note a CT abdomen on 12/30/13 and again on 02/02/14 that shows moderate ascites.  She reports constant congestion, and sinus drip. She developed an episode of bronchitis 11/2013 and has received 3 antibiotic courses over the last 6 weeks. She was also given Tessalon Perles which did not seem to help.  CT chest was performed on 03/10/14 during an ER visit which showed a new right pleural effusion. This was also noted on chest x-ray on 8/31 but not present in 10/13/13.  She has a 1 dog and 2 cats at home.   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 Follow-up    Pt states productive cough withthick white phlegm is worse since last visit. She states her kidney doctor stopped the lasix and aldactone.    She was asked to stop her diuretics due to hyponatremia. Chest x-ray shows that the right effusion is worse She does monitor her weight and this has not fluctuated  She is due for a detailed transplant evaluation next week at [facility] Review of labs shows a sodium of 132 that is improved She denies overt dyspnea or pedal edema.   Review of Systems neg for any significant sore throat, dysphagia, itching, sneezing, nasal congestion or excess/ purulent  secretions, fever, chills, sweats, unintended wt loss, pleuritic or exertional cp, hempoptysis, orthopnea pnd or change in chronic leg swelling. Also denies presyncope, palpitations, heartburn, abdominal pain, nausea, vomiting, diarrhea or change in bowel or urinary habits, dysuria,hematuria, rash, arthralgias, visual complaints, headache, numbness weakness or ataxia.     Objective:   Physical Exam  Gen. Pleasant, well-nourished, in no distress, normal affect ENT - no lesions, no post nasal drip Neck: No JVD, no thyromegaly, no carotid bruits Lungs: no use of accessory muscles, no dullness to percussion, decreased rt base without rales or rhonchi  Cardiovascular: Rhythm regular, heart sounds  normal, ejection systolic murmur 2/6 at base ,no gallops, no peripheral edema Abdomen: soft and non-tender, no hepatosplenomegaly, BS normal. Musculoskeletal: No deformities, no cyanosis or clubbing Neuro:  alert, non focal        Assessment & Plan:

## 2014-04-07 MED ORDER — GADOPENTETATE DIMEGLUMINE 469.01 MG/ML IV SOLN: 12.0000 mL | Freq: Once | INTRAVENOUS | Status: AC | PRN

## 2014-04-18 ENCOUNTER — Encounter (HOSPITAL_COMMUNITY): Payer: Self-pay | Admitting: Emergency Medicine

## 2014-04-18 ENCOUNTER — Telehealth: Payer: Self-pay

## 2014-04-18 ENCOUNTER — Observation Stay (HOSPITAL_COMMUNITY)
Admission: EM | Admit: 2014-04-18 | Discharge: 2014-04-19 | Disposition: A | Discharge status: Home / Self Care | Payer: 59 | Attending: Internal Medicine | Admitting: Internal Medicine

## 2014-04-18 ENCOUNTER — Emergency Department (HOSPITAL_COMMUNITY): Payer: 59

## 2014-04-18 DIAGNOSIS — R937 Abnormal findings on diagnostic imaging of other parts of musculoskeletal system: Secondary | ICD-10-CM

## 2014-04-18 DIAGNOSIS — R011 Cardiac murmur, unspecified: Secondary | ICD-10-CM

## 2014-04-18 DIAGNOSIS — J40 Bronchitis, not specified as acute or chronic: Secondary | ICD-10-CM

## 2014-04-18 DIAGNOSIS — E876 Hypokalemia: Secondary | ICD-10-CM

## 2014-04-18 DIAGNOSIS — K7460 Unspecified cirrhosis of liver: Secondary | ICD-10-CM | POA: Diagnosis present

## 2014-04-18 DIAGNOSIS — F4323 Adjustment disorder with mixed anxiety and depressed mood: Secondary | ICD-10-CM

## 2014-04-18 DIAGNOSIS — Z566 Other physical and mental strain related to work: Secondary | ICD-10-CM

## 2014-04-18 DIAGNOSIS — E785 Hyperlipidemia, unspecified: Secondary | ICD-10-CM | POA: Diagnosis not present

## 2014-04-18 DIAGNOSIS — K7290 Hepatic failure, unspecified without coma: Secondary | ICD-10-CM

## 2014-04-18 DIAGNOSIS — K120 Recurrent oral aphthae: Secondary | ICD-10-CM

## 2014-04-18 DIAGNOSIS — E43 Unspecified severe protein-calorie malnutrition: Secondary | ICD-10-CM

## 2014-04-18 DIAGNOSIS — K922 Gastrointestinal hemorrhage, unspecified: Secondary | ICD-10-CM

## 2014-04-18 DIAGNOSIS — R945 Abnormal results of liver function studies: Secondary | ICD-10-CM

## 2014-04-18 DIAGNOSIS — Z79899 Other long term (current) drug therapy: Secondary | ICD-10-CM | POA: Diagnosis not present

## 2014-04-18 DIAGNOSIS — K7581 Nonalcoholic steatohepatitis (NASH): Secondary | ICD-10-CM | POA: Insufficient documentation

## 2014-04-18 DIAGNOSIS — L0232 Furuncle of buttock: Secondary | ICD-10-CM

## 2014-04-18 DIAGNOSIS — E162 Hypoglycemia, unspecified: Secondary | ICD-10-CM

## 2014-04-18 DIAGNOSIS — Z9081 Acquired absence of spleen: Secondary | ICD-10-CM

## 2014-04-18 DIAGNOSIS — E7220 Disorder of urea cycle metabolism, unspecified: Secondary | ICD-10-CM

## 2014-04-18 DIAGNOSIS — I10 Essential (primary) hypertension: Secondary | ICD-10-CM

## 2014-04-18 DIAGNOSIS — Z8673 Personal history of transient ischemic attack (TIA), and cerebral infarction without residual deficits: Secondary | ICD-10-CM | POA: Diagnosis not present

## 2014-04-18 DIAGNOSIS — R188 Other ascites: Secondary | ICD-10-CM

## 2014-04-18 DIAGNOSIS — G500 Trigeminal neuralgia: Secondary | ICD-10-CM

## 2014-04-18 DIAGNOSIS — D7381 Neutropenic splenomegaly: Secondary | ICD-10-CM

## 2014-04-18 DIAGNOSIS — K900 Celiac disease: Secondary | ICD-10-CM

## 2014-04-18 DIAGNOSIS — J90 Pleural effusion, not elsewhere classified: Secondary | ICD-10-CM | POA: Diagnosis not present

## 2014-04-18 DIAGNOSIS — R0602 Shortness of breath: Secondary | ICD-10-CM

## 2014-04-18 DIAGNOSIS — R519 Headache, unspecified: Secondary | ICD-10-CM

## 2014-04-18 DIAGNOSIS — R251 Tremor, unspecified: Secondary | ICD-10-CM

## 2014-04-18 DIAGNOSIS — I350 Nonrheumatic aortic (valve) stenosis: Secondary | ICD-10-CM

## 2014-04-18 DIAGNOSIS — R269 Unspecified abnormalities of gait and mobility: Secondary | ICD-10-CM

## 2014-04-18 DIAGNOSIS — E119 Type 2 diabetes mellitus without complications: Secondary | ICD-10-CM | POA: Diagnosis not present

## 2014-04-18 DIAGNOSIS — M316 Other giant cell arteritis: Secondary | ICD-10-CM

## 2014-04-18 DIAGNOSIS — N3010 Interstitial cystitis (chronic) without hematuria: Secondary | ICD-10-CM

## 2014-04-18 DIAGNOSIS — F341 Dysthymic disorder: Secondary | ICD-10-CM

## 2014-04-18 DIAGNOSIS — K7682 Hepatic encephalopathy: Secondary | ICD-10-CM

## 2014-04-18 DIAGNOSIS — D689 Coagulation defect, unspecified: Secondary | ICD-10-CM

## 2014-04-18 DIAGNOSIS — F319 Bipolar disorder, unspecified: Secondary | ICD-10-CM | POA: Diagnosis not present

## 2014-04-18 DIAGNOSIS — K5010 Crohn's disease of large intestine without complications: Secondary | ICD-10-CM

## 2014-04-18 DIAGNOSIS — R609 Edema, unspecified: Secondary | ICD-10-CM

## 2014-04-18 DIAGNOSIS — K625 Hemorrhage of anus and rectum: Secondary | ICD-10-CM

## 2014-04-18 DIAGNOSIS — R51 Headache: Secondary | ICD-10-CM

## 2014-04-18 DIAGNOSIS — R7989 Other specified abnormal findings of blood chemistry: Secondary | ICD-10-CM

## 2014-04-18 DIAGNOSIS — N390 Urinary tract infection, site not specified: Secondary | ICD-10-CM

## 2014-04-18 DIAGNOSIS — K7469 Other cirrhosis of liver: Secondary | ICD-10-CM

## 2014-04-18 DIAGNOSIS — G518 Other disorders of facial nerve: Secondary | ICD-10-CM

## 2014-04-18 HISTORY — DX: Pleural effusion, not elsewhere classified: J90

## 2014-04-18 LAB — CBC WITH DIFFERENTIAL/PLATELET
BASOS ABS: 0.1 10*3/uL (ref 0.0–0.1)
Basophils Relative: 1 % (ref 0–1)
Eosinophils Absolute: 0.5 10*3/uL (ref 0.0–0.7)
Eosinophils Relative: 4 % (ref 0–5)
HCT: 33.4 % — ABNORMAL LOW (ref 36.0–46.0)
Hemoglobin: 12.1 g/dL (ref 12.0–15.0)
LYMPHS PCT: 17 % (ref 12–46)
Lymphs Abs: 2.2 10*3/uL (ref 0.7–4.0)
MCH: 35.4 pg — ABNORMAL HIGH (ref 26.0–34.0)
MCHC: 36.2 g/dL — AB (ref 30.0–36.0)
MCV: 97.7 fL (ref 78.0–100.0)
MONOS PCT: 12 % (ref 3–12)
Monocytes Absolute: 1.6 10*3/uL — ABNORMAL HIGH (ref 0.1–1.0)
NEUTROS PCT: 66 % (ref 43–77)
Neutro Abs: 8.7 10*3/uL — ABNORMAL HIGH (ref 1.7–7.7)
Platelets: 230 10*3/uL (ref 150–400)
RBC: 3.42 MIL/uL — ABNORMAL LOW (ref 3.87–5.11)
RDW: 16.9 % — AB (ref 11.5–15.5)
WBC: 13.1 10*3/uL — AB (ref 4.0–10.5)

## 2014-04-18 LAB — COMPREHENSIVE METABOLIC PANEL
ALBUMIN: 2.4 g/dL — AB (ref 3.5–5.2)
ALK PHOS: 143 U/L — AB (ref 39–117)
ALT: 62 U/L — AB (ref 0–35)
ANION GAP: 15 (ref 5–15)
AST: 146 U/L — AB (ref 0–37)
BUN: 10 mg/dL (ref 6–23)
CO2: 17 mEq/L — ABNORMAL LOW (ref 19–32)
Calcium: 8.9 mg/dL (ref 8.4–10.5)
Chloride: 97 mEq/L (ref 96–112)
Creatinine, Ser: 0.66 mg/dL (ref 0.50–1.10)
GFR calc Af Amer: >90 mL/min (ref >90)
GFR calc non Af Amer: >90 mL/min (ref >90)
Glucose, Bld: 202 mg/dL — ABNORMAL HIGH (ref 70–99)
POTASSIUM: 4.3 meq/L (ref 3.7–5.3)
SODIUM: 129 meq/L — AB (ref 137–147)
Total Bilirubin: 3.4 mg/dL — ABNORMAL HIGH (ref 0.3–1.2)
Total Protein: 8.9 g/dL — ABNORMAL HIGH (ref 6.0–8.3)

## 2014-04-18 LAB — I-STAT TROPONIN, ED: Troponin i, poc: 0.02 ng/mL (ref 0.00–0.08)

## 2014-04-18 LAB — TROPONIN I: Troponin I: <0.3 ng/mL (ref <0.30)

## 2014-04-18 LAB — GLUCOSE, CAPILLARY: GLUCOSE-CAPILLARY: 126 mg/dL — AB (ref 70–99)

## 2014-04-18 LAB — AMMONIA: Ammonia: 92 umol/L — ABNORMAL HIGH (ref 11–60)

## 2014-04-18 LAB — PRO B NATRIURETIC PEPTIDE: Pro B Natriuretic peptide (BNP): 78.9 pg/mL (ref 0–125)

## 2014-04-18 MED ORDER — ONDANSETRON HCL 4 MG/2ML IJ SOLN: 4.0000 mg | Freq: Four times a day (QID) | INTRAMUSCULAR | Status: DC | PRN

## 2014-04-18 MED ORDER — RIFAXIMIN 550 MG PO TABS
550.0000 mg | ORAL_TABLET | Freq: Two times a day (BID) | ORAL | Status: DC
  Administered 2014-04-18 – 2014-04-19 (×2): 550 mg via ORAL
  Filled 2014-04-18 (×3): qty 1

## 2014-04-18 MED ORDER — LORATADINE 10 MG PO TABS
10.0000 mg | ORAL_TABLET | Freq: Every day | ORAL | Status: DC
  Administered 2014-04-19: 10 mg via ORAL
  Filled 2014-04-18: qty 1

## 2014-04-18 MED ORDER — ALBUTEROL SULFATE (2.5 MG/3ML) 0.083% IN NEBU: 2.5000 mg | INHALATION_SOLUTION | RESPIRATORY_TRACT | Status: DC | PRN

## 2014-04-18 MED ORDER — POLYSACCHARIDE IRON COMPLEX 150 MG PO CAPS
150.0000 mg | ORAL_CAPSULE | Freq: Two times a day (BID) | ORAL | Status: DC
  Administered 2014-04-18 – 2014-04-19 (×2): 150 mg via ORAL
  Filled 2014-04-18 (×3): qty 1

## 2014-04-18 MED ORDER — MESALAMINE 1.2 G PO TBEC
1.2000 g | DELAYED_RELEASE_TABLET | Freq: Two times a day (BID) | ORAL | Status: DC
  Administered 2014-04-18 – 2014-04-19 (×2): 1.2 g via ORAL
  Filled 2014-04-18 (×4): qty 1

## 2014-04-18 MED ORDER — PENTOSAN POLYSULFATE SODIUM 100 MG PO CAPS
200.0000 mg | ORAL_CAPSULE | Freq: Two times a day (BID) | ORAL | Status: DC
  Administered 2014-04-18 – 2014-04-19 (×2): 200 mg via ORAL
  Filled 2014-04-18 (×3): qty 2

## 2014-04-18 MED ORDER — INSULIN ASPART 100 UNIT/ML ~~LOC~~ SOLN
0.0000 [IU] | Freq: Three times a day (TID) | SUBCUTANEOUS | Status: DC
  Administered 2014-04-19: 2 [IU] via SUBCUTANEOUS
  Administered 2014-04-19: 1 [IU] via SUBCUTANEOUS

## 2014-04-18 MED ORDER — TRAZODONE HCL 50 MG PO TABS
50.0000 mg | ORAL_TABLET | Freq: Every day | ORAL | Status: DC
  Administered 2014-04-18: 50 mg via ORAL
  Filled 2014-04-18 (×2): qty 1

## 2014-04-18 MED ORDER — FLUOXETINE HCL 20 MG PO CAPS
40.0000 mg | ORAL_CAPSULE | Freq: Every morning | ORAL | Status: DC
  Administered 2014-04-19: 40 mg via ORAL
  Filled 2014-04-18: qty 2

## 2014-04-18 MED ORDER — ONDANSETRON HCL 4 MG PO TABS: 4.0000 mg | ORAL_TABLET | Freq: Four times a day (QID) | ORAL | Status: DC | PRN

## 2014-04-18 MED ORDER — PANTOPRAZOLE SODIUM 40 MG PO TBEC
40.0000 mg | DELAYED_RELEASE_TABLET | Freq: Every day | ORAL | Status: DC
  Administered 2014-04-18 – 2014-04-19 (×2): 40 mg via ORAL
  Filled 2014-04-18 (×2): qty 1

## 2014-04-18 MED ORDER — LACTULOSE 10 GM/15ML PO SOLN
30.0000 g | ORAL | Status: AC
  Administered 2014-04-18: 30 g via ORAL
  Filled 2014-04-18: qty 45

## 2014-04-18 MED ORDER — POLYETHYLENE GLYCOL 3350 17 G PO PACK
17.0000 g | PACK | Freq: Every day | ORAL | Status: DC | PRN
  Filled 2014-04-18: qty 1

## 2014-04-18 MED ORDER — GABAPENTIN 600 MG PO TABS
600.0000 mg | ORAL_TABLET | Freq: Four times a day (QID) | ORAL | Status: DC
  Administered 2014-04-18 – 2014-04-19 (×3): 600 mg via ORAL
  Filled 2014-04-18 (×6): qty 1

## 2014-04-18 MED ORDER — TOPIRAMATE 25 MG PO TABS
25.0000 mg | ORAL_TABLET | Freq: Two times a day (BID) | ORAL | Status: DC
  Administered 2014-04-18 – 2014-04-19 (×2): 25 mg via ORAL
  Filled 2014-04-18 (×3): qty 1

## 2014-04-18 MED ORDER — LACTULOSE 10 GM/15ML PO SOLN
30.0000 g | Freq: Four times a day (QID) | ORAL | Status: DC
  Administered 2014-04-18 – 2014-04-19 (×3): 30 g via ORAL
  Filled 2014-04-18 (×6): qty 45

## 2014-04-18 MED ORDER — SENNA 8.6 MG PO TABS
1.0000 | ORAL_TABLET | Freq: Two times a day (BID) | ORAL | Status: DC
  Administered 2014-04-18: 8.6 mg via ORAL
  Filled 2014-04-18 (×3): qty 1

## 2014-04-18 NOTE — ED Notes (Signed)
Pt c/o increased SOB and productive cough with thick sputum; pt with hx of liver failure and waiting for transplant; pt had thoracentesis on last Thursday

## 2014-04-18 NOTE — Telephone Encounter (Signed)
Message copied by Chrystie NoseHUNT, Marlisha Vanwyk R on Tue Apr 18, 2014 10:38 AM ------      Message from: Sitara Cashwell, [place]      Created: Wed Apr 05, 2014  2:28 PM      Regarding: BMET       Needs BMET in 2 weeks, order in ------

## 2014-04-18 NOTE — ED Provider Notes (Signed)
I saw and evaluated the patient, reviewed the resident's note and I agree with the findings and plan.   EKG Interpretation   Date/Time:  Tuesday April 18 2014 13:41:09 EDT Ventricular Rate:  89 PR Interval:  150 QRS Duration: 90 QT Interval:  450 QTC Calculation: 547 R Axis:   36 Text Interpretation:  Normal sinus rhythm Cannot rule out Anterior infarct  , age undetermined Prolonged QT Abnormal ECG No acute changes Confirmed by  Rhunette Croft, MD, Janey Genta [phone]) on 04/18/2014 2:20:21 PM     Patient here complaining of shortness of breath and cough x1 day. Chest x-ray shows large right-sided pleural effusion. Will admit to medicine for thoracentesis  Toy Baker, MD 04/18/14 1549

## 2014-04-18 NOTE — Telephone Encounter (Signed)
Pt aware.

## 2014-04-18 NOTE — H&P (Signed)
Patient Demographics  Sabrina [name], is a 58 y.o. female  MRN: [id number]   DOB - 07/06/1955  Admit Date - 04/18/2014  Outpatient Primary MD for the patient is Neena Rhymes, MD   With History of -  Past Medical History  Diagnosis Date  . Arthritis   . Allergic rhinitis   . Colitis   . Degenerative disc disease   . Hyperlipidemia   . Hypertension   . Celiac disease   . Bipolar disorder   . Macular degeneration   . Interstitial cystitis   . Heart murmur   . Anxiety   . Depression   . Liver disease   . Gastric ulcer   . TIA (transient ischemic attack) 06/2013; ?10/13/2013  . Type II diabetes mellitus   . Anemia, iron deficiency 10/30/2012  . History of blood transfusion     "maybe 3-4; related to low counts" (10/13/2013)  . Hepatic encephalopathy   . Crohn's disease   . Cirrhosis   . Stroke   . Bronchitis 02/22/2014    seen by Dr. Beverely Low, treated /w antibiotic  but as of 02/27/2014- sympytoms have resolved.       Past Surgical History  Procedure Laterality Date  . Appendectomy    . Lumbar laminectomy    . Carpal tunnel release Bilateral   . Cesarean section  1983  . Cysto/ hod/ instillatio clorpactin  07-03-2008  &  06-09-2001    INTERSTITIAL CYSTITIS  . Left shoulder /   including capsulectomy with debridement/  sad with resection of ac Left 01-25-2008  . Right shoulder arthroscopy/ open resection distal clavicle/ debridement adhesive capsulitis/ rotator cuff repair Right 08-30-2003  . Tonsillectomy and adenoidectomy    . Transthoracic echocardiogram  03-28-2011    NORMAL LVSF/ EF 60-65%/ VERY MILD AORTIC STENOSIS, TRIVIAL REGURG./ MILDLY DILATED LEFT ATRIUM  . Cystoscopy  11/26/2011    Procedure: CYSTOSCOPY;  Surgeon: Valetta Fuller, MD;  Location: [facility];  Service: Urology;  Laterality: N/A;  clorpactin  in bladder  . 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 Cholecystectomy  12/2012  . Tubal ligation  1983  . Back surgery    . Artery biopsy Left 02/27/2014    Procedure: BIOPSY TEMPORAL ARTERY;  Surgeon: Sherren Kerns, MD;  Location: [facility] OR;  Service: Vascular;  Laterality: Left;    in for   Chief Complaint  Patient presents with  . Shortness of Breath     HPI  Sabrina [name]  is a 58 y.o. female, with history of cryptogenic liver failure, recurrent right pleural effusion, systolic heart murmur, anemia, diabetes mellitus, presents with complaints of shortness of breath, and reports she recently had 2 L thoracocentesis at [facility] on 10/15, patient has been followed at [facility] as she is being evaluated for liver transplant, patient reports recurrent of shortness of breath this morning which prompted her to come to ED, reports it's mainly supine, patient x-ray showing a moderate right lung pleural effusion with dependent atelectasis, hospitalist requested to admit the patient for thoracocentesis, patient workup was significant for elevated LFTs which is her baseline, and elevated ammonia level at 92, she denies any altered mental status or lethargy, she reports normal bowel movements today yet.    Review of Systems    In addition to the HPI above,  No Fever-chills,  No Headache, No changes with Vision or hearing, No problems swallowing food or Liquids, No Chest pain, reports Shortness of Breath, No Abdominal pain, No Nausea or Vommitting, Bowel movements are regular, No Blood in stool or Urine, No dysuria, No new skin rashes or bruises, No new joints pains-aches,  No new weakness, tingling, numbness in any extremity, No recent weight gain or loss, No polyuria, polydypsia or polyphagia, No significant Mental Stressors.  A full 10 point Review of Systems was done, except as stated above, all other Review of Systems were negative.   Social History History  Substance Use Topics  .  Smoking status: Never Smoker   . Smokeless tobacco: Never Used  . Alcohol Use: No     Family History Family History  Problem Relation Age of Onset  . Diabetes Mother   . Hypertension Mother   . Hyperlipidemia Mother   . Diabetes Brother   . Hypertension Brother   . Hyperlipidemia Brother   . Hypertension    . Colon cancer Neg Hx   . Deep vein thrombosis Father     Prior to Admission medications   Medication Sig Start Date End Date Taking? Authorizing Provider  FLUoxetine (PROZAC) 40 MG capsule Take 40 mg by mouth every morning.    Yes Historical Provider, MD  gabapentin (NEURONTIN) 600 MG tablet Take 600 mg by mouth 4 (four) times daily.   Yes Historical Provider, MD  iron polysaccharides (POLY-IRON 150) 150 MG capsule Take 1 capsule (150 mg total) by mouth 2 (two) times daily. 09/05/13  Yes Hilarie Fredrickson, MD  lactulose (CHRONULAC) 10 GM/15ML solution Take 30 g by mouth See admin instructions. May take up to 4 times daily. Patient states she usually takes once to twice daily.   Yes Historical Provider, MD  loratadine (CLARITIN) 10 MG tablet Take 10 mg by mouth daily.   Yes Historical Provider, MD  mesalamine (LIALDA) 1.2 G EC tablet Take 1.2 g by mouth 2 (two) times daily.   Yes Historical Provider, MD  Multiple Vitamins-Minerals (MULTIVITAMIN WITH MINERALS) tablet Take 1 tablet by mouth daily.   Yes Historical Provider, MD  Nutritional Supplements (ENSURE PO) Take 237 mLs by mouth 3 (three) times daily.    Yes Historical Provider, MD  omeprazole (PRILOSEC) 40 MG capsule Take 40 mg by mouth at bedtime.    Yes Historical Provider, MD  pentosan polysulfate (ELMIRON) 100 MG capsule Take 200 mg by mouth 2 (two) times daily.    Yes Historical Provider, MD  rifaximin (XIFAXAN) 550 MG TABS tablet Take 1 tablet (550 mg total) by mouth 2 (two) times daily. 10/27/13  Yes Amy S Esterwood, PA-C  Saxagliptin-Metformin 10-998 MG TB24 Take 1 tablet by mouth daily. 02/09/14  Yes Sheliah Hatch, MD    topiramate (TOPAMAX) 25 MG tablet Take 25 mg by mouth 2 (two) times daily.   Yes Historical Provider, MD  traZODone (DESYREL) 50 MG tablet Take 50 mg by mouth at bedtime.   Yes Historical Provider, MD    Allergies  Allergen Reactions  . Sulfonamide Derivatives Other (See Comments)    Classic angioedema reaction  . Ciprofloxacin Other (See Comments)    Mouth ulcers  . Wheat Bran Other (See Comments)    Celiac's disease    Physical Exam  Vitals  Blood pressure 117/57, pulse 74, temperature 98.2 F (36.8 C), temperature source Oral, resp. rate 12, SpO2 100.00%.   1. General elderly female lying in bed in NAD,   2.  Normal affect and insight, Not Suicidal or Homicidal, Awake Alert, Oriented X 3.  3. No F.N deficits, ALL C.Nerves Intact, Strength 5/5 all 4 extremities, Sensation intact all 4 extremities, Plantars down going.  4. Ears and Eyes appear Normal, has icteric sclera, Conjunctivae clear, PERRLA. Moist Oral Mucosa.  5. Supple Neck, No JVD, No cervical lymphadenopathy appriciated, No Carotid Bruits.  6. Symmetrical Chest wall movement, decreased air entry to the right lung  7. RRR, No Gallops, Rubs or Murmurs, No Parasternal Heave.  8. Positive Bowel Sounds, Abdomen Soft, No tenderness, No organomegaly appriciated,No rebound -guarding or rigidity. No significant ascites appreciated on physical exam.  9.  No Cyanosis, Normal Skin Turgor, No Skin Rash or Bruise. Mild Jaundice  10. Good muscle tone,  joints appear normal , no effusions, Normal ROM.  11. No Palpable Lymph Nodes in Neck or Axillae    Data Review  CBC  Recent Labs Lab 04/18/14 1351  WBC 13.1*  HGB 12.1  HCT 33.4*  PLT 230  MCV 97.7  MCH 35.4*  MCHC 36.2*  RDW 16.9*  LYMPHSABS 2.2  MONOABS 1.6*  EOSABS 0.5  BASOSABS 0.1   ------------------------------------------------------------------------------------------------------------------  Chemistries   Recent Labs Lab 04/18/14 1351   NA 129*  K 4.3  CL 97  CO2 17*  GLUCOSE 202*  BUN 10  CREATININE 0.66  CALCIUM 8.9  AST 146*  ALT 62*  ALKPHOS 143*  BILITOT 3.4*   ------------------------------------------------------------------------------------------------------------------ CrCl is unknown because both a height and weight (above a minimum accepted value) are required for this calculation. ------------------------------------------------------------------------------------------------------------------ No results found for this basename: TSH, T4TOTAL, FREET3, T3FREE, THYROIDAB,  in the last 72 hours   Coagulation profile No results found for this basename: INR, PROTIME,  in the last 168 hours ------------------------------------------------------------------------------------------------------------------- No results found for this basename: DDIMER,  in the last 72 hours -------------------------------------------------------------------------------------------------------------------  Cardiac Enzymes No results found for this basename: CK, CKMB, TROPONINI, MYOGLOBIN,  in the last 168 hours ------------------------------------------------------------------------------------------------------------------ No components found with this basename: POCBNP,    ---------------------------------------------------------------------------------------------------------------  Urinalysis    Component Value Date/Time   COLORURINE YELLOW 01/26/2014 1453   APPEARANCEUR CLEAR 01/26/2014 1453   LABSPEC 1.025 01/26/2014 1453   PHURINE 5.0 01/26/2014 1453   GLUCOSEU NEGATIVE 01/26/2014 1453   GLUCOSEU NEGATIVE 12/30/2013 1710   HGBUR NEGATIVE 01/26/2014 1453   BILIRUBINUR MODERATE* 01/26/2014 1453   BILIRUBINUR neg 10/07/2013 1350   KETONESUR TRACE* 01/26/2014 1453   PROTEINUR NEGATIVE 12/30/2013 1710   PROTEINUR neg 10/07/2013 1350   UROBILINOGEN 1.0 01/26/2014 1453   UROBILINOGEN 2.0 10/07/2013 1350   NITRITE POSITIVE*  01/26/2014 1453   NITRITE neg 10/07/2013 1350   LEUKOCYTESUR TRACE* 01/26/2014 1453    ----------------------------------------------------------------------------------------------------------------  Imaging results:   Dg Chest 2 View  04/18/2014   CLINICAL DATA:  Shortness of breath.  EXAM: CHEST  2 VIEW  COMPARISON:  April 06, 2014.  FINDINGS: The heart size and mediastinal contours are within normal limits. Left lung is clear. Stable moderate size right pleural effusion is noted with possible underlying atelectasis. No pneumothorax is noted. The visualized skeletal structures are unremarkable.  IMPRESSION: Stable moderate size right pleural effusion with probable underlying atelectasis.   Electronically Signed   By: Roque Lias M.D.   On: 04/18/2014 14:32        Assessment & Plan  Principal Problem:   Pleural effusion, right Active Problems:   Cirrhosis of liver   Abnormal LFTs   Hyperammonemia   Essential hypertension  Bipolar disorder   Diabetes mellitus type 2, noninsulin dependent    Recurrent right pleural effusion -In the setting of liver failure, will request ultrasound guided thoracocentesis for symptomatic relief of her shortness of breath.  Cirrhosis of the liver -Cryptogenic, [facility]. -Continue with lactulose and rifaximin  Hyperammonemia -Increase lactulose level  Diabetes mellitus -Hold oral hypoglycemic agent, and keep on insulin sliding scale while in the hospital.  Bipolar disorder -Continue home medication  Abnormal LFTs -At baseline, is secondary to her liver disease.   DVT Prophylaxis SCDs   AM Labs Ordered, also please review Full Orders  Family Communication: Admission, patients condition and plan of care including tests being ordered have been discussed with the patient and husband who indicate understanding and agree with the plan and Code Status.  Code Status full  Likely DC to  home  Condition  GUARDED    Time spent in minutes : 55 minute    Alexandrya Chim M.D on 04/18/2014 at 5:58 PM  Between 7am to 7pm - Pager - [phone]  After 7pm go to [email or web address] - password [email or web address]  And look for the night coverage person covering me after hours  [facility] Office  [phone]   **Disclaimer: This note may have been dictated with voice recognition software. Similar sounding words can inadvertently be transcribed and this note may contain transcription errors which may not have been corrected upon publication of note.**

## 2014-04-18 NOTE — ED Provider Notes (Signed)
CSN: [id number]     Arrival date & time 04/18/14  1326 History   None    Chief Complaint  Patient presents with  . Shortness of Breath     (Consider location/radiation/quality/duration/timing/severity/associated sxs/prior Treatment) Patient is a 58 y.o. female presenting with shortness of breath.  Shortness of Breath Severity:  Moderate Onset quality:  Gradual Duration:  1 day Timing:  Constant Progression:  Worsening Chronicity:  Recurrent Context: activity   Relieved by: thoracentesis. Worsened by:  Activity Ineffective treatments:  None tried Associated symptoms: cough and sputum production   Associated symptoms: no abdominal pain, no chest pain, no fever, no headaches, no rash, no sore throat, no vomiting and no wheezing   Risk factors: no hx of cancer     Past Medical History  Diagnosis Date  . Arthritis   . Allergic rhinitis   . Colitis   . Degenerative disc disease   . Hyperlipidemia   . Hypertension   . Celiac disease   . Bipolar disorder   . Macular degeneration   . Interstitial cystitis   . Heart murmur   . Anxiety   . Depression   . Liver disease   . Gastric ulcer   . TIA (transient ischemic attack) 06/2013; ?10/13/2013  . Type II diabetes mellitus   . Anemia, iron deficiency 10/30/2012  . History of blood transfusion     "maybe 3-4; related to low counts" (10/13/2013)  . Hepatic encephalopathy   . Crohn's disease   . Cirrhosis   . Stroke   . Bronchitis 02/22/2014    seen by Dr. Beverely Low, treated /w antibiotic  but as of 02/27/2014- sympytoms have resolved.    Past Surgical History  Procedure Laterality Date  . Appendectomy    . Lumbar laminectomy    . Carpal tunnel release Bilateral   . Cesarean section  1983  . Cysto/ hod/ instillatio clorpactin  07-03-2008  &  06-09-2001    INTERSTITIAL CYSTITIS  . Left shoulder /   including capsulectomy with debridement/  sad with resection of ac Left 01-25-2008  . Right shoulder arthroscopy/ open resection  distal clavicle/ debridement adhesive capsulitis/ rotator cuff repair Right 08-30-2003  . Tonsillectomy and adenoidectomy    . Transthoracic echocardiogram  03-28-2011    NORMAL LVSF/ EF 60-65%/ VERY MILD AORTIC STENOSIS, TRIVIAL REGURG./ MILDLY DILATED LEFT ATRIUM  . Cystoscopy  11/26/2011    Procedure: CYSTOSCOPY;  Surgeon: Valetta Fuller, MD;  Location: [facility];  Service: Urology;  Laterality: N/A;  clorpactin  in bladder  . 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 Cholecystectomy  12/2012  . Tubal ligation  1983  . Back surgery    . Artery biopsy Left 02/27/2014    Procedure: BIOPSY TEMPORAL ARTERY;  Surgeon: Sherren Kerns, MD;  Location: [facility] OR;  Service: Vascular;  Laterality: Left;   Family History  Problem Relation Age of Onset  . Diabetes Mother   . Hypertension Mother   . Hyperlipidemia Mother   . Diabetes Brother   . Hypertension Brother   . Hyperlipidemia Brother   . Hypertension    . Colon cancer Neg Hx   . Deep vein thrombosis Father    History  Substance Use Topics  . Smoking status: Never Smoker   . Smokeless tobacco: Never Used  . Alcohol Use: No  OB History   Grav Para Term Preterm Abortions TAB SAB Ect Mult Living                 Review of Systems  Constitutional: Negative for fever and chills.  HENT: Negative for congestion and sore throat.   Eyes: Negative for visual disturbance.  Respiratory: Positive for cough, sputum production and shortness of breath. Negative for wheezing.   Cardiovascular: Negative for chest pain.  Gastrointestinal: Positive for abdominal distention. Negative for nausea, vomiting, abdominal pain, diarrhea and constipation.  Genitourinary: Negative for dysuria, difficulty urinating and vaginal pain.  Musculoskeletal: Negative for arthralgias and myalgias.  Skin: Negative for  rash.  Neurological: Negative for syncope and headaches.  Psychiatric/Behavioral: Negative for behavioral problems.  All other systems reviewed and are negative.     Allergies  Sulfonamide derivatives; Ciprofloxacin; and Wheat bran  Home Medications   Prior to Admission medications   Medication Sig Start Date End Date Taking? Authorizing Provider  FLUoxetine (PROZAC) 40 MG capsule Take 40 mg by mouth every morning.    Yes Historical Provider, MD  gabapentin (NEURONTIN) 600 MG tablet Take 600 mg by mouth 4 (four) times daily.   Yes Historical Provider, MD  iron polysaccharides (POLY-IRON 150) 150 MG capsule Take 1 capsule (150 mg total) by mouth 2 (two) times daily. 09/05/13  Yes Hilarie FredricksonJohn N Perry, MD  lactulose (CHRONULAC) 10 GM/15ML solution Take 30 g by mouth See admin instructions. May take up to 4 times daily. Patient states she usually takes once to twice daily.   Yes Historical Provider, MD  loratadine (CLARITIN) 10 MG tablet Take 10 mg by mouth daily.   Yes Historical Provider, MD  mesalamine (LIALDA) 1.2 G EC tablet Take 1.2 g by mouth 2 (two) times daily.   Yes Historical Provider, MD  Multiple Vitamins-Minerals (MULTIVITAMIN WITH MINERALS) tablet Take 1 tablet by mouth daily.   Yes Historical Provider, MD  Nutritional Supplements (ENSURE PO) Take 237 mLs by mouth 3 (three) times daily.    Yes Historical Provider, MD  omeprazole (PRILOSEC) 40 MG capsule Take 40 mg by mouth at bedtime.    Yes Historical Provider, MD  pentosan polysulfate (ELMIRON) 100 MG capsule Take 200 mg by mouth 2 (two) times daily.    Yes Historical Provider, MD  rifaximin (XIFAXAN) 550 MG TABS tablet Take 1 tablet (550 mg total) by mouth 2 (two) times daily. 10/27/13  Yes Amy S Esterwood, PA-C  Saxagliptin-Metformin 10-998 MG TB24 Take 1 tablet by mouth daily. 02/09/14  Yes Sheliah HatchKatherine E Tabori, MD  topiramate (TOPAMAX) 25 MG tablet Take 25 mg by mouth 2 (two) times daily.   Yes Historical Provider, MD  traZODone  (DESYREL) 50 MG tablet Take 50 mg by mouth at bedtime.   Yes Historical Provider, MD   BP 121/58  Pulse 86  Temp(Src) 98.2 F (36.8 C) (Oral)  Resp 18  SpO2 97% Physical Exam  Vitals reviewed. Constitutional: She is oriented to person, place, and time. She appears well-developed and well-nourished. No distress.  HENT:  Head: Normocephalic and atraumatic.  Eyes: EOM are normal.  Neck: Normal range of motion.  Cardiovascular: Normal rate and regular rhythm.   Murmur heard.  Systolic murmur is present with a grade of 4/6  Pulmonary/Chest: Effort normal. No respiratory distress. She has decreased breath sounds in the right middle field and the right lower field. She has no wheezes. She has no rhonchi.  Abdominal: Soft. Bowel sounds are normal. She exhibits distension and  ascites. There is no tenderness.  Musculoskeletal: She exhibits no edema.  Neurological: She is alert and oriented to person, place, and time.  Skin: She is not diaphoretic.  Psychiatric: She has a normal mood and affect. Her behavior is normal.    ED Course  Procedures (including critical care time) Labs Review Labs Reviewed  CBC WITH DIFFERENTIAL - Abnormal; Notable for the following:    WBC 13.1 (*)    RBC 3.42 (*)    HCT 33.4 (*)    MCH 35.4 (*)    MCHC 36.2 (*)    RDW 16.9 (*)    Neutro Abs 8.7 (*)    Monocytes Absolute 1.6 (*)    All other components within normal limits  COMPREHENSIVE METABOLIC PANEL - Abnormal; Notable for the following:    Sodium 129 (*)    CO2 17 (*)    Glucose, Bld 202 (*)    Total Protein 8.9 (*)    Albumin 2.4 (*)    AST 146 (*)    ALT 62 (*)    Alkaline Phosphatase 143 (*)    Total Bilirubin 3.4 (*)    All other components within normal limits  AMMONIA - Abnormal; Notable for the following:    Ammonia 92 (*)    All other components within normal limits  PRO B NATRIURETIC PEPTIDE  TROPONIN I  I-STAT TROPOININ, ED    Imaging Review Dg Chest 2 View  04/18/2014    CLINICAL DATA:  Shortness of breath.  EXAM: CHEST  2 VIEW  COMPARISON:  April 06, 2014.  FINDINGS: The heart size and mediastinal contours are within normal limits. Left lung is clear. Stable moderate size right pleural effusion is noted with possible underlying atelectasis. No pneumothorax is noted. The visualized skeletal structures are unremarkable.  IMPRESSION: Stable moderate size right pleural effusion with probable underlying atelectasis.   Electronically Signed   By: Roque Lias M.D.   On: 04/18/2014 14:32     EKG Interpretation   Date/Time:  Tuesday April 18 2014 13:41:09 EDT Ventricular Rate:  89 PR Interval:  150 QRS Duration: 90 QT Interval:  450 QTC Calculation: 547 R Axis:   36 Text Interpretation:  Normal sinus rhythm Cannot rule out Anterior infarct  , age undetermined Prolonged QT Abnormal ECG No acute changes Confirmed by  Rhunette Croft, MD, Janey Genta [phone]) on 04/18/2014 2:20:21 PM      MDM   Final diagnoses:  Pleural effusion  Hyperammonemia     58 year old female with a history of an NASH, recurrent pleural effusions, systolic heart murmur, and anemia presents with shortness of breath. Patient states that on Thursday she had 2 L of fluid off by thoracentesis at [facility]. Patient states this feels very similar to when she needed a thoracentesis in the past. Patient says her shortness of breath started this morning. Patient does have a cough however there's been going on 3 months. Patient is followed by the liver transplant service at [facility] and she called and told them what was going on and told her to come in she would likely need another thoracentesis and admission. On exam the patient has diminished lung sounds in the right mid and lower lung fields, 4/6 systolic murmur consistent with the patient's history of aortic stenosis. Will get chest x-ray, labs, EKG. Patient likely be admitted for thoracentesis. Patient's workup significant for an elevated ammonia level, right pleural  effusion. We will admit to medicine for thoracentesis.   Beverely Risen, MD 04/18/14 [phone]

## 2014-04-19 ENCOUNTER — Inpatient Hospital Stay (HOSPITAL_COMMUNITY): Payer: 59

## 2014-04-19 DIAGNOSIS — E119 Type 2 diabetes mellitus without complications: Secondary | ICD-10-CM

## 2014-04-19 DIAGNOSIS — K7469 Other cirrhosis of liver: Secondary | ICD-10-CM

## 2014-04-19 LAB — HEPATIC FUNCTION PANEL
ALBUMIN: 2.2 g/dL — AB (ref 3.5–5.2)
ALT: 56 U/L — ABNORMAL HIGH (ref 0–35)
AST: 123 U/L — ABNORMAL HIGH (ref 0–37)
Alkaline Phosphatase: 124 U/L — ABNORMAL HIGH (ref 39–117)
Bilirubin, Direct: 1.4 mg/dL — ABNORMAL HIGH (ref 0.0–0.3)
Indirect Bilirubin: 2.5 mg/dL — ABNORMAL HIGH (ref 0.3–0.9)
TOTAL PROTEIN: 8.3 g/dL (ref 6.0–8.3)
Total Bilirubin: 3.9 mg/dL — ABNORMAL HIGH (ref 0.3–1.2)

## 2014-04-19 LAB — BASIC METABOLIC PANEL
ANION GAP: 14 (ref 5–15)
BUN: 12 mg/dL (ref 6–23)
CHLORIDE: 99 meq/L (ref 96–112)
CO2: 18 meq/L — AB (ref 19–32)
CREATININE: 0.72 mg/dL (ref 0.50–1.10)
Calcium: 9 mg/dL (ref 8.4–10.5)
GFR calc non Af Amer: >90 mL/min (ref >90)
Glucose, Bld: 136 mg/dL — ABNORMAL HIGH (ref 70–99)
POTASSIUM: 3.4 meq/L — AB (ref 3.7–5.3)
Sodium: 131 mEq/L — ABNORMAL LOW (ref 137–147)

## 2014-04-19 LAB — CBC
HEMATOCRIT: 32.1 % — AB (ref 36.0–46.0)
HEMOGLOBIN: 11.8 g/dL — AB (ref 12.0–15.0)
MCH: 35.6 pg — ABNORMAL HIGH (ref 26.0–34.0)
MCHC: 36.8 g/dL — AB (ref 30.0–36.0)
MCV: 97 fL (ref 78.0–100.0)
Platelets: 221 10*3/uL (ref 150–400)
RBC: 3.31 MIL/uL — AB (ref 3.87–5.11)
RDW: 16.7 % — ABNORMAL HIGH (ref 11.5–15.5)
WBC: 10 10*3/uL (ref 4.0–10.5)

## 2014-04-19 LAB — GLUCOSE, CAPILLARY
GLUCOSE-CAPILLARY: 122 mg/dL — AB (ref 70–99)
Glucose-Capillary: 127 mg/dL — ABNORMAL HIGH (ref 70–99)
Glucose-Capillary: 182 mg/dL — ABNORMAL HIGH (ref 70–99)

## 2014-04-19 LAB — PROTIME-INR
INR: 2.35 — ABNORMAL HIGH (ref 0.00–1.49)
PROTHROMBIN TIME: 25.9 s — AB (ref 11.6–15.2)

## 2014-04-19 LAB — AMMONIA: Ammonia: 60 umol/L (ref 11–60)

## 2014-04-19 MED ORDER — POTASSIUM CHLORIDE CRYS ER 20 MEQ PO TBCR
40.0000 meq | EXTENDED_RELEASE_TABLET | Freq: Once | ORAL | Status: AC
  Administered 2014-04-19: 40 meq via ORAL
  Filled 2014-04-19: qty 2

## 2014-04-19 MED ORDER — LIDOCAINE HCL (PF) 1 % IJ SOLN
INTRAMUSCULAR | Status: AC
  Filled 2014-04-19: qty 10

## 2014-04-19 MED ORDER — FUROSEMIDE 40 MG PO TABS: 40.0000 mg | ORAL_TABLET | Freq: Every day | ORAL | Status: AC

## 2014-04-19 MED ORDER — SPIRONOLACTONE 100 MG PO TABS: 100.0000 mg | ORAL_TABLET | Freq: Every day | ORAL | Status: AC

## 2014-04-19 MED ORDER — ENSURE COMPLETE PO LIQD: 237.0000 mL | Freq: Two times a day (BID) | ORAL | Status: DC

## 2014-04-19 NOTE — Procedures (Signed)
Successful US guided right thoracentesis. Yielded 1.8 liters of clear yellow fluid. Pt tolerated procedure well. No immediate complications.  Specimen was not sent for labs. CXR ordered.  Pattricia Boss D PA-C 04/19/2014 10:19 AM

## 2014-04-19 NOTE — Progress Notes (Signed)
INITIAL NUTRITION ASSESSMENT  DOCUMENTATION CODES Per approved criteria  -Severe malnutrition in the context of chronic illness   Pt meets criteria for severe MALNUTRITION in the context of chronic illness as evidenced by severe fat and muscle depletion.  INTERVENTION: -Ensure Complete po BID, each supplement provides 350 kcal and 13 grams of protein -Reinforced principles of low sodium diet with 1.6-1.8 L fluid restriction per MD request  NUTRITION DIAGNOSIS: Inadequate oral intake related to early satiety as evidenced by severe fat and muscle depletion, poor po intake.   Goal: Pt will meet >90% of estimated nutritional needs  Monitor:  PO/supplement intake, labs, weight changes, I/O's  Reason for Assessment: MST=3  58 y.o. female  Admitting Dx: Pleural effusion, right  Sabrina [name] is a 58 y.o. female, with history of cryptogenic liver failure, recurrent right pleural effusion, systolic heart murmur, anemia, diabetes mellitus, presents with complaints of shortness of breath, and reports she recently had 2 L thoracocentesis at [facility] on 10/15, patient has been followed at [facility] as she is being evaluated for liver transplant, patient reports recurrent of shortness of breath this morning which prompted her to come to ED.  ASSESSMENT: Pt s/p thoracenteis today. Spoke with MD in hallway prior to seeing pt; requested this RD reinforce education on low sodium diet with 1.6-1.8 L fluid restriction to help mange liver disease and ascites.  Hx was obtained by pt and husband at bedside. Thjey confirm very poor appetite for several months due to early satiety. She reports "I just don;t feel like eating". Pt completed most of her lunch today- consumed yogurt, banana, and about 75% of a gluten free muffin.  She reveals that she has been having issues with mouth sores and was recently prescribed Magic Mouthwash and is hopeful that this will help her.  She reveals UBW of 124-140# based upon  ascites. Wt has been stable over the past 2-3 months, per documented wt hx.  She reports she consumes 2-3 bottle Ensure daily PTA and would like to continue to have Ensure ordered to help maximize intake. Also discussed importance of adhering to a low sodium diet to help mange fluid retention. Pt and husband very familiar with diet principles, reporting they do not add salt to their food or at the table and have stopped purchasing processed foods and canned foods.  Pt and husband very appreciative of RD visit. CBGs: 122-127. Glucose: 136. Na: 131, K: 3.4, CO2: 18.   Nutrition Focused Physical Exam:  Subcutaneous Fat:  Orbital Region: severe depletion Upper Arm Region: severe depletion Thoracic and Lumbar Region: mild depletion  Muscle:  Temple Region: severe depletion Clavicle Bone Region: severe depletion Clavicle and Acromion Bone Region: severe depletion Scapular Bone Region: severe depletion Dorsal Hand: mild depleiton Patellar Region: WDL Anterior Thigh Region: WDL Posterior Calf Region: WDL  Edema: none present  Height: Ht Readings from Last 1 Encounters:  04/18/14 5\' 4"  (1.626 m)    Weight: Wt Readings from Last 1 Encounters:  04/18/14 135 lb (61.236 kg)    Ideal Body Weight: 120#  % Ideal Body Weight: 113%  Wt Readings from Last 10 Encounters:  04/18/14 135 lb (61.236 kg)  04/06/14 135 lb (61.236 kg)  03/27/14 134 lb 6.4 oz (60.963 kg)  03/22/14 131 lb 12.8 oz (59.784 kg)  03/20/14 132 lb 2 oz (59.932 kg)  03/16/14 129 lb (58.514 kg)  03/15/14 129 lb 8 oz (58.741 kg)  03/10/14 134 lb (60.782 kg)  03/03/14 135 lb 8 oz (61.462  kg)  02/27/14 136 lb (61.689 kg)    Usual Body Weight: 135#  % Usual Body Weight: 100%  BMI:  Body mass index is 23.16 kg/(m^2). Normal weight range  Estimated Nutritional Needs: Kcal: 1800-2000 Protein: 77-87 grams Fluid: 1.8-2.0 L  Skin: stage II pressure ulcer on sacrum   Diet Order: Cardiac  EDUCATION  NEEDS: -Education needs addressed   Intake/Output Summary (Last 24 hours) at 04/19/14 1009 Last data filed at 04/18/14 2318  Gross per 24 hour  Intake      0 ml  Output      0 ml  Net      0 ml    Last BM: 04/18/14  Labs:   Recent Labs Lab 04/18/14 1351 04/19/14 0600  NA 129* 131*  K 4.3 3.4*  CL 97 99  CO2 17* 18*  BUN 10 12  CREATININE 0.66 0.72  CALCIUM 8.9 9.0  GLUCOSE 202* 136*    CBG (last 3)   Recent Labs  04/18/14 2132 04/19/14 0749 04/19/14 0853  GLUCAP 126* 127* 122*    Scheduled Meds: . FLUoxetine  40 mg Oral q morning - 10a  . gabapentin  600 mg Oral QID  . insulin aspart  0-9 Units Subcutaneous TID WC  . iron polysaccharides  150 mg Oral BID  . lactulose  30 g Oral QID  . lidocaine (PF)      . loratadine  10 mg Oral Daily  . mesalamine  1.2 g Oral BID  . pantoprazole  40 mg Oral Daily  . pentosan polysulfate  200 mg Oral BID  . rifaximin  550 mg Oral BID  . senna  1 tablet Oral BID  . topiramate  25 mg Oral BID  . traZODone  50 mg Oral QHS    Continuous Infusions:   Past Medical History  Diagnosis Date  . Arthritis   . Allergic rhinitis   . Colitis   . Degenerative disc disease   . Hyperlipidemia   . Hypertension   . Celiac disease   . Bipolar disorder   . Macular degeneration   . Interstitial cystitis   . Heart murmur   . Anxiety   . Depression   . Liver disease   . Gastric ulcer   . TIA (transient ischemic attack) 06/2013; ?10/13/2013  . Anemia, iron deficiency 10/30/2012  . History of blood transfusion     "maybe 3-4; related to low counts" 04/18/2014)  . Hepatic encephalopathy   . Crohn's disease   . Cirrhosis   . Stroke   . Bronchitis 02/22/2014    seen by Dr. Beverely Lowabori, treated /w antibiotic  but as of 02/27/2014- sympytoms have resolved.   . Type II diabetes mellitus   . Pleural effusion, right 04/18/2014    Past Surgical History  Procedure Laterality Date  . Appendectomy    . Lumbar laminectomy    . Carpal  tunnel release Bilateral   . Cesarean section  1983  . Cysto/ hod/ instillatio clorpactin  07-03-2008  &  06-09-2001    INTERSTITIAL CYSTITIS  . Left shoulder /   including capsulectomy with debridement/  sad with resection of ac Left 01-25-2008  . Right shoulder arthroscopy/ open resection distal clavicle/ debridement adhesive capsulitis/ rotator cuff repair Right 08-30-2003  . Tonsillectomy and adenoidectomy    . Transthoracic echocardiogram  03-28-2011    NORMAL LVSF/ EF 60-65%/ VERY MILD AORTIC STENOSIS, TRIVIAL REGURG./ MILDLY DILATED LEFT ATRIUM  . Cystoscopy  11/26/2011  Procedure: CYSTOSCOPY;  Surgeon: Valetta Fuller, MD;  Location: [facility];  Service: Urology;  Laterality: N/A;  clorpactin  in bladder  . 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 Cholecystectomy  12/2012  . Tubal ligation  1983  . Back surgery    . Artery biopsy Left 02/27/2014    Procedure: BIOPSY TEMPORAL ARTERY;  Surgeon: Sherren Kerns, MD;  Location: [facility] OR;  Service: Vascular;  Laterality: Left;  . Carpal tunnel release Bilateral     Risa Auman A. Mayford Knife, RD, LDN Pager: [phone] After hours Pager: [phone]

## 2014-04-19 NOTE — Progress Notes (Signed)
Nsg Discharge Note  Admit Date:  04/18/2014 Discharge date: 04/19/2014   Sabrina [name] to be D/C'd Home per MD order.  AVS completed.  Copy for chart, and copy for patient signed, and dated. Patient/caregiver able to verbalize understanding.  Discharge Medication:   Medication List         ENSURE PO  Take 237 mLs by mouth 3 (three) times daily.     FLUoxetine 40 MG capsule  Commonly known as:  PROZAC  Take 40 mg by mouth every morning.     furosemide 40 MG tablet  Commonly known as:  LASIX  Take 1 tablet (40 mg total) by mouth daily.     gabapentin 600 MG tablet  Commonly known as:  NEURONTIN  Take 600 mg by mouth 4 (four) times daily.     iron polysaccharides 150 MG capsule  Commonly known as:  POLY-IRON 150  Take 1 capsule (150 mg total) by mouth 2 (two) times daily.     lactulose 10 GM/15ML solution  Commonly known as:  CHRONULAC  Take 30 g by mouth See admin instructions. May take up to 4 times daily. Patient states she usually takes once to twice daily.     loratadine 10 MG tablet  Commonly known as:  CLARITIN  Take 10 mg by mouth daily.     mesalamine 1.2 G EC tablet  Commonly known as:  LIALDA  Take 1.2 g by mouth 2 (two) times daily.     multivitamin with minerals tablet  Take 1 tablet by mouth daily.     omeprazole 40 MG capsule  Commonly known as:  PRILOSEC  Take 40 mg by mouth at bedtime.     pentosan polysulfate 100 MG capsule  Commonly known as:  ELMIRON  Take 200 mg by mouth 2 (two) times daily.     rifaximin 550 MG Tabs tablet  Commonly known as:  XIFAXAN  Take 1 tablet (550 mg total) by mouth 2 (two) times daily.     Saxagliptin-Metformin 10-998 MG Tb24  Take 1 tablet by mouth daily.     spironolactone 100 MG tablet  Commonly known as:  ALDACTONE  Take 1 tablet (100 mg total) by mouth daily.     topiramate 25 MG tablet  Commonly known as:  TOPAMAX  Take 25 mg by mouth 2 (two) times daily.     traZODone 50 MG tablet  Commonly  known as:  DESYREL  Take 50 mg by mouth at bedtime.        Discharge Assessment: Filed Vitals:   04/19/14 1401  BP: 116/45  Pulse: 81  Temp: 97.5 F (36.4 C)  Resp: 16   Skin clean, dry and intact, foam and barrier cream applied to sacrum area.  IV catheter discontinued intact. Site without signs and symptoms of complications - no redness or edema noted at insertion site, patient denies c/o pain - only slight tenderness at site.  Dressing with slight pressure applied.  D/c Instructions-Education: Discharge instructions given to patient/family with verbalized understanding. D/c education completed with patient/family including follow up instructions, medication list, d/c activities limitations if indicated, with other d/c instructions as indicated by MD - patient able to verbalize understanding, all questions fully answered. Patient instructed to return to ED, call 911, or call MD for any changes in condition.  Patient escorted via WC, and D/C home via private auto.  Kern Reap, RN 04/19/2014 2:25 PM

## 2014-04-19 NOTE — Discharge Summary (Signed)
Physician Discharge Summary  Sabrina [name] WUJ:[id number] DOB: [birth year] DOA: 04/18/2014  PCP: Neena Rhymes, MD  Admit date: 04/18/2014 Discharge date: 04/19/2014  Time spent: 35 minutes  Recommendations for Outpatient Follow-up:  Fluid restrict BMP 1 week -restarted lasix/aldactone per transplant surgeon  Discharge Diagnoses:  Principal Problem:   Pleural effusion, right Active Problems:   Essential hypertension   Bipolar disorder   Cirrhosis of liver   Diabetes mellitus type 2, noninsulin dependent   Abnormal LFTs   Hyperammonemia   Discharge Condition: improved  Diet recommendation: low NA; fluid restrict  Filed Weights   04/18/14 1802  Weight: 61.236 kg (135 lb)    History of present illness:  Sabrina [name] is a 58 y.o. female, with history of cryptogenic liver failure, recurrent right pleural effusion, systolic heart murmur, anemia, diabetes mellitus, presents with complaints of shortness of breath, and reports she recently had 2 L thoracocentesis at [facility] on 10/15, patient has been followed at [facility] as she is being evaluated for liver transplant, patient reports recurrent of shortness of breath this morning which prompted her to come to ED, reports it's mainly supine, patient x-ray showing a moderate right lung pleural effusion with dependent atelectasis, hospitalist requested to admit the patient for thoracocentesis, patient workup was significant for elevated LFTs which is her baseline, and elevated ammonia level at 92, she denies any altered mental status or lethargy, she reports normal bowel movements today yet   Hospital Course:  S/p thoracentesis- recently taken off diuretics by [facility] due to hyponatremia- will resume diuretics after speaking with transplant surgeon.  She has appointment next Thursday. Will get BMp before appoitnment  Procedures:  S/p thoracentesis  Consultations:  IR  Discharge Exam: Filed Vitals:   04/19/14 1008  BP: 109/51   Pulse:   Temp:   Resp:     General: A+Ox3, NAd Cardiovascular: rrr Respiratory: clear  Discharge Instructions You were cared for by a hospitalist during your hospital stay. If you have any questions about your discharge medications or the care you received while you were in the hospital after you are discharged, you can call the unit and asked to speak with the hospitalist on call if the hospitalist that took care of you is not available. Once you are discharged, your primary care physician will handle any further medical issues. Please note that NO REFILLS for any discharge medications will be authorized once you are discharged, as it is imperative that you return to your primary care physician (or establish a relationship with a primary care physician if you do not have one) for your aftercare needs so that they can reassess your need for medications and monitor your lab values.  Discharge Instructions   Discharge instructions    Complete by:  As directed   1.8L fluid restriction Weight self daily Low Na diet BMP 1 week     Increase activity slowly    Complete by:  As directed           Current Discharge Medication List    START taking these medications   Details  furosemide (LASIX) 40 MG tablet Take 1 tablet (40 mg total) by mouth daily. Qty: 30 tablet, Refills: 0    spironolactone (ALDACTONE) 100 MG tablet Take 1 tablet (100 mg total) by mouth daily. Qty: 30 tablet, Refills: 0      CONTINUE these medications which have NOT CHANGED   Details  FLUoxetine (PROZAC) 40 MG capsule Take 40 mg by mouth every morning.  gabapentin (NEURONTIN) 600 MG tablet Take 600 mg by mouth 4 (four) times daily.    iron polysaccharides (POLY-IRON 150) 150 MG capsule Take 1 capsule (150 mg total) by mouth 2 (two) times daily. Qty: 180 capsule, Refills: 3    lactulose (CHRONULAC) 10 GM/15ML solution Take 30 g by mouth See admin instructions. May take up to 4 times daily. Patient states she  usually takes once to twice daily.    loratadine (CLARITIN) 10 MG tablet Take 10 mg by mouth daily.    mesalamine (LIALDA) 1.2 G EC tablet Take 1.2 g by mouth 2 (two) times daily.    Multiple Vitamins-Minerals (MULTIVITAMIN WITH MINERALS) tablet Take 1 tablet by mouth daily.    Nutritional Supplements (ENSURE PO) Take 237 mLs by mouth 3 (three) times daily.     omeprazole (PRILOSEC) 40 MG capsule Take 40 mg by mouth at bedtime.     pentosan polysulfate (ELMIRON) 100 MG capsule Take 200 mg by mouth 2 (two) times daily.     rifaximin (XIFAXAN) 550 MG TABS tablet Take 1 tablet (550 mg total) by mouth 2 (two) times daily. Qty: 60 tablet, Refills: 11    Saxagliptin-Metformin 10-998 MG TB24 Take 1 tablet by mouth daily. Qty: 30 tablet, Refills: 6    topiramate (TOPAMAX) 25 MG tablet Take 25 mg by mouth 2 (two) times daily.    traZODone (DESYREL) 50 MG tablet Take 50 mg by mouth at bedtime.       Allergies  Allergen Reactions  . Sulfonamide Derivatives Other (See Comments)    Classic angioedema reaction  . Ciprofloxacin Other (See Comments)    Mouth ulcers  . Wheat Bran Other (See Comments)    Celiac's disease      The results of significant diagnostics from this hospitalization (including imaging, microbiology, ancillary and laboratory) are listed below for reference.    Significant Diagnostic Studies: Dg Chest 1 View  04/19/2014   CLINICAL DATA:  Post RIGHT thoracentesis with removal of 1.8 L of fluid  EXAM: CHEST - 1 VIEW  COMPARISON:  04/18/2014  FINDINGS: Decrease in RIGHT pleural effusion and basilar atelectasis versus prior exam.  No pneumothorax.  Upper normal heart size.  Mediastinal contours and pulmonary vascularity normal.  Minimal peribronchial thickening.  No acute infiltrate or LEFT pleural effusion.  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  IMPRESSION: Decrease in RIGHT pleural effusion and basilar atelectasis post thoracentesis.  No  pneumothorax.   Electronically Signed   By: Ulyses SouthwardMark  Boles M.D.   On: 04/19/2014 11:26   Dg Chest 2 View  04/18/2014   CLINICAL DATA:  Shortness of breath.  EXAM: CHEST  2 VIEW  COMPARISON:  April 06, 2014.  FINDINGS: The heart size and mediastinal contours are within normal limits. Left lung is clear. Stable moderate size right pleural effusion is noted with possible underlying atelectasis. No pneumothorax is noted. The visualized skeletal structures are unremarkable.  IMPRESSION: Stable moderate size right pleural effusion with probable underlying atelectasis.   Electronically Signed   By: Roque LiasJames  Green M.D.   On: 04/18/2014 14:32   Dg Chest 2 View  04/06/2014   CLINICAL DATA:  Cough.  Subsequent encounter.  EXAM: CHEST  2 VIEW  COMPARISON:  02/28/2014  FINDINGS: Moderate to large right pleural effusion which is increased from September 2015 comparison. There are underlying air bronchograms that is presumably atelectasis given similar appearance to previous study and chest CT. The left lung is well aerated. Non obscured cardiomediastinal  silhouette normal in size and morphology. No pneumothorax.  Surgical changes in the epigastrium and left upper quadrant.  IMPRESSION: Moderate to large right pleural effusion, increased from September 2015.   Electronically Signed   By: Tiburcio Pea M.D.   On: 04/06/2014 15:29   Mr Laqueta Jean ZO Contrast  04/10/2014     [facility] [address], [address] [place], [place] [address] [phone]  NEUROIMAGING REPORT   STUDY DATE: 04/06/2014 PATIENT NAME: SHANTANA CHRISTON DOB: 1955-07-14 MRN: [id number]  ORDERING CLINICIAN: Dr Pearlean Brownie CLINICAL HISTORY:  56 year patient with facial neuralgic pain COMPARISON FILMS:  CT Head 03/10/2014 EXAM:MRI Brain w/wo TECHNIQUE: MRI of the brain with and without contrast was obtained  utilizing 5 mm axial slices with T1, T2, T2 flair, T2 star gradient echo  and diffusion weighted views.  T1 sagittal, T2 coronal and  postcontrast  views in the axial and coronal plane were obtained. Thin T1 and T2 axial  and coronals fat saturation images for pain prior to and after contrast  administration through the brainstem as per trigeminal nerve protocol CONTRAST: 12 mL IV Magnevist  IMAGING SITE: [facility] FINDINGS:  The brain parenchyma shows mild scattered periventricular and subcortical  nonspecific white matter hyperintensities likely due to changes of chronic  microvascular ischemia. No structural lesion, tumor or infarction noted.  Thin sections through the brainstem do not reveal any structural  abnormality. Trigeminal nerves show normal appearance.No abnormal lesions  are seen on diffusion-weighted views to suggest acute ischemia. The  cortical sulci, fissures and cisterns are normal in size and appearance.  Lateral, third and fourth ventricle are normal in size and appearance. No  extra-axial fluid collections are seen. No evidence of mass effect or  midline shift.  No abnormal lesions are seen on post contrast views.  On  sagittal views the posterior fossa, pituitary gland and corpus callosum  are unremarkable. No evidence of intracranial hemorrhage on gradient-echo  views. The orbits and their contents, paranasal sinuses and calvarium are  unremarkable.  Intracranial flow voids are present.      04/10/2014    Unremarkable MRI scan of the brain with thin sections through  the brainstem   INTERPRETING PHYSICIAN:  PRAMOD SETHI, MD Certified in  Neuroimaging by American Society of Neuroimaging and [employer]     Microbiology: No results found for this or any previous visit (from the past 240 hour(s)).   Labs: Basic Metabolic Panel:  Recent Labs Lab 04/18/14 1351 04/19/14 0600  NA 129* 131*  K 4.3 3.4*  CL 97 99  CO2 17* 18*  GLUCOSE 202* 136*  BUN 10 12  CREATININE 0.66 0.72  CALCIUM 8.9 9.0   Liver Function Tests:  Recent Labs Lab 04/18/14 1351  04/19/14 0600  AST 146* 123*  ALT 62* 56*  ALKPHOS 143* 124*  BILITOT 3.4* 3.9*  PROT 8.9* 8.3  ALBUMIN 2.4* 2.2*   No results found for this basename: LIPASE, AMYLASE,  in the last 168 hours  Recent Labs Lab 04/18/14 1510 04/19/14 0600  AMMONIA 92* 60   CBC:  Recent Labs Lab 04/18/14 1351 04/19/14 0600  WBC 13.1* 10.0  NEUTROABS 8.7*  --   HGB 12.1 11.8*  HCT 33.4* 32.1*  MCV 97.7 97.0  PLT 230 221   Cardiac Enzymes:  Recent Labs Lab 04/18/14 1838  TROPONINI <0.30   BNP: BNP (last 3 results)  Recent Labs  04/18/14 1351  PROBNP  78.9   CBG:  Recent Labs Lab 04/18/14 2132 04/19/14 0749 04/19/14 0853 04/19/14 1145  GLUCAP 126* 127* 122* 182*       Signed:  Sueanne Maniaci  [facility] 04/19/2014, 12:35 PM

## 2014-04-19 NOTE — Progress Notes (Signed)
Patient BP 122/59. Provider on call paged. Will continue to monitor patient.

## 2014-04-19 NOTE — Care Management Note (Signed)
    Page 1 of 1   04/19/2014     2:02:41 PM CARE MANAGEMENT NOTE 04/19/2014  Patient:  Sabrina [name], Sabrina [name]   Account Number:  [id number]  Date Initiated:  04/19/2014  Documentation initiated by:  Letha Cape  Subjective/Objective Assessment:   dx pl effusion     Action/Plan:   Anticipated DC Date:  04/19/2014   Anticipated DC Plan:  HOME/SELF CARE      DC Planning Services  CM consult      Choice offered to / List presented to:             Status of service:  Completed, signed off Medicare Important Message given?  NO (If response is "NO", the following Medicare IM given date fields will be blank) Date Medicare IM given:   Medicare IM given by:   Date Additional Medicare IM given:   Additional Medicare IM given by:    Discharge Disposition:  HOME/SELF CARE  Per UR Regulation:  Reviewed for med. necessity/level of care/duration of stay  If discussed at Long Length of Stay Meetings, dates discussed:    Comments:  04/19/14 1219 Letha Cape RN, BSN [phone] patient lives with spouse.  NCM will continue to follow for dc needs.

## 2014-04-21 ENCOUNTER — Ambulatory Visit: Payer: 59 | Admitting: Medical

## 2014-04-21 ENCOUNTER — Telehealth: Payer: Self-pay | Admitting: Internal Medicine

## 2014-04-21 ENCOUNTER — Other Ambulatory Visit: Payer: Self-pay

## 2014-04-21 DIAGNOSIS — K7469 Other cirrhosis of liver: Secondary | ICD-10-CM

## 2014-04-21 NOTE — Telephone Encounter (Signed)
Pt is having a bmet next week and needs to have an ammonia level checked also. Had thoracentesis this week and will be seen at [facility] on Thursday. Order in epic.

## 2014-04-21 NOTE — ED Provider Notes (Signed)
I saw and evaluated the patient, reviewed the resident's note and I agree with the findings and plan.  Rieley Khalsa T Keyandre Pileggi, MD 04/21/14 1142 

## 2014-04-26 ENCOUNTER — Other Ambulatory Visit (INDEPENDENT_AMBULATORY_CARE_PROVIDER_SITE_OTHER): Payer: 59

## 2014-04-26 ENCOUNTER — Other Ambulatory Visit: Payer: Self-pay

## 2014-04-26 DIAGNOSIS — K7460 Unspecified cirrhosis of liver: Secondary | ICD-10-CM

## 2014-04-26 DIAGNOSIS — K7469 Other cirrhosis of liver: Secondary | ICD-10-CM

## 2014-04-26 DIAGNOSIS — R188 Other ascites: Principal | ICD-10-CM

## 2014-04-26 LAB — HEPATIC FUNCTION PANEL
ALBUMIN: 2 g/dL — AB (ref 3.5–5.2)
ALT: 56 U/L — ABNORMAL HIGH (ref 0–35)
AST: 109 U/L — AB (ref 0–37)
Alkaline Phosphatase: 134 U/L — ABNORMAL HIGH (ref 39–117)
Bilirubin, Direct: 1.2 mg/dL — ABNORMAL HIGH (ref 0.0–0.3)
Total Bilirubin: 4 mg/dL — ABNORMAL HIGH (ref 0.2–1.2)
Total Protein: 8.2 g/dL (ref 6.0–8.3)

## 2014-04-26 LAB — BASIC METABOLIC PANEL
BUN: 10 mg/dL (ref 6–23)
CHLORIDE: 98 meq/L (ref 96–112)
CO2: 17 mEq/L — ABNORMAL LOW (ref 19–32)
Calcium: 9.2 mg/dL (ref 8.4–10.5)
Creatinine, Ser: 0.9 mg/dL (ref 0.4–1.2)
GFR: 71.58 mL/min (ref >60.00)
GLUCOSE: 302 mg/dL — AB (ref 70–99)
POTASSIUM: 4.9 meq/L (ref 3.5–5.1)
SODIUM: 126 meq/L — AB (ref 135–145)

## 2014-04-26 LAB — PROTIME-INR
INR: 2.7 ratio — ABNORMAL HIGH (ref 0.8–1.0)
Prothrombin Time: 29.2 s — ABNORMAL HIGH (ref 9.6–13.1)

## 2014-04-26 LAB — AMMONIA: Ammonia: 34 umol/L (ref 11–35)

## 2014-04-27 ENCOUNTER — Telehealth: Payer: Self-pay | Admitting: Family Medicine

## 2014-04-27 ENCOUNTER — Telehealth: Payer: Self-pay

## 2014-04-27 NOTE — Telephone Encounter (Signed)
This patient has been evaluated for possible liver transplant. He states she will not be a candidate. There is no social support for her. There are psychosocial factors involved also. His team is setting her up with palliative care in her home. He did discuss with her options such as second opinion. He wants to be sure you are in agreement with him. If you want to discuss any of this with him, call [phone]. Otherwise, he will go forward with the plan.

## 2014-04-27 NOTE — Telephone Encounter (Signed)
Yes I will be the attending 

## 2014-04-27 NOTE — Telephone Encounter (Signed)
Unfortunately, I agree with their opinion. They should move forward as planned

## 2014-04-27 NOTE — Telephone Encounter (Signed)
Caller name: Bronson Ing Relation to pt: [facility] Call back number: [phone]   Reason for call:  Referred for hospice fromDr. Hiyashi.  They want to know if you will be attending physician.  Call back.

## 2014-04-27 NOTE — Telephone Encounter (Signed)
Called back and notified.

## 2014-04-28 ENCOUNTER — Telehealth: Payer: Self-pay | Admitting: Family Medicine

## 2014-04-28 MED ORDER — GUAIFENESIN-CODEINE 100-10 MG/5ML PO SYRP: ORAL_SOLUTION | ORAL | Status: AC

## 2014-04-28 NOTE — Telephone Encounter (Signed)
Pam notified and med filled.

## 2014-04-28 NOTE — Telephone Encounter (Signed)
Ok for Cheratussin cough syrup which has codeine in it but will need to watch for over sedation due to liver dysfunction and the use of narcotic.  61ml q4-6 prn, #275ml

## 2014-04-28 NOTE — Telephone Encounter (Signed)
Caller name: Pam Relation to pt: [facility] Call back [phone]    Reason for call:  Adivise:: pt has dry cough, severe pain in ribs (possible broken rib on right side) when coughing, tried Teselon but did not work.  Open to suggestions.

## 2014-05-01 ENCOUNTER — Telehealth: Payer: Self-pay | Admitting: Internal Medicine

## 2014-05-01 NOTE — Telephone Encounter (Signed)
[facility] Thursday and was told she was no longer a liver transplant candidate, she is to weak. [facility] made a hospice referral and the nurse has been out the past 3-4 days. She states she is to weak to make it to office appts. She will have a Child psychotherapistsocial worker Wed. States she was told she has 6mth or less. Pt wanted to let Dr. Marina GoodellPerry know because she would not be coming for labs. She wanted to thank Dr. Marina GoodellPerry for all his care and was tearful on the phone. Dr. Marina GoodellPerry notified.

## 2014-05-01 NOTE — Telephone Encounter (Signed)
I'm very sorry. Obviously, if we can do anything to help, we are available.

## 2014-05-02 ENCOUNTER — Telehealth: Payer: Self-pay | Admitting: *Deleted

## 2014-05-02 DIAGNOSIS — K7581 Nonalcoholic steatohepatitis (NASH): Secondary | ICD-10-CM

## 2014-05-02 DIAGNOSIS — K7291 Hepatic failure, unspecified with coma: Secondary | ICD-10-CM

## 2014-05-02 DIAGNOSIS — E119 Type 2 diabetes mellitus without complications: Secondary | ICD-10-CM

## 2014-05-02 DIAGNOSIS — K900 Celiac disease: Secondary | ICD-10-CM

## 2014-05-02 DIAGNOSIS — K769 Liver disease, unspecified: Secondary | ICD-10-CM

## 2014-05-02 NOTE — Telephone Encounter (Signed)
Received physician's orders and plan of care via fax from [facility]. Forms forwarded to Dr. Beverely Lowabori. JG//CMA

## 2014-05-03 NOTE — Telephone Encounter (Signed)
Signed forms faxed to [facility]. JG//CMA

## 2014-05-04 ENCOUNTER — Telehealth: Payer: Self-pay | Admitting: General Practice

## 2014-05-04 NOTE — Telephone Encounter (Signed)
I agree that she needs either a pleurocentesis (lung fluid) or abdominal paracentesis (abdominal fluid) but I cannot order these.  She does have a lung doctor- Dr Vassie LollAlva- who could arrange the lung but if it's her abdomen, she would likely need to call GI or go to the ER

## 2014-05-04 NOTE — Telephone Encounter (Signed)
Norma with [facility] called stating that the pt is is breathing distress and is currently on O2. Pt is requesting an abdomin al paracentesis. Please advise?

## 2014-05-04 NOTE — Telephone Encounter (Signed)
Pt states that she become very short of breath without her oxygen and with exertion (even talking for long periods).  Pt states that she's currently sitting up and is only slightly short of breath while on the phone.  Pt states that she has not spoken to Dr. Vassie Loll, but has cancelled all of her appointments with her GI doctor, Dr. Marina Goodell, due to going on hospice.    Spoke with Nelva Bush from [facility] who states that patient is very short of breath.  She feels the cause is due to her ascites.  Nelva Bush stated that will try to get in contact with Dr. Vassie Loll to see if he will be able to assist her, if not, then Sabrina [name] will be advised to go to the ER.

## 2014-05-05 ENCOUNTER — Ambulatory Visit: Payer: 59 | Admitting: Internal Medicine

## 2014-05-31 ENCOUNTER — Telehealth: Payer: Self-pay | Admitting: *Deleted

## 2014-05-31 NOTE — Telephone Encounter (Signed)
Received FMLA paperwork for patient's husband, Greig Castilla, to care for patient. Forms forwarded to Dr. Beverely Low. JG//CMA

## 2014-06-07 NOTE — Telephone Encounter (Signed)
Forms were faxed on 05/31/2014. JG//CMA

## 2014-06-12 ENCOUNTER — Other Ambulatory Visit: Payer: Self-pay | Admitting: Neurology

## 2014-06-26 ENCOUNTER — Telehealth: Payer: Self-pay | Admitting: Family Medicine

## 2014-06-27 NOTE — Telephone Encounter (Signed)
Noted  

## 2014-06-30 NOTE — Telephone Encounter (Signed)
Caller name: norma bennett with hospice Relation to pt: Call back number: [phone] or [phone] Pharmacy:  Reason for call:   Patient passed away this afternoon at 4:30pm

## 2014-06-30 DEATH — deceased

## 2014-08-07 ENCOUNTER — Other Ambulatory Visit: Payer: Self-pay | Admitting: Family

## 2014-08-15 ENCOUNTER — Ambulatory Visit: Payer: 59 | Admitting: Neurology

## 2014-12-25 ENCOUNTER — Other Ambulatory Visit: Payer: Self-pay

## 2015-05-06 IMAGING — CR DG CHEST 1V PORT
1 series · 1 of 1 positions shown · non-contrast
Comparison: DG CHEST 2 VIEW dated 07/21/2013

CLINICAL DATA: Cough.

EXAM:
PORTABLE CHEST - 1 VIEW

[AP]
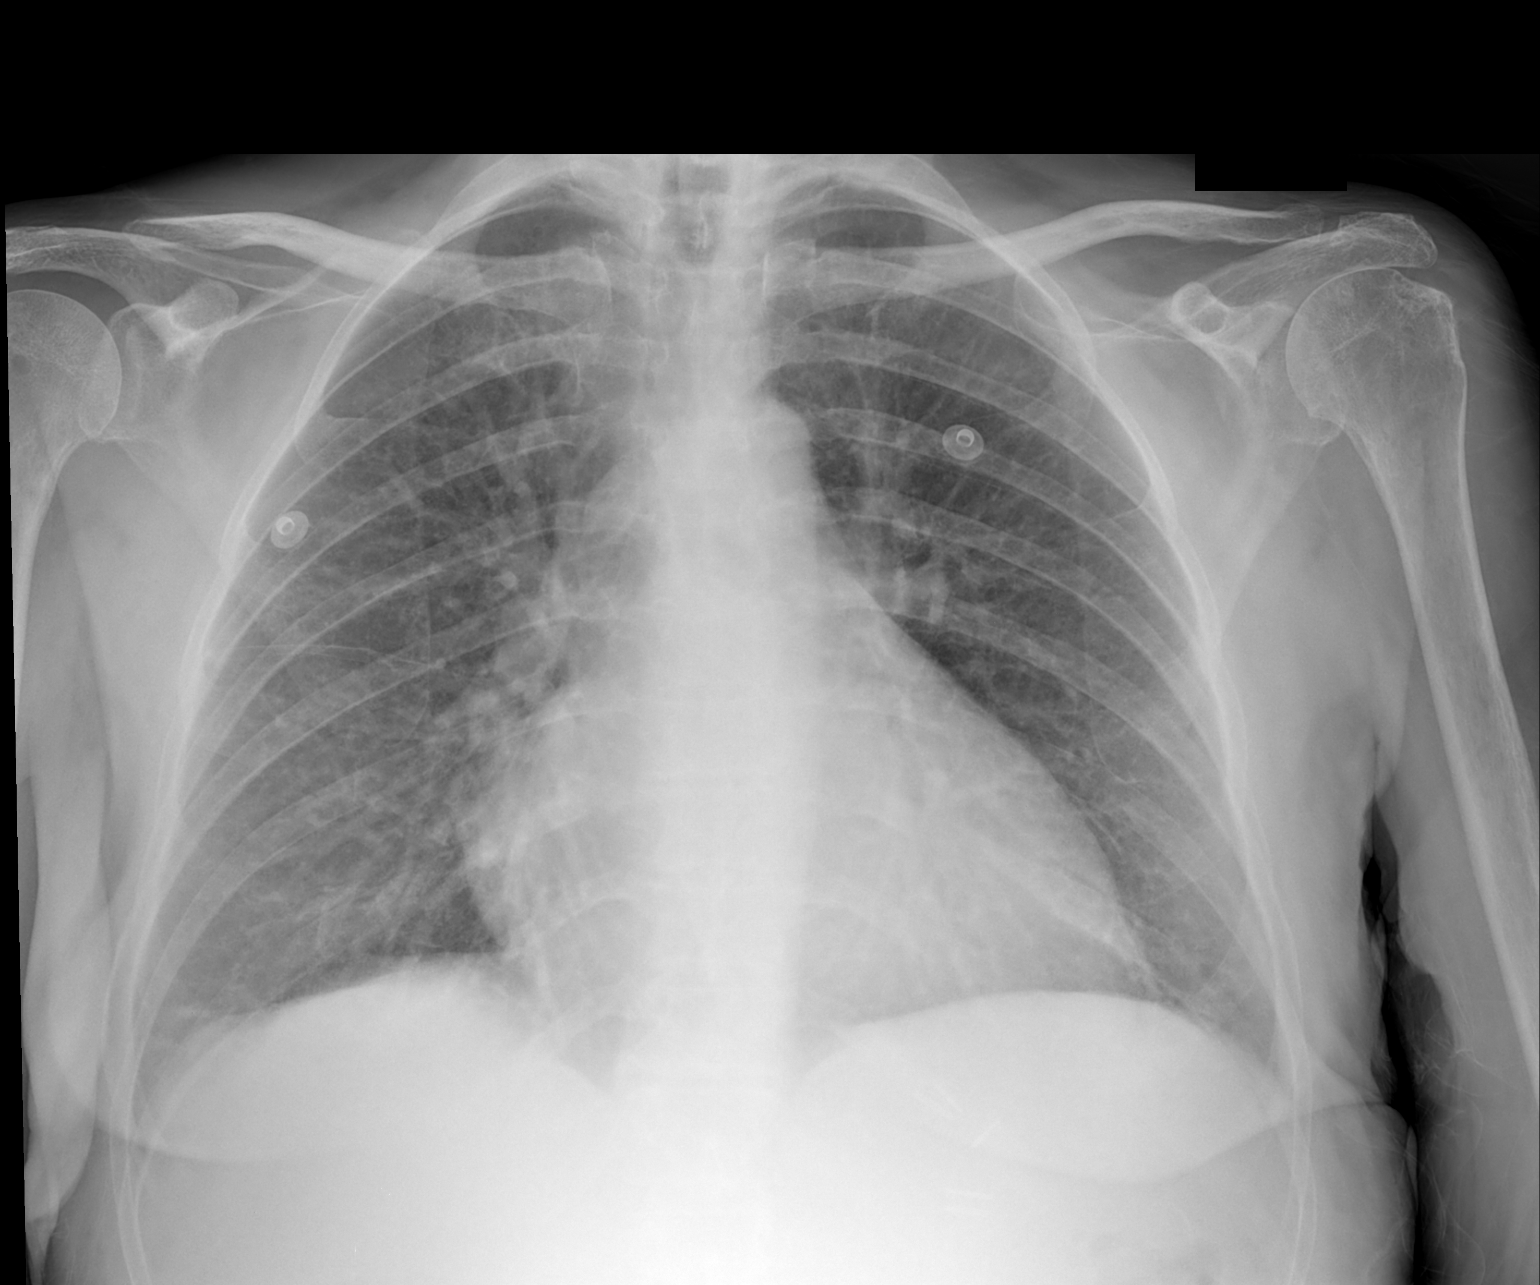

[1 of 1 positions shown; findings below may reference images not displayed]

FINDINGS: Mediastinum and hilar structures normal. Cardiomegaly with mild
pulmonary venous prominence. No pleural effusion or pneumothorax. No
focal pulmonary infiltrate.
IMPRESSION: Cardiomegaly with mild pulmonary venous congestion . No focal
infiltrate .

## 2015-05-12 IMAGING — CT CT HEAD W/O CM
1 series · 15 of 29 positions shown, 19 images · non-contrast
Comparison: 10/07/2013

CLINICAL DATA: Shuffling gait, increased falls, tremors and blurry
vision. Increased confusion. Code stroke.

EXAM:
CT HEAD WITHOUT CONTRAST
TECHNIQUE: Contiguous axial images were obtained from the base of the skull
through the vertex without contrast.

[Series 2: head 5.0 h30s · axial · 0.41mm/px · z∈[+1206,+1336]mm · 15 of 29 slices shown, 19 images]
[im 2/29  brain]
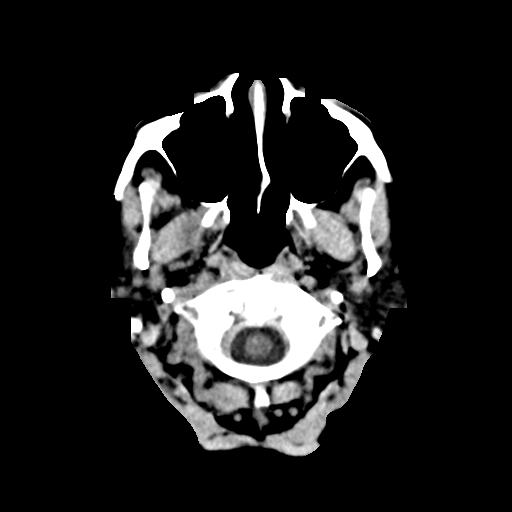
[im 2/29  bone]
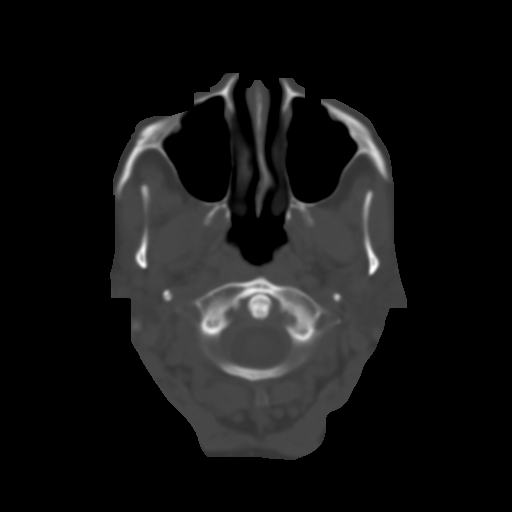
[im 4/29  brain]
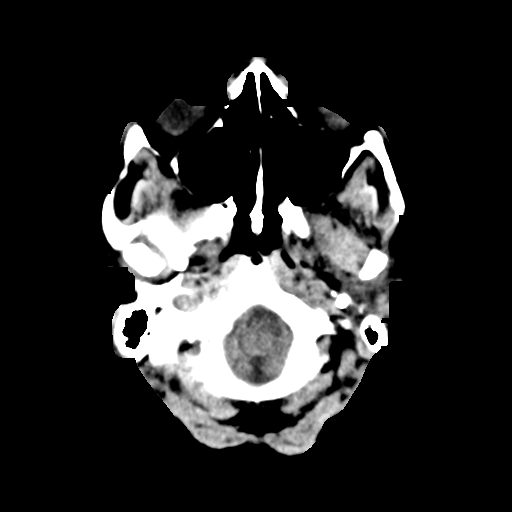
[im 6/29  brain]
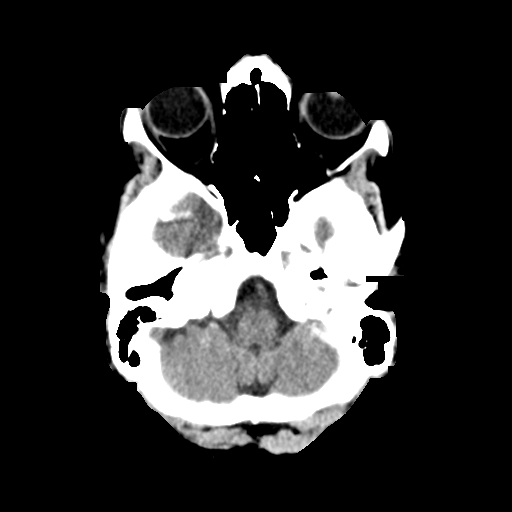
[im 8/29  brain]
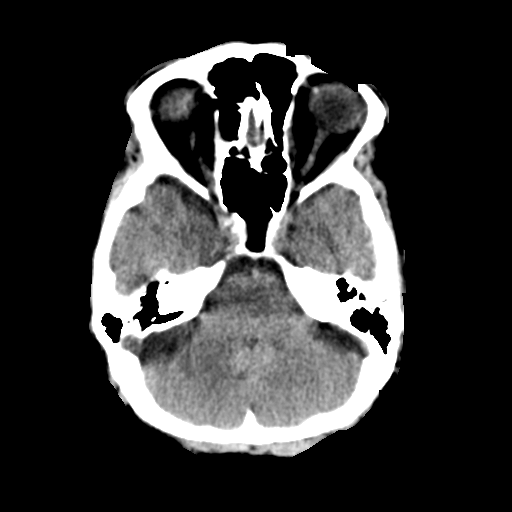
[im 10/29  brain]
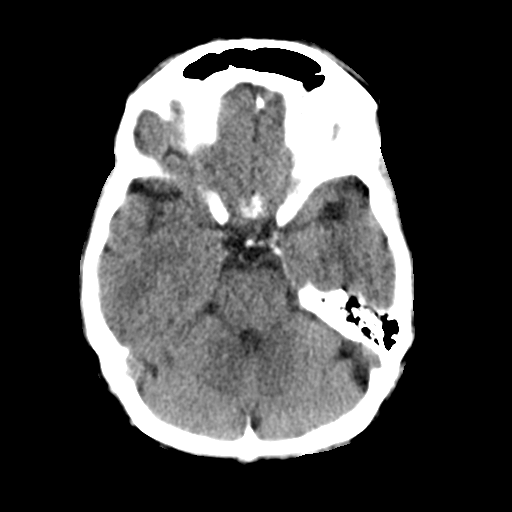
[im 10/29  bone]
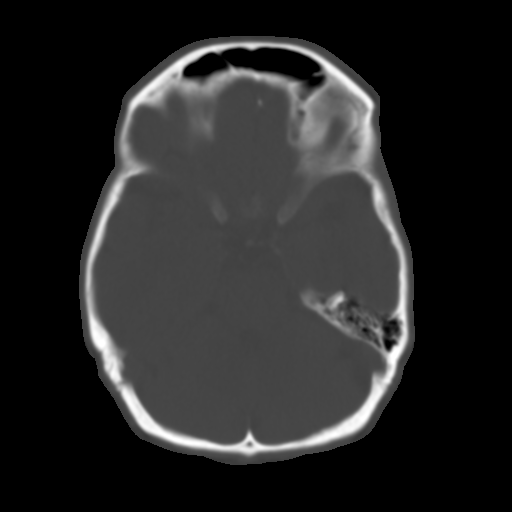
[im 11/29  brain]
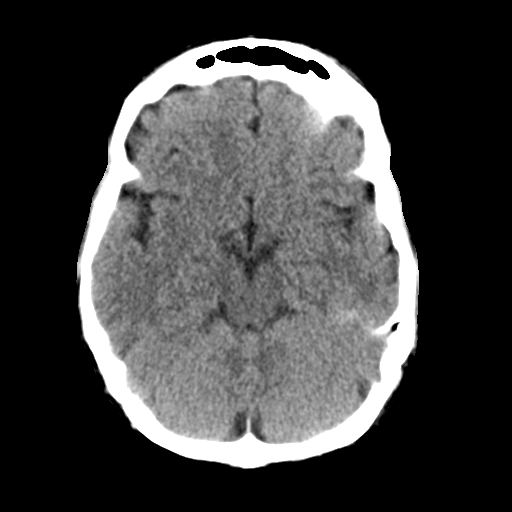
[im 13/29  brain]
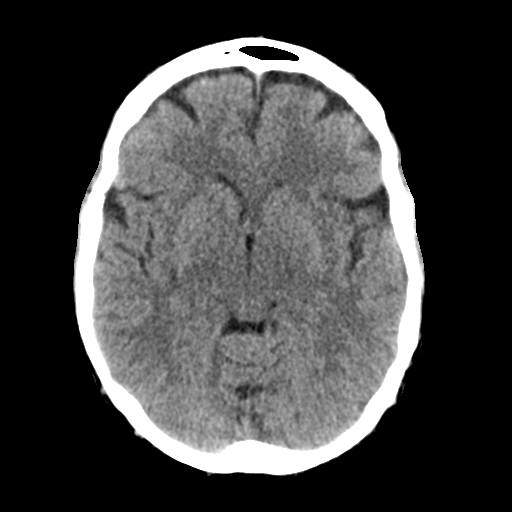
[im 15/29  brain]
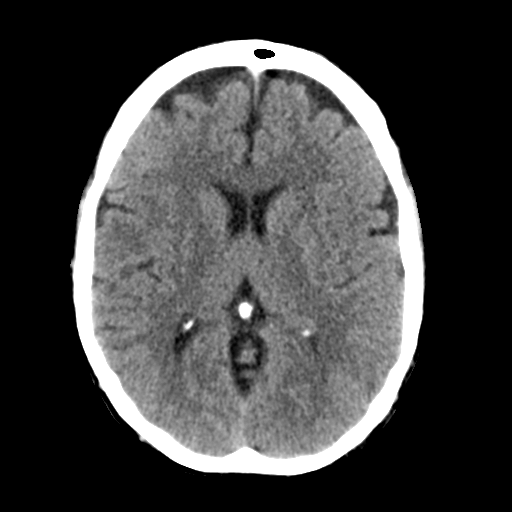
[im 17/29  brain]
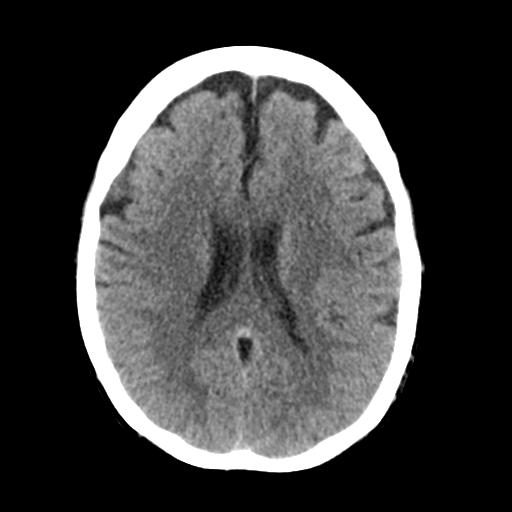
[im 17/29  bone]
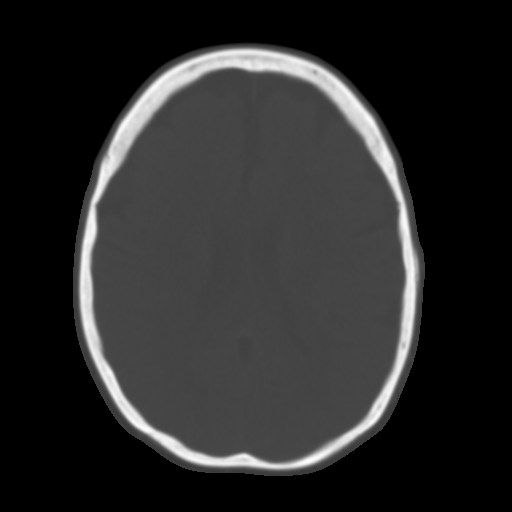
[im 19/29  brain]
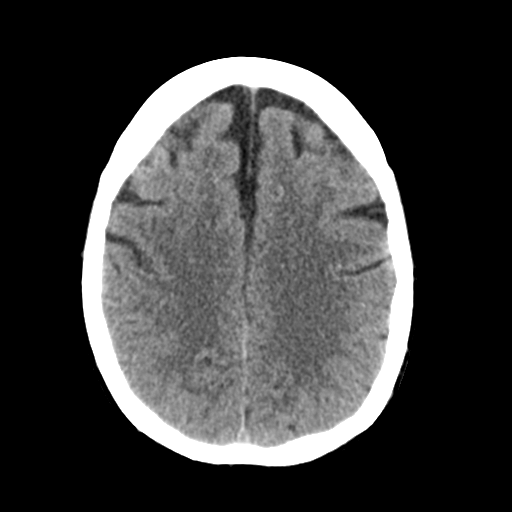
[im 20/29  brain]
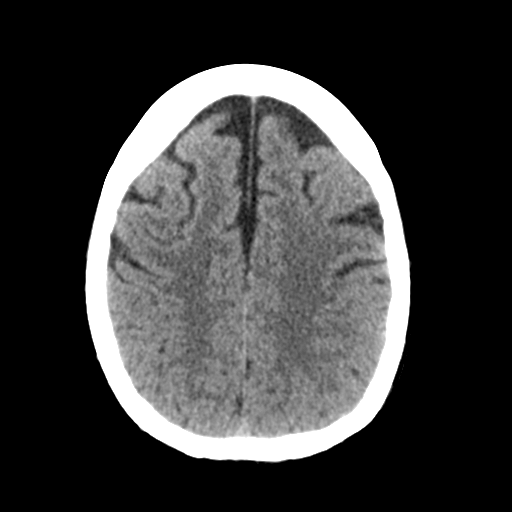
[im 22/29  brain]
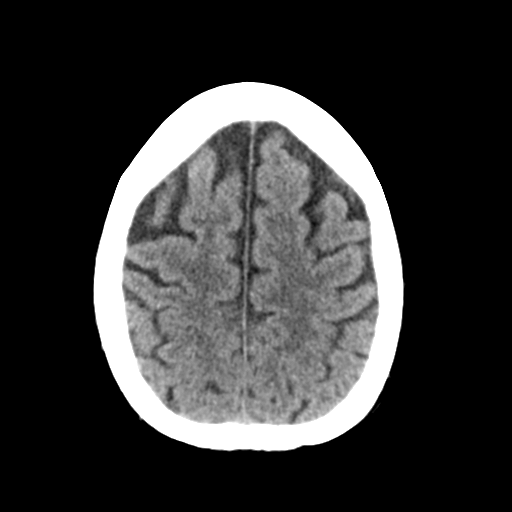
[im 24/29  brain]
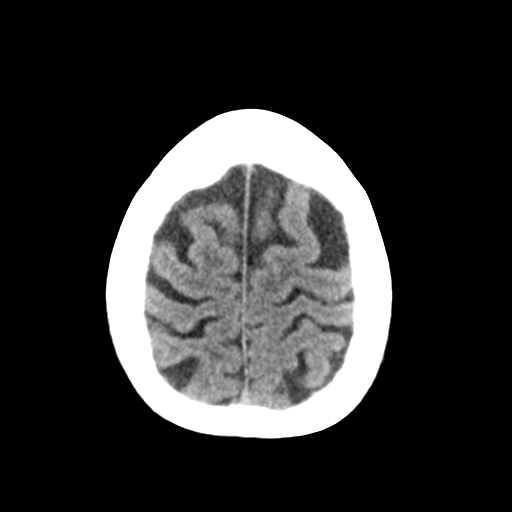
[im 24/29  bone]
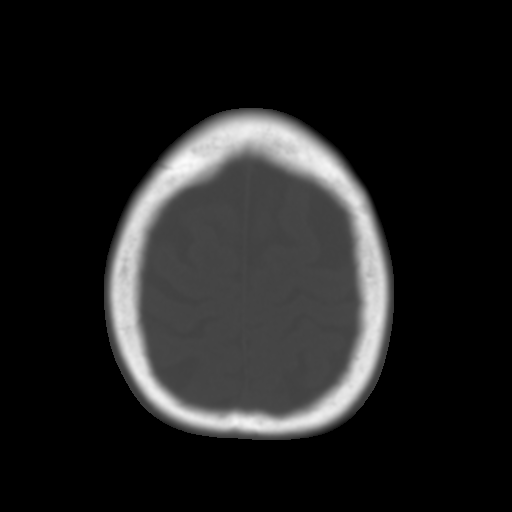
[im 26/29  brain]
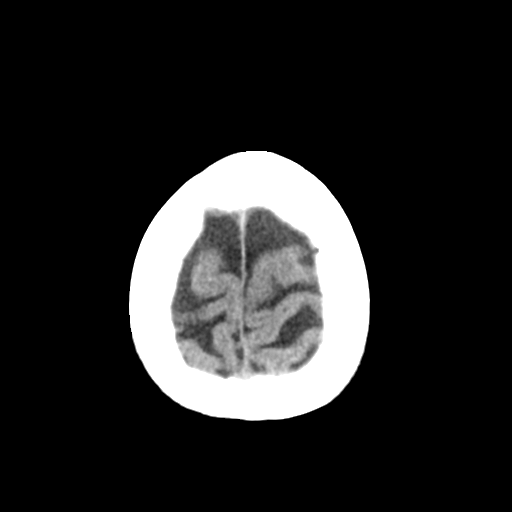
[im 28/29  brain]
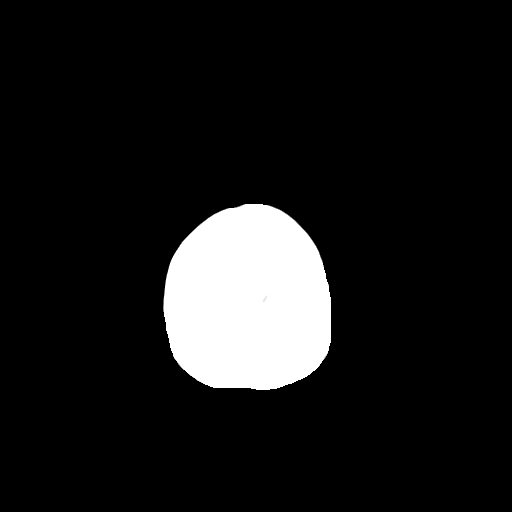

[15 of 29 positions shown; findings below may reference images not displayed]

FINDINGS: No visible hemorrhage or hydrocephalus.  No extra-axial fluid.

Equivocal 1.5 cm diameter asymmetric hypodensity right cerebellum
(image 8 series 2) could represent acute stroke in the appropriate
clinical setting. It is possible this appearance has progressed
compared with the prior normal study of 10/07/2013. Hounsfield
artifact could produce this appearance however. No definite signs of
vertebral or basilar hyperattenuation to suggest posterior
circulation thrombosis.

No hemispheric infarct. No mass lesion or midline shift. Calvarium
intact. Unremarkable orbits, sinuses, and mastoids.
IMPRESSION: Cannot exclude acute right cerebellar infarct. No intracranial
hemorrhage or mass lesion.

Critical Value/emergent results were called by telephone at the time
of interpretation on 10/13/2013 at [DATE] to Dr. Somarib who
verbally acknowledged these results.

## 2015-06-19 NOTE — Telephone Encounter (Signed)
Can you update pt chart, she passed last year.

## 2015-11-16 IMAGING — CR DG CHEST 1V
1 series · 1 of 1 positions shown · non-contrast
Comparison: 04/18/2014

CLINICAL DATA: Post RIGHT thoracentesis with removal of 1.8 L of
fluid

EXAM:
CHEST - 1 VIEW

[w chest pa]
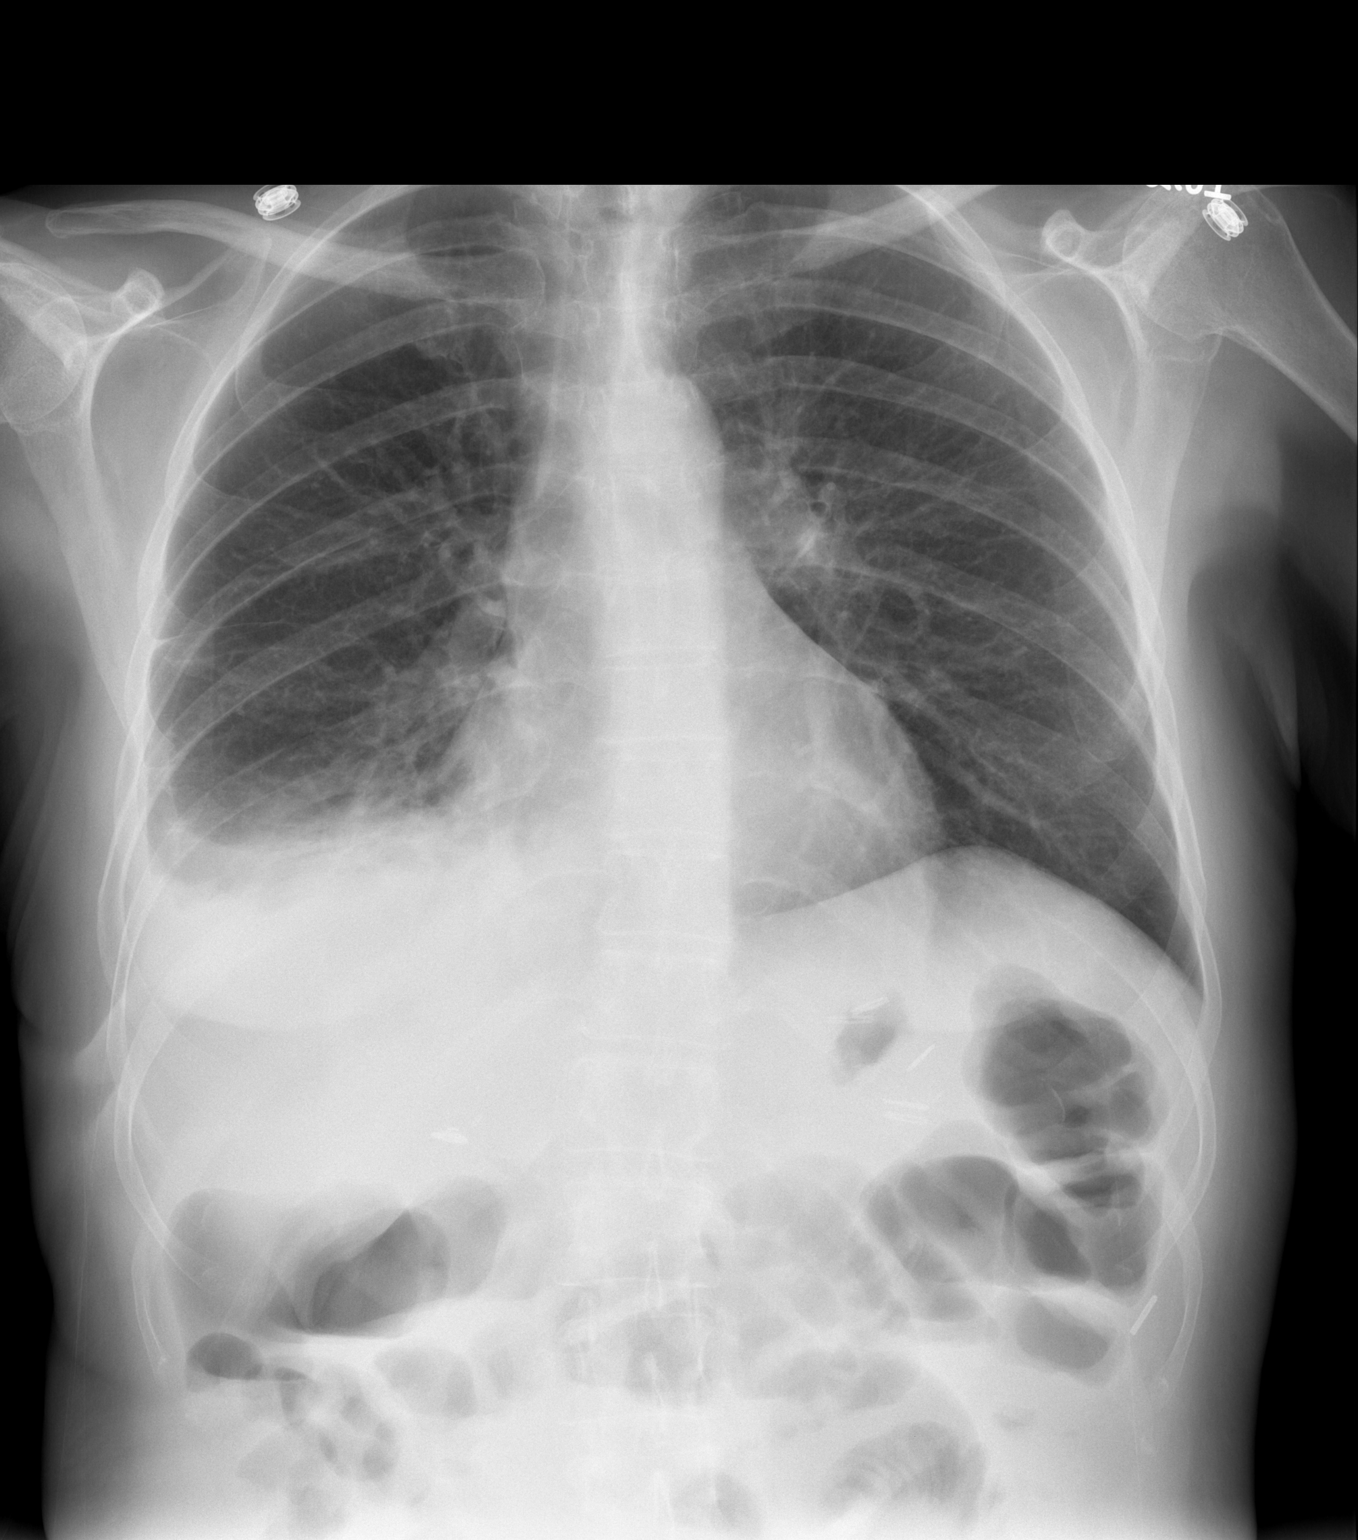

[1 of 1 positions shown; findings below may reference images not displayed]

FINDINGS: Decrease in RIGHT pleural effusion and basilar atelectasis versus
prior exam.

No pneumothorax.

Upper normal heart size.

Mediastinal contours and pulmonary vascularity normal.

Minimal peribronchial thickening.

No acute infiltrate or LEFT pleural effusion.

Prior resection versus resorption of distal RIGHT clavicle.
IMPRESSION: Decrease in RIGHT pleural effusion and basilar atelectasis post
thoracentesis.

No pneumothorax.

## 2015-11-16 IMAGING — US US THORACENTESIS ASP PLEURAL SPACE W/IMG GUIDE
1 series · 8 of 8 positions shown · non-contrast
Comparison: None at [REDACTED], patient states previous 2 L
removed from right side last week at [facility].

MEDICATIONS:
None

COMPLICATIONS:
None immediate

INDICATION: Symptomatic right sided pleural effusion

EXAM:
US THORACENTESIS ASP PLEURAL SPACE W/IMG GUIDE
TECHNIQUE: Informed written consent was obtained from the patient after a
discussion of the risks, benefits and alternatives to treatment. A
timeout was performed prior to the initiation of the procedure.

[Series 1: us thoracentesis asp pleural space w/img guide · 0.27mm/px · 8 of 8 slices shown]
[im 1/8]
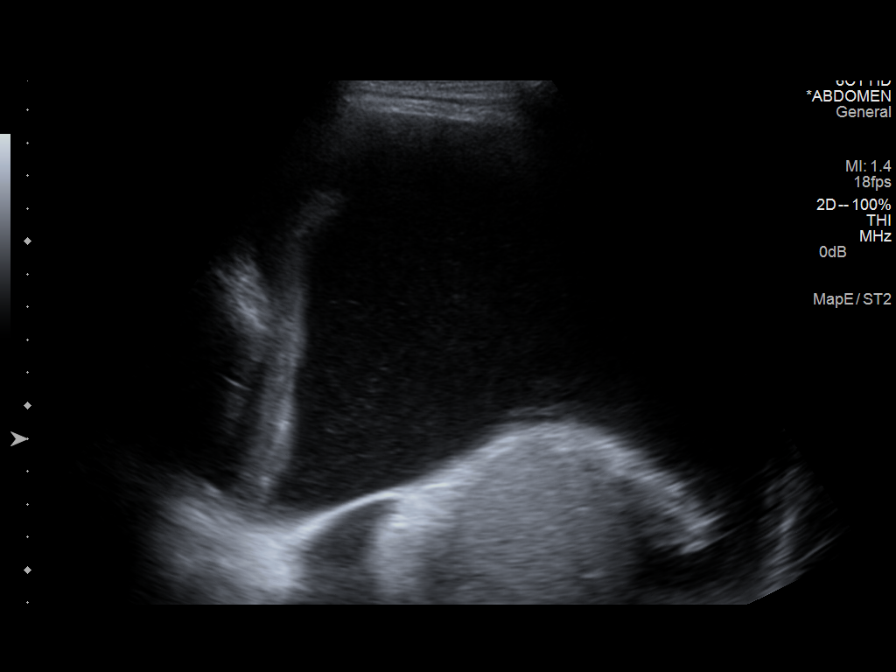
[im 2/8]
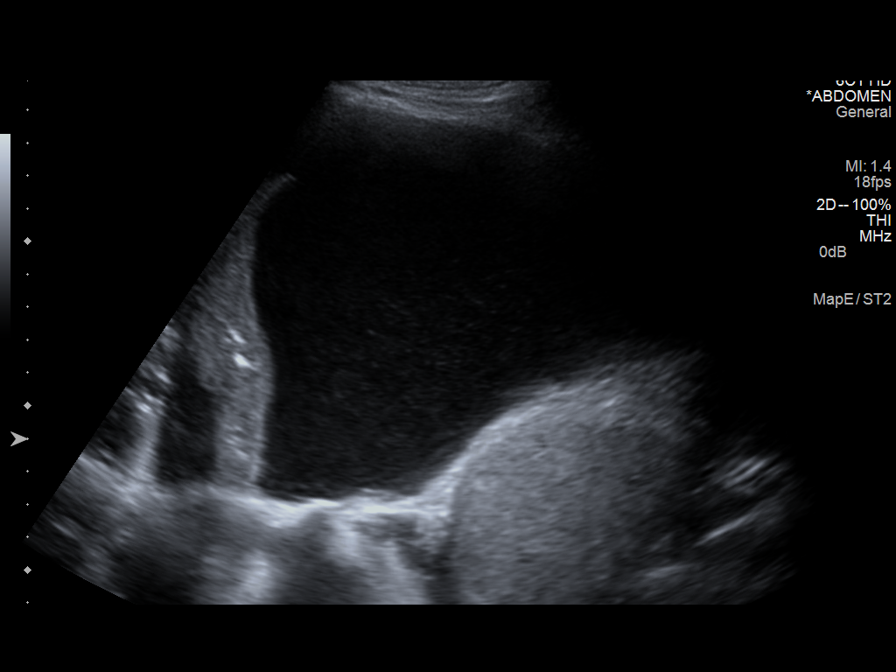
[im 3/8]
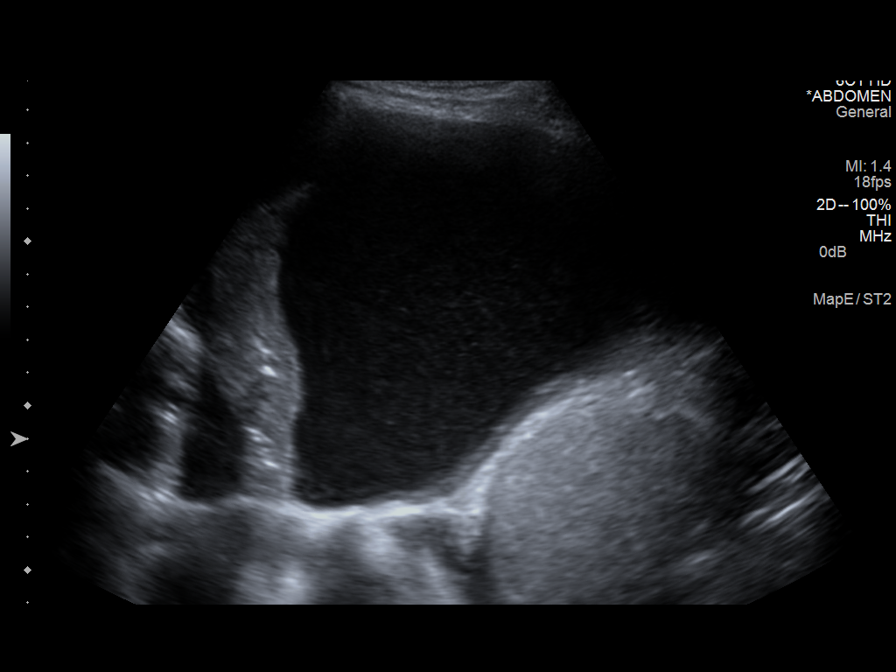
[im 4/8]
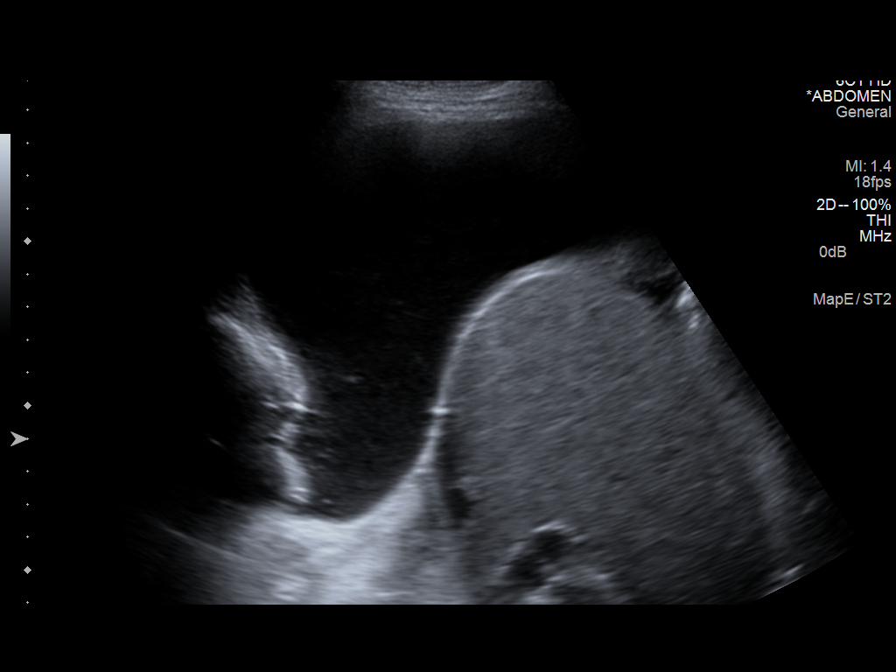
[im 5/8]
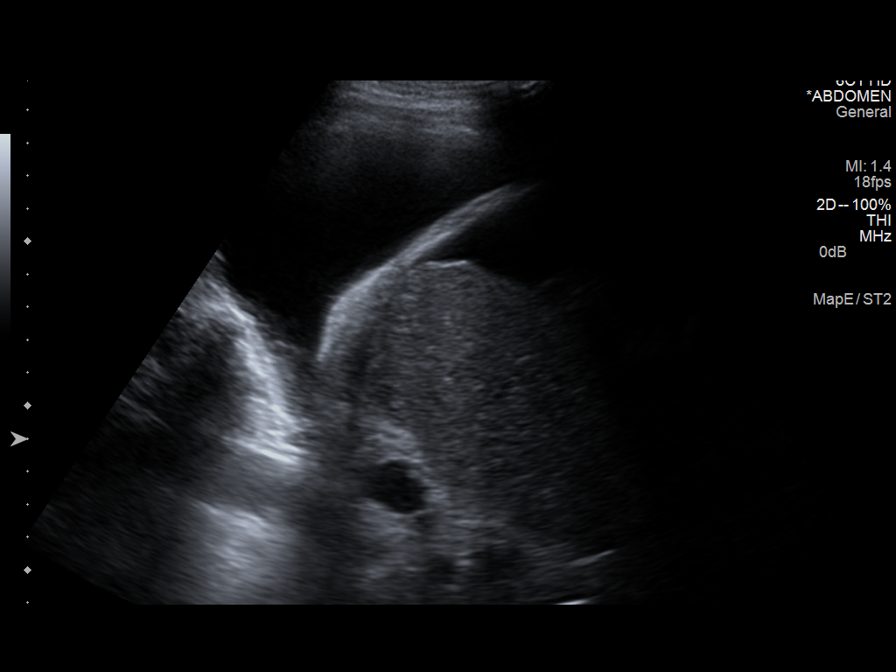
[im 6/8]
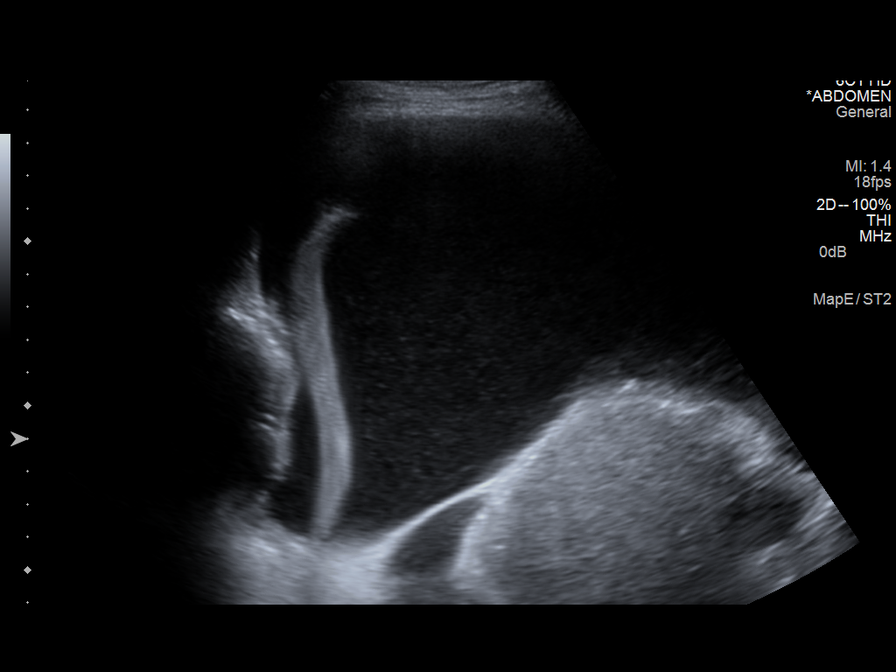
[im 7/8]
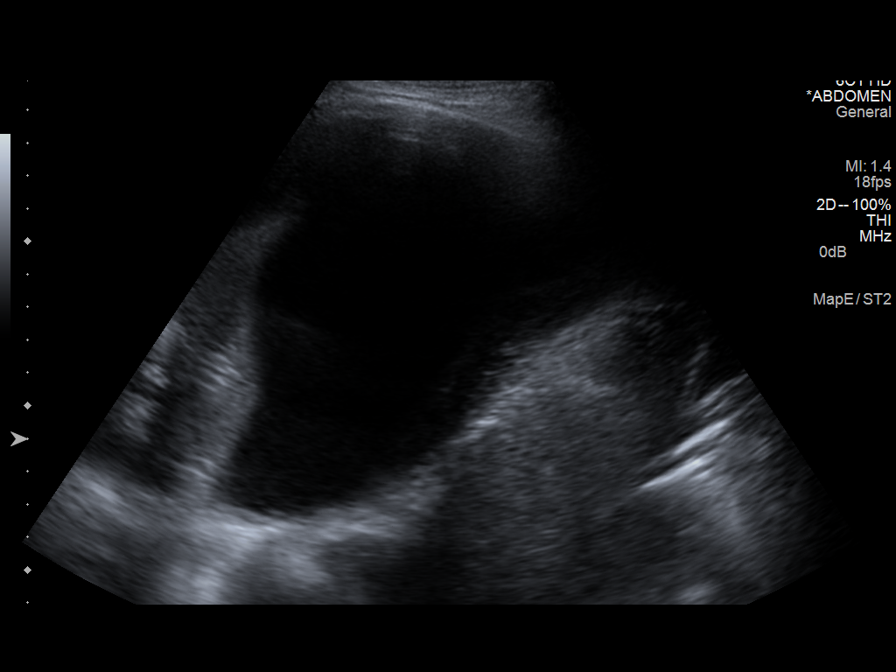
[im 8/8]
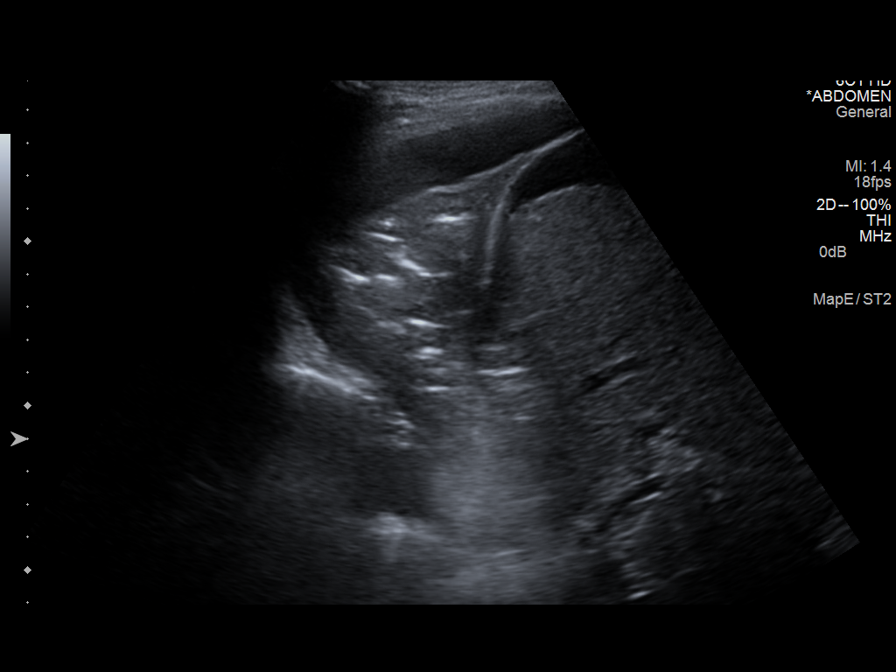

[8 of 8 positions shown; findings below may reference images not displayed]

Initial ultrasound scanning demonstrates a right pleural effusion.
The lower chest was prepped and draped in the usual sterile fashion.
1% lidocaine was used for local anesthesia.

Under direct ultrasound guidance, a 19 gauge, 7-cm, Yueh catheter
was introduced. An ultrasound image was saved for documentation
purposes. The thoracentesis was performed. The catheter was removed
and a dressing was applied. The patient tolerated the procedure well
without immediate post procedural complication. The patient was
escorted to have an upright chest radiograph.
FINDINGS: A total of approximately 1.8 liters of serous fluid was removed.
IMPRESSION: Successful ultrasound-guided right sided thoracentesis yielding
liters of pleural fluid.
